# Patient Record
Sex: Female | Born: 1970
Health system: Southern US, Community
[De-identification: ages and names within clinical notes are randomized; demographics above are authoritative.]

## PROBLEM LIST (undated history)

## (undated) DIAGNOSIS — F419 Anxiety disorder, unspecified: Secondary | ICD-10-CM

## (undated) DIAGNOSIS — M62838 Other muscle spasm: Secondary | ICD-10-CM

## (undated) DIAGNOSIS — N946 Dysmenorrhea, unspecified: Secondary | ICD-10-CM

## (undated) DIAGNOSIS — F32A Depression, unspecified: Secondary | ICD-10-CM

## (undated) DIAGNOSIS — D649 Anemia, unspecified: Secondary | ICD-10-CM

## (undated) DIAGNOSIS — K219 Gastro-esophageal reflux disease without esophagitis: Secondary | ICD-10-CM

## (undated) DIAGNOSIS — J309 Allergic rhinitis, unspecified: Secondary | ICD-10-CM

## (undated) DIAGNOSIS — D219 Benign neoplasm of connective and other soft tissue, unspecified: Secondary | ICD-10-CM

## (undated) DIAGNOSIS — G5600 Carpal tunnel syndrome, unspecified upper limb: Secondary | ICD-10-CM

## (undated) DIAGNOSIS — G988 Other disorders of nervous system: Secondary | ICD-10-CM

## (undated) DIAGNOSIS — R51 Headache: Secondary | ICD-10-CM

## (undated) DIAGNOSIS — F329 Major depressive disorder, single episode, unspecified: Secondary | ICD-10-CM

## (undated) DIAGNOSIS — R0602 Shortness of breath: Secondary | ICD-10-CM

## (undated) DIAGNOSIS — E0590 Thyrotoxicosis, unspecified without thyrotoxic crisis or storm: Secondary | ICD-10-CM

## (undated) DIAGNOSIS — N920 Excessive and frequent menstruation with regular cycle: Secondary | ICD-10-CM

## (undated) HISTORY — DX: Carpal tunnel syndrome, unspecified upper limb: G56.00

## (undated) HISTORY — DX: Excessive and frequent menstruation with regular cycle: N92.0

## (undated) HISTORY — PX: THYROID LOBECTOMY: SHX420

## (undated) HISTORY — DX: Depression, unspecified: F32.A

## (undated) HISTORY — PX: CHOLECYSTECTOMY: SHX55

## (undated) HISTORY — DX: Dysmenorrhea, unspecified: N94.6

## (undated) HISTORY — PX: BREAST BIOPSY: SHX20

## (undated) HISTORY — DX: Other muscle spasm: M62.838

## (undated) HISTORY — DX: Allergic rhinitis, unspecified: J30.9

## (undated) HISTORY — DX: Anxiety disorder, unspecified: F41.9

## (undated) HISTORY — DX: Major depressive disorder, single episode, unspecified: F32.9

## (undated) HISTORY — DX: Benign neoplasm of connective and other soft tissue, unspecified: D21.9

---

## 2008-11-21 ENCOUNTER — Encounter: Payer: Self-pay | Admitting: Maternal and Fetal Medicine

## 2009-01-05 ENCOUNTER — Encounter: Payer: Self-pay | Admitting: Obstetrics and Gynecology

## 2009-03-30 ENCOUNTER — Observation Stay: Payer: Self-pay

## 2009-04-17 ENCOUNTER — Ambulatory Visit: Payer: Self-pay | Admitting: Obstetrics and Gynecology

## 2009-04-18 ENCOUNTER — Inpatient Hospital Stay: Payer: Self-pay

## 2011-08-31 DIAGNOSIS — E785 Hyperlipidemia, unspecified: Secondary | ICD-10-CM | POA: Insufficient documentation

## 2011-10-11 ENCOUNTER — Ambulatory Visit: Payer: Self-pay | Admitting: Internal Medicine

## 2011-11-25 ENCOUNTER — Ambulatory Visit: Payer: Self-pay | Admitting: Internal Medicine

## 2011-12-16 ENCOUNTER — Ambulatory Visit: Payer: Self-pay | Admitting: Surgery

## 2011-12-26 ENCOUNTER — Other Ambulatory Visit (HOSPITAL_COMMUNITY): Payer: Self-pay | Admitting: Neurology

## 2011-12-26 DIAGNOSIS — R32 Unspecified urinary incontinence: Secondary | ICD-10-CM

## 2011-12-26 DIAGNOSIS — R200 Anesthesia of skin: Secondary | ICD-10-CM

## 2012-01-10 ENCOUNTER — Encounter (HOSPITAL_COMMUNITY): Payer: Self-pay | Admitting: Pharmacy Technician

## 2012-01-14 ENCOUNTER — Encounter (HOSPITAL_COMMUNITY)
Admission: RE | Admit: 2012-01-14 | Discharge: 2012-01-14 | Disposition: A | Discharge status: Home / Self Care | Payer: Medicare HMO | Source: Ambulatory Visit | Attending: Anesthesiology | Admitting: Anesthesiology

## 2012-01-14 ENCOUNTER — Encounter (HOSPITAL_COMMUNITY): Payer: Self-pay

## 2012-01-14 ENCOUNTER — Encounter (HOSPITAL_COMMUNITY)
Admission: RE | Admit: 2012-01-14 | Discharge: 2012-01-14 | Disposition: A | Discharge status: Home / Self Care | Payer: Medicare HMO | Source: Ambulatory Visit | Attending: Neurology | Admitting: Neurology

## 2012-01-14 DIAGNOSIS — Z01812 Encounter for preprocedural laboratory examination: Secondary | ICD-10-CM | POA: Insufficient documentation

## 2012-01-14 DIAGNOSIS — Z01818 Encounter for other preprocedural examination: Secondary | ICD-10-CM | POA: Insufficient documentation

## 2012-01-14 HISTORY — DX: Anemia, unspecified: D64.9

## 2012-01-14 HISTORY — DX: Thyrotoxicosis, unspecified without thyrotoxic crisis or storm: E05.90

## 2012-01-14 HISTORY — DX: Anxiety disorder, unspecified: F41.9

## 2012-01-14 HISTORY — DX: Depression, unspecified: F32.A

## 2012-01-14 HISTORY — DX: Gastro-esophageal reflux disease without esophagitis: K21.9

## 2012-01-14 HISTORY — DX: Shortness of breath: R06.02

## 2012-01-14 HISTORY — DX: Major depressive disorder, single episode, unspecified: F32.9

## 2012-01-14 LAB — CBC
Hemoglobin: 13.6 g/dL (ref 12.0–15.0)
MCH: 27.5 pg (ref 26.0–34.0)
RBC: 4.95 MIL/uL (ref 3.87–5.11)

## 2012-01-14 NOTE — Pre-Procedure Instructions (Signed)
[address] AMADI YOSHINO  01/14/2012   Your procedure is scheduled on: 01-16-2012  @ 11:00 AM  Report to [facility] at 9:00 AM.  Call this number if you have problems the morning of surgery: [phone]   Remember:   Do not eat food:After Midnight.  May have clear liquids: up to 4 Hours before arrival.  Clear liquids include soda, tea, black coffee, apple or grape juice, broth.5:00AM  Take these medicines the morning of surgery with A SIP OF WATER clonazepam,omeprazole   Do not wear jewelry, make-up or nail polish.  Do not wear lotions, powders, or perfumes. You may wear deodorant.  Do not shave 48 hours prior to surgery.  Do not bring valuables to the hospital.  Contacts, dentures or bridgework may not be worn into surgery.  Leave suitcase in the car. After surgery it may be brought to your room.  For patients admitted to the hospital, checkout time is 11:00 AM the day of discharge.   Patients discharged the day of surgery will not be allowed to drive home.  Name and phone number of your driver:Angela Conner   sister

## 2012-01-15 ENCOUNTER — Encounter (HOSPITAL_COMMUNITY): Payer: Self-pay | Admitting: *Deleted

## 2012-01-16 ENCOUNTER — Inpatient Hospital Stay (HOSPITAL_COMMUNITY)
Admission: RE | Admit: 2012-01-16 | Discharge: 2012-01-16 | Payer: Self-pay | Source: Ambulatory Visit | Attending: Neurology | Admitting: Neurology

## 2012-01-16 ENCOUNTER — Other Ambulatory Visit (HOSPITAL_COMMUNITY): Payer: Self-pay

## 2012-01-16 ENCOUNTER — Ambulatory Visit: Admit: 2012-01-16 | Payer: Self-pay | Admitting: General Surgery

## 2012-01-16 ENCOUNTER — Ambulatory Visit (HOSPITAL_COMMUNITY): Admission: RE | Admit: 2012-01-16 | Payer: Medicare HMO | Source: Ambulatory Visit | Admitting: Neurology

## 2012-01-16 ENCOUNTER — Encounter (HOSPITAL_COMMUNITY): Payer: Self-pay | Admitting: *Deleted

## 2012-01-16 ENCOUNTER — Encounter (HOSPITAL_COMMUNITY): Admission: RE | Payer: Self-pay | Source: Ambulatory Visit

## 2012-01-16 SURGERY — LAPAROTOMY, EXPLORATORY
Anesthesia: General

## 2012-01-16 SURGERY — RADIOLOGY WITH ANESTHESIA
Anesthesia: General

## 2012-01-16 NOTE — Preoperative (Signed)
Beta Blockers   Reason not to administer Beta Blockers:Not Applicable 

## 2012-01-29 ENCOUNTER — Ambulatory Visit: Payer: Self-pay | Admitting: Internal Medicine

## 2012-02-03 ENCOUNTER — Other Ambulatory Visit (HOSPITAL_COMMUNITY): Payer: Self-pay

## 2012-02-04 ENCOUNTER — Other Ambulatory Visit (HOSPITAL_COMMUNITY): Payer: Medicare HMO

## 2012-02-05 ENCOUNTER — Encounter (HOSPITAL_COMMUNITY)
Admission: RE | Admit: 2012-02-05 | Discharge: 2012-02-05 | Disposition: A | Discharge status: Home / Self Care | Payer: Medicare HMO | Source: Ambulatory Visit | Attending: Neurology | Admitting: Neurology

## 2012-02-05 ENCOUNTER — Encounter (HOSPITAL_COMMUNITY): Payer: Self-pay

## 2012-02-05 DIAGNOSIS — Z01812 Encounter for preprocedural laboratory examination: Secondary | ICD-10-CM | POA: Insufficient documentation

## 2012-02-05 DIAGNOSIS — Z538 Procedure and treatment not carried out for other reasons: Secondary | ICD-10-CM | POA: Insufficient documentation

## 2012-02-05 LAB — CBC
Hemoglobin: 13.6 g/dL (ref 12.0–15.0)
MCH: 27.5 pg (ref 26.0–34.0)
MCV: 83.6 fL (ref 78.0–100.0)
Platelets: 295 10*3/uL (ref 150–400)
RBC: 4.94 MIL/uL (ref 3.87–5.11)
WBC: 11.5 10*3/uL — ABNORMAL HIGH (ref 4.0–10.5)

## 2012-02-05 LAB — HCG, SERUM, QUALITATIVE: Preg, Serum: NEGATIVE

## 2012-02-06 ENCOUNTER — Inpatient Hospital Stay (HOSPITAL_COMMUNITY)
Admission: RE | Admit: 2012-02-06 | Discharge: 2012-02-06 | Payer: Medicare HMO | Source: Ambulatory Visit | Attending: Neurology | Admitting: Neurology

## 2012-02-06 ENCOUNTER — Other Ambulatory Visit (HOSPITAL_COMMUNITY): Payer: Medicare HMO

## 2012-02-06 HISTORY — DX: Headache: R51

## 2012-02-06 HISTORY — DX: Other disorders of nervous system: G98.8

## 2012-11-11 ENCOUNTER — Ambulatory Visit: Payer: Self-pay | Admitting: Obstetrics and Gynecology

## 2012-11-11 LAB — URINALYSIS, COMPLETE
Glucose,UR: NEGATIVE mg/dL (ref 0–75)
Ketone: NEGATIVE
Nitrite: NEGATIVE
Specific Gravity: 1.016 (ref 1.003–1.030)
WBC UR: 1 /HPF (ref 0–5)

## 2012-11-11 LAB — PREGNANCY, URINE: Pregnancy Test, Urine: NEGATIVE m[IU]/mL

## 2012-11-11 LAB — CBC
HGB: 14.3 g/dL (ref 12.0–16.0)
MCH: 29 pg (ref 26.0–34.0)
MCHC: 34 g/dL (ref 32.0–36.0)
MCV: 85 fL (ref 80–100)
RBC: 4.92 10*6/uL (ref 3.80–5.20)

## 2012-12-22 DIAGNOSIS — N912 Amenorrhea, unspecified: Secondary | ICD-10-CM | POA: Insufficient documentation

## 2013-01-13 DIAGNOSIS — R197 Diarrhea, unspecified: Secondary | ICD-10-CM | POA: Insufficient documentation

## 2013-01-13 DIAGNOSIS — R1110 Vomiting, unspecified: Secondary | ICD-10-CM | POA: Insufficient documentation

## 2013-01-15 ENCOUNTER — Other Ambulatory Visit: Payer: Self-pay | Admitting: Nurse Practitioner

## 2013-01-16 LAB — CLOSTRIDIUM DIFFICILE BY PCR

## 2013-03-11 DIAGNOSIS — R319 Hematuria, unspecified: Secondary | ICD-10-CM | POA: Insufficient documentation

## 2013-03-11 DIAGNOSIS — N390 Urinary tract infection, site not specified: Secondary | ICD-10-CM | POA: Insufficient documentation

## 2013-07-06 ENCOUNTER — Ambulatory Visit: Payer: Self-pay | Admitting: Internal Medicine

## 2013-07-23 DIAGNOSIS — J309 Allergic rhinitis, unspecified: Secondary | ICD-10-CM | POA: Insufficient documentation

## 2013-07-23 DIAGNOSIS — R062 Wheezing: Secondary | ICD-10-CM | POA: Insufficient documentation

## 2013-11-18 ENCOUNTER — Ambulatory Visit: Payer: Self-pay | Admitting: Neurology

## 2014-05-26 LAB — HM PAP SMEAR: HM Pap smear: NEGATIVE

## 2014-08-18 ENCOUNTER — Ambulatory Visit: Payer: Self-pay | Admitting: Obstetrics and Gynecology

## 2014-08-18 LAB — HM MAMMOGRAPHY

## 2014-08-23 DIAGNOSIS — G44219 Episodic tension-type headache, not intractable: Secondary | ICD-10-CM | POA: Insufficient documentation

## 2015-01-16 DIAGNOSIS — N920 Excessive and frequent menstruation with regular cycle: Secondary | ICD-10-CM | POA: Diagnosis not present

## 2015-02-07 DIAGNOSIS — F331 Major depressive disorder, recurrent, moderate: Secondary | ICD-10-CM | POA: Diagnosis not present

## 2015-03-06 DIAGNOSIS — R5382 Chronic fatigue, unspecified: Secondary | ICD-10-CM | POA: Diagnosis not present

## 2015-03-06 DIAGNOSIS — R5381 Other malaise: Secondary | ICD-10-CM | POA: Diagnosis not present

## 2015-03-06 DIAGNOSIS — K219 Gastro-esophageal reflux disease without esophagitis: Secondary | ICD-10-CM | POA: Diagnosis not present

## 2015-03-14 DIAGNOSIS — F331 Major depressive disorder, recurrent, moderate: Secondary | ICD-10-CM | POA: Diagnosis not present

## 2015-04-04 DIAGNOSIS — N920 Excessive and frequent menstruation with regular cycle: Secondary | ICD-10-CM | POA: Diagnosis not present

## 2015-04-11 DIAGNOSIS — F331 Major depressive disorder, recurrent, moderate: Secondary | ICD-10-CM | POA: Diagnosis not present

## 2015-04-13 DIAGNOSIS — F329 Major depressive disorder, single episode, unspecified: Secondary | ICD-10-CM | POA: Diagnosis not present

## 2015-04-13 DIAGNOSIS — R635 Abnormal weight gain: Secondary | ICD-10-CM | POA: Diagnosis not present

## 2015-04-13 DIAGNOSIS — F419 Anxiety disorder, unspecified: Secondary | ICD-10-CM | POA: Diagnosis not present

## 2015-04-13 DIAGNOSIS — G5600 Carpal tunnel syndrome, unspecified upper limb: Secondary | ICD-10-CM | POA: Diagnosis not present

## 2015-04-20 DIAGNOSIS — R350 Frequency of micturition: Secondary | ICD-10-CM | POA: Diagnosis not present

## 2015-04-20 DIAGNOSIS — G44211 Episodic tension-type headache, intractable: Secondary | ICD-10-CM | POA: Diagnosis not present

## 2015-04-20 DIAGNOSIS — G5601 Carpal tunnel syndrome, right upper limb: Secondary | ICD-10-CM | POA: Diagnosis not present

## 2015-04-20 DIAGNOSIS — G5602 Carpal tunnel syndrome, left upper limb: Secondary | ICD-10-CM | POA: Diagnosis not present

## 2015-05-12 DIAGNOSIS — G44211 Episodic tension-type headache, intractable: Secondary | ICD-10-CM | POA: Diagnosis not present

## 2015-05-19 DIAGNOSIS — G5602 Carpal tunnel syndrome, left upper limb: Secondary | ICD-10-CM | POA: Diagnosis not present

## 2015-05-19 DIAGNOSIS — M5481 Occipital neuralgia: Secondary | ICD-10-CM | POA: Diagnosis not present

## 2015-05-19 DIAGNOSIS — G44211 Episodic tension-type headache, intractable: Secondary | ICD-10-CM | POA: Diagnosis not present

## 2015-05-19 DIAGNOSIS — G5601 Carpal tunnel syndrome, right upper limb: Secondary | ICD-10-CM | POA: Diagnosis not present

## 2015-05-23 DIAGNOSIS — F331 Major depressive disorder, recurrent, moderate: Secondary | ICD-10-CM | POA: Diagnosis not present

## 2015-06-08 DIAGNOSIS — F331 Major depressive disorder, recurrent, moderate: Secondary | ICD-10-CM | POA: Diagnosis not present

## 2015-06-19 DIAGNOSIS — R5383 Other fatigue: Secondary | ICD-10-CM | POA: Diagnosis not present

## 2015-06-19 DIAGNOSIS — L989 Disorder of the skin and subcutaneous tissue, unspecified: Secondary | ICD-10-CM | POA: Diagnosis not present

## 2015-06-19 DIAGNOSIS — L049 Acute lymphadenitis, unspecified: Secondary | ICD-10-CM | POA: Diagnosis not present

## 2015-06-19 DIAGNOSIS — J449 Chronic obstructive pulmonary disease, unspecified: Secondary | ICD-10-CM | POA: Diagnosis not present

## 2015-06-20 ENCOUNTER — Encounter: Payer: Self-pay | Admitting: Obstetrics and Gynecology

## 2015-06-20 ENCOUNTER — Ambulatory Visit (INDEPENDENT_AMBULATORY_CARE_PROVIDER_SITE_OTHER): Payer: Medicare Other | Admitting: Obstetrics and Gynecology

## 2015-06-20 VITALS — BP 113/75 | HR 111 | Ht 67.5 in | Wt 231.1 lb

## 2015-06-20 DIAGNOSIS — R638 Other symptoms and signs concerning food and fluid intake: Secondary | ICD-10-CM

## 2015-06-20 DIAGNOSIS — Z1231 Encounter for screening mammogram for malignant neoplasm of breast: Secondary | ICD-10-CM | POA: Diagnosis not present

## 2015-06-20 DIAGNOSIS — N921 Excessive and frequent menstruation with irregular cycle: Secondary | ICD-10-CM | POA: Diagnosis not present

## 2015-06-20 DIAGNOSIS — Z01419 Encounter for gynecological examination (general) (routine) without abnormal findings: Secondary | ICD-10-CM

## 2015-06-20 DIAGNOSIS — Z720 Tobacco use: Secondary | ICD-10-CM

## 2015-06-20 MED ORDER — MEDROXYPROGESTERONE ACETATE 150 MG/ML IM SUSP
150.0000 mg | Freq: Once | INTRAMUSCULAR | Status: AC
  Administered 2015-06-20: 150 mg via INTRAMUSCULAR

## 2015-06-20 MED ORDER — MEDROXYPROGESTERONE ACETATE 150 MG/ML IM SUSP: 150.0000 mg | INTRAMUSCULAR | Status: DC

## 2015-06-20 NOTE — Progress Notes (Signed)
Patient ID: Mary Koch, female   DOB: 1971/04/28, 44 y.o.   MRN: [id number]  ANNUAL PREVENTATIVE CARE GYN  ENCOUNTER NOTE  Subjective:       Mary Koch is a 44 y.o. No obstetric history on file. female here for a routine annual gynecologic exam.  Current complaints: 1.  none   Gynecologic History No LMP recorded. Patient has had an injection. Contraception: tubal ligation Last Pap: 05/26/2014. Results were: normal Last mammogram: 08/18/2014. Results were: normal  Obstetric History OB History  No data available    Past Medical History  Diagnosis Date  . Shortness of breath     when i smoke a lot  . Anxiety   . Depression   . GERD (gastroesophageal reflux disease)   . Anemia   . Neurological disorder     evaluation for ms  . Headache(784.0)   . Headache(784.0)   . Anxiety and depression   . Painful menstrual periods   . Hyperthyroidism     right portion of thyroid removed  . Heavy periods   . Allergic rhinitis   . Carpal tunnel syndrome   . Muscle spasm   . Fibroids     Past Surgical History  Procedure Laterality Date  . Thyroid lobectomy    . Cesarean section      times 2  . Cholecystectomy      Current Outpatient Prescriptions on File Prior to Visit  Medication Sig Dispense Refill  . clonazePAM (KLONOPIN) 1 MG tablet Take 1 mg by mouth 2 (two) times daily.    . cyclobenzaprine (FLEXERIL) 10 MG tablet Take 10 mg by mouth 3 (three) times daily as needed for muscle spasms.    Marland Kitchen gabapentin (NEURONTIN) 600 MG tablet Take 600 mg by mouth 3 (three) times daily.    . Garcinia Cambogia-Chromium 500-200 MG-MCG TABS Take 1 tablet by mouth daily.    Marland Kitchen loratadine (CLARITIN) 10 MG tablet Take 10 mg by mouth daily.    . naproxen (NAPROSYN) 500 MG tablet Take 500 mg by mouth 2 (two) times daily with a meal.    . omeprazole (PRILOSEC) 40 MG capsule Take 40 mg by mouth daily.    . QUEtiapine (SEROQUEL) 25 MG tablet Take 25 mg by mouth at bedtime.    . sertraline  (ZOLOFT) 100 MG tablet Take 100 mg by mouth daily.    Marland Kitchen tiZANidine (ZANAFLEX) 4 MG capsule Take 4 mg by mouth at bedtime.    . vitamin E (VITAMIN E) 400 UNIT capsule Take 800 Units by mouth daily.     No current facility-administered medications on file prior to visit.    Allergies  Allergen Reactions  . Azithromycin Other (See Comments)    headache  . Codeine Other (See Comments)    unknown  . Penicillins Other (See Comments)    headaches    History   Social History  . Marital Status: Legally Separated    Spouse Name: N/A  . Number of Children: N/A  . Years of Education: N/A   Occupational History  . Not on file.   Social History Main Topics  . Smoking status: Current Every Day Smoker -- 2.00 packs/day for 25 years    Types: Cigarettes  . Smokeless tobacco: Not on file  . Alcohol Use: No  . Drug Use: No  . Sexual Activity: Yes    Birth Control/ Protection: Surgical   Other Topics Concern  . Not on file   Social History Narrative  No family history on file.  The following portions of the patient's history were reviewed and updated as appropriate: allergies, current medications, past family history, past medical history, past social history, past surgical history and problem list.  Review of Systems ROS Review of Systems - General ROS: negative for - chills, fatigue, fever, hot flashes, night sweats, weight gain or weight loss Psychological ROS: negative for - anxiety, decreased libido, depression, mood swings, physical abuse or sexual abuse Ophthalmic ROS: negative for - blurry vision, eye pain or loss of vision ENT ROS: negative for - headaches, hearing change, visual changes or vocal changes Allergy and Immunology ROS: negative for - hives, itchy/watery eyes or seasonal allergies Hematological and Lymphatic ROS: negative for - bleeding problems, bruising, swollen lymph nodes or weight loss Endocrine ROS: negative for - galactorrhea, hair pattern changes, hot  flashes, malaise/lethargy, mood swings, palpitations, polydipsia/polyuria, skin changes, temperature intolerance or unexpected weight changes Breast ROS: negative for - new or changing breast lumps or nipple discharge Respiratory ROS: negative for - cough or shortness of breath Cardiovascular ROS: negative for - chest pain, irregular heartbeat, palpitations or shortness of breath Gastrointestinal ROS: no abdominal pain, change in bowel habits, or black or bloody stools Genito-Urinary ROS: no dysuria, trouble voiding, or hematuria Musculoskeletal ROS: negative for - joint pain or joint stiffness Neurological ROS: negative for - bowel and bladder control changes Dermatological ROS: negative for rash and skin lesion changes   Objective:   BP 113/75 mmHg  Pulse 111  Ht 5' 7.5" (1.715 m)  Wt 231 lb 1.6 oz (104.826 kg)  BMI 35.64 kg/m2 CONSTITUTIONAL: Well-developed, well-nourished female in no acute distress.  PSYCHIATRIC: Normal mood and affect. Normal behavior. Normal judgment and thought content. [place]: Alert and oriented to person, place, and time. Normal muscle tone coordination. No cranial nerve deficit noted. HENT:  Normocephalic, atraumatic, External right and left ear normal. Oropharynx is clear and moist EYES: Conjunctivae and EOM are normal. Pupils are equal, round, and reactive to light. No scleral icterus.  NECK: Normal range of motion, supple, no masses.  Normal thyroid.  SKIN: Skin is warm and dry. No rash noted. Not diaphoretic. No erythema. No pallor. CARDIOVASCULAR: Normal heart rate noted, regular rhythm, no murmur. RESPIRATORY: Clear to auscultation bilaterally. Effort and breath sounds normal, no problems with respiration noted. BREASTS: Symmetric in size. No masses, skin changes, nipple drainage, or lymphadenopathy. ABDOMEN: Soft, normal bowel sounds, no distention noted.  No tenderness, rebound or guarding.  BLADDER: Normal PELVIC:  External Genitalia:  Normal  BUS: Normal  Vagina: Normal  Cervix: Normal  Uterus: Normal  Adnexa: Normal  RV: External Exam NormaI, No Rectal Masses and Normal Sphincter tone  MUSCULOSKELETAL: Normal range of motion. No tenderness.  No cyanosis, clubbing, or edema.  2+ distal pulses. LYMPHATIC: No Axillary, Supraclavicular, or Inguinal Adenopathy.    Assessment:   Annual gynecologic examination 44 y.o. Contraception: tubal ligation Increased BMI Tobacco User Anxiety-controlled  Plan:  Pap: Co-test Mammogram: ordered Stool Guaiac Testing: not indicated Labs: per pcp Routine preventative health maintenance measures emphasized: Exercise/diet/wt loss encouraged. Smoking Cessation encouraged.  Continue Depo provera for cycle suppression. Return to [place], [place]   Brayton Mars, MD

## 2015-06-25 ENCOUNTER — Encounter: Payer: Self-pay | Admitting: Obstetrics and Gynecology

## 2015-06-25 DIAGNOSIS — N6019 Diffuse cystic mastopathy of unspecified breast: Secondary | ICD-10-CM | POA: Insufficient documentation

## 2015-06-25 DIAGNOSIS — F17200 Nicotine dependence, unspecified, uncomplicated: Secondary | ICD-10-CM | POA: Insufficient documentation

## 2015-06-25 DIAGNOSIS — K219 Gastro-esophageal reflux disease without esophagitis: Secondary | ICD-10-CM | POA: Insufficient documentation

## 2015-06-25 DIAGNOSIS — G5600 Carpal tunnel syndrome, unspecified upper limb: Secondary | ICD-10-CM | POA: Insufficient documentation

## 2015-06-25 DIAGNOSIS — E079 Disorder of thyroid, unspecified: Secondary | ICD-10-CM | POA: Insufficient documentation

## 2015-06-25 DIAGNOSIS — D219 Benign neoplasm of connective and other soft tissue, unspecified: Secondary | ICD-10-CM | POA: Insufficient documentation

## 2015-06-25 DIAGNOSIS — E669 Obesity, unspecified: Secondary | ICD-10-CM | POA: Insufficient documentation

## 2015-06-25 DIAGNOSIS — N920 Excessive and frequent menstruation with regular cycle: Secondary | ICD-10-CM | POA: Insufficient documentation

## 2015-06-25 DIAGNOSIS — F329 Major depressive disorder, single episode, unspecified: Secondary | ICD-10-CM | POA: Insufficient documentation

## 2015-06-25 DIAGNOSIS — F419 Anxiety disorder, unspecified: Secondary | ICD-10-CM | POA: Insufficient documentation

## 2015-06-25 DIAGNOSIS — Z3009 Encounter for other general counseling and advice on contraception: Secondary | ICD-10-CM | POA: Insufficient documentation

## 2015-06-25 DIAGNOSIS — Z6841 Body Mass Index (BMI) 40.0 and over, adult: Secondary | ICD-10-CM | POA: Insufficient documentation

## 2015-06-25 DIAGNOSIS — R638 Other symptoms and signs concerning food and fluid intake: Secondary | ICD-10-CM | POA: Insufficient documentation

## 2015-06-25 DIAGNOSIS — N946 Dysmenorrhea, unspecified: Secondary | ICD-10-CM | POA: Insufficient documentation

## 2015-06-26 DIAGNOSIS — K219 Gastro-esophageal reflux disease without esophagitis: Secondary | ICD-10-CM | POA: Diagnosis not present

## 2015-07-10 DIAGNOSIS — F331 Major depressive disorder, recurrent, moderate: Secondary | ICD-10-CM | POA: Diagnosis not present

## 2015-07-25 DIAGNOSIS — F331 Major depressive disorder, recurrent, moderate: Secondary | ICD-10-CM | POA: Diagnosis not present

## 2015-08-11 DIAGNOSIS — S63512S Sprain of carpal joint of left wrist, sequela: Secondary | ICD-10-CM | POA: Diagnosis not present

## 2015-08-11 DIAGNOSIS — F419 Anxiety disorder, unspecified: Secondary | ICD-10-CM | POA: Diagnosis not present

## 2015-08-11 DIAGNOSIS — F329 Major depressive disorder, single episode, unspecified: Secondary | ICD-10-CM | POA: Diagnosis not present

## 2015-08-11 DIAGNOSIS — G5600 Carpal tunnel syndrome, unspecified upper limb: Secondary | ICD-10-CM | POA: Diagnosis not present

## 2015-08-15 DIAGNOSIS — F331 Major depressive disorder, recurrent, moderate: Secondary | ICD-10-CM | POA: Diagnosis not present

## 2015-08-28 DIAGNOSIS — G5602 Carpal tunnel syndrome, left upper limb: Secondary | ICD-10-CM | POA: Diagnosis not present

## 2015-08-28 DIAGNOSIS — G5601 Carpal tunnel syndrome, right upper limb: Secondary | ICD-10-CM | POA: Diagnosis not present

## 2015-08-29 DIAGNOSIS — F331 Major depressive disorder, recurrent, moderate: Secondary | ICD-10-CM | POA: Diagnosis not present

## 2015-09-05 ENCOUNTER — Ambulatory Visit (INDEPENDENT_AMBULATORY_CARE_PROVIDER_SITE_OTHER): Payer: Commercial Managed Care - HMO | Admitting: Obstetrics and Gynecology

## 2015-09-05 VITALS — BP 115/76 | HR 96 | Ht 67.5 in | Wt 237.0 lb

## 2015-09-05 DIAGNOSIS — N921 Excessive and frequent menstruation with irregular cycle: Secondary | ICD-10-CM | POA: Diagnosis not present

## 2015-09-05 MED ORDER — MEDROXYPROGESTERONE ACETATE 150 MG/ML IM SUSP
150.0000 mg | Freq: Once | INTRAMUSCULAR | Status: AC
  Administered 2015-09-05: 150 mg via INTRAMUSCULAR

## 2015-09-05 NOTE — Progress Notes (Signed)
Patient ID: Mary Koch, female   DOB: [birth year], 44 y.o.   MRN: [id number]  Pt presents for depo-provera injection for cyclic suppression. Pt happy with this, no vaginal bleeding. No c/o undesired side effects of medication.

## 2015-09-15 DIAGNOSIS — G5602 Carpal tunnel syndrome, left upper limb: Secondary | ICD-10-CM | POA: Diagnosis not present

## 2015-09-29 DIAGNOSIS — F329 Major depressive disorder, single episode, unspecified: Secondary | ICD-10-CM | POA: Diagnosis not present

## 2015-09-29 DIAGNOSIS — E669 Obesity, unspecified: Secondary | ICD-10-CM | POA: Diagnosis not present

## 2015-09-29 DIAGNOSIS — F419 Anxiety disorder, unspecified: Secondary | ICD-10-CM | POA: Diagnosis not present

## 2015-10-02 DIAGNOSIS — Z23 Encounter for immunization: Secondary | ICD-10-CM | POA: Diagnosis not present

## 2015-10-19 DIAGNOSIS — E8881 Metabolic syndrome: Secondary | ICD-10-CM | POA: Diagnosis not present

## 2015-10-19 DIAGNOSIS — E663 Overweight: Secondary | ICD-10-CM | POA: Diagnosis not present

## 2015-11-09 DIAGNOSIS — F209 Schizophrenia, unspecified: Secondary | ICD-10-CM | POA: Diagnosis not present

## 2015-11-21 ENCOUNTER — Ambulatory Visit (INDEPENDENT_AMBULATORY_CARE_PROVIDER_SITE_OTHER): Payer: Commercial Managed Care - HMO

## 2015-11-21 VITALS — BP 103/81 | HR 95 | Ht 67.5 in | Wt 232.3 lb

## 2015-11-21 DIAGNOSIS — N921 Excessive and frequent menstruation with irregular cycle: Secondary | ICD-10-CM | POA: Diagnosis not present

## 2015-11-21 MED ORDER — MEDROXYPROGESTERONE ACETATE 150 MG/ML IM SUSP
150.0000 mg | Freq: Once | INTRAMUSCULAR | Status: AC
  Administered 2015-11-21: 150 mg via INTRAMUSCULAR

## 2015-11-30 DIAGNOSIS — E8881 Metabolic syndrome: Secondary | ICD-10-CM | POA: Diagnosis not present

## 2015-11-30 DIAGNOSIS — F329 Major depressive disorder, single episode, unspecified: Secondary | ICD-10-CM | POA: Diagnosis not present

## 2015-11-30 DIAGNOSIS — E669 Obesity, unspecified: Secondary | ICD-10-CM | POA: Diagnosis not present

## 2015-11-30 DIAGNOSIS — F419 Anxiety disorder, unspecified: Secondary | ICD-10-CM | POA: Diagnosis not present

## 2015-12-01 DIAGNOSIS — F320 Major depressive disorder, single episode, mild: Secondary | ICD-10-CM | POA: Diagnosis not present

## 2015-12-01 DIAGNOSIS — K219 Gastro-esophageal reflux disease without esophagitis: Secondary | ICD-10-CM | POA: Diagnosis not present

## 2015-12-01 DIAGNOSIS — Z6835 Body mass index (BMI) 35.0-35.9, adult: Secondary | ICD-10-CM | POA: Diagnosis not present

## 2015-12-01 DIAGNOSIS — E782 Mixed hyperlipidemia: Secondary | ICD-10-CM | POA: Diagnosis not present

## 2015-12-01 DIAGNOSIS — Z0000 Encounter for general adult medical examination without abnormal findings: Secondary | ICD-10-CM | POA: Diagnosis not present

## 2015-12-01 DIAGNOSIS — G43909 Migraine, unspecified, not intractable, without status migrainosus: Secondary | ICD-10-CM | POA: Diagnosis not present

## 2015-12-01 DIAGNOSIS — J45909 Unspecified asthma, uncomplicated: Secondary | ICD-10-CM | POA: Diagnosis not present

## 2015-12-07 DIAGNOSIS — F331 Major depressive disorder, recurrent, moderate: Secondary | ICD-10-CM | POA: Diagnosis not present

## 2015-12-07 DIAGNOSIS — F410 Panic disorder [episodic paroxysmal anxiety] without agoraphobia: Secondary | ICD-10-CM | POA: Diagnosis not present

## 2015-12-31 HISTORY — PX: OTHER SURGICAL HISTORY: SHX169

## 2016-01-02 DIAGNOSIS — F419 Anxiety disorder, unspecified: Secondary | ICD-10-CM | POA: Diagnosis not present

## 2016-01-02 DIAGNOSIS — F329 Major depressive disorder, single episode, unspecified: Secondary | ICD-10-CM | POA: Diagnosis not present

## 2016-01-02 DIAGNOSIS — R635 Abnormal weight gain: Secondary | ICD-10-CM | POA: Diagnosis not present

## 2016-01-02 DIAGNOSIS — E785 Hyperlipidemia, unspecified: Secondary | ICD-10-CM | POA: Diagnosis not present

## 2016-01-22 DIAGNOSIS — F331 Major depressive disorder, recurrent, moderate: Secondary | ICD-10-CM | POA: Diagnosis not present

## 2016-01-30 DIAGNOSIS — F331 Major depressive disorder, recurrent, moderate: Secondary | ICD-10-CM | POA: Diagnosis not present

## 2016-01-30 DIAGNOSIS — F410 Panic disorder [episodic paroxysmal anxiety] without agoraphobia: Secondary | ICD-10-CM | POA: Diagnosis not present

## 2016-02-06 ENCOUNTER — Ambulatory Visit (INDEPENDENT_AMBULATORY_CARE_PROVIDER_SITE_OTHER): Payer: Commercial Managed Care - HMO

## 2016-02-06 VITALS — BP 126/86 | HR 111 | Wt 244.3 lb

## 2016-02-06 DIAGNOSIS — N921 Excessive and frequent menstruation with irregular cycle: Secondary | ICD-10-CM | POA: Diagnosis not present

## 2016-02-06 MED ORDER — MEDROXYPROGESTERONE ACETATE 150 MG/ML IM SUSP
150.0000 mg | Freq: Once | INTRAMUSCULAR | Status: AC
  Administered 2016-02-06: 150 mg via INTRAMUSCULAR

## 2016-02-06 NOTE — Progress Notes (Signed)
Patient ID: Mary Koch, female   DOB: 1971/11/05, 45 y.o.   MRN: [id number] Pt presents for medroxyprogesterone 150 mg injection for menorrhagia with irregular cycle. No menses "in a long time".  Denies any problems with medication. Pt weight gain from last visit was 12 lbs. Pt does not think it is the depo-provera since she has been on it for so long. Unsure if this may be related to eating habits.

## 2016-02-15 DIAGNOSIS — F331 Major depressive disorder, recurrent, moderate: Secondary | ICD-10-CM | POA: Diagnosis not present

## 2016-02-15 DIAGNOSIS — F410 Panic disorder [episodic paroxysmal anxiety] without agoraphobia: Secondary | ICD-10-CM | POA: Diagnosis not present

## 2016-02-19 DIAGNOSIS — F410 Panic disorder [episodic paroxysmal anxiety] without agoraphobia: Secondary | ICD-10-CM | POA: Diagnosis not present

## 2016-02-19 DIAGNOSIS — F331 Major depressive disorder, recurrent, moderate: Secondary | ICD-10-CM | POA: Diagnosis not present

## 2016-02-20 DIAGNOSIS — G43119 Migraine with aura, intractable, without status migrainosus: Secondary | ICD-10-CM | POA: Diagnosis not present

## 2016-02-20 DIAGNOSIS — G5603 Carpal tunnel syndrome, bilateral upper limbs: Secondary | ICD-10-CM | POA: Diagnosis not present

## 2016-03-05 DIAGNOSIS — F410 Panic disorder [episodic paroxysmal anxiety] without agoraphobia: Secondary | ICD-10-CM | POA: Diagnosis not present

## 2016-03-05 DIAGNOSIS — F331 Major depressive disorder, recurrent, moderate: Secondary | ICD-10-CM | POA: Diagnosis not present

## 2016-03-08 DIAGNOSIS — Z0000 Encounter for general adult medical examination without abnormal findings: Secondary | ICD-10-CM | POA: Diagnosis not present

## 2016-03-19 DIAGNOSIS — L700 Acne vulgaris: Secondary | ICD-10-CM | POA: Diagnosis not present

## 2016-03-20 DIAGNOSIS — F331 Major depressive disorder, recurrent, moderate: Secondary | ICD-10-CM | POA: Diagnosis not present

## 2016-04-04 DIAGNOSIS — F331 Major depressive disorder, recurrent, moderate: Secondary | ICD-10-CM | POA: Diagnosis not present

## 2016-04-05 DIAGNOSIS — F329 Major depressive disorder, single episode, unspecified: Secondary | ICD-10-CM | POA: Diagnosis not present

## 2016-04-05 DIAGNOSIS — R638 Other symptoms and signs concerning food and fluid intake: Secondary | ICD-10-CM | POA: Diagnosis not present

## 2016-05-07 ENCOUNTER — Ambulatory Visit: Payer: Commercial Managed Care - HMO | Admitting: Obstetrics and Gynecology

## 2016-05-07 ENCOUNTER — Ambulatory Visit (INDEPENDENT_AMBULATORY_CARE_PROVIDER_SITE_OTHER): Payer: Commercial Managed Care - HMO

## 2016-05-07 VITALS — BP 114/82 | HR 104 | Wt 247.0 lb

## 2016-05-07 DIAGNOSIS — N921 Excessive and frequent menstruation with irregular cycle: Secondary | ICD-10-CM | POA: Diagnosis not present

## 2016-05-07 MED ORDER — MEDROXYPROGESTERONE ACETATE 150 MG/ML IM SUSP
150.0000 mg | Freq: Once | INTRAMUSCULAR | Status: AC
  Administered 2016-05-07: 150 mg via INTRAMUSCULAR

## 2016-05-07 NOTE — Progress Notes (Signed)
Patient ID: Mary Koch, female   DOB: 1971-04-21, 45 y.o.   MRN: [id number] Pt presents for depo-provera injection. No c/o side effects. Medication is helping with her vaginal bleeding.

## 2016-05-14 DIAGNOSIS — F329 Major depressive disorder, single episode, unspecified: Secondary | ICD-10-CM | POA: Diagnosis not present

## 2016-05-14 DIAGNOSIS — F419 Anxiety disorder, unspecified: Secondary | ICD-10-CM | POA: Diagnosis not present

## 2016-05-14 DIAGNOSIS — J302 Other seasonal allergic rhinitis: Secondary | ICD-10-CM | POA: Diagnosis not present

## 2016-05-22 ENCOUNTER — Encounter: Payer: Self-pay | Admitting: Psychiatry

## 2016-05-22 ENCOUNTER — Ambulatory Visit (INDEPENDENT_AMBULATORY_CARE_PROVIDER_SITE_OTHER): Payer: Commercial Managed Care - HMO | Admitting: Psychiatry

## 2016-05-22 VITALS — BP 136/72 | HR 111 | Temp 97.8°F | Ht 67.5 in | Wt 243.0 lb

## 2016-05-22 DIAGNOSIS — F331 Major depressive disorder, recurrent, moderate: Secondary | ICD-10-CM

## 2016-05-22 DIAGNOSIS — F4001 Agoraphobia with panic disorder: Secondary | ICD-10-CM | POA: Diagnosis not present

## 2016-05-22 MED ORDER — TOPIRAMATE 50 MG PO TABS: 50.0000 mg | ORAL_TABLET | Freq: Every day | ORAL | Status: DC

## 2016-05-22 MED ORDER — TRAZODONE HCL 100 MG PO TABS: 100.0000 mg | ORAL_TABLET | Freq: Every evening | ORAL | Status: DC | PRN

## 2016-05-22 MED ORDER — DULOXETINE HCL 30 MG PO CPEP: 30.0000 mg | ORAL_CAPSULE | Freq: Two times a day (BID) | ORAL | Status: DC
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
## 2016-06-04 DIAGNOSIS — L700 Acne vulgaris: Secondary | ICD-10-CM | POA: Diagnosis not present

## 2016-06-06 ENCOUNTER — Ambulatory Visit (INDEPENDENT_AMBULATORY_CARE_PROVIDER_SITE_OTHER): Payer: Commercial Managed Care - HMO | Admitting: Psychiatry

## 2016-06-06 ENCOUNTER — Encounter: Payer: Self-pay | Admitting: Psychiatry

## 2016-06-06 VITALS — BP 119/83 | HR 92 | Temp 97.8°F | Ht 67.13 in | Wt 246.2 lb

## 2016-06-06 DIAGNOSIS — F4001 Agoraphobia with panic disorder: Secondary | ICD-10-CM | POA: Diagnosis not present

## 2016-06-06 DIAGNOSIS — F39 Unspecified mood [affective] disorder: Secondary | ICD-10-CM

## 2016-06-06 MED ORDER — TOPIRAMATE 100 MG PO TABS: 100.0000 mg | ORAL_TABLET | Freq: Every day | ORAL | Status: DC

## 2016-06-06 MED ORDER — DULOXETINE HCL 30 MG PO CPEP: 30.0000 mg | ORAL_CAPSULE | Freq: Two times a day (BID) | ORAL | Status: DC

## 2016-06-06 MED ORDER — QUETIAPINE FUMARATE 50 MG PO TABS: 50.0000 mg | ORAL_TABLET | Freq: Every day | ORAL | Status: DC

## 2016-06-06 NOTE — Progress Notes (Signed)
Psychiatric MD Progress Note   Patient Identification: Mary Koch MRN:  [id number] Date of Evaluation:  06/06/2016 Referral Source: [facility] Chief Complaint:   Visit Diagnosis:    ICD-9-CM ICD-10-CM   1. MDD (major depressive disorder), recurrent episode, moderate (HCC) 296.32 F33.1   2. Panic disorder with agoraphobia and mild panic attacks 300.21 F40.01     History of Present Illness:   Patient is a 45 year old female who is currently separated from  her husband presented for follow up.  She was referred from [facility]. Patient reported that she has decided to switch her services from [facility] as she was unhappy over there. She reported that she has been diagnosed with depression and panic attacks. She reported that she is currently prescribed CymbaltaTwice daily and reported that she continues to have stressful life situations. Her 43 year old is recuperating from his injury and she is supporting him. Patient reported that she currently lives with her parents and the 13-year-old who has autism. 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 Patient reported that she has not lost any weight from the Topamax but has actually gained 3 pounds. She is eating more. However she has noticed some distaste for this or does. She reported that she wants to continue taking her medications. We discussed about the medications in detail. She wants to have her medications adjusted at this time. Patient reported that she was unable to sleep at this trazodone and wants to start the Seroquel again. Patient appeared calm and collected during the interview. She reported that she is also interested in doing therapy with Elmyra Ricks. She has good relationship with her family members and is very supportive of her children. She has relationship issues with her ex-husband. We discussed that in detail as well. Patient currently denied having any suicidal ideations or  plans.  Associated Signs/Symptoms: Depression Symptoms:  depressed mood, anhedonia, psychomotor retardation, fatigue, feelings of worthlessness/guilt, hopelessness, anxiety, panic attacks, loss of energy/fatigue, disturbed sleep, weight gain, increased appetite, (Hypo) Manic Symptoms:  Flight of Ideas, Impulsivity, Irritable Mood, Labiality of Mood, Anxiety Symptoms:  Excessive Worry, Psychotic Symptoms:  none PTSD Symptoms: Negative NA  Past Psychiatric History:  H/o Suicide attempt age 69- OD Tylenol - Not admitted to hospital Following [facility] and was diagnosed with depression and panic disorder.   Previous Psychotropic Medications:  Paxil Luvox Klonopin zoloft seroquel  Substance Abuse History in the last 12 months:  No.  Consequences of Substance Abuse: Negative NA  Past Medical History:  Past Medical History  Diagnosis Date  . Shortness of breath     when i smoke a lot  . Anxiety   . Depression   . GERD (gastroesophageal reflux disease)   . Anemia   . Neurological disorder     evaluation for ms  . Headache(784.0)   . Headache(784.0)   . Anxiety and depression   . Painful menstrual periods   . Hyperthyroidism     right portion of thyroid removed  . Heavy periods   . Allergic rhinitis   . Carpal tunnel syndrome   . Muscle spasm   . Fibroids     Past Surgical History  Procedure Laterality Date  . Thyroid lobectomy    . Cesarean section      times 2  . Cholecystectomy      Family Psychiatric History:  Father- PTSD  Due to agent Orange exposure Brother- Alcoholic  No history of suicide attempts in the family  Family History:  Family History  Problem Relation Age of Onset  . Osteoporosis Mother   . Diabetes Father   . Anxiety disorder Father   . Depression Father   . Post-traumatic stress disorder Father   . Heart disease Father   . Breast cancer Neg Hx   . Colon cancer Neg Hx   . Anxiety disorder Sister    . Alcohol abuse Brother     Social History:   Social History   Social History  . Marital Status: Legally Separated    Spouse Name: N/A  . Number of Children: N/A  . Years of Education: N/A   Social History Main Topics  . Smoking status: Current Every Day Smoker -- 1.00 packs/day for 25 years    Types: Cigarettes    Start date: 05/22/1986  . Smokeless tobacco: Never Used  . Alcohol Use: No  . Drug Use: No  . Sexual Activity: Yes    Birth Control/ Protection: Surgical   Other Topics Concern  . None   Social History Narrative    Additional Social History:  Married in 2002. Seperated in 2013.  Living with  2 boys 72 and 24. 43 year old has been diagnosed with autism He pays child support and she is getting help from her family.  She is currently on disability   Allergies:   Allergies  Allergen Reactions  . Azithromycin Other (See Comments)    headache  . Codeine Other (See Comments)    unknown  . Penicillins Other (See Comments)    headaches    Metabolic Disorder Labs: No results found for: HGBA1C, MPG No results found for: PROLACTIN No results found for: CHOL, TRIG, HDL, CHOLHDL, VLDL, LDLCALC   Current Medications: Current Outpatient Prescriptions  Medication Sig Dispense Refill  . cyclobenzaprine (FLEXERIL) 10 MG tablet Take 10 mg by mouth 3 (three) times daily as needed for muscle spasms.    . DULoxetine (CYMBALTA) 30 MG capsule Take 1 capsule (30 mg total) by mouth 2 (two) times daily. 60 capsule 2  . fluticasone (FLONASE) 50 MCG/ACT nasal spray Place 1 spray into both nostrils daily.  4  . Fluticasone Furoate-Vilanterol (BREO ELLIPTA) 100-25 MCG/INH AEPB INHALE 1 PUFF BY MOUTH ONCE DAILY    . furosemide (LASIX) 20 MG tablet TAKE 1 TABLET BY MOUTH TWO TIMES A WEEK  0  . gabapentin (NEURONTIN) 600 MG tablet Take 600 mg by mouth 3 (three) times daily.    . meclizine (ANTIVERT) 12.5 MG tablet Take 12.5 mg by mouth daily.    . medroxyPROGESTERone  (DEPO-PROVERA) 150 MG/ML injection Inject 1 mL (150 mg total) into the muscle every 3 (three) months. 1 mL 3  . omeprazole (PRILOSEC) 40 MG capsule Take 40 mg by mouth daily.    . rosuvastatin (CRESTOR) 20 MG tablet Take 20 mg by mouth daily.    Marland Kitchen topiramate (TOPAMAX) 50 MG tablet Take 1 tablet (50 mg total) by mouth daily. 30 tablet 0  . traZODone (DESYREL) 100 MG tablet Take 1 tablet (100 mg total) by mouth at bedtime as needed for sleep. 30 tablet 0  . VENTOLIN HFA 108 (90 BASE) MCG/ACT inhaler INHALE 1 TO 2 PUFFS DAILY  5   No current facility-administered medications for this visit.    Neurologic: Headache: Yes Seizure: No Paresthesias:No  Musculoskeletal: Strength & Muscle Tone: within normal limits Gait & Station: normal Patient leans: N/A  Psychiatric Specialty Exam: ROS   Blood pressure 119/83, pulse 92, temperature 97.8 F (36.6  C), height 5' 7.13" (1.705 m), weight 246 lb 3.2 oz (111.676 kg), SpO2 97 %.Body mass index is 38.42 kg/(m^2).  General Appearance: Casual and Disheveled  Eye Contact:  Fair  Speech:  Clear and Coherent and Normal Rate  Volume:  Normal  Mood:  Dysphoric  Affect:  Congruent  Thought Process:  Goal Directed  Orientation:  Full (Time, Place, and Person)  Thought Content:  WDL  Suicidal Thoughts:  No  Homicidal Thoughts:  No  Memory:  Immediate;   Fair Recent;   Fair Remote;   Fair  Judgement:  Fair  Insight:  Good  Psychomotor Activity:  Normal  Concentration:  Concentration: Fair and Attention Span: Fair  Recall:  [place]: Fair  Akathisia:  No  Handed:  Right  AIMS (if indicated):    Assets:  Communication Skills Desire for Improvement Intimacy Physical Health Social Support Transportation  ADL's:  Intact  Cognition: WNL  Sleep:  6    Treatment Plan Summary: Medication management   Discussed with patient at length about the medications treatment risk benefits and alternatives Start Seroquel  50 mg by mouth daily at bedtime I will start her on Topamax 100  mg by mouth daily to help with her weight loss Continue Cymbalta 30 mg by mouth twice a day D/c Trazodone.  Follow-up in 6 weeks.    More than 50% of the time spent in psychoeducation, counseling and coordination of care.    This note was generated in part or whole with voice recognition software. Voice regonition is usually quite accurate but there are transcription errors that can and very often do occur. I apologize for any typographical errors that were not detected and corrected.    Rainey Pines, MD 6/8/20179:39 AM

## 2016-06-11 DIAGNOSIS — R12 Heartburn: Secondary | ICD-10-CM | POA: Diagnosis not present

## 2016-06-11 DIAGNOSIS — F329 Major depressive disorder, single episode, unspecified: Secondary | ICD-10-CM | POA: Diagnosis not present

## 2016-06-11 DIAGNOSIS — E669 Obesity, unspecified: Secondary | ICD-10-CM | POA: Diagnosis not present

## 2016-06-11 DIAGNOSIS — F419 Anxiety disorder, unspecified: Secondary | ICD-10-CM | POA: Diagnosis not present

## 2016-06-19 ENCOUNTER — Ambulatory Visit (INDEPENDENT_AMBULATORY_CARE_PROVIDER_SITE_OTHER): Payer: Commercial Managed Care - HMO | Admitting: Licensed Clinical Social Worker

## 2016-06-19 DIAGNOSIS — F4001 Agoraphobia with panic disorder: Secondary | ICD-10-CM | POA: Diagnosis not present

## 2016-06-19 DIAGNOSIS — F39 Unspecified mood [affective] disorder: Secondary | ICD-10-CM
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
## 2016-06-25 ENCOUNTER — Encounter: Payer: Medicare Other | Admitting: Obstetrics and Gynecology

## 2016-07-03 ENCOUNTER — Ambulatory Visit (INDEPENDENT_AMBULATORY_CARE_PROVIDER_SITE_OTHER): Payer: Commercial Managed Care - HMO | Admitting: Licensed Clinical Social Worker

## 2016-07-03 DIAGNOSIS — F331 Major depressive disorder, recurrent, moderate: Secondary | ICD-10-CM | POA: Diagnosis not present

## 2016-07-18 ENCOUNTER — Encounter: Payer: Self-pay | Admitting: Psychiatry

## 2016-07-18 ENCOUNTER — Ambulatory Visit (INDEPENDENT_AMBULATORY_CARE_PROVIDER_SITE_OTHER): Payer: Commercial Managed Care - HMO | Admitting: Psychiatry

## 2016-07-18 VITALS — BP 123/77 | HR 87 | Temp 98.4°F | Ht 67.0 in | Wt 240.0 lb

## 2016-07-18 DIAGNOSIS — F4001 Agoraphobia with panic disorder: Secondary | ICD-10-CM | POA: Diagnosis not present

## 2016-07-18 DIAGNOSIS — F39 Unspecified mood [affective] disorder: Secondary | ICD-10-CM | POA: Diagnosis not present

## 2016-07-18 MED ORDER — DULOXETINE HCL 30 MG PO CPEP: 30.0000 mg | ORAL_CAPSULE | Freq: Two times a day (BID) | ORAL | Status: DC

## 2016-07-18 MED ORDER — GABAPENTIN 600 MG PO TABS: 600.0000 mg | ORAL_TABLET | Freq: Three times a day (TID) | ORAL | Status: DC

## 2016-07-18 MED ORDER — QUETIAPINE FUMARATE 50 MG PO TABS: 50.0000 mg | ORAL_TABLET | Freq: Every day | ORAL | Status: DC

## 2016-07-18 MED ORDER — TOPIRAMATE 100 MG PO TABS: 100.0000 mg | ORAL_TABLET | Freq: Every day | ORAL | Status: DC

## 2016-07-18 NOTE — Progress Notes (Signed)
Psychiatric MD Progress Note   Patient Identification: Mary Koch MRN:  [id number] Date of Evaluation:  07/18/2016 Referral Source[facility] Chief Complaint:   Chief Complaint    Follow-up; Medication Refill     Visit Diagnosis:    ICD-9-CM ICD-10-CM   1. Episodic mood disorder ([place]) 296.90 F39   2. Panic disorder with agoraphobia and mild panic attacks 300.21 F40.01     History of Present Illness:   Patient is a 45 year old female who is currently separated from  her husband presented for follow up.  She was referred from [facility]. Patient reported that she Continues to stay at home and lives with her peers. She has lost 7 pounds since her last appointment. She has started Topamax and is helping with her weight loss. She used to take phentermine in the past. Patient reported that Cymbalta is helping her with her anxiety. She appeared calm and collected during the interview. She reported that she wakes up early in the morning. She has been staying at home with her family members and taking care of her 51year-old son. She is interested in taking the phentermine as well to help her lose weight. Patient reported that she has been trying to eat more foods and vegetables during the summer. Patient reported that the Seroquel is helping her sleep at night. She currently denied having any suicidal ideations or plans. She has noticed some improvement in her relationship with her family members. She appeared calm and collected during the interview. She denied having any suicidal homicidal ideations or plans.  .  Associated Signs/Symptoms: Depression Symptoms:  depressed mood, anhedonia, psychomotor retardation, fatigue, hopelessness, anxiety, loss of energy/fatigue, weight gain, increased appetite, (Hypo) Manic Symptoms:  Flight of Ideas, Impulsivity, Irritable Mood, Labiality of Mood, Anxiety Symptoms:  Excessive Worry, Psychotic Symptoms:   none PTSD Symptoms: Negative NA  Past Psychiatric History:  H/o Suicide attempt age 66- OD Tylenol - Not admitted to hospital Following [facility] and was diagnosed with depression and panic disorder.   Previous Psychotropic Medications:  Paxil Luvox Klonopin zoloft seroquel  Substance Abuse History in the last 12 months:  No.  Consequences of Substance Abuse: Negative NA  Past Medical History:  Past Medical History  Diagnosis Date  . Shortness of breath     when i smoke a lot  . Anxiety   . Depression   . GERD (gastroesophageal reflux disease)   . Anemia   . Neurological disorder     evaluation for ms  . Headache(784.0)   . Headache(784.0)   . Anxiety and depression   . Painful menstrual periods   . Hyperthyroidism     right portion of thyroid removed  . Heavy periods   . Allergic rhinitis   . Carpal tunnel syndrome   . Muscle spasm   . Fibroids     Past Surgical History  Procedure Laterality Date  . Thyroid lobectomy    . Cesarean section      times 2  . Cholecystectomy      Family Psychiatric History:  Father- PTSD  Due to agent Orange exposure Brother- Alcoholic  No history of suicide attempts in the family   Family History:  Family History  Problem Relation Age of Onset  . Osteoporosis Mother   . Diabetes Father   . Anxiety disorder Father   . Depression Father   . Post-traumatic stress disorder Father   . Heart disease Father   . Breast cancer Neg  Hx   . Colon cancer Neg Hx   . Anxiety disorder Sister   . Alcohol abuse Brother     Social History:   Social History   Social History  . Marital Status: Legally Separated    Spouse Name: N/A  . Number of Children: N/A  . Years of Education: N/A   Social History Main Topics  . Smoking status: Current Every Day Smoker -- 1.00 packs/day for 25 years    Types: Cigarettes    Start date: 05/22/1986  . Smokeless tobacco: Never Used  . Alcohol Use: No  . Drug Use: No  .  Sexual Activity: Yes    Birth Control/ Protection: Surgical   Other Topics Concern  . None   Social History Narrative    Additional Social History:  Married in 2002. Seperated in 2013.  Living with  2 boys 42 and 43. 43 year old has been diagnosed with autism He pays child support and she is getting help from her family.  She is currently on disability   Allergies:   Allergies  Allergen Reactions  . Azithromycin Other (See Comments)    headache  . Codeine Other (See Comments)    unknown  . Penicillins Other (See Comments)    headaches    Metabolic Disorder Labs: No results found for: HGBA1C, MPG No results found for: PROLACTIN No results found for: CHOL, TRIG, HDL, CHOLHDL, VLDL, LDLCALC   Current Medications: Current Outpatient Prescriptions  Medication Sig Dispense Refill  . cyclobenzaprine (FLEXERIL) 10 MG tablet Take 10 mg by mouth 3 (three) times daily as needed for muscle spasms.    . DULoxetine (CYMBALTA) 30 MG capsule Take 1 capsule (30 mg total) by mouth 2 (two) times daily. 60 capsule 2  . fluticasone (FLONASE) 50 MCG/ACT nasal spray Place 1 spray into both nostrils daily.  4  . Fluticasone Furoate-Vilanterol (BREO ELLIPTA) 100-25 MCG/INH AEPB INHALE 1 PUFF BY MOUTH ONCE DAILY    . furosemide (LASIX) 20 MG tablet TAKE 1 TABLET BY MOUTH TWO TIMES A WEEK  0  . gabapentin (NEURONTIN) 600 MG tablet Take 600 mg by mouth 3 (three) times daily.    . meclizine (ANTIVERT) 12.5 MG tablet Take 12.5 mg by mouth daily.    . medroxyPROGESTERone (DEPO-PROVERA) 150 MG/ML injection Inject 1 mL (150 mg total) into the muscle every 3 (three) months. 1 mL 3  . omeprazole (PRILOSEC) 40 MG capsule Take 40 mg by mouth daily.    . QUEtiapine (SEROQUEL) 50 MG tablet Take 1 tablet (50 mg total) by mouth at bedtime. 30 tablet 1  . rosuvastatin (CRESTOR) 20 MG tablet Take 20 mg by mouth daily.    Marland Kitchen topiramate (TOPAMAX) 100 MG tablet Take 1 tablet (100 mg total) by mouth daily. 30 tablet 1   . tretinoin (RETIN-A) 0.1 % cream APPLY TO AFFECTED AREA AT BEDTIME  2  . VENTOLIN HFA 108 (90 BASE) MCG/ACT inhaler INHALE 1 TO 2 PUFFS DAILY  5  . phentermine (ADIPEX-P) 37.5 MG tablet Take 37.5 mg by mouth daily.  2   No current facility-administered medications for this visit.    Neurologic: Headache: Yes Seizure: No Paresthesias:No  Musculoskeletal: Strength & Muscle Tone: within normal limits Gait & Station: normal Patient leans: N/A  Psychiatric Specialty Exam: ROS   Blood pressure 123/77, pulse 87, temperature 98.4 F (36.9 C), temperature source Tympanic, height 5\' 7"  (1.702 m), weight 240 lb (108.863 kg), SpO2 98 %.Body mass index is 37.58 kg/(m^2).  General  Appearance: Casual and Disheveled  Eye Contact:  Fair  Speech:  Clear and Coherent and Normal Rate  Volume:  Normal  Mood:  Dysphoric  Affect:  Congruent  Thought Process:  Goal Directed  Orientation:  Full (Time, Place, and Person)  Thought Content:  WDL  Suicidal Thoughts:  No  Homicidal Thoughts:  No  Memory:  Immediate;   Fair Recent;   Fair Remote;   Fair  Judgement:  Fair  Insight:  Good  Psychomotor Activity:  Normal  Concentration:  Concentration: Fair and Attention Span: Fair  Recall:  [place]: Fair  Akathisia:  No  Handed:  Right  AIMS (if indicated):    Assets:  Communication Skills Desire for Improvement Intimacy Physical Health Social Support Transportation  ADL's:  Intact  Cognition: WNL  Sleep:  6    Treatment Plan Summary: Medication management   Discussed with patient at length about the medications treatment risk benefits and alternatives Continue  Seroquel 50 mg by mouth daily at bedtime Continue Topamax 100  mg by mouth daily to help with her weight loss Continue Cymbalta 30 mg by mouth twice a day  Follow-up in 4 weeks.    More than 50% of the time spent in psychoeducation, counseling and coordination of care.    This note was  generated in part or whole with voice recognition software. Voice regonition is usually quite accurate but there are transcription errors that can and very often do occur. I apologize for any typographical errors that were not detected and corrected.    Rainey Pines, MD 7/20/201710:17 AM

## 2016-07-19 NOTE — Progress Notes (Signed)
   THERAPIST PROGRESS NOTE  Session Time: 44min  Participation Level: Active  Behavioral Response: CasualAlertDepressed  Type of Therapy: Individual Therapy  Treatment Goals addressed: Coping and Diagnosis: Depression  Interventions: CBT, Motivational Interviewing, Solution Focused, Strength-based, Supportive, Family Systems and Reframing  Summary: Mary Koch is a 45 y.o. female who presents with symptoms of her diagnosis.  LCSW discussed what psychotherapy is and is not and the importance of the therapeutic relationship to include open and honest communication between client and therapist and building trust.  Reviewed advantages and disadvantages of the therapeutic process and limitations to the therapeutic relationship including LCSW's role in maintaining the safety of the client, others and those in client's care.  Discussed her ups and downs since initial session.  Factors that contribute to client's ongoing depressive symptoms were discussed and include real and perceived feelings of isolation, criticism, rejection, shame and guilt.    Suicidal/Homicidal: Nowithout intent/plan  Therapist Response: LCSW provided Patient with ongoing emotional support and encouragement.  Normalized her feelings.  Commended Patient on her progress and reinforced the importance of client staying focused on her own strengths and resources and resiliency. Processed various strategies for dealing with stressors.    Plan: Return again in 2 weeks.  Diagnosis: Axis I: Depression, Moderate    Axis II: No diagnosis    Lubertha South, LCSW 07/03/2016

## 2016-07-22 ENCOUNTER — Other Ambulatory Visit: Payer: Self-pay | Admitting: Obstetrics and Gynecology

## 2016-07-22 DIAGNOSIS — N921 Excessive and frequent menstruation with irregular cycle: Secondary | ICD-10-CM

## 2016-07-23 ENCOUNTER — Encounter: Payer: Commercial Managed Care - HMO | Admitting: Obstetrics and Gynecology

## 2016-07-23 ENCOUNTER — Ambulatory Visit: Payer: Commercial Managed Care - HMO

## 2016-07-23 ENCOUNTER — Telehealth: Payer: Self-pay | Admitting: Obstetrics and Gynecology

## 2016-07-23 NOTE — Telephone Encounter (Signed)
Pt's last depo was 05-07-16. She has until 08/06/16 to get her depo. I sent in a refill this a.m. Please r/s her AE- first available is fine. Thanks.

## 2016-07-23 NOTE — Telephone Encounter (Signed)
So this pt did not show up this morn for her AE. She called and said the pharmacy did not have any more refills on her depo. She needed the depo last month so that's late already.... also, please advise as what to do. I can reschedule but it will be weeks before we can get her in.

## 2016-07-31 ENCOUNTER — Ambulatory Visit (INDEPENDENT_AMBULATORY_CARE_PROVIDER_SITE_OTHER): Payer: Commercial Managed Care - HMO | Admitting: Licensed Clinical Social Worker

## 2016-07-31 DIAGNOSIS — F39 Unspecified mood [affective] disorder: Secondary | ICD-10-CM

## 2016-07-31 NOTE — Progress Notes (Signed)
Please see note dated 07/31/2016

## 2016-07-31 NOTE — Progress Notes (Signed)
   THERAPIST PROGRESS NOTE  Session Time: 7min  Participation Level: Active  Behavioral Response: CasualAlertAnxious  Type of Therapy: Individual Therapy  Treatment Goals addressed: Anxiety and Coping  Interventions: CBT, Motivational Interviewing, Family Systems and Reframing  Summary: Mary Koch is a 45 y.o. female who presents with continued symptoms of her diagnosis.  Rated her symptoms a 10 out of 10.  Reports that when she has to be around other people she feels "bad".  Reports that she went to a baby shower with her family and was unable to cope.  She reports that she wanted to hit her nephew for looking and pointing at her.  States that she forgot about coping skills that were previously taught.  Reports that she has no self confidence.  Reports that her children call her a "loser."  Reports that she smokes cigarettes to avoid confrontation.  Reports smoking about 2 packs cigarettes daily to help de-stress. Discussion of coping skills (chewing bubble gum  Suicidal/Homicidal: Nowithout intent/plan  Therapist Response: LCSW provided Patient with ongoing emotional support and encouragement.  Normalized her feelings.  Commended Patient on her progress and reinforced the importance of client staying focused on her own strengths and resources and resiliency. Processed various strategies for dealing with stressors.    Plan: Return again in 4 weeks.  Diagnosis: Axis I: Episodic Mood Disorder    Axis II: No diagnosis    Lubertha South, LCSW 07/31/2016

## 2016-08-01 ENCOUNTER — Ambulatory Visit (INDEPENDENT_AMBULATORY_CARE_PROVIDER_SITE_OTHER): Payer: Commercial Managed Care - HMO

## 2016-08-01 VITALS — BP 103/77 | HR 88 | Wt 234.3 lb

## 2016-08-01 DIAGNOSIS — N921 Excessive and frequent menstruation with irregular cycle: Secondary | ICD-10-CM

## 2016-08-01 MED ORDER — MEDROXYPROGESTERONE ACETATE 150 MG/ML IM SUSP
150.0000 mg | Freq: Once | INTRAMUSCULAR | Status: AC
  Administered 2016-08-01: 150 mg via INTRAMUSCULAR

## 2016-08-01 MED ORDER — MEDROXYPROGESTERONE ACETATE 150 MG/ML IM SUSY: 150.0000 mg | PREFILLED_SYRINGE | INTRAMUSCULAR | 0 refills | Status: DC

## 2016-08-01 NOTE — Progress Notes (Signed)
Patient ID: Mary Koch, female   DOB: 1971-01-29, 45 y.o.   MRN: [id number] Pt presents for depo-provera. No c/o side effects.

## 2016-08-12 ENCOUNTER — Encounter: Payer: Self-pay | Admitting: Psychiatry

## 2016-08-12 ENCOUNTER — Ambulatory Visit (INDEPENDENT_AMBULATORY_CARE_PROVIDER_SITE_OTHER): Payer: Commercial Managed Care - HMO | Admitting: Psychiatry

## 2016-08-12 VITALS — BP 100/67 | HR 101 | Temp 98.5°F | Ht 67.0 in | Wt 232.0 lb

## 2016-08-12 DIAGNOSIS — F4001 Agoraphobia with panic disorder: Secondary | ICD-10-CM

## 2016-08-12 DIAGNOSIS — F39 Unspecified mood [affective] disorder: Secondary | ICD-10-CM

## 2016-08-12 MED ORDER — TOPIRAMATE 100 MG PO TABS: 100.0000 mg | ORAL_TABLET | Freq: Every day | ORAL | 1 refills | Status: DC

## 2016-08-12 MED ORDER — DULOXETINE HCL 30 MG PO CPEP: 30.0000 mg | ORAL_CAPSULE | Freq: Two times a day (BID) | ORAL | 2 refills | Status: DC

## 2016-08-12 MED ORDER — QUETIAPINE FUMARATE 50 MG PO TABS: 50.0000 mg | ORAL_TABLET | Freq: Every day | ORAL | 1 refills | Status: DC

## 2016-08-12 NOTE — Progress Notes (Signed)
Psychiatric MD Progress Note   Patient Identification: Mary Koch MRN:  [id number] Date of Evaluation:  08/12/2016 [facility] Chief Complaint:   Chief Complaint    Follow-up; Medication Refill     Visit Diagnosis:    ICD-9-CM ICD-10-CM   1. Episodic mood disorder ([place]) 296.90 F39   2. Panic disorder with agoraphobia and mild panic attacks 300.21 F40.01     History of Present Illness:   Patient is a 45 year old female who is currently separated from  her husband presented for follow up accompanied by her 36-year-old son. She reported that she has started losing weight and is excited about the same. Patient reported that her medications are helping her. She has lost significant amount of weight due to the combination of her medications. She reported that she is eating less. She appeared calm and collected during the interview. 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 She is excited about the same. Her son was also interactive during the interview and appeared well behaved. Patient reported that she sleeps well with the help of Seroquel at night. She does not have any side effects of the medications.  . She denied having any suicidal homicidal ideations or plans.  .  Associated Signs/Symptoms: Depression Symptoms:  fatigue, hopelessness, anxiety, loss of energy/fatigue, weight gain, increased appetite, (Hypo) Manic Symptoms:  Flight of Ideas, Impulsivity, Irritable Mood, Labiality of Mood, Anxiety Symptoms:  Excessive Worry, Psychotic Symptoms:  none PTSD Symptoms: Negative NA  Past Psychiatric History:  H/o Suicide attempt age 67- OD Tylenol - Not admitted to hospital Following [facility] and was diagnosed with depression and panic disorder.   Previous Psychotropic Medications:  Paxil Luvox Klonopin zoloft seroquel  Substance Abuse History in the last 12  months:  No.  Consequences of Substance Abuse: Negative NA  Past Medical History:  Past Medical History:  Diagnosis Date  . Allergic rhinitis   . Anemia   . Anxiety   . Anxiety and depression   . Carpal tunnel syndrome   . Depression   . Fibroids   . GERD (gastroesophageal reflux disease)   . Headache(784.0)   . Headache(784.0)   . Heavy periods   . Hyperthyroidism    right portion of thyroid removed  . Muscle spasm   . Neurological disorder    evaluation for ms  . Painful menstrual periods   . Shortness of breath    when i smoke a lot    Past Surgical History:  Procedure Laterality Date  . CESAREAN SECTION     times 2  . CHOLECYSTECTOMY    . THYROID LOBECTOMY      Family Psychiatric History:  Father- PTSD  Due to agent Orange exposure Brother- Alcoholic  No history of suicide attempts in the family   Family History:  Family History  Problem Relation Age of Onset  . Osteoporosis Mother   . Diabetes Father   . Anxiety disorder Father   . Depression Father   . Post-traumatic stress disorder Father   . Heart disease Father   . Anxiety disorder Sister   . Alcohol abuse Brother   . Breast cancer Neg Hx   . Colon cancer Neg Hx     Social History:   Social History   Social History  . Marital status: Legally Separated    Spouse name: N/A  . Number of children: N/A  . Years of education: N/A  Social History Main Topics  . Smoking status: Current Every Day Smoker    Packs/day: 1.00    Years: 25.00    Types: Cigarettes    Start date: 05/22/1986  . Smokeless tobacco: Never Used  . Alcohol use No  . Drug use: No  . Sexual activity: Yes    Birth control/ protection: Surgical   Other Topics Concern  . None   Social History Narrative  . None    Additional Social History:  Married in 2002. Seperated in 2013.  Living with  2 boys 104 and 34. 43 year old has been diagnosed with autism He pays child support and she is getting help from her family.   She is currently on disability   Allergies:   Allergies  Allergen Reactions  . Azithromycin Other (See Comments)    headache  . Codeine Other (See Comments)    unknown  . Penicillins Other (See Comments)    headaches    Metabolic Disorder Labs: No results found for: HGBA1C, MPG No results found for: PROLACTIN No results found for: CHOL, TRIG, HDL, CHOLHDL, VLDL, LDLCALC   Current Medications: Current Outpatient Prescriptions  Medication Sig Dispense Refill  . cyclobenzaprine (FLEXERIL) 10 MG tablet Take 10 mg by mouth 3 (three) times daily as needed for muscle spasms.    . DULoxetine (CYMBALTA) 30 MG capsule Take 1 capsule (30 mg total) by mouth 2 (two) times daily. 60 capsule 2  . fluticasone (FLONASE) 50 MCG/ACT nasal spray Place 1 spray into both nostrils daily.  4  . Fluticasone Furoate-Vilanterol (BREO ELLIPTA) 100-25 MCG/INH AEPB INHALE 1 PUFF BY MOUTH ONCE DAILY    . furosemide (LASIX) 20 MG tablet TAKE 1 TABLET BY MOUTH TWO TIMES A WEEK  0  . gabapentin (NEURONTIN) 600 MG tablet Take 1 tablet (600 mg total) by mouth 3 (three) times daily. 90 tablet 1  . meclizine (ANTIVERT) 12.5 MG tablet Take 12.5 mg by mouth daily.    . MedroxyPROGESTERone Acetate 150 MG/ML SUSY Inject 1 mL (150 mg total) into the muscle every 3 (three) months. 1 Syringe 0  . omeprazole (PRILOSEC) 40 MG capsule Take 40 mg by mouth daily.    . phentermine (ADIPEX-P) 37.5 MG tablet Take 37.5 mg by mouth daily.  2  . QUEtiapine (SEROQUEL) 50 MG tablet Take 1 tablet (50 mg total) by mouth at bedtime. 30 tablet 1  . rosuvastatin (CRESTOR) 20 MG tablet Take 20 mg by mouth daily.    Marland Kitchen topiramate (TOPAMAX) 100 MG tablet Take 1 tablet (100 mg total) by mouth daily. 30 tablet 1  . tretinoin (RETIN-A) 0.1 % cream APPLY TO AFFECTED AREA AT BEDTIME  2  . VENTOLIN HFA 108 (90 BASE) MCG/ACT inhaler INHALE 1 TO 2 PUFFS DAILY  5   No current facility-administered medications for this visit.      Neurologic: Headache: Yes Seizure: No Paresthesias:No  Musculoskeletal: Strength & Muscle Tone: within normal limits Gait & Station: normal Patient leans: N/A  Psychiatric Specialty Exam: ROS  Blood pressure 100/67, pulse (!) 101, temperature 98.5 F (36.9 C), temperature source Oral, height 5\' 7"  (1.702 m), weight 232 lb (105.2 kg).Body mass index is 36.34 kg/m.  General Appearance: Casual and Disheveled  Eye Contact:  Fair  Speech:  Clear and Coherent and Normal Rate  Volume:  Normal  Mood:  Dysphoric  Affect:  Congruent  Thought Process:  Goal Directed  Orientation:  Full (Time, Place, and Person)  Thought Content:  WDL  Suicidal  Thoughts:  No  Homicidal Thoughts:  No  Memory:  Immediate;   Fair Recent;   Fair Remote;   Fair  Judgement:  Fair  Insight:  Good  Psychomotor Activity:  Normal  Concentration:  Concentration: Fair and Attention Span: Fair  Recall:  [employer] [place]: Fair  Akathisia:  No  Handed:  Right  AIMS (if indicated):    Assets:  Communication Skills Desire for Improvement Intimacy Physical Health Social Support Transportation  ADL's:  Intact  Cognition: WNL  Sleep:  6    Treatment Plan Summary: Medication management   Discussed with patient at length about the medications treatment risk benefits and alternatives Continue  Seroquel 50 mg by mouth daily at bedtime Continue Topamax 100  mg by mouth daily to help with her weight loss Continue Cymbalta 30 mg by mouth twice a day  Follow-up in 2 months   More than 50% of the time spent in psychoeducation, counseling and coordination of care.    This note was generated in part or whole with voice recognition software. Voice regonition is usually quite accurate but there are transcription errors that can and very often do occur. I apologize for any typographical errors that were not detected and corrected.    Rainey Pines, MD 8/14/20171:35 PM

## 2016-08-13 DIAGNOSIS — L700 Acne vulgaris: Secondary | ICD-10-CM | POA: Diagnosis not present

## 2016-08-21 DIAGNOSIS — G5603 Carpal tunnel syndrome, bilateral upper limbs: Secondary | ICD-10-CM | POA: Diagnosis not present

## 2016-08-21 DIAGNOSIS — G43119 Migraine with aura, intractable, without status migrainosus: Secondary | ICD-10-CM | POA: Diagnosis not present

## 2016-08-28 ENCOUNTER — Ambulatory Visit (INDEPENDENT_AMBULATORY_CARE_PROVIDER_SITE_OTHER): Payer: Commercial Managed Care - HMO | Admitting: Licensed Clinical Social Worker

## 2016-08-28 DIAGNOSIS — F39 Unspecified mood [affective] disorder: Secondary | ICD-10-CM | POA: Diagnosis not present

## 2016-09-10 DIAGNOSIS — Z8719 Personal history of other diseases of the digestive system: Secondary | ICD-10-CM | POA: Diagnosis not present

## 2016-09-10 DIAGNOSIS — K2900 Acute gastritis without bleeding: Secondary | ICD-10-CM | POA: Diagnosis not present

## 2016-09-10 DIAGNOSIS — F329 Major depressive disorder, single episode, unspecified: Secondary | ICD-10-CM | POA: Diagnosis not present

## 2016-09-10 DIAGNOSIS — K219 Gastro-esophageal reflux disease without esophagitis: Secondary | ICD-10-CM | POA: Diagnosis not present

## 2016-09-10 NOTE — Progress Notes (Signed)
   THERAPIST PROGRESS NOTE  Session Time: 32min  Participation Level: Active  Behavioral Response: CasualAlertEuthymic  Type of Therapy: Individual Therapy  Treatment Goals addressed: Coping  Interventions: Motivational Interviewing and Solution Focused  Summary: Mary Koch is a 45 y.o. female who presents with continued symptoms of her diagnosis.  Patient reported her mood as "good."  Patient vacillated between baseline and intense anxiety throughout the session.  Patient reported that she enjoys her therapy sessions.  Patient reported that she is doing well with her mood, eating and sleeping habits.  Patient reported that she found housing but is waiting on the landlord to complete the repairs.  She reports being worried that she may not be able to pay all her monthly bills if she rents out the home.   Patient began to feel more comfortable with engagement in relaxation strategies..   Suicidal/Homicidal: No  Therapist Response: Provided support for Patient as she discussed her current mood. Stressed the importance of mood stabilization through learned coping skills and medication management.  Encouraged Patient to focus on her strengths and the positive aspects.  Plan: Return again in 2 weeks.  Diagnosis: Axis I: Mood Disorder    Axis II: No diagnosis    Lubertha South, LCSW 09/10/2016

## 2016-09-12 DIAGNOSIS — G5603 Carpal tunnel syndrome, bilateral upper limbs: Secondary | ICD-10-CM | POA: Diagnosis not present

## 2016-09-26 ENCOUNTER — Ambulatory Visit (INDEPENDENT_AMBULATORY_CARE_PROVIDER_SITE_OTHER): Payer: Commercial Managed Care - HMO | Admitting: Licensed Clinical Social Worker

## 2016-09-26 DIAGNOSIS — F39 Unspecified mood [affective] disorder: Secondary | ICD-10-CM | POA: Diagnosis not present

## 2016-10-04 NOTE — Progress Notes (Signed)
   THERAPIST PROGRESS NOTE  Session Time: 72  Participation Level: Active  Behavioral Response: CasualAlertDepressed  Type of Therapy: Individual Therapy  Treatment Goals addressed: Communication: strategies/husband  Interventions: CBT, Motivational Interviewing and Solution Focused  Summary: Mary Koch is a 45 y.o. female who presents with continued symptoms of her diagnosis. Patient was open to be re-educated, explained and assisted with understanding her symptoms of her mental illness to assist her with recognizing and understanding her problematic behaviors.  Therapist requested Patient verbalize her struggles with inappropriate reactions toward her husband.  She states that she has difficulty with communicating with him about their estranged relationship and children whom are defiant towards her. Role played how to discuss her concerns with him. Therapist reinforced Patient's behavior by praising her for doing a good job working on Education administrator and applying them to her day to day interactions.   Suicidal/Homicidal: No  Therapist Response: LCSW provided Patient with ongoing emotional support and encouragement.  Normalized her feelings.  Commended Patient on her progress and reinforced the importance of client staying focused on her own strengths and resources and resiliency. Processed various strategies for dealing with stressors.   Plan: Return again in 2 weeks.  Diagnosis: Axis I: Bipolar, Depressed    Axis II: No diagnosis    Lubertha South, LCSW 09/26/2016

## 2016-10-10 ENCOUNTER — Encounter: Payer: Self-pay | Admitting: Psychiatry

## 2016-10-10 ENCOUNTER — Ambulatory Visit (INDEPENDENT_AMBULATORY_CARE_PROVIDER_SITE_OTHER): Payer: Commercial Managed Care - HMO | Admitting: Psychiatry

## 2016-10-10 VITALS — BP 121/86 | HR 93 | Temp 98.3°F | Wt 230.0 lb

## 2016-10-10 DIAGNOSIS — F39 Unspecified mood [affective] disorder: Secondary | ICD-10-CM

## 2016-10-10 DIAGNOSIS — F4001 Agoraphobia with panic disorder: Secondary | ICD-10-CM

## 2016-10-10 MED ORDER — QUETIAPINE FUMARATE 50 MG PO TABS: 50.0000 mg | ORAL_TABLET | Freq: Every day | ORAL | 2 refills | Status: DC

## 2016-10-10 MED ORDER — GABAPENTIN 300 MG PO CAPS: ORAL_CAPSULE | ORAL | 2 refills | Status: DC

## 2016-10-10 MED ORDER — TOPIRAMATE 100 MG PO TABS: 150.0000 mg | ORAL_TABLET | Freq: Every day | ORAL | 2 refills | Status: DC

## 2016-10-10 MED ORDER — DULOXETINE HCL 30 MG PO CPEP: 30.0000 mg | ORAL_CAPSULE | Freq: Two times a day (BID) | ORAL | 2 refills | Status: DC

## 2016-10-10 NOTE — Progress Notes (Signed)
Psychiatric MD Progress Note   Patient Identification: Mary Koch MRN:  [id number] Date of Evaluation:  10/10/2016 Referral Source: [facility] Chief Complaint:   Chief Complaint    Follow-up; Medication Refill     Visit Diagnosis:    ICD-9-CM ICD-10-CM   1. Episodic mood disorder ([place]) 296.90 F39   2. Panic disorder with agoraphobia and mild panic attacks 300.21 F40.01     History of Present Illness:   Patient is a 45 year old female who is currently separated from  her husband presented for follow up. She reported that she has been doing well and has been taking her medications. She reported that she has started having headaches for the past 2 days. Patient reported that she has been compliant with her medications. She reported that the medications are helping her. She is interested in going higher on the dose of the Topamax as she feels that her headaches are becoming worse. She is also drinking [facility] on a regular basis and has been taking energy  water. She reported that she cannot sleep well at night. She is also getting phentermine from her primary care physician. She appeared tired during the interview. She currently denied having any suicidal ideations or plans.  Discussed about the use of [facility] as well as the energy water as it is causing her to use extra caffeine which is causing her insomnia and she demonstrated understanding. She reported that her primary care is prescribing her phentermine causing her to lose weight. She has lost 2 pounds in the past couple of months.   Patient is interested in going higher on the dose of Topamax at this time to help as her headaches.   She denied having any perceptual disturbances. She is also following with her therapist Elmyra Ricks on a monthly basis.    Associated Signs/Symptoms: Depression Symptoms:  fatigue, hopelessness, anxiety, loss of energy/fatigue, weight gain, increased appetite, (Hypo) Manic  Symptoms:  Flight of Ideas, Impulsivity, Irritable Mood, Labiality of Mood, Anxiety Symptoms:  Excessive Worry, Psychotic Symptoms:  none PTSD Symptoms: Negative NA  Past Psychiatric History:  H/o Suicide attempt age 69- OD Tylenol - Not admitted to hospital Following [facility] and was diagnosed with depression and panic disorder.   Previous Psychotropic Medications:  Paxil Luvox Klonopin zoloft seroquel  Substance Abuse History in the last 12 months:  No.  Consequences of Substance Abuse: Negative NA  Past Medical History:  Past Medical History:  Diagnosis Date  . Allergic rhinitis   . Anemia   . Anxiety   . Anxiety and depression   . Carpal tunnel syndrome   . Depression   . Fibroids   . GERD (gastroesophageal reflux disease)   . Headache(784.0)   . Headache(784.0)   . Heavy periods   . Hyperthyroidism    right portion of thyroid removed  . Muscle spasm   . Neurological disorder    evaluation for ms  . Painful menstrual periods   . Shortness of breath    when i smoke a lot    Past Surgical History:  Procedure Laterality Date  . CESAREAN SECTION     times 2  . CHOLECYSTECTOMY    . THYROID LOBECTOMY      Family Psychiatric History:  Father- PTSD  Due to agent Orange exposure Brother- Alcoholic  No history of suicide attempts in the family   Family History:  Family History  Problem Relation Age of Onset  . Osteoporosis Mother   .  Diabetes Father   . Anxiety disorder Father   . Depression Father   . Post-traumatic stress disorder Father   . Heart disease Father   . Anxiety disorder Sister   . Alcohol abuse Brother   . Breast cancer Neg Hx   . Colon cancer Neg Hx     Social History:   Social History   Social History  . Marital status: Legally Separated    Spouse name: N/A  . Number of children: N/A  . Years of education: N/A   Social History Main Topics  . Smoking status: Current Every Day Smoker    Packs/day:  1.00    Years: 25.00    Types: Cigarettes    Start date: 05/22/1986  . Smokeless tobacco: Never Used  . Alcohol use No  . Drug use: No  . Sexual activity: Yes    Birth control/ protection: Surgical   Other Topics Concern  . None   Social History Narrative  . None    Additional Social History:  Married in 2002. Seperated in 2013.  Living with  2 boys 59 and 60. 45 year old has been diagnosed with autism He pays child support and she is getting help from her family.  She is currently on disability   Allergies:   Allergies  Allergen Reactions  . Azithromycin Other (See Comments)    headache  . Codeine Other (See Comments)    unknown  . Penicillins Other (See Comments)    headaches    Metabolic Disorder Labs: No results found for: HGBA1C, MPG No results found for: PROLACTIN No results found for: CHOL, TRIG, HDL, CHOLHDL, VLDL, LDLCALC   Current Medications: Current Outpatient Prescriptions  Medication Sig Dispense Refill  . albuterol (PROVENTIL HFA;VENTOLIN HFA) 108 (90 Base) MCG/ACT inhaler INHALE 1 TO 2 PUFFS DAILY    . cyclobenzaprine (FLEXERIL) 10 MG tablet Take 10 mg by mouth 3 (three) times daily as needed for muscle spasms.    . DULoxetine (CYMBALTA) 30 MG capsule Take by mouth.    . DULoxetine (CYMBALTA) 30 MG capsule Take 1 capsule (30 mg total) by mouth 2 (two) times daily. 60 capsule 2  . fluticasone (FLONASE) 50 MCG/ACT nasal spray Place 1 spray into both nostrils daily.  4  . Fluticasone Furoate-Vilanterol (BREO ELLIPTA) 100-25 MCG/INH AEPB INHALE 1 PUFF BY MOUTH ONCE DAILY    . furosemide (LASIX) 20 MG tablet TAKE 1 TABLET BY MOUTH TWO TIMES A WEEK  0  . gabapentin (NEURONTIN) 600 MG tablet Take 1 tablet (600 mg total) by mouth 3 (three) times daily. 90 tablet 1  . HYDROcodone-acetaminophen (NORCO/VICODIN) 5-325 MG tablet Take by mouth.    . meclizine (ANTIVERT) 12.5 MG tablet Take 12.5 mg by mouth daily.    . MedroxyPROGESTERone Acetate 150 MG/ML SUSY  Inject 1 mL (150 mg total) into the muscle every 3 (three) months. 1 Syringe 0  . omeprazole (PRILOSEC) 40 MG capsule Take 40 mg by mouth daily.    . phentermine (ADIPEX-P) 37.5 MG tablet Take 37.5 mg by mouth daily.  2  . QUEtiapine (SEROQUEL) 50 MG tablet Take 1 tablet (50 mg total) by mouth at bedtime. 30 tablet 1  . rosuvastatin (CRESTOR) 20 MG tablet Take 20 mg by mouth daily.    Marland Kitchen topiramate (TOPAMAX) 100 MG tablet Take 1 tablet (100 mg total) by mouth daily. 30 tablet 1  . tretinoin (RETIN-A) 0.1 % cream APPLY TO AFFECTED AREA AT BEDTIME  2  . VENTOLIN HFA 108 (  90 BASE) MCG/ACT inhaler INHALE 1 TO 2 PUFFS DAILY  5   No current facility-administered medications for this visit.     Neurologic: Headache: Yes Seizure: No Paresthesias:No  Musculoskeletal: Strength & Muscle Tone: within normal limits Gait & Station: normal Patient leans: N/A  Psychiatric Specialty Exam: ROS  Blood pressure 121/86, pulse 93, temperature 98.3 F (36.8 C), temperature source Oral, weight 230 lb (104.3 kg).Body mass index is 36.02 kg/m.  General Appearance: Casual and Disheveled  Eye Contact:  Fair  Speech:  Clear and Coherent and Normal Rate  Volume:  Normal  Mood:  Dysphoric  Affect:  Congruent  Thought Process:  Goal Directed  Orientation:  Full (Time, Place, and Person)  Thought Content:  WDL  Suicidal Thoughts:  No  Homicidal Thoughts:  No  Memory:  Immediate;   Fair Recent;   Fair Remote;   Fair  Judgement:  Fair  Insight:  Good  Psychomotor Activity:  Normal  Concentration:  Concentration: Fair and Attention Span: Fair  Recall:  [employer] of Knowledge:Fair  Language: Fair  Akathisia:  No  Handed:  Right  AIMS (if indicated):    Assets:  Communication Skills Desire for Improvement Intimacy Physical Health Social Support Transportation  ADL's:  Intact  Cognition: WNL  Sleep:  6    Treatment Plan Summary: Medication management   Discussed with patient at length about  the medications treatment risk benefits and alternatives Continue  Seroquel 50 mg by mouth daily at bedtime I will titrate Topamax 150 mg by mouth daily and she agreed with the plan. Continue Cymbalta 30 mg by mouth twice a day Also takes Neurontin 600 mg in the morning and 300 at bedtime. Continue with her therapy appointments.  Follow-up in 1 months   More than 50% of the time spent in psychoeducation, counseling and coordination of care.    This note was generated in part or whole with voice recognition software. Voice regonition is usually quite accurate but there are transcription errors that can and very often do occur. I apologize for any typographical errors that were not detected and corrected.    Rainey Pines, MD 10/12/201710:11 AM

## 2016-10-14 NOTE — Progress Notes (Signed)
ANNUAL PREVENTATIVE CARE GYN  ENCOUNTER NOTE  Subjective:       Mary Koch is a 45 y.o. No obstetric history on file. female here for a routine annual gynecologic exam.  Current complaints: 1.   None  Still smoking 1 pack of cigarettes a day. Efforts to quit have been unsuccessful. Weight is up 3 pounds this year. No abnormal uterine bleeding on Depo-Provera   Gynecologic History No LMP recorded. Patient has had an injection. Contraception: Depo-Provera injections/TL Last Pap: 04/2014 neg/neg. Results were: normal Last mammogram: 07/2014 birad 1. Results were: normal  Obstetric History OB History  No data available    Past Medical History:  Diagnosis Date  . Allergic rhinitis   . Anemia   . Anxiety   . Anxiety and depression   . Carpal tunnel syndrome   . Depression   . Fibroids   . GERD (gastroesophageal reflux disease)   . Headache(784.0)   . Headache(784.0)   . Heavy periods   . Hyperthyroidism    right portion of thyroid removed  . Muscle spasm   . Neurological disorder    evaluation for ms  . Painful menstrual periods   . Shortness of breath    when i smoke a lot    Past Surgical History:  Procedure Laterality Date  . CESAREAN SECTION     times 2  . CHOLECYSTECTOMY    . THYROID LOBECTOMY      Current Outpatient Prescriptions on File Prior to Visit  Medication Sig Dispense Refill  . albuterol (PROVENTIL HFA;VENTOLIN HFA) 108 (90 Base) MCG/ACT inhaler INHALE 1 TO 2 PUFFS DAILY    . cyclobenzaprine (FLEXERIL) 10 MG tablet Take 10 mg by mouth 3 (three) times daily as needed for muscle spasms.    . DULoxetine (CYMBALTA) 30 MG capsule Take 1 capsule (30 mg total) by mouth 2 (two) times daily. 60 capsule 2  . DULoxetine (CYMBALTA) 30 MG capsule Take 1 capsule (30 mg total) by mouth 2 (two) times daily. 60 capsule 2  . fluticasone (FLONASE) 50 MCG/ACT nasal spray Place 1 spray into both nostrils daily.  4  . Fluticasone Furoate-Vilanterol (BREO ELLIPTA)  100-25 MCG/INH AEPB INHALE 1 PUFF BY MOUTH ONCE DAILY    . gabapentin (NEURONTIN) 300 MG capsule 2 pills in am and 1 pill at night 90 capsule 2  . MedroxyPROGESTERone Acetate 150 MG/ML SUSY Inject 1 mL (150 mg total) into the muscle every 3 (three) months. 1 Syringe 0  . omeprazole (PRILOSEC) 40 MG capsule Take 40 mg by mouth daily.    . phentermine (ADIPEX-P) 37.5 MG tablet Take 37.5 mg by mouth daily.  2  . QUEtiapine (SEROQUEL) 50 MG tablet Take 1 tablet (50 mg total) by mouth at bedtime. 30 tablet 2  . QUEtiapine (SEROQUEL) 50 MG tablet Take 1 tablet (50 mg total) by mouth at bedtime. 30 tablet 2  . rosuvastatin (CRESTOR) 20 MG tablet Take 20 mg by mouth daily.    Marland Kitchen topiramate (TOPAMAX) 100 MG tablet Take 1.5 tablets (150 mg total) by mouth daily. 45 tablet 2  . tretinoin (RETIN-A) 0.1 % cream APPLY TO AFFECTED AREA AT BEDTIME  2  . VENTOLIN HFA 108 (90 BASE) MCG/ACT inhaler INHALE 1 TO 2 PUFFS DAILY  5   No current facility-administered medications on file prior to visit.     Allergies  Allergen Reactions  . Azithromycin Other (See Comments)    headache  . Codeine Other (See Comments)  unknown  . Penicillins Other (See Comments)    headaches    Social History   Social History  . Marital status: Legally Separated    Spouse name: N/A  . Number of children: N/A  . Years of education: N/A   Occupational History  . Not on file.   Social History Main Topics  . Smoking status: Current Every Day Smoker    Packs/day: 1.00    Years: 25.00    Types: Cigarettes    Start date: 05/22/1986  . Smokeless tobacco: Never Used  . Alcohol use No  . Drug use: No  . Sexual activity: Yes    Birth control/ protection: Surgical   Other Topics Concern  . Not on file   Social History Narrative  . No narrative on file    Family History  Problem Relation Age of Onset  . Osteoporosis Mother   . Diabetes Father   . Anxiety disorder Father   . Depression Father   . Post-traumatic  stress disorder Father   . Heart disease Father   . Anxiety disorder Sister   . Alcohol abuse Brother   . Breast cancer Neg Hx   . Colon cancer Neg Hx     The following portions of the patient's history were reviewed and updated as appropriate: allergies, current medications, past family history, past medical history, past social history, past surgical history and problem list.  Review of Systems ROS Review of Systems - General ROS: negative for - chills, fatigue, fever, hot flashes, night sweats, weight gain or weight loss Psychological ROS: negative for - anxiety, decreased libido, depression, mood swings, physical abuse or sexual abuse Ophthalmic ROS: negative for - blurry vision, eye pain or loss of vision ENT ROS: negative for - headaches, hearing change, visual changes or vocal changes Allergy and Immunology ROS: negative for - hives, itchy/watery eyes or seasonal allergies Hematological and Lymphatic ROS: negative for - bleeding problems, bruising, swollen lymph nodes or weight loss Endocrine ROS: negative for - galactorrhea, hair pattern changes, hot flashes, malaise/lethargy, mood swings, palpitations, polydipsia/polyuria, skin changes, temperature intolerance or unexpected weight changes Breast ROS: negative for - new or changing breast lumps or nipple discharge Respiratory ROS: negative for - cough or shortness of breath Cardiovascular ROS: negative for - chest pain, irregular heartbeat, palpitations or shortness of breath Gastrointestinal ROS: no abdominal pain, change in bowel habits, or black or bloody stools Genito-Urinary ROS: no dysuria, trouble voiding, or hematuria Musculoskeletal ROS: negative for - joint pain or joint stiffness Neurological ROS: negative for - bowel and bladder control changes Dermatological ROS: negative for rash and skin lesion changes   Objective:   BP 103/63   Pulse (!) 102   Ht 5\' 7"  (1.702 m)   Wt 226 lb 4.8 oz (102.6 kg)   BMI 35.44 kg/m   CONSTITUTIONAL: Well-developed, well-nourished female in no acute distress.  PSYCHIATRIC: Normal mood and affect. Normal behavior. Normal judgment and thought content. [place]: Alert and oriented to person, place, and time. Normal muscle tone coordination. No cranial nerve deficit noted. HENT:  Normocephalic, atraumatic, External right and left ear normal. Oropharynx is clear and moist EYES: Conjunctivae and EOM are normal. Pupils are equal, round, and reactive to light. No scleral icterus.  NECK: Normal range of motion, supple, no masses.  Normal thyroid.  SKIN: Skin is warm and dry. No rash noted. Not diaphoretic. No erythema. No pallor. CARDIOVASCULAR: Normal heart rate noted, regular rhythm, no murmur. RESPIRATORY: Clear to auscultation bilaterally.  Effort and breath sounds normal, no problems with respiration noted. BREASTS: Symmetric in size. No masses, skin changes, nipple drainage, or lymphadenopathy. ABDOMEN: Soft, normal bowel sounds, no distention noted.  No tenderness, rebound or guarding. C-section incisions well-healed; no evidence of rash or inflammation today BLADDER: Normal PELVIC:  External Genitalia: Normal  BUS: Normal  Vagina: Normal; good vault support; proximal narrowing near cervix  Cervix: Normal; no cervical motion tenderness  Uterus: Normal; midplane, situated high within the vault; mobile; nontender  [place]: Normal; nonpalpable and nontender  RV: External Exam NormaI  MUSCULOSKELETAL: Normal range of motion. No tenderness.  No cyanosis, clubbing, or edema.  2+ distal pulses. LYMPHATIC: No Axillary, Supraclavicular, or Inguinal Adenopathy.    Assessment:   Annual gynecologic examination 45 y.o. Contraception: Depo-Provera injections/TL [id number] Problem List Items Addressed This Visit    Increased BMI    Other Visit Diagnoses    Well woman exam with routine gynecological exam    -  Primary   Tobacco user       Menorrhagia with irregular cycle        Encounter for screening mammogram for breast cancer          Plan:  Pap: due 2018 Mammogram: Ordered Stool Guaiac Testing:  Not Indicated Labs: thru pcp Routine preventative health maintenance measures emphasized: Exercise/Diet/Weight control, Tobacco Warnings and Alcohol/Substance use risks Continue Depo-Provera every 3 months Contraception-tubal ligation Nystatin/triamcinolone cream prescribed (per patient request) for C-section irritation/rash previously treated Return to [place], CMA  Brayton Mars, MD  Note: This dictation was prepared with Dragon dictation along with smaller phrase technology. Any transcriptional errors that result from this process are unintentional.

## 2016-10-17 ENCOUNTER — Encounter: Payer: Self-pay | Admitting: Obstetrics and Gynecology

## 2016-10-17 ENCOUNTER — Ambulatory Visit (INDEPENDENT_AMBULATORY_CARE_PROVIDER_SITE_OTHER): Payer: Commercial Managed Care - HMO | Admitting: Obstetrics and Gynecology

## 2016-10-17 VITALS — BP 103/63 | HR 102 | Ht 67.0 in | Wt 226.3 lb

## 2016-10-17 DIAGNOSIS — R638 Other symptoms and signs concerning food and fluid intake: Secondary | ICD-10-CM | POA: Diagnosis not present

## 2016-10-17 DIAGNOSIS — N921 Excessive and frequent menstruation with irregular cycle: Secondary | ICD-10-CM | POA: Diagnosis not present

## 2016-10-17 DIAGNOSIS — Z01419 Encounter for gynecological examination (general) (routine) without abnormal findings: Secondary | ICD-10-CM

## 2016-10-17 DIAGNOSIS — Z720 Tobacco use: Secondary | ICD-10-CM

## 2016-10-17 DIAGNOSIS — Z1231 Encounter for screening mammogram for malignant neoplasm of breast: Secondary | ICD-10-CM | POA: Diagnosis not present

## 2016-10-17 DIAGNOSIS — Z3042 Encounter for surveillance of injectable contraceptive: Secondary | ICD-10-CM | POA: Diagnosis not present

## 2016-10-17 MED ORDER — MEDROXYPROGESTERONE ACETATE 150 MG/ML IM SUSY: 150.0000 mg | PREFILLED_SYRINGE | INTRAMUSCULAR | 0 refills | Status: DC

## 2016-10-17 MED ORDER — NYSTATIN-TRIAMCINOLONE 100000-0.1 UNIT/GM-% EX CREA: 1.0000 "application " | TOPICAL_CREAM | Freq: Two times a day (BID) | CUTANEOUS | 1 refills | Status: DC

## 2016-10-17 NOTE — Patient Instructions (Addendum)
1. No Pap smear 2. Self breast awareness; mammogram ordered 3. Smoking cessation discussed and encouraged 4. Continue with healthy eating and exercise with controlled weight loss-goal of 10 pounds per year 5. Labs per PCP 6. return in 1 year 7. Continue with Depo-Provera every 3 months for cycle suppression

## 2016-10-21 ENCOUNTER — Ambulatory Visit (INDEPENDENT_AMBULATORY_CARE_PROVIDER_SITE_OTHER): Payer: Commercial Managed Care - HMO

## 2016-10-21 VITALS — BP 104/76 | HR 85 | Wt 226.9 lb

## 2016-10-21 DIAGNOSIS — Z3042 Encounter for surveillance of injectable contraceptive: Secondary | ICD-10-CM | POA: Diagnosis not present

## 2016-10-21 MED ORDER — MEDROXYPROGESTERONE ACETATE 150 MG/ML IM SUSP
150.0000 mg | Freq: Once | INTRAMUSCULAR | Status: AC
  Administered 2016-10-21: 150 mg via INTRAMUSCULAR

## 2016-10-21 NOTE — Progress Notes (Signed)
Patient ID: Mary Koch, female   DOB: December 08, 1971, 45 y.o.   MRN: [id number]  Pt presents for depo-provera injection for contraception. No c/o side effects.

## 2016-10-24 ENCOUNTER — Ambulatory Visit (INDEPENDENT_AMBULATORY_CARE_PROVIDER_SITE_OTHER): Payer: Commercial Managed Care - HMO | Admitting: Licensed Clinical Social Worker

## 2016-10-24 DIAGNOSIS — F39 Unspecified mood [affective] disorder: Secondary | ICD-10-CM

## 2016-10-29 NOTE — Progress Notes (Signed)
   THERAPIST PROGRESS NOTE  Session Time: 62min  Participation Level: Active  Behavioral Response: CasualAlertDepressed  Type of Therapy: Individual Therapy  Treatment Goals addressed: Coping  Interventions: CBT and Reframing  Summary: Mary Koch is a 45 y.o. female who presents with continued symptoms of her diagnosis.  Patient reports that she has been sad lately.  She denies any triggers.  She states that past coping skills learned have not been useful.  She reports that she is having difficulty moving in the rental home due to renovations.  Patient reports that she is unsure if she can financially move in the home.  Patient was able to list her income and list her bills to determine how to budget.   Patient was able to verbally discuss how her finances play a part in her mood.   Suicidal/Homicidal: No  Therapist Response: Assessed ptt current functioning per pt report.  Processed w/pt how unresolved thoughts and feelings have an  impact having on mood.  Explored w/pt other barriers to wellness and encouraged pt to make changes for self  Plan: Return again in2 weeks.  Diagnosis: Axis I: Mood Disorder    Axis II: No diagnosis    Lubertha South, LCSW 10/24/2016

## 2016-11-04 DIAGNOSIS — M4605 Spinal enthesopathy, thoracolumbar region: Secondary | ICD-10-CM | POA: Diagnosis not present

## 2016-11-04 DIAGNOSIS — M62830 Muscle spasm of back: Secondary | ICD-10-CM | POA: Diagnosis not present

## 2016-11-04 DIAGNOSIS — M9902 Segmental and somatic dysfunction of thoracic region: Secondary | ICD-10-CM | POA: Diagnosis not present

## 2016-11-07 DIAGNOSIS — M9902 Segmental and somatic dysfunction of thoracic region: Secondary | ICD-10-CM | POA: Diagnosis not present

## 2016-11-07 DIAGNOSIS — M62830 Muscle spasm of back: Secondary | ICD-10-CM | POA: Diagnosis not present

## 2016-11-07 DIAGNOSIS — M4605 Spinal enthesopathy, thoracolumbar region: Secondary | ICD-10-CM | POA: Diagnosis not present

## 2016-11-11 ENCOUNTER — Telehealth: Payer: Self-pay

## 2016-11-11 NOTE — Telephone Encounter (Signed)
Medication refill requests - Faxes received requesting 90 day supplies for patient's Topiramate and Quetiapine.  Patient returns for evaluation on 11/19/16.  Last seen 10/10/16.  Requesting 90 day refill orders at patient's upcoming appointment.

## 2016-11-18 ENCOUNTER — Other Ambulatory Visit: Payer: Self-pay | Admitting: Obstetrics and Gynecology

## 2016-11-18 ENCOUNTER — Ambulatory Visit
Admission: RE | Admit: 2016-11-18 | Discharge: 2016-11-18 | Disposition: A | Discharge status: Home / Self Care | Payer: Medicare HMO | Source: Ambulatory Visit | Attending: Obstetrics and Gynecology | Admitting: Obstetrics and Gynecology

## 2016-11-18 DIAGNOSIS — Z1231 Encounter for screening mammogram for malignant neoplasm of breast: Secondary | ICD-10-CM

## 2016-11-19 ENCOUNTER — Encounter: Payer: Self-pay | Admitting: Psychiatry

## 2016-11-19 ENCOUNTER — Ambulatory Visit (INDEPENDENT_AMBULATORY_CARE_PROVIDER_SITE_OTHER): Payer: Commercial Managed Care - HMO | Admitting: Psychiatry

## 2016-11-19 ENCOUNTER — Ambulatory Visit (INDEPENDENT_AMBULATORY_CARE_PROVIDER_SITE_OTHER): Payer: Commercial Managed Care - HMO | Admitting: Licensed Clinical Social Worker

## 2016-11-19 VITALS — BP 104/68 | HR 88 | Ht 67.5 in | Wt 228.0 lb

## 2016-11-19 DIAGNOSIS — F4001 Agoraphobia with panic disorder: Secondary | ICD-10-CM

## 2016-11-19 DIAGNOSIS — F39 Unspecified mood [affective] disorder: Secondary | ICD-10-CM | POA: Diagnosis not present

## 2016-11-19 MED ORDER — DULOXETINE HCL 30 MG PO CPEP: 30.0000 mg | ORAL_CAPSULE | Freq: Two times a day (BID) | ORAL | 2 refills | Status: DC

## 2016-11-19 MED ORDER — QUETIAPINE FUMARATE 50 MG PO TABS: 50.0000 mg | ORAL_TABLET | Freq: Every day | ORAL | 2 refills | Status: DC

## 2016-11-19 MED ORDER — TOPIRAMATE 100 MG PO TABS: 150.0000 mg | ORAL_TABLET | Freq: Every day | ORAL | 2 refills | Status: DC

## 2016-11-19 MED ORDER — GABAPENTIN 300 MG PO CAPS: ORAL_CAPSULE | ORAL | 2 refills | Status: DC

## 2016-11-19 NOTE — Progress Notes (Signed)
Psychiatric MD Progress Note   Patient Identification: Mary Koch MRN:  [id number] Date of Evaluation:  11/19/2016 Referral Source: [facility] Chief Complaint:   Chief Complaint    Follow-up     Visit Diagnosis:    ICD-9-CM ICD-10-CM   1. Episodic mood disorder ([place]) 296.90 F39   2. Panic disorder with agoraphobia and mild panic attacks 300.21 F40.01     History of Present Illness:   Patient is a 45 year old female who is currently separated from  her husband presented for follow up. She reported that she has been doing well and has been coming for therapy on a regular basis patient reported that she just had her barely physical exam and had her mammogram yesterday. She reported that she is compliant with her medications. She is also losing weight. She reported that she is taking phentermine for 3 days and is also taking Topamax on a regular basis. She appeared calm and alert during the interview. She reported that she is trying to be civil with her ex-husband around the holidays. Her kids are with her at this time. She appeared alert during the interview. She denied having any suicidal ideations or plans. She reported that she is sleeping better with the help of the medications. No acute issues noted at this time.    She denied having any perceptual disturbances. She is also following with her therapist Elmyra Ricks on a monthly basis.    Associated Signs/Symptoms: Depression Symptoms:  fatigue, hopelessness, anxiety, loss of energy/fatigue, weight gain, increased appetite, (Hypo) Manic Symptoms:  Flight of Ideas, Impulsivity, Irritable Mood, Labiality of Mood, Anxiety Symptoms:  Excessive Worry, Psychotic Symptoms:  none PTSD Symptoms: Negative NA  Past Psychiatric History:  H/o Suicide attempt age 89- OD Tylenol - Not admitted to hospital Following [facility] and was diagnosed with depression and panic disorder.   Previous Psychotropic  Medications:  Paxil Luvox Klonopin zoloft seroquel  Substance Abuse History in the last 12 months:  No.  Consequences of Substance Abuse: Negative NA  Past Medical History:  Past Medical History:  Diagnosis Date  . Allergic rhinitis   . Anemia   . Anxiety   . Anxiety and depression   . Carpal tunnel syndrome   . Depression   . Fibroids   . GERD (gastroesophageal reflux disease)   . Headache(784.0)   . Headache(784.0)   . Heavy periods   . Hyperthyroidism    right portion of thyroid removed  . Muscle spasm   . Neurological disorder    evaluation for ms  . Painful menstrual periods   . Shortness of breath    when i smoke a lot    Past Surgical History:  Procedure Laterality Date  . BREAST BIOPSY Left    neg  . CESAREAN SECTION     times 2  . CHOLECYSTECTOMY    . THYROID LOBECTOMY      Family Psychiatric History:  Father- PTSD  Due to agent Orange exposure Brother- Alcoholic  No history of suicide attempts in the family   Family History:  Family History  Problem Relation Age of Onset  . Osteoporosis Mother   . Diabetes Father   . Anxiety disorder Father   . Depression Father   . Post-traumatic stress disorder Father   . Heart disease Father   . Anxiety disorder Sister   . Alcohol abuse Brother   . Breast cancer Neg Hx   . Colon cancer Neg Hx  Social History:   Social History   Social History  . Marital status: Legally Separated    Spouse name: N/A  . Number of children: N/A  . Years of education: N/A   Social History Main Topics  . Smoking status: Current Every Day Smoker    Packs/day: 1.50    Years: 25.00    Types: Cigarettes    Start date: 05/22/1986  . Smokeless tobacco: Never Used  . Alcohol use No  . Drug use: No  . Sexual activity: Yes    Partners: Male    Birth control/ protection: Surgical   Other Topics Concern  . None   Social History Narrative  . None    Additional Social History:  Married in 2002. Seperated in  2013.  Living with  2 boys 5 and 30. 43 year old has been diagnosed with autism He pays child support and she is getting help from her family.  She is currently on disability   Allergies:   Allergies  Allergen Reactions  . Azithromycin Other (See Comments)    headache  . Codeine Other (See Comments)    unknown  . Penicillins Other (See Comments)    headaches    Metabolic Disorder Labs: No results found for: HGBA1C, MPG No results found for: PROLACTIN No results found for: CHOL, TRIG, HDL, CHOLHDL, VLDL, LDLCALC   Current Medications: Current Outpatient Prescriptions  Medication Sig Dispense Refill  . albuterol (PROVENTIL HFA;VENTOLIN HFA) 108 (90 Base) MCG/ACT inhaler INHALE 1 TO 2 PUFFS DAILY    . cyclobenzaprine (FLEXERIL) 10 MG tablet Take 10 mg by mouth 3 (three) times daily as needed for muscle spasms.    . DULoxetine (CYMBALTA) 30 MG capsule Take 1 capsule (30 mg total) by mouth 2 (two) times daily. 60 capsule 2  . fluticasone (FLONASE) 50 MCG/ACT nasal spray Place 1 spray into both nostrils daily.  4  . Fluticasone Furoate-Vilanterol (BREO ELLIPTA) 100-25 MCG/INH AEPB INHALE 1 PUFF BY MOUTH ONCE DAILY    . gabapentin (NEURONTIN) 300 MG capsule 2 pills in am and 1 pill at night 90 capsule 2  . MedroxyPROGESTERone Acetate 150 MG/ML SUSY Inject 1 mL (150 mg total) into the muscle every 3 (three) months. 3 Syringe 0  . nystatin-triamcinolone (MYCOLOG II) cream Apply 1 application topically 2 (two) times daily. 30 g 1  . omeprazole (PRILOSEC) 40 MG capsule Take 40 mg by mouth daily.    . phentermine (ADIPEX-P) 37.5 MG tablet Take 37.5 mg by mouth daily.  2  . QUEtiapine (SEROQUEL) 50 MG tablet Take 1 tablet (50 mg total) by mouth at bedtime. 30 tablet 2  . rosuvastatin (CRESTOR) 20 MG tablet Take 20 mg by mouth daily.    Marland Kitchen topiramate (TOPAMAX) 100 MG tablet Take 1.5 tablets (150 mg total) by mouth daily. 45 tablet 2  . tretinoin (RETIN-A) 0.1 % cream APPLY TO AFFECTED AREA AT  BEDTIME  2  . VENTOLIN HFA 108 (90 BASE) MCG/ACT inhaler INHALE 1 TO 2 PUFFS DAILY  5   No current facility-administered medications for this visit.     Neurologic: Headache: Yes Seizure: No Paresthesias:No  Musculoskeletal: Strength & Muscle Tone: within normal limits Gait & Station: normal Patient leans: N/A  Psychiatric Specialty Exam: ROS  Blood pressure 104/68, pulse 88, height 5' 7.5" (1.715 m), weight 228 lb (103.4 kg).Body mass index is 35.18 kg/m.  General Appearance: Casual and Disheveled  Eye Contact:  Fair  Speech:  Clear and Coherent and Normal Rate  Volume:  Normal  Mood:  Dysphoric  Affect:  Congruent  Thought Process:  Goal Directed  Orientation:  Full (Time, Place, and Person)  Thought Content:  WDL  Suicidal Thoughts:  No  Homicidal Thoughts:  No  Memory:  Immediate;   Fair Recent;   Fair Remote;   Fair  Judgement:  Fair  Insight:  Good  Psychomotor Activity:  Normal  Concentration:  Concentration: Fair and Attention Span: Fair  Recall:  [employer] of Knowledge:Fair  Language: Fair  Akathisia:  No  Handed:  Right  AIMS (if indicated):    Assets:  Communication Skills Desire for Improvement Intimacy Physical Health Social Support Transportation  ADL's:  Intact  Cognition: WNL  Sleep:  6    Treatment Plan Summary: Medication management   Discussed with patient at length about the medications treatment risk benefits and alternatives Continue  Seroquel 50 mg by mouth daily at bedtime Continue Topamax 150 mg by mouth daily and she agreed with the plan. Continue Cymbalta 30 mg by mouth twice a day Also takes Neurontin 600 mg in the morning and 300 at bedtime. Continue with her therapy appointments.  Follow-up in 1 months   More than 50% of the time spent in psychoeducation, counseling and coordination of care.    This note was generated in part or whole with voice recognition software. Voice regonition is usually quite accurate but  there are transcription errors that can and very often do occur. I apologize for any typographical errors that were not detected and corrected.    Rainey Pines, MD 11/21/20171:53 PM

## 2016-11-27 NOTE — Progress Notes (Signed)
   THERAPIST PROGRESS NOTE  Session Time: 74mn  Participation Level: Minimal  Behavioral Response: CasualAlertDepressed  Type of Therapy: Individual Therapy  Treatment Goals addressed: Coping  Interventions: CBT, Motivational Interviewing and Solution Focused  Summary: Mary Koch is a 45 y.o. female who presents with continued symptoms of her diagnosis.  Patient reports a poor mood.  Patient states that on a 1-10 scale her mood is a 5.  Patient reports that she has not moved yet but she is having second thoughts due to finances.  Patient reports that she does not have plans for the upcoming holiday.  Patient reports that her children are mean and rude to her.  Discussion of her parenting style and small changes that she can make that will impact her children's behavior.  Discussion of coping skills that are working and encouraged her to continue those skills along with meditation and yoga.  Role played with Patient breathing techniques.   Suicidal/Homicidal: No  Therapist Response: LCSW provided Patient with ongoing emotional support and encouragement.  Normalized her feelings.  Commended Patient on her progress and reinforced the importance of client staying focused on her own strengths and resources and resiliency. Processed various strategies for dealing with stressors.   Plan: Return again in 2 weeks.  Diagnosis: Axis I: Mood Disorder NOS    Axis II: No diagnosis    Lubertha South, LCSW 11/19/2016

## 2016-12-04 ENCOUNTER — Telehealth: Payer: Self-pay

## 2016-12-04 NOTE — Telephone Encounter (Signed)
received a fax requesting a 90 day supply for the quetiapine fumarate 50mg 

## 2016-12-04 NOTE — Telephone Encounter (Signed)
left message that it was ok to do a 90 day supply with no additional refills and to make sure that the orginial rx for 30 with 2 refills was voided.

## 2016-12-10 DIAGNOSIS — F329 Major depressive disorder, single episode, unspecified: Secondary | ICD-10-CM | POA: Diagnosis not present

## 2016-12-10 DIAGNOSIS — R12 Heartburn: Secondary | ICD-10-CM | POA: Diagnosis not present

## 2016-12-10 DIAGNOSIS — Z8719 Personal history of other diseases of the digestive system: Secondary | ICD-10-CM | POA: Diagnosis not present

## 2016-12-10 DIAGNOSIS — F418 Other specified anxiety disorders: Secondary | ICD-10-CM | POA: Diagnosis not present

## 2016-12-18 ENCOUNTER — Ambulatory Visit: Payer: Commercial Managed Care - HMO | Admitting: Psychiatry

## 2016-12-18 ENCOUNTER — Ambulatory Visit: Payer: Self-pay | Admitting: Licensed Clinical Social Worker

## 2017-01-06 ENCOUNTER — Ambulatory Visit (INDEPENDENT_AMBULATORY_CARE_PROVIDER_SITE_OTHER): Payer: Commercial Managed Care - HMO | Admitting: Obstetrics and Gynecology

## 2017-01-06 VITALS — BP 130/78 | HR 102 | Ht 67.5 in | Wt 231.2 lb

## 2017-01-06 DIAGNOSIS — Z3042 Encounter for surveillance of injectable contraceptive: Secondary | ICD-10-CM | POA: Diagnosis not present

## 2017-01-06 MED ORDER — MEDROXYPROGESTERONE ACETATE 150 MG/ML IM SUSP
150.0000 mg | Freq: Once | INTRAMUSCULAR | Status: AC
  Administered 2017-01-06: 150 mg via INTRAMUSCULAR

## 2017-01-06 NOTE — Progress Notes (Signed)
Pt is here for a depo inj.

## 2017-01-07 DIAGNOSIS — K210 Gastro-esophageal reflux disease with esophagitis: Secondary | ICD-10-CM | POA: Diagnosis not present

## 2017-01-07 DIAGNOSIS — F329 Major depressive disorder, single episode, unspecified: Secondary | ICD-10-CM | POA: Diagnosis not present

## 2017-01-07 DIAGNOSIS — E8881 Metabolic syndrome: Secondary | ICD-10-CM | POA: Diagnosis not present

## 2017-01-07 DIAGNOSIS — E669 Obesity, unspecified: Secondary | ICD-10-CM | POA: Diagnosis not present

## 2017-01-09 ENCOUNTER — Ambulatory Visit (INDEPENDENT_AMBULATORY_CARE_PROVIDER_SITE_OTHER): Payer: Commercial Managed Care - HMO | Admitting: Licensed Clinical Social Worker

## 2017-01-09 DIAGNOSIS — F39 Unspecified mood [affective] disorder: Secondary | ICD-10-CM

## 2017-01-17 ENCOUNTER — Ambulatory Visit: Payer: Commercial Managed Care - HMO | Admitting: Psychiatry

## 2017-01-22 NOTE — Progress Notes (Signed)
   THERAPIST PROGRESS NOTE  Session Time: 95min  Participation Level: Active  Behavioral Response: Casual and NeatAlertEuphoric  Type of Therapy: Individual Therapy  Treatment Goals addressed: Anger  Interventions: CBT and Motivational Interviewing  Summary: Mary Koch is a 46 y.o. female who presents with continued symptoms of her diagnosis.  Patient reports a 6 on a 1-10 scale.  Patient reports that she is unsure how to improve her number.  Patient reports that she has been having difficulty with parenting her children.  She reports that her oldest son does not listen to her and will ignore her directives.  She reports that her youngest son is autistic and aggressive.  She reports that her doctor told her to take a parenting class.  Patient reports that her youngest son receives [facility] services at home and school.  Patient reports that she is minmally involved in her son's treatment.  Patient reports that her main focus is on moving out of her mother's home into her own place. Patient reports that she will rent a 3 bedroom home in the next few weeks.  Patient reports that she has income but hopes that her estranged husband will help financially.  Discussion on utilizing various techniques such as the colored clock to help her youngest son with time management and taking away privileges with her eldest son.   Suicidal/Homicidal: No  Therapist Response: LCSW provided Patient with ongoing emotional support and encouragement.  Normalized her feelings.  Processed various strategies for dealing with stressors.    Plan: Return again in 2 weeks.  Diagnosis: Axis I: Bipolar    Axis II: No diagnosis    Lubertha South, LCSW 01/22/2017

## 2017-01-23 ENCOUNTER — Ambulatory Visit (INDEPENDENT_AMBULATORY_CARE_PROVIDER_SITE_OTHER): Payer: Commercial Managed Care - HMO | Admitting: Licensed Clinical Social Worker

## 2017-01-23 DIAGNOSIS — F39 Unspecified mood [affective] disorder: Secondary | ICD-10-CM

## 2017-01-28 NOTE — Progress Notes (Signed)
   THERAPIST PROGRESS NOTE  Session Time: 69min  Participation Level: Active  Behavioral Response: CasualAlertDepressed  Type of Therapy: Individual Therapy  Treatment Goals addressed: Coping  Interventions: CBT and Reframing  Summary: Mary Koch is a 46 y.o. female who presents with continued symptoms of her diagnosis.  Patient reports that she has been mad lately.  She denies any triggers.  She states that the house that she wants to rent is still not ready.  SHe reports that she put a lot of time and effort into the renovations.  She reports that she is out a lot of money and wants it back from the owner.  She reports that she is having difficulty with other people attempting to discipline her children.    Suicidal/Homicidal: No  Therapist Response: Assessed ptt current functioning per pt report.  Processed w/pt how unresolved thoughts and feelings have an  impact having on mood.  Explored w/pt other barriers to wellness and encouraged pt to make changes for self  Plan: Return again in2 weeks.  Diagnosis: Axis I: Mood Disorder    Axis II: No diagnosis    Lubertha South, LCSW 01/23/2017

## 2017-02-14 DIAGNOSIS — G43119 Migraine with aura, intractable, without status migrainosus: Secondary | ICD-10-CM | POA: Diagnosis not present

## 2017-02-14 DIAGNOSIS — G5603 Carpal tunnel syndrome, bilateral upper limbs: Secondary | ICD-10-CM | POA: Diagnosis not present

## 2017-02-14 DIAGNOSIS — Z6836 Body mass index (BMI) 36.0-36.9, adult: Secondary | ICD-10-CM | POA: Diagnosis not present

## 2017-02-19 ENCOUNTER — Ambulatory Visit (INDEPENDENT_AMBULATORY_CARE_PROVIDER_SITE_OTHER): Payer: Commercial Managed Care - HMO | Admitting: Licensed Clinical Social Worker

## 2017-02-19 ENCOUNTER — Ambulatory Visit: Payer: Commercial Managed Care - HMO | Admitting: Psychiatry

## 2017-02-19 DIAGNOSIS — F39 Unspecified mood [affective] disorder: Secondary | ICD-10-CM

## 2017-02-19 NOTE — Progress Notes (Signed)
   THERAPIST PROGRESS NOTE  Session Time: 62min  Participation Level: Active  Behavioral Response: CasualAlertDepressed  Type of Therapy: Individual Therapy  Treatment Goals addressed: Coping  Interventions: CBT and Reframing  Summary: Mary Koch is a 47 y.o. female who presents with continued symptoms of her diagnosis.  Patient reports that she feels depressed.  She states, "Nothing is going right." Reports that she recently moved into a new residence.  She reports several problems in the home such as: limited amount of heat (using electric fire places), no cold water.  Reports that she has to perform basic hygiene at someone else's house. Reports that she has difficulty with sleeping due to stress.  Assisted Patient with problem solving skills and communication skills. Role played effective communication skills.  Suicidal/Homicidal: No  Therapist Response: LCSW provided Patient with ongoing emotional support and encouragement.  Normalized her feelings.  Commended Patient on her progress and reinforced the importance of client staying focused on her own strengths and resources and resiliency. Processed various strategies for dealing with stressors.    Plan: Return again in2 weeks.  Diagnosis: Axis I: Mood Disorder    Axis II: No diagnosis    Lubertha South, LCSW 02/19/2017

## 2017-03-09 ENCOUNTER — Other Ambulatory Visit: Payer: Self-pay | Admitting: Obstetrics and Gynecology

## 2017-03-09 DIAGNOSIS — N921 Excessive and frequent menstruation with irregular cycle: Secondary | ICD-10-CM

## 2017-03-10 ENCOUNTER — Ambulatory Visit: Payer: Self-pay

## 2017-03-12 ENCOUNTER — Ambulatory Visit (INDEPENDENT_AMBULATORY_CARE_PROVIDER_SITE_OTHER): Payer: Commercial Managed Care - HMO | Admitting: Psychiatry

## 2017-03-12 ENCOUNTER — Encounter: Payer: Self-pay | Admitting: Psychiatry

## 2017-03-12 VITALS — BP 120/79 | HR 101 | Temp 97.8°F | Wt 236.0 lb

## 2017-03-12 DIAGNOSIS — F39 Unspecified mood [affective] disorder: Secondary | ICD-10-CM

## 2017-03-12 DIAGNOSIS — F4001 Agoraphobia with panic disorder: Secondary | ICD-10-CM | POA: Diagnosis not present

## 2017-03-12 MED ORDER — QUETIAPINE FUMARATE 50 MG PO TABS: 50.0000 mg | ORAL_TABLET | Freq: Every day | ORAL | 2 refills | Status: DC

## 2017-03-12 MED ORDER — TOPIRAMATE 100 MG PO TABS: 150.0000 mg | ORAL_TABLET | Freq: Every day | ORAL | 2 refills | Status: DC

## 2017-03-12 MED ORDER — DULOXETINE HCL 30 MG PO CPEP: 30.0000 mg | ORAL_CAPSULE | Freq: Two times a day (BID) | ORAL | 2 refills | Status: DC

## 2017-03-12 MED ORDER — GABAPENTIN 300 MG PO CAPS: ORAL_CAPSULE | ORAL | 2 refills | Status: DC

## 2017-03-12 NOTE — Progress Notes (Signed)
Psychiatric MD Progress Note   Patient Identification: Mary Koch MRN:  [id number] Date of Evaluation:  03/12/2017 [facility] Chief Complaint:   Chief Complaint    Follow-up; Medication Refill     Visit Diagnosis:    ICD-9-CM ICD-10-CM   1. Episodic mood disorder ([place]) 296.90 F39   2. Panic disorder with agoraphobia and mild panic attacks 300.21 F40.01     History of Present Illness:   Patient is a 46 year old female who is currently separated from  her husband presented for follow up accompanied by her son. She reported that she has been doing well and has been coming for therapy on a regular basis. Patient reported that she has been feeling more depressed since she has moved into her own place with her sons and has been having issues related to the basic utilities including water and heat. She is talking to the owner of the house. Patient reported that she has been trying to lose weight and is getting the phentermine from her primary care physician Dr. Arlyn Dunning. Marland Kitchen She was initially losing the weight but now has gained weight again. Patient appeared calm and alert during the interview. She reported that she has been compliant with her medications. She also takes Topamax on a regular basis. She currently denied having any suicidal ideations or plans. We discussed about her medications in detail.  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 We discussed about placing her ninth grade son in the high school and she agreed with the plan.   She stated that she has been compliant with her medications. She denied having any perceptual disturbances. She denied having any suicidal homicidal ideations or plans.   Associated Signs/Symptoms: Depression Symptoms:  hopelessness, anxiety, weight gain, increased appetite, (Hypo) Manic Symptoms:  Flight of Ideas, Impulsivity, Irritable Mood, Labiality of Mood, Anxiety Symptoms:  Excessive  Worry, Psychotic Symptoms:  none PTSD Symptoms: Negative NA  Past Psychiatric History:  H/o Suicide attempt age 35- OD Tylenol - Not admitted to hospital Following [facility] and was diagnosed with depression and panic disorder.   Previous Psychotropic Medications:  Paxil Luvox Klonopin zoloft seroquel  Substance Abuse History in the last 12 months:  No.  Consequences of Substance Abuse: Negative NA  Past Medical History:  Past Medical History:  Diagnosis Date  . Allergic rhinitis   . Anemia   . Anxiety   . Anxiety and depression   . Carpal tunnel syndrome   . Depression   . Fibroids   . GERD (gastroesophageal reflux disease)   . Headache(784.0)   . Headache(784.0)   . Heavy periods   . Hyperthyroidism    right portion of thyroid removed  . Muscle spasm   . Neurological disorder    evaluation for ms  . Painful menstrual periods   . Shortness of breath    when i smoke a lot    Past Surgical History:  Procedure Laterality Date  . BREAST BIOPSY Left    neg  . carpel tunnel Left 2017  . CESAREAN SECTION     times 2  . CHOLECYSTECTOMY    . THYROID LOBECTOMY      Family Psychiatric History:  Father- PTSD  Due to agent Orange exposure Brother- Alcoholic  No history of suicide attempts in the family   Family History:  Family History  Problem Relation Age of Onset  . Osteoporosis Mother   . Diabetes Father   .  Anxiety disorder Father   . Depression Father   . Post-traumatic stress disorder Father   . Heart disease Father   . Anxiety disorder Sister   . Alcohol abuse Brother   . Breast cancer Neg Hx   . Colon cancer Neg Hx     Social History:   Social History   Social History  . Marital status: Legally Separated    Spouse name: N/A  . Number of children: N/A  . Years of education: N/A   Social History Main Topics  . Smoking status: Current Every Day Smoker    Packs/day: 1.50    Years: 25.00    Types: Cigarettes    Start  date: 05/22/1986  . Smokeless tobacco: Never Used  . Alcohol use No  . Drug use: No  . Sexual activity: Yes    Partners: Male    Birth control/ protection: Surgical   Other Topics Concern  . None   Social History Narrative  . None    Additional Social History:  Married in 2002. Seperated in 2013.  Living with  2 boys 87 and 21. 44 year old has been diagnosed with autism He pays child support and she is getting help from her family.  She is currently on disability   Allergies:   Allergies  Allergen Reactions  . Azithromycin Other (See Comments)    headache  . Codeine Other (See Comments)    unknown  . Penicillins Other (See Comments)    headaches    Metabolic Disorder Labs: No results found for: HGBA1C, MPG No results found for: PROLACTIN No results found for: CHOL, TRIG, HDL, CHOLHDL, VLDL, LDLCALC   Current Medications: Current Outpatient Prescriptions  Medication Sig Dispense Refill  . albuterol (PROVENTIL HFA;VENTOLIN HFA) 108 (90 Base) MCG/ACT inhaler INHALE 1 TO 2 PUFFS DAILY    . cyclobenzaprine (FLEXERIL) 10 MG tablet Take 10 mg by mouth 3 (three) times daily as needed for muscle spasms.    . DULoxetine (CYMBALTA) 30 MG capsule Take 1 capsule (30 mg total) by mouth 2 (two) times daily. 60 capsule 2  . fluticasone (FLONASE) 50 MCG/ACT nasal spray Place 1 spray into both nostrils daily.  4  . Fluticasone Furoate-Vilanterol (BREO ELLIPTA) 100-25 MCG/INH AEPB INHALE 1 PUFF BY MOUTH ONCE DAILY    . gabapentin (NEURONTIN) 300 MG capsule 2 pills in am and 1 pill at night 90 capsule 2  . MedroxyPROGESTERone Acetate 150 MG/ML SUSY INJECT 1 ML (150 MG TOTAL) INTO THE MUSCLE EVERY 3 (THREE) MONTHS.  0  . MedroxyPROGESTERone Acetate 150 MG/ML SUSY INJECT 1 ML (150 MG TOTAL) INTO THE MUSCLE EVERY 3 (THREE) MONTHS. 1 Syringe 2  . nystatin-triamcinolone (MYCOLOG II) cream Apply 1 application topically 2 (two) times daily. 30 g 1  . omeprazole (PRILOSEC) 40 MG capsule Take 40  mg by mouth daily.    . phentermine (ADIPEX-P) 37.5 MG tablet Take 37.5 mg by mouth daily.  2  . QUEtiapine (SEROQUEL) 50 MG tablet Take 1 tablet (50 mg total) by mouth at bedtime. 30 tablet 2  . rosuvastatin (CRESTOR) 20 MG tablet Take 20 mg by mouth daily.    Marland Kitchen topiramate (TOPAMAX) 100 MG tablet Take 1.5 tablets (150 mg total) by mouth daily. 45 tablet 2  . tretinoin (RETIN-A) 0.1 % cream APPLY TO AFFECTED AREA AT BEDTIME  2   No current facility-administered medications for this visit.     Neurologic: Headache: Yes Seizure: No Paresthesias:No  Musculoskeletal: Strength & Muscle Tone: within  normal limits Gait & Station: normal Patient leans: N/A  Psychiatric Specialty Exam: ROS  Blood pressure 120/79, pulse (!) 101, temperature 97.8 F (36.6 C), temperature source Oral, weight 236 lb (107 kg).Body mass index is 36.42 kg/m.  General Appearance: Casual and Disheveled  Eye Contact:  Fair  Speech:  Clear and Coherent and Normal Rate  Volume:  Normal  Mood:  Dysphoric  Affect:  Congruent  Thought Process:  Goal Directed  Orientation:  Full (Time, Place, and Person)  Thought Content:  WDL  Suicidal Thoughts:  No  Homicidal Thoughts:  No  Memory:  Immediate;   Fair Recent;   Fair Remote;   Fair  Judgement:  Fair  Insight:  Good  Psychomotor Activity:  Normal  Concentration:  Concentration: Fair and Attention Span: Fair  Recall:  [employer] of Knowledge:Fair  Language: Fair  Akathisia:  No  Handed:  Right  AIMS (if indicated):    Assets:  Communication Skills Desire for Improvement Intimacy Physical Health Social Support Transportation  ADL's:  Intact  Cognition: WNL  Sleep:  6    Treatment Plan Summary: Medication management   Discussed with patient at length about the medications treatment risk benefits and alternatives Continue  Seroquel 50 mg by mouth daily at bedtime Continue Topamax 150 mg by mouth daily and she agreed with the plan. Continue  Cymbalta 30 mg by mouth twice a day Also takes Neurontin 600 mg in the morning and 300 at bedtime. Continue with her therapy appointments.  Follow-up in 2 months   More than 50% of the time spent in psychoeducation, counseling and coordination of care.    This note was generated in part or whole with voice recognition software. Voice regonition is usually quite accurate but there are transcription errors that can and very often do occur. I apologize for any typographical errors that were not detected and corrected.    Rainey Pines, MD 3/14/201810:35 AM

## 2017-03-14 ENCOUNTER — Ambulatory Visit: Payer: Self-pay

## 2017-03-19 ENCOUNTER — Ambulatory Visit: Payer: Commercial Managed Care - HMO | Admitting: Licensed Clinical Social Worker

## 2017-03-24 ENCOUNTER — Ambulatory Visit (INDEPENDENT_AMBULATORY_CARE_PROVIDER_SITE_OTHER): Payer: Medicare HMO

## 2017-03-24 VITALS — BP 107/75 | HR 98 | Wt 234.0 lb

## 2017-03-24 DIAGNOSIS — Z3042 Encounter for surveillance of injectable contraceptive: Secondary | ICD-10-CM | POA: Diagnosis not present

## 2017-03-24 MED ORDER — MEDROXYPROGESTERONE ACETATE 150 MG/ML IM SUSP
150.0000 mg | Freq: Once | INTRAMUSCULAR | Status: AC
  Administered 2017-03-24: 150 mg via INTRAMUSCULAR

## 2017-03-24 NOTE — Progress Notes (Signed)
Patient ID: Mary Koch, female   DOB: March 02, 1971, 46 y.o.   MRN: [id number] Pt presents for depo-provera injection without any undesirable side effects.

## 2017-04-01 ENCOUNTER — Ambulatory Visit (INDEPENDENT_AMBULATORY_CARE_PROVIDER_SITE_OTHER): Payer: Medicare HMO | Admitting: Licensed Clinical Social Worker

## 2017-04-01 DIAGNOSIS — F39 Unspecified mood [affective] disorder: Secondary | ICD-10-CM

## 2017-04-04 DIAGNOSIS — R12 Heartburn: Secondary | ICD-10-CM | POA: Diagnosis not present

## 2017-04-04 DIAGNOSIS — Z8719 Personal history of other diseases of the digestive system: Secondary | ICD-10-CM | POA: Diagnosis not present

## 2017-04-04 DIAGNOSIS — E8881 Metabolic syndrome: Secondary | ICD-10-CM | POA: Diagnosis not present

## 2017-04-04 DIAGNOSIS — E669 Obesity, unspecified: Secondary | ICD-10-CM | POA: Diagnosis not present

## 2017-04-10 NOTE — Progress Notes (Signed)
   THERAPIST PROGRESS NOTE  Session Time: 72min  Participation Level: Active  Behavioral Response: CasualAlertDepressed  Type of Therapy: Individual Therapy  Treatment Goals addressed: Coping  Interventions: CBT and Reframing  Summary: Mary Koch is a 46 y.o. female who presents with continued symptoms of her diagnosis.  Patient reports that she has been doing better since the previous session.  Patient reports that she is currently struggling financially.  Explored alternatives such as eating at her mother's home.  Patient was able to complete a budget and learned that she has more money going out than coming in.  Patient to decide how to cut back financially  Suicidal/Homicidal: No  Therapist Response: LCSW provided Patient with ongoing emotional support and encouragement.  Normalized her feelings.  Commended Patient on her progress and reinforced the importance of client staying focused on her own strengths and resources and resiliency. Processed various strategies for dealing with stressors.    Plan: Return again in2 weeks.  Diagnosis: Axis I: Mood Disorder    Axis II: No diagnosis    Lubertha South, LCSW 04/04/2017

## 2017-04-28 ENCOUNTER — Telehealth: Payer: Self-pay

## 2017-04-28 NOTE — Telephone Encounter (Signed)
received a fax requesting a 90 day supply for the quetiapine fumarate 50mg  and duloxetine hcl dr 30mg  .  pt was last seen on  03-12-17 next appt  05-12-17

## 2017-05-06 ENCOUNTER — Telehealth: Payer: Self-pay

## 2017-05-06 NOTE — Telephone Encounter (Signed)
received two faxes requesting a 90 day supply on  quetiapine 50mg  and duloxetine 30mg .  pt was last seen on 03-12-17 next appt 05-12-17

## 2017-05-12 ENCOUNTER — Ambulatory Visit: Payer: Commercial Managed Care - HMO | Admitting: Psychiatry

## 2017-05-12 ENCOUNTER — Ambulatory Visit: Payer: Medicare HMO | Admitting: Licensed Clinical Social Worker

## 2017-05-14 ENCOUNTER — Encounter: Payer: Self-pay | Admitting: Psychiatry

## 2017-05-14 ENCOUNTER — Ambulatory Visit (INDEPENDENT_AMBULATORY_CARE_PROVIDER_SITE_OTHER): Payer: Medicare HMO | Admitting: Psychiatry

## 2017-05-14 ENCOUNTER — Other Ambulatory Visit: Payer: Self-pay | Admitting: Psychiatry

## 2017-05-14 VITALS — BP 121/85 | HR 89 | Temp 98.7°F | Wt 238.4 lb

## 2017-05-14 DIAGNOSIS — F39 Unspecified mood [affective] disorder: Secondary | ICD-10-CM

## 2017-05-14 DIAGNOSIS — F4001 Agoraphobia with panic disorder: Secondary | ICD-10-CM | POA: Diagnosis not present

## 2017-05-14 MED ORDER — QUETIAPINE FUMARATE 50 MG PO TABS: 50.0000 mg | ORAL_TABLET | Freq: Every day | ORAL | 2 refills | Status: DC

## 2017-05-14 MED ORDER — DULOXETINE HCL 30 MG PO CPEP: 30.0000 mg | ORAL_CAPSULE | Freq: Two times a day (BID) | ORAL | 2 refills | Status: DC

## 2017-05-14 MED ORDER — GABAPENTIN 300 MG PO CAPS: ORAL_CAPSULE | ORAL | 2 refills | Status: DC

## 2017-05-14 MED ORDER — TOPIRAMATE 100 MG PO TABS: 150.0000 mg | ORAL_TABLET | Freq: Every day | ORAL | 2 refills | Status: DC

## 2017-05-14 NOTE — Progress Notes (Signed)
Psychiatric MD Progress Note   Patient Identification: Mary Koch MRN:  [id number] Date of Evaluation:  05/14/2017 Referral Source: [facility] Chief Complaint:   Chief Complaint    Follow-up; Medication Refill     Visit Diagnosis:    ICD-9-CM ICD-10-CM   1. Episodic mood disorder ([place]) 296.90 F39   2. Panic disorder with agoraphobia and mild panic attacks 300.21 F40.01     History of Present Illness:   Patient is a 46 year old female who is currently separated from  her husband presented for follow up. She reported that she has been Gaining weight and is concerned about the same. She reported that she has been taking her medications. 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 She reported that she has been trying to lose weight. We discussed about her diet and exercise. She is motivated to lose weight. She feels that the current combination of medications helping her. She denied having any side effects to medications. She denied having any more swings anger anxiety or paranoia. She denied having any perceptual disturbances. She appeared motivated to start doing more exercise at this time.    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 We discussed about placing her ninth grade son in the high school and she agreed with the plan.   She stated that she has been compliant with her medications. She denied having any perceptual disturbances. She denied having any suicidal homicidal ideations or plans.   Associated Signs/Symptoms: Depression Symptoms:  hopelessness, anxiety, weight gain, increased appetite, (Hypo) Manic Symptoms:  Flight of Ideas, Impulsivity, Irritable Mood, Labiality of Mood, Anxiety Symptoms:  Excessive Worry, Psychotic Symptoms:  none PTSD Symptoms: Negative NA  Past Psychiatric History:  H/o Suicide attempt age 34- OD Tylenol - Not admitted to hospital Following [facility] and was diagnosed with depression and panic disorder.   Previous Psychotropic Medications:  Paxil Luvox Klonopin zoloft seroquel  Substance Abuse History in the last 12 months:  No.  Consequences of Substance Abuse: Negative NA  Past Medical History:  Past Medical History:  Diagnosis Date  . Allergic rhinitis   . Anemia   . Anxiety   . Anxiety and depression   . Carpal tunnel syndrome   . Depression   . Fibroids   . GERD (gastroesophageal reflux disease)   . Headache(784.0)   . Headache(784.0)   . Heavy periods   . Hyperthyroidism    right portion of thyroid removed  . Muscle spasm   . Neurological disorder    evaluation for ms  . Painful menstrual periods   . Shortness of breath    when i smoke a lot    Past Surgical History:  Procedure Laterality Date  . BREAST BIOPSY Left    neg  . carpel tunnel Left 2017  . CESAREAN SECTION     times 2  . CHOLECYSTECTOMY    . THYROID LOBECTOMY      Family Psychiatric History:  Father- PTSD  Due to agent Orange exposure Brother- Alcoholic  No history of suicide attempts in the family   Family History:  Family History  Problem Relation Age of Onset  . Osteoporosis Mother   . Diabetes Father   . Anxiety disorder Father   . Depression Father   . Post-traumatic stress disorder Father   . Heart disease Father   . Anxiety disorder Sister   . Alcohol  abuse Brother   . Breast cancer Neg Hx   . Colon cancer Neg Hx     Social History:   Social History   Social History  . Marital status: Legally Separated    Spouse name: N/A  . Number of children: N/A  . Years of education: N/A   Social History Main Topics  . Smoking status: Current Every Day Smoker    Packs/day: 1.50    Years: 25.00    Types: Cigarettes    Start date: 05/22/1986  . Smokeless tobacco: Never Used  . Alcohol use No  . Drug use: No  . Sexual activity: Yes    Partners: Male    Birth control/ protection: Surgical   Other  Topics Concern  . None   Social History Narrative  . None    Additional Social History:  Married in 2002. Seperated in 2013.  Living with  2 boys 28 and 109. 44 year old has been diagnosed with autism He pays child support and she is getting help from her family.  She is currently on disability   Allergies:   Allergies  Allergen Reactions  . Azithromycin Other (See Comments)    headache  . Codeine Other (See Comments)    unknown  . Penicillins Other (See Comments)    headaches    Metabolic Disorder Labs: No results found for: HGBA1C, MPG No results found for: PROLACTIN No results found for: CHOL, TRIG, HDL, CHOLHDL, VLDL, LDLCALC   Current Medications: Current Outpatient Prescriptions  Medication Sig Dispense Refill  . albuterol (PROVENTIL HFA;VENTOLIN HFA) 108 (90 Base) MCG/ACT inhaler INHALE 1 TO 2 PUFFS DAILY    . cyclobenzaprine (FLEXERIL) 10 MG tablet Take 10 mg by mouth 3 (three) times daily as needed for muscle spasms.    . DULoxetine (CYMBALTA) 30 MG capsule Take 1 capsule (30 mg total) by mouth 2 (two) times daily. 60 capsule 2  . fluticasone (FLONASE) 50 MCG/ACT nasal spray Place 1 spray into both nostrils daily.  4  . Fluticasone Furoate-Vilanterol (BREO ELLIPTA) 100-25 MCG/INH AEPB INHALE 1 PUFF BY MOUTH ONCE DAILY    . gabapentin (NEURONTIN) 300 MG capsule 2 pills in am and 1 pill at night 90 capsule 2  . MedroxyPROGESTERone Acetate 150 MG/ML SUSY INJECT 1 ML (150 MG TOTAL) INTO THE MUSCLE EVERY 3 (THREE) MONTHS.  0  . MedroxyPROGESTERone Acetate 150 MG/ML SUSY INJECT 1 ML (150 MG TOTAL) INTO THE MUSCLE EVERY 3 (THREE) MONTHS. 1 Syringe 2  . nystatin-triamcinolone (MYCOLOG II) cream Apply 1 application topically 2 (two) times daily. 30 g 1  . omeprazole (PRILOSEC) 40 MG capsule Take 40 mg by mouth daily.    . phentermine (ADIPEX-P) 37.5 MG tablet Take 37.5 mg by mouth daily.  2  . QUEtiapine (SEROQUEL) 50 MG tablet Take 1 tablet (50 mg total) by mouth at  bedtime. 30 tablet 2  . rosuvastatin (CRESTOR) 20 MG tablet Take 20 mg by mouth daily.    Marland Kitchen topiramate (TOPAMAX) 100 MG tablet Take 1.5 tablets (150 mg total) by mouth daily. 45 tablet 2  . tretinoin (RETIN-A) 0.1 % cream APPLY TO AFFECTED AREA AT BEDTIME  2   No current facility-administered medications for this visit.     Neurologic: Headache: Yes Seizure: No Paresthesias:No  Musculoskeletal: Strength & Muscle Tone: within normal limits Gait & Station: normal Patient leans: N/A  Psychiatric Specialty Exam: ROS  Blood pressure 121/85, pulse 89, temperature 98.7 F (37.1 C), temperature source Oral, weight 238 lb  6.4 oz (108.1 kg).Body mass index is 36.79 kg/m.  General Appearance: Casual and Disheveled  Eye Contact:  Fair  Speech:  Clear and Coherent and Normal Rate  Volume:  Normal  Mood:  Dysphoric  Affect:  Congruent  Thought Process:  Goal Directed  Orientation:  Full (Time, Place, and Person)  Thought Content:  WDL  Suicidal Thoughts:  No  Homicidal Thoughts:  No  Memory:  Immediate;   Fair Recent;   Fair Remote;   Fair  Judgement:  Fair  Insight:  Good  Psychomotor Activity:  Normal  Concentration:  Concentration: Fair and Attention Span: Fair  Recall:  [employer] of Knowledge:Fair  Language: Fair  Akathisia:  No  Handed:  Right  AIMS (if indicated):    Assets:  Communication Skills Desire for Improvement Intimacy Physical Health Social Support Transportation  ADL's:  Intact  Cognition: WNL  Sleep:  6    Treatment Plan Summary: Medication management   Discussed with patient at length about the medications treatment risk benefits and alternatives Continue  Seroquel 50 mg by mouth daily at bedtime Continue Topamax 150 mg by mouth daily and she agreed with the plan. Continue Cymbalta 30 mg by mouth twice a day Also takes Neurontin 600 mg in the morning and 300 at bedtime. Continue with her therapy appointments.  Follow-up in 2 months   More  than 50% of the time spent in psychoeducation, counseling and coordination of care.    This note was generated in part or whole with voice recognition software. Voice regonition is usually quite accurate but there are transcription errors that can and very often do occur. I apologize for any typographical errors that were not detected and corrected.    Rainey Pines, MD 5/16/20183:43 PM

## 2017-05-18 ENCOUNTER — Emergency Department
Admission: EM | Admit: 2017-05-18 | Discharge: 2017-05-18 | Disposition: A | Discharge status: Home / Self Care | Payer: Medicare HMO | Attending: Emergency Medicine | Admitting: Emergency Medicine

## 2017-05-18 ENCOUNTER — Encounter: Payer: Self-pay | Admitting: Emergency Medicine

## 2017-05-18 ENCOUNTER — Emergency Department: Payer: Medicare HMO

## 2017-05-18 DIAGNOSIS — Z79899 Other long term (current) drug therapy: Secondary | ICD-10-CM | POA: Diagnosis not present

## 2017-05-18 DIAGNOSIS — Z7902 Long term (current) use of antithrombotics/antiplatelets: Secondary | ICD-10-CM | POA: Diagnosis not present

## 2017-05-18 DIAGNOSIS — R110 Nausea: Secondary | ICD-10-CM | POA: Diagnosis present

## 2017-05-18 DIAGNOSIS — E0590 Thyrotoxicosis, unspecified without thyrotoxic crisis or storm: Secondary | ICD-10-CM | POA: Insufficient documentation

## 2017-05-18 DIAGNOSIS — R0689 Other abnormalities of breathing: Secondary | ICD-10-CM | POA: Diagnosis not present

## 2017-05-18 DIAGNOSIS — F17210 Nicotine dependence, cigarettes, uncomplicated: Secondary | ICD-10-CM | POA: Diagnosis not present

## 2017-05-18 DIAGNOSIS — Z9049 Acquired absence of other specified parts of digestive tract: Secondary | ICD-10-CM | POA: Diagnosis not present

## 2017-05-18 DIAGNOSIS — J189 Pneumonia, unspecified organism: Secondary | ICD-10-CM

## 2017-05-18 LAB — COMPREHENSIVE METABOLIC PANEL
ALK PHOS: 66 U/L (ref 38–126)
ALT: 18 U/L (ref 14–54)
AST: 24 U/L (ref 15–41)
Albumin: 3.7 g/dL (ref 3.5–5.0)
Anion gap: 7 (ref 5–15)
BUN: 15 mg/dL (ref 6–20)
CALCIUM: 9.2 mg/dL (ref 8.9–10.3)
CO2: 23 mmol/L (ref 22–32)
CREATININE: 0.85 mg/dL (ref 0.44–1.00)
Chloride: 108 mmol/L (ref 101–111)
GFR calc non Af Amer: >60 mL/min (ref >60)
Glucose, Bld: 110 mg/dL — ABNORMAL HIGH (ref 65–99)
Potassium: 4.5 mmol/L (ref 3.5–5.1)
Sodium: 138 mmol/L (ref 135–145)
Total Bilirubin: 0.7 mg/dL (ref 0.3–1.2)
Total Protein: 7.3 g/dL (ref 6.5–8.1)

## 2017-05-18 LAB — CBC
HCT: 40.8 % (ref 35.0–47.0)
Hemoglobin: 13.9 g/dL (ref 12.0–16.0)
MCH: 28.3 pg (ref 26.0–34.0)
MCHC: 34 g/dL (ref 32.0–36.0)
MCV: 83.2 fL (ref 80.0–100.0)
PLATELETS: 251 10*3/uL (ref 150–440)
RBC: 4.9 MIL/uL (ref 3.80–5.20)
RDW: 14.8 % — ABNORMAL HIGH (ref 11.5–14.5)
WBC: 12.8 10*3/uL — AB (ref 3.6–11.0)

## 2017-05-18 LAB — URINALYSIS, COMPLETE (UACMP) WITH MICROSCOPIC
Bilirubin Urine: NEGATIVE
Glucose, UA: NEGATIVE mg/dL
KETONES UR: NEGATIVE mg/dL
Leukocytes, UA: NEGATIVE
Nitrite: NEGATIVE
PH: 5 (ref 5.0–8.0)
Protein, ur: NEGATIVE mg/dL
SPECIFIC GRAVITY, URINE: 1.028 (ref 1.005–1.030)

## 2017-05-18 LAB — LIPASE, BLOOD: Lipase: 13 U/L (ref 11–51)

## 2017-05-18 LAB — POCT PREGNANCY, URINE: Preg Test, Ur: NEGATIVE

## 2017-05-18 MED ORDER — IPRATROPIUM-ALBUTEROL 0.5-2.5 (3) MG/3ML IN SOLN
3.0000 mL | Freq: Once | RESPIRATORY_TRACT | Status: AC
Start: 2017-05-18 — End: 2017-05-18
  Administered 2017-05-18: 3 mL via RESPIRATORY_TRACT
  Filled 2017-05-18: qty 3

## 2017-05-18 MED ORDER — DOXYCYCLINE HYCLATE 100 MG PO TABS
100.0000 mg | ORAL_TABLET | Freq: Once | ORAL | Status: AC
  Administered 2017-05-18: 100 mg via ORAL
  Filled 2017-05-18: qty 1

## 2017-05-18 MED ORDER — PREDNISONE 20 MG PO TABS
60.0000 mg | ORAL_TABLET | Freq: Once | ORAL | Status: AC
  Administered 2017-05-18: 60 mg via ORAL
  Filled 2017-05-18: qty 3

## 2017-05-18 MED ORDER — ONDANSETRON 4 MG PO TBDP: 4.0000 mg | ORAL_TABLET | Freq: Three times a day (TID) | ORAL | 0 refills | Status: DC | PRN

## 2017-05-18 MED ORDER — DOXYCYCLINE HYCLATE 100 MG PO CAPS: 100.0000 mg | ORAL_CAPSULE | Freq: Two times a day (BID) | ORAL | 0 refills | Status: AC

## 2017-05-18 MED ORDER — ONDANSETRON HCL 4 MG/2ML IJ SOLN
4.0000 mg | Freq: Once | INTRAMUSCULAR | Status: DC
Start: 2017-05-18 — End: 2017-05-18
  Filled 2017-05-18: qty 2

## 2017-05-18 MED ORDER — IPRATROPIUM-ALBUTEROL 0.5-2.5 (3) MG/3ML IN SOLN
3.0000 mL | Freq: Once | RESPIRATORY_TRACT | Status: AC
  Administered 2017-05-18: 3 mL via RESPIRATORY_TRACT
  Filled 2017-05-18: qty 3

## 2017-05-18 MED ORDER — PREDNISONE 20 MG PO TABS: 60.0000 mg | ORAL_TABLET | Freq: Every day | ORAL | 0 refills | Status: AC

## 2017-05-18 MED ORDER — ALBUTEROL SULFATE HFA 108 (90 BASE) MCG/ACT IN AERS: 2.0000 | INHALATION_SPRAY | Freq: Four times a day (QID) | RESPIRATORY_TRACT | 2 refills | Status: DC | PRN

## 2017-05-18 MED ORDER — METOCLOPRAMIDE HCL 10 MG PO TABS: 10.0000 mg | ORAL_TABLET | Freq: Three times a day (TID) | ORAL | 0 refills | Status: DC | PRN

## 2017-05-18 MED ORDER — SODIUM CHLORIDE 0.9 % IV BOLUS (SEPSIS)
1000.0000 mL | Freq: Once | INTRAVENOUS | Status: AC
  Administered 2017-05-18: 1000 mL via INTRAVENOUS

## 2017-05-18 NOTE — ED Triage Notes (Signed)
C/O nausea, diarrhea, and breathing troubles since Friday.

## 2017-05-18 NOTE — ED Provider Notes (Signed)
[facility] Emergency Department Provider Note  ____________________________________________  Time seen: Approximately 12:06 PM  I have reviewed the triage vital signs and the nursing notes.   HISTORY  Chief Complaint Nausea and Diarrhea   HPI Mary Koch is a 46 y.o. female with a history of smoking, wheezing, depression, and anxiety who presents for evaluation of cough, nausea, diarrhea, SOB. Patient reports 3 days of a cough productive of green flam. Since yesterday started having multiple daily episodes of watery diarrhea. She has had nausea and dry heaving but no vomiting. She also endorses mild shortness of breath that is worse with exertion. No fever or chills. No chest pain, no abdominal pain, no dysuria or hematuria. She reports that her cough has become severe, and constant.Patient continues to smoke. She has not used her albuterol inhaler at home. No history of C. difficile or recent antibiotic usage.  Past Medical History:  Diagnosis Date  . Allergic rhinitis   . Anemia   . Anxiety   . Anxiety and depression   . Carpal tunnel syndrome   . Depression   . Fibroids   . GERD (gastroesophageal reflux disease)   . Headache(784.0)   . Headache(784.0)   . Heavy periods   . Hyperthyroidism    right portion of thyroid removed  . Muscle spasm   . Neurological disorder    evaluation for ms  . Painful menstrual periods   . Shortness of breath    when i smoke a lot    Patient Active Problem List   Diagnosis Date Noted  . Anxiety and depression 06/25/2015  . Carpal tunnel syndrome 06/25/2015  . Family planning 06/25/2015  . Bloodgood disease 06/25/2015  . Fibroid 06/25/2015  . Acid reflux 06/25/2015  . Excess, menstruation 06/25/2015  . Increased BMI 06/25/2015  . Adiposity 06/25/2015  . Dysmenorrhea 06/25/2015  . Disease of thyroid gland 06/25/2015  . Compulsive tobacco user syndrome 06/25/2015  . Episodic tension type headache  08/23/2014  . Allergic rhinitis 07/23/2013  . D (diarrhea) 01/13/2013  . HLD (hyperlipidemia) 08/31/2011    Past Surgical History:  Procedure Laterality Date  . BREAST BIOPSY Left    neg  . carpel tunnel Left 2017  . CESAREAN SECTION     times 2  . CHOLECYSTECTOMY    . THYROID LOBECTOMY      Prior to Admission medications   Medication Sig Start Date End Date Taking? Authorizing Provider  albuterol (PROVENTIL HFA;VENTOLIN HFA) 108 (90 Base) MCG/ACT inhaler INHALE 1 TO 2 PUFFS DAILY 04/02/15   [provider]  albuterol (PROVENTIL HFA;VENTOLIN HFA) 108 (90 Base) MCG/ACT inhaler Inhale 2 puffs into the lungs every 6 (six) hours as needed for wheezing or shortness of breath. 05/18/17   Alfred Levins, [name], MD  cyclobenzaprine (FLEXERIL) 10 MG tablet Take 10 mg by mouth 3 (three) times daily as needed for muscle spasms.    [provider]  doxycycline (VIBRAMYCIN) 100 MG capsule Take 1 capsule (100 mg total) by mouth 2 (two) times daily. 05/18/17 05/25/17  Rudene Re, MD  DULoxetine (CYMBALTA) 30 MG capsule Take 1 capsule (30 mg total) by mouth 2 (two) times daily. 05/14/17   Rainey Pines, MD  fluticasone (FLONASE) 50 MCG/ACT nasal spray Place 1 spray into both nostrils daily. 05/18/15   [provider]  Fluticasone Furoate-Vilanterol (BREO ELLIPTA) 100-25 MCG/INH AEPB INHALE 1 PUFF BY MOUTH ONCE DAILY 08/12/15   [provider]  gabapentin (NEURONTIN) 300 MG capsule 2  pills in am and 1 pill at night 05/14/17   Rainey Pines, MD  MedroxyPROGESTERone Acetate 150 MG/ML SUSY INJECT 1 ML (150 MG TOTAL) INTO THE MUSCLE EVERY 3 (THREE) MONTHS. 10/17/16   [provider]  MedroxyPROGESTERone Acetate 150 MG/ML SUSY INJECT 1 ML (150 MG TOTAL) INTO THE MUSCLE EVERY 3 (THREE) MONTHS. 03/10/17   Defrancesco, Alanda Slim, MD  metoCLOPramide (REGLAN) 10 MG tablet Take 1 tablet (10 mg total) by mouth every 8 (eight) hours as needed for nausea. 05/18/17 05/21/17  Rudene Re, MD  nystatin-triamcinolone [facility] II) cream Apply 1 application topically 2 (two) times daily. 10/17/16   Defrancesco, Alanda Slim, MD  omeprazole (PRILOSEC) 40 MG capsule Take 40 mg by mouth daily.    [provider]  phentermine (ADIPEX-P) 37.5 MG tablet Take 37.5 mg by mouth daily. 06/07/16   [provider]  predniSONE (DELTASONE) 20 MG tablet Take 3 tablets (60 mg total) by mouth daily. 05/18/17 05/22/17  Rudene Re, MD  QUEtiapine (SEROQUEL) 50 MG tablet Take 1 tablet (50 mg total) by mouth at bedtime. 05/14/17   Rainey Pines, MD  rosuvastatin (CRESTOR) 20 MG tablet Take 20 mg by mouth daily.    [provider]  topiramate (TOPAMAX) 100 MG tablet Take 1.5 tablets (150 mg total) by mouth daily. 05/14/17   Rainey Pines, MD  tretinoin (RETIN-A) 0.1 % cream APPLY TO AFFECTED AREA AT BEDTIME 06/11/16   [provider]    Allergies Azithromycin; Codeine; and Penicillins  Family History  Problem Relation Age of Onset  . Osteoporosis Mother   . Diabetes Father   . Anxiety disorder Father   . Depression Father   . Post-traumatic stress disorder Father   . Heart disease Father   . Anxiety disorder Sister   . Alcohol abuse Brother   . Breast cancer Neg Hx   . Colon cancer Neg Hx     Social History Social History  Substance Use Topics  . Smoking status: Current Every Day Smoker    Packs/day: 1.50    Years: 25.00    Types: Cigarettes    Start date: 05/22/1986  . Smokeless tobacco: Never Used  . Alcohol use No    Review of Systems  Constitutional: Negative for fever. Eyes: Negative for visual changes. ENT: Negative for sore throat. Neck: No neck pain  Cardiovascular: Negative for chest pain. Respiratory: +shortness of breath, cough Gastrointestinal: Negative for abdominal pain, vomiting. + diarrhea. Genitourinary: Negative for dysuria. Musculoskeletal: Negative for back pain. Skin: Negative for rash. Neurological: Negative for  headaches, weakness or numbness. Psych: No SI or HI  ____________________________________________   PHYSICAL EXAM:  VITAL SIGNS: ED Triage Vitals  Enc Vitals Group     BP 05/18/17 0846 (!) 117/59     Pulse Rate 05/18/17 0846 95     Resp 05/18/17 0846 16     Temp 05/18/17 0846 98.3 F (36.8 C)     Temp Source 05/18/17 0846 Oral     SpO2 05/18/17 0846 96 %     Weight 05/18/17 0844 238 lb (108 kg)     Height 05/18/17 0844 5\' 7"  (1.702 m)     Head Circumference --      Peak Flow --      Pain Score 05/18/17 0844 0     Pain Loc --      Pain Edu? --      Excl. in [place]? --     Constitutional: Alert and oriented. Well appearing  and in no apparent distress. HEENT:      Head: Normocephalic and atraumatic.         Eyes: Conjunctivae are normal. Sclera is non-icteric.       Mouth/Throat: Mucous membranes are moist.       Neck: Supple with no signs of meningismus. Cardiovascular: Regular rate and rhythm. No murmurs, gallops, or rubs. 2+ symmetrical distal pulses are present in all extremities. No JVD. Respiratory: Normal respiratory effort. Slightly diminished air movement bilaterally , no wheezing or crackles  Gastrointestinal: Soft, non tender, and non distended with positive bowel sounds. No rebound or guarding. Genitourinary: No CVA tenderness. Musculoskeletal: Nontender with normal range of motion in all extremities. No edema, cyanosis, or erythema of extremities. Neurologic: Normal speech and language. Face is symmetric. Moving all extremities. No gross focal neurologic deficits are appreciated. Skin: Skin is warm, dry and intact. No rash noted. Psychiatric: Mood and affect are normal. Speech and behavior are normal.  ____________________________________________   LABS (all labs ordered are listed, but only abnormal results are displayed)  Labs Reviewed  COMPREHENSIVE METABOLIC PANEL - Abnormal; Notable for the following:       Result Value   Glucose, Bld 110 (*)    All  other components within normal limits  CBC - Abnormal; Notable for the following:    WBC 12.8 (*)    RDW 14.8 (*)    All other components within normal limits  URINALYSIS, COMPLETE (UACMP) WITH MICROSCOPIC - Abnormal; Notable for the following:    Color, Urine AMBER (*)    APPearance TURBID (*)    Hgb urine dipstick MODERATE (*)    Bacteria, UA MANY (*)    Squamous Epithelial / LPF 6-30 (*)    All other components within normal limits  URINE CULTURE  LIPASE, BLOOD  POCT PREGNANCY, URINE   ____________________________________________  EKG  none  ____________________________________________  RADIOLOGY  CXR: New bilateral interstitial edema or infiltrates. ____________________________________________   PROCEDURES  Procedure(s) performed: None Procedures Critical Care performed:  None ____________________________________________   INITIAL IMPRESSION / ASSESSMENT AND PLAN / ED COURSE   46 y.o. female with a history of smoking, wheezing, depression, and anxiety who presents for evaluation of productive cough, nausea, and diarrhea for 3 days. Patient is well-appearing, in no distress, has normal vital signs, she has severely diminished air movement bilaterally with no wheezing or crackles, she is afebrile. We'll give her 3 DuoNeb treatments and start patient on prednisone. We'll give IV fluids and Zofran for the nausea and diarrhea. Her UA shows many bacteria and 6-30 WBCs however patient has no symptoms of a UTI. Therefore we'll send it for culture and hold off treatment at this time.    _________________________ 2:19 PM on 05/18/2017 -----------------------------------------  Chest x-ray concerning for pneumonia. Patient was started on doxycycline. Lung sounds improved with DuoNeb's. We'll also send patient home on prednisone. Recommended close follow-up with primary care doctor.  Pertinent labs & imaging results that were available during my care of the patient were  reviewed by me and considered in my medical decision making (see chart for details).    ____________________________________________   FINAL CLINICAL IMPRESSION(S) / ED DIAGNOSES  Final diagnoses:  Community acquired pneumonia, unspecified laterality      NEW MEDICATIONS STARTED DURING THIS VISIT:  New Prescriptions   ALBUTEROL (PROVENTIL HFA;VENTOLIN HFA) 108 (90 BASE) MCG/ACT INHALER    Inhale 2 puffs into the lungs every 6 (six) hours as needed for wheezing or shortness of breath.  DOXYCYCLINE (VIBRAMYCIN) 100 MG CAPSULE    Take 1 capsule (100 mg total) by mouth 2 (two) times daily.   METOCLOPRAMIDE (REGLAN) 10 MG TABLET    Take 1 tablet (10 mg total) by mouth every 8 (eight) hours as needed for nausea.   PREDNISONE (DELTASONE) 20 MG TABLET    Take 3 tablets (60 mg total) by mouth daily.     Note:  This document was prepared using Dragon voice recognition software and may include unintentional dictation errors.    Alfred Levins, [name], MD 05/18/17 1425

## 2017-05-18 NOTE — ED Notes (Signed)
2 IV unsuccessful attempt by this nurse. Pt tolerated well

## 2017-05-18 NOTE — Discharge Instructions (Signed)

## 2017-05-19 LAB — URINE CULTURE

## 2017-05-20 DIAGNOSIS — F329 Major depressive disorder, single episode, unspecified: Secondary | ICD-10-CM | POA: Diagnosis not present

## 2017-05-20 DIAGNOSIS — Z8719 Personal history of other diseases of the digestive system: Secondary | ICD-10-CM | POA: Diagnosis not present

## 2017-05-20 DIAGNOSIS — J180 Bronchopneumonia, unspecified organism: Secondary | ICD-10-CM | POA: Diagnosis not present

## 2017-05-20 DIAGNOSIS — Z1389 Encounter for screening for other disorder: Secondary | ICD-10-CM | POA: Diagnosis not present

## 2017-05-20 DIAGNOSIS — F419 Anxiety disorder, unspecified: Secondary | ICD-10-CM | POA: Diagnosis not present

## 2017-05-21 ENCOUNTER — Ambulatory Visit: Payer: Medicare HMO | Admitting: Licensed Clinical Social Worker

## 2017-06-04 ENCOUNTER — Ambulatory Visit: Payer: Medicare HMO | Admitting: Psychiatry

## 2017-06-05 DIAGNOSIS — E669 Obesity, unspecified: Secondary | ICD-10-CM | POA: Diagnosis not present

## 2017-06-05 DIAGNOSIS — Z8719 Personal history of other diseases of the digestive system: Secondary | ICD-10-CM | POA: Diagnosis not present

## 2017-06-05 DIAGNOSIS — F329 Major depressive disorder, single episode, unspecified: Secondary | ICD-10-CM | POA: Diagnosis not present

## 2017-06-05 DIAGNOSIS — E8881 Metabolic syndrome: Secondary | ICD-10-CM | POA: Diagnosis not present

## 2017-06-11 ENCOUNTER — Ambulatory Visit (INDEPENDENT_AMBULATORY_CARE_PROVIDER_SITE_OTHER): Payer: Medicare HMO | Admitting: Licensed Clinical Social Worker

## 2017-06-11 DIAGNOSIS — F39 Unspecified mood [affective] disorder: Secondary | ICD-10-CM

## 2017-06-18 NOTE — Progress Notes (Signed)
   THERAPIST PROGRESS NOTE  Session Time: 47min  Participation Level: Active  Behavioral Response: CasualAlertDepressed  Type of Therapy: Individual Therapy  Treatment Goals addressed: Coping  Interventions: CBT and Reframing  Summary: Mary Koch is a 46 y.o. female who presents with continued symptoms of her diagnosis.  Patient reports that she is having difficulty with her mood due to her living conditions.  She reports that he landlord has failed to do everything he promised such as painting and fixing the heating and cooling system.  She reports that he air conditioner is broken and she uses a window unit to cool the home.  She reports that she will begin to keep a tally of the dates that she talks to him about the home and the repairs. She reports that she is having difficulty with her youngest son's behavior.  She reports that he is getting better but his erratic behavior is a problem for her.  She denies him having difficulty while with his father.  Discussion of behavior modification techniques.  Reviewed coping skills  Suicidal/Homicidal: No  Therapist Response: LCSW provided Patient with ongoing emotional support and encouragement.  Normalized her feelings.  Commended Patient on her progress and reinforced the importance of client staying focused on her own strengths and resources and resiliency. Processed various strategies for dealing with stressors.    Plan: Return again in2 weeks.  Diagnosis: Axis I: Mood Disorder    Axis II: No diagnosis    Lubertha South, LCSW 06/11/2017

## 2017-06-23 ENCOUNTER — Ambulatory Visit (INDEPENDENT_AMBULATORY_CARE_PROVIDER_SITE_OTHER): Payer: Medicare HMO | Admitting: Obstetrics and Gynecology

## 2017-06-23 VITALS — BP 116/74 | HR 93 | Ht 67.0 in | Wt 238.0 lb

## 2017-06-23 DIAGNOSIS — Z3042 Encounter for surveillance of injectable contraceptive: Secondary | ICD-10-CM

## 2017-06-23 MED ORDER — MEDROXYPROGESTERONE ACETATE 150 MG/ML IM SUSP
150.0000 mg | Freq: Once | INTRAMUSCULAR | Status: AC
  Administered 2017-06-23: 150 mg via INTRAMUSCULAR

## 2017-06-23 NOTE — Progress Notes (Signed)
Date last pap: 2015 Last Depo-Provera: 03/24/2017 Side Effects if any: None. Serum HCG indicated? No pt within allotted window. Depo-Provera 150 mg IM given by: [place] [facility] in [place]. Pt tolerated well.  Next appointment due: September 10-24

## 2017-06-23 NOTE — Patient Instructions (Signed)

## 2017-06-27 DIAGNOSIS — G4719 Other hypersomnia: Secondary | ICD-10-CM | POA: Diagnosis not present

## 2017-06-27 DIAGNOSIS — G43119 Migraine with aura, intractable, without status migrainosus: Secondary | ICD-10-CM | POA: Diagnosis not present

## 2017-06-27 DIAGNOSIS — Z6836 Body mass index (BMI) 36.0-36.9, adult: Secondary | ICD-10-CM | POA: Diagnosis not present

## 2017-06-27 DIAGNOSIS — M7989 Other specified soft tissue disorders: Secondary | ICD-10-CM | POA: Diagnosis not present

## 2017-06-27 DIAGNOSIS — G5603 Carpal tunnel syndrome, bilateral upper limbs: Secondary | ICD-10-CM | POA: Diagnosis not present

## 2017-07-10 ENCOUNTER — Ambulatory Visit (INDEPENDENT_AMBULATORY_CARE_PROVIDER_SITE_OTHER): Payer: Medicare HMO | Admitting: Licensed Clinical Social Worker

## 2017-07-10 DIAGNOSIS — F39 Unspecified mood [affective] disorder: Secondary | ICD-10-CM

## 2017-07-16 ENCOUNTER — Ambulatory Visit: Payer: Medicare HMO | Admitting: Psychiatry

## 2017-08-05 DIAGNOSIS — Z8719 Personal history of other diseases of the digestive system: Secondary | ICD-10-CM | POA: Diagnosis not present

## 2017-08-05 DIAGNOSIS — E8881 Metabolic syndrome: Secondary | ICD-10-CM | POA: Diagnosis not present

## 2017-08-05 DIAGNOSIS — F419 Anxiety disorder, unspecified: Secondary | ICD-10-CM | POA: Diagnosis not present

## 2017-08-05 DIAGNOSIS — F329 Major depressive disorder, single episode, unspecified: Secondary | ICD-10-CM | POA: Diagnosis not present

## 2017-08-05 NOTE — Progress Notes (Signed)
   THERAPIST PROGRESS NOTE  Session Time: 106min  Participation Level: Active  Behavioral Response: CasualAlertDepressed  Type of Therapy: Individual Therapy  Treatment Goals addressed: Coping  Interventions: CBT and Reframing  Summary: Mary Koch is a 46 y.o. female who presents with continued symptoms of her diagnosis.  Patient reports that she wants to move out of her home. She reports that she has been arguing with her estranged husband.  She reports that she wants to "get back" with him but he calls her names.  SHe reports that she cooks and cleans for him.  Discussion of problem solving how to correct her current concern  Suicidal/Homicidal: No  Therapist Response: LCSW provided Patient with ongoing emotional support and encouragement.  Normalized her feelings.  Commended Patient on her progress and reinforced the importance of client staying focused on her own strengths and resources and resiliency. Processed various strategies for dealing with stressors.    Plan: Return again in2 weeks.  Diagnosis: Axis I: Mood Disorder    Axis II: No diagnosis    Lubertha South, LCSW 07/11/2017

## 2017-08-11 ENCOUNTER — Other Ambulatory Visit: Payer: Self-pay | Admitting: Psychiatry

## 2017-08-11 ENCOUNTER — Ambulatory Visit (INDEPENDENT_AMBULATORY_CARE_PROVIDER_SITE_OTHER): Payer: Medicare HMO | Admitting: Psychiatry

## 2017-08-11 ENCOUNTER — Encounter: Payer: Self-pay | Admitting: Psychiatry

## 2017-08-11 VITALS — BP 115/80 | HR 96 | Temp 98.8°F | Wt 237.6 lb

## 2017-08-11 DIAGNOSIS — F39 Unspecified mood [affective] disorder: Secondary | ICD-10-CM | POA: Diagnosis not present

## 2017-08-11 MED ORDER — QUETIAPINE FUMARATE 50 MG PO TABS: 50.0000 mg | ORAL_TABLET | Freq: Every day | ORAL | 2 refills | Status: DC

## 2017-08-11 MED ORDER — GABAPENTIN 600 MG PO TABS: ORAL_TABLET | ORAL | 1 refills | Status: DC

## 2017-08-11 MED ORDER — DULOXETINE HCL 30 MG PO CPEP: 30.0000 mg | ORAL_CAPSULE | Freq: Two times a day (BID) | ORAL | 2 refills | Status: DC

## 2017-08-11 MED ORDER — TOPIRAMATE 100 MG PO TABS: 150.0000 mg | ORAL_TABLET | Freq: Every day | ORAL | 2 refills | Status: DC

## 2017-08-11 NOTE — Progress Notes (Signed)
Psychiatric MD Progress Note   Patient Identification: Mary Koch MRN:  [id number] Date of Evaluation:  08/11/2017 Referral Source: [facility] Chief Complaint:   Chief Complaint    Follow-up; Medication Refill     Visit Diagnosis:    ICD-10-CM   1. Episodic mood disorder ([place]) F39   2. Panic disorder with agoraphobia and mild panic attacks F40.01     History of Present Illness:   Patient is a 46 year old female who is currently separated from  her husband presented for follow up.She reported that she is doing well. 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 She reported that her husband does not care as he wants to stay separated. She has been talking to him on a daily basis and wants to be with him but he is not interested. She is also doing therapy on a regular basis. She reported that she has recently started taking the phentermine as prescribed by her primary care physician as she has been gaining weight. She is compliant with her medications. No acute issues noted. She denied having any suicidal homicidal ideations or plans. She appeared calm and alert during the interview.   S She denied having any perceptual disturbances.  Associated Signs/Symptoms: Depression Symptoms:  anxiety, weight gain, increased appetite, (Hypo) Manic Symptoms:  Flight of Ideas, Labiality of Mood, Anxiety Symptoms:  Excessive Worry, Psychotic Symptoms:  none PTSD Symptoms: Negative NA  Past Psychiatric History:  H/o Suicide attempt age 77- OD Tylenol - Not admitted to hospital Following [facility] and was diagnosed with depression and panic disorder.   Previous Psychotropic Medications:  Paxil Luvox Klonopin zoloft seroquel  Substance Abuse History in the last 12 months:  No.  Consequences of Substance Abuse: Negative NA  Past Medical History:  Past Medical History:  Diagnosis Date  . Allergic rhinitis   . Anemia   .  Anxiety   . Anxiety and depression   . Carpal tunnel syndrome   . Depression   . Fibroids   . GERD (gastroesophageal reflux disease)   . Headache(784.0)   . Headache(784.0)   . Heavy periods   . Hyperthyroidism    right portion of thyroid removed  . Muscle spasm   . Neurological disorder    evaluation for ms  . Painful menstrual periods   . Shortness of breath    when i smoke a lot    Past Surgical History:  Procedure Laterality Date  . BREAST BIOPSY Left    neg  . carpel tunnel Left 2017  . CESAREAN SECTION     times 2  . CHOLECYSTECTOMY    . THYROID LOBECTOMY      Family Psychiatric History:  Father- PTSD  Due to agent Orange exposure Brother- Alcoholic  No history of suicide attempts in the family   Family History:  Family History  Problem Relation Age of Onset  . Osteoporosis Mother   . Diabetes Father   . Anxiety disorder Father   . Depression Father   . Post-traumatic stress disorder Father   . Heart disease Father   . Anxiety disorder Sister   . Alcohol abuse Brother   . Breast cancer Neg Hx   . Colon cancer Neg Hx     Social History:   Social History   Social History  . Marital status: Legally Separated    Spouse name: N/A  . Number of children: N/A  . Years of education: N/A  Social History Main Topics  . Smoking status: Current Every Day Smoker    Packs/day: 1.50    Years: 25.00    Types: Cigarettes    Start date: 05/22/1986  . Smokeless tobacco: Never Used  . Alcohol use No  . Drug use: No  . Sexual activity: Yes    Partners: Male    Birth control/ protection: Surgical   Other Topics Concern  . None   Social History Narrative  . None    Additional Social History:  Married in 2002. Seperated in 2013.  Living with  2 boys 28 and 56. 44 year old has been diagnosed with autism He pays child support and she is getting help from her family.  She is currently on disability   Allergies:   Allergies  Allergen Reactions  .  Azithromycin Other (See Comments)    headache  . Codeine Other (See Comments)    unknown  . Penicillins Other (See Comments)    headaches    Metabolic Disorder Labs: No results found for: HGBA1C, MPG No results found for: PROLACTIN No results found for: CHOL, TRIG, HDL, CHOLHDL, VLDL, LDLCALC   Current Medications: Current Outpatient Prescriptions  Medication Sig Dispense Refill  . albuterol (PROVENTIL HFA;VENTOLIN HFA) 108 (90 Base) MCG/ACT inhaler INHALE 1 TO 2 PUFFS DAILY    . albuterol (PROVENTIL HFA;VENTOLIN HFA) 108 (90 Base) MCG/ACT inhaler Inhale 2 puffs into the lungs every 6 (six) hours as needed for wheezing or shortness of breath. 1 Inhaler 2  . cyclobenzaprine (FLEXERIL) 10 MG tablet Take 10 mg by mouth 3 (three) times daily as needed for muscle spasms.    . DULoxetine (CYMBALTA) 30 MG capsule Take 1 capsule (30 mg total) by mouth 2 (two) times daily. 60 capsule 2  . fluticasone (FLONASE) 50 MCG/ACT nasal spray Place 1 spray into both nostrils daily.  4  . Fluticasone Furoate-Vilanterol (BREO ELLIPTA) 100-25 MCG/INH AEPB INHALE 1 PUFF BY MOUTH ONCE DAILY    . gabapentin (NEURONTIN) 600 MG tablet TAKE 1 TABLET BY MOUTH IN THE MORNING AND 1/2 TABLET AT NIGHT  1  . MedroxyPROGESTERone Acetate 150 MG/ML SUSY INJECT 1 ML (150 MG TOTAL) INTO THE MUSCLE EVERY 3 (THREE) MONTHS.  0  . MedroxyPROGESTERone Acetate 150 MG/ML SUSY INJECT 1 ML (150 MG TOTAL) INTO THE MUSCLE EVERY 3 (THREE) MONTHS. 1 Syringe 2  . nystatin-triamcinolone (MYCOLOG II) cream Apply 1 application topically 2 (two) times daily. 30 g 1  . omeprazole (PRILOSEC) 40 MG capsule Take 40 mg by mouth daily.    . phentermine (ADIPEX-P) 37.5 MG tablet Take 37.5 mg by mouth daily.  2  . QUEtiapine (SEROQUEL) 50 MG tablet Take 1 tablet (50 mg total) by mouth at bedtime. 30 tablet 2  . rosuvastatin (CRESTOR) 20 MG tablet Take 20 mg by mouth daily.    Marland Kitchen topiramate (TOPAMAX) 100 MG tablet Take 1.5 tablets (150 mg total) by  mouth daily. 45 tablet 2  . tretinoin (RETIN-A) 0.1 % cream APPLY TO AFFECTED AREA AT BEDTIME  2  . metoCLOPramide (REGLAN) 10 MG tablet Take 1 tablet (10 mg total) by mouth every 8 (eight) hours as needed for nausea. 20 tablet 0   No current facility-administered medications for this visit.     Neurologic: Headache: Yes Seizure: No Paresthesias:No  Musculoskeletal: Strength & Muscle Tone: within normal limits Gait & Station: normal Patient leans: N/A  Psychiatric Specialty Exam: ROS  Blood pressure 115/80, pulse 96, temperature 98.8 F (37.1 C), temperature  source Oral, weight 237 lb 9.6 oz (107.8 kg).Body mass index is 37.21 kg/m.  General Appearance: Casual and Disheveled  Eye Contact:  Fair  Speech:  Clear and Coherent and Normal Rate  Volume:  Normal  Mood:  Dysphoric  Affect:  Congruent  Thought Process:  Goal Directed  Orientation:  Full (Time, Place, and Person)  Thought Content:  WDL  Suicidal Thoughts:  No  Homicidal Thoughts:  No  Memory:  Immediate;   Fair Recent;   Fair Remote;   Fair  Judgement:  Fair  Insight:  Good  Psychomotor Activity:  Normal  Concentration:  Concentration: Fair and Attention Span: Fair  Recall:  [employer] of Knowledge:Fair  Language: Fair  Akathisia:  No  Handed:  Right  AIMS (if indicated):    Assets:  Communication Skills Desire for Improvement Intimacy Physical Health Social Support Transportation  ADL's:  Intact  Cognition: WNL  Sleep:  6    Treatment Plan Summary: Medication management   Discussed with patient at length about the medications treatment risk benefits and alternatives Continue  Seroquel 50 mg by mouth daily at bedtime Continue Topamax 150 mg by mouth daily and she agreed with the plan. Continue Cymbalta 30 mg by mouth twice a day Also takes Neurontin 600 mg in the morning and 300 at bedtime. Continue with her therapy appointments.  Follow-up in 2 months   More than 50% of the time spent in  psychoeducation, counseling and coordination of care.    This note was generated in part or whole with voice recognition software. Voice regonition is usually quite accurate but there are transcription errors that can and very often do occur. I apologize for any typographical errors that were not detected and corrected.    Rainey Pines, MD 8/13/20181:30 PM

## 2017-08-12 ENCOUNTER — Ambulatory Visit: Payer: Medicare HMO | Admitting: Licensed Clinical Social Worker

## 2017-08-13 ENCOUNTER — Other Ambulatory Visit: Payer: Self-pay | Admitting: Psychiatry

## 2017-08-18 ENCOUNTER — Ambulatory Visit: Payer: Medicare HMO | Admitting: Licensed Clinical Social Worker

## 2017-08-18 DIAGNOSIS — F39 Unspecified mood [affective] disorder: Secondary | ICD-10-CM

## 2017-08-18 NOTE — Progress Notes (Signed)
   THERAPIST PROGRESS NOTE  Session Time: 55min  Participation Level: Active  Behavioral Response: CasualAlertDepressed  Type of Therapy: Individual Therapy  Treatment Goals addressed: Coping  Interventions: CBT and Reframing  Summary: Jacy Brocker is a 46 y.o. female who presents with continued symptoms of her diagnosis.  Patient reports that her landlord finally fixed the roof.  Patient reports that her eldest son had surgery and she has been taking care of him.  She reports spending about 3 nights with her ex husband with her son with no concerns by ex husband. Reports taht she wants to move in with ex husband but he does not want her dogs to be left outside. She reports that she has been making several mistakes example while cooking missing the bowl and poring noodles on the floor.  DIscussion on anxiety and boundaries.  Suicidal/Homicidal: No  Therapist Response: LCSW provided Patient with ongoing emotional support and encouragement.  Normalized her feelings.  Commended Patient on her progress and reinforced the importance of client staying focused on her own strengths and resources and resiliency. Processed various strategies for dealing with stressors.    Plan: Return again in2 weeks.  Diagnosis: Axis I: Mood Disorder    Axis II: No diagnosis    Lubertha South, LCSW 08/18/2017

## 2017-09-22 ENCOUNTER — Ambulatory Visit: Payer: Medicare HMO | Admitting: Licensed Clinical Social Worker

## 2017-09-22 ENCOUNTER — Ambulatory Visit (INDEPENDENT_AMBULATORY_CARE_PROVIDER_SITE_OTHER): Payer: Medicare HMO | Admitting: Obstetrics and Gynecology

## 2017-09-22 VITALS — BP 109/67 | HR 88 | Wt 236.8 lb

## 2017-09-22 DIAGNOSIS — Z3042 Encounter for surveillance of injectable contraceptive: Secondary | ICD-10-CM

## 2017-09-22 DIAGNOSIS — N921 Excessive and frequent menstruation with irregular cycle: Secondary | ICD-10-CM | POA: Diagnosis not present

## 2017-09-22 MED ORDER — MEDROXYPROGESTERONE ACETATE 150 MG/ML IM SUSP
150.0000 mg | Freq: Once | INTRAMUSCULAR | Status: AC
  Administered 2017-09-22: 150 mg via INTRAMUSCULAR

## 2017-09-22 NOTE — Progress Notes (Signed)
Patient ID: Mary Koch, female   DOB: January 06, 1971, 46 y.o.   MRN: [id number] Pt presents for her Depo-Provera (Medroxyprogesterone) injection. No c/o side effects. Pt stated that she is having a period for the first time in 6 years. She had spotting last week (not unusual) and started "full blown period" on 09/21/2017.  Pt aware that you will read note and let her know if there is anything to do at this time. Last annual visit: 10/17/2016, next annual scheduled for  10/21/2017. Last Pap: 2015 Last Injection given: 06/23/2017 Depo-Provera 150 mg IM given today: LUOQ, IM by Martie Lee, LPN Next injection due: 12/10 thru 12/22/2017.  I have reviewed the record and concur with patient management and plan. Suzannah Bettes, Hassell Done, MD, FACOG  Addendum: Monitor current bleeding and reassess at her annual exam in October. Brayton Mars, MD

## 2017-10-06 NOTE — Telephone Encounter (Signed)
QUEtiapine (SEROQUEL) 50 MG tablet  Medication  Date: 08/11/2017 Department: [facility] Ordering/Authorizing: Rainey Pines, MD  Order Providers   Prescribing Provider Encounter Provider  Rainey Pines, MD Rainey Pines, MD  Medication Detail    Disp Refills Start End   QUEtiapine (SEROQUEL) 50 MG tablet 30 tablet 2 08/11/2017    Sig - Route: Take 1 tablet (50 mg total) by mouth at bedtime. - Oral   Sent to pharmacy as: QUEtiapine (SEROQUEL) 50 MG tablet   E-Prescribing Status: Receipt confirmed by pharmacy (08/11/2017 1:35 PM EDT)   Pharmacy   [id number] - [place], [place] - [address]

## 2017-10-06 NOTE — Telephone Encounter (Signed)
DULoxetine (CYMBALTA) 30 MG capsule  Medication  Date: 08/11/2017 Department: [facility] Ordering/Authorizing: Rainey Pines, MD  Order Providers   Prescribing Provider Encounter Provider  Rainey Pines, MD Rainey Pines, MD  Medication Detail    Disp Refills Start End   DULoxetine (CYMBALTA) 30 MG capsule 60 capsule 2 08/11/2017    Sig - Route: Take 1 capsule (30 mg total) by mouth 2 (two) times daily. - Oral   Sent to pharmacy as: DULoxetine (CYMBALTA) 30 MG capsule   E-Prescribing Status: Receipt confirmed by pharmacy (08/11/2017 1:36 PM EDT)   Pharmacy   [id number] - [place], [place] - [address]

## 2017-10-13 ENCOUNTER — Ambulatory Visit: Payer: Medicare HMO | Admitting: Psychiatry

## 2017-10-21 ENCOUNTER — Ambulatory Visit (INDEPENDENT_AMBULATORY_CARE_PROVIDER_SITE_OTHER): Payer: Medicare HMO | Admitting: Psychiatry

## 2017-10-21 ENCOUNTER — Encounter: Payer: Self-pay | Admitting: Obstetrics and Gynecology

## 2017-10-21 ENCOUNTER — Encounter: Payer: Self-pay | Admitting: Psychiatry

## 2017-10-21 VITALS — BP 135/79 | HR 108 | Temp 98.4°F | Wt 241.4 lb

## 2017-10-21 DIAGNOSIS — F4000 Agoraphobia, unspecified: Secondary | ICD-10-CM

## 2017-10-21 DIAGNOSIS — F1520 Other stimulant dependence, uncomplicated: Secondary | ICD-10-CM

## 2017-10-21 DIAGNOSIS — F39 Unspecified mood [affective] disorder: Secondary | ICD-10-CM

## 2017-10-21 DIAGNOSIS — F5105 Insomnia due to other mental disorder: Secondary | ICD-10-CM | POA: Diagnosis not present

## 2017-10-21 DIAGNOSIS — F99 Mental disorder, not otherwise specified: Secondary | ICD-10-CM | POA: Diagnosis not present

## 2017-10-21 DIAGNOSIS — F410 Panic disorder [episodic paroxysmal anxiety] without agoraphobia: Secondary | ICD-10-CM | POA: Diagnosis not present

## 2017-10-21 MED ORDER — DULOXETINE HCL 30 MG PO CPEP: 30.0000 mg | ORAL_CAPSULE | Freq: Two times a day (BID) | ORAL | 2 refills | Status: DC

## 2017-10-21 MED ORDER — QUETIAPINE FUMARATE 50 MG PO TABS
75.0000 mg | ORAL_TABLET | Freq: Every day | ORAL | 2 refills | Status: DC
Start: 2017-10-21 — End: 2017-12-16

## 2017-10-21 MED ORDER — HYDROXYZINE PAMOATE 25 MG PO CAPS: 25.0000 mg | ORAL_CAPSULE | Freq: Three times a day (TID) | ORAL | 2 refills | Status: DC | PRN

## 2017-10-21 MED ORDER — TOPIRAMATE 100 MG PO TABS: 150.0000 mg | ORAL_TABLET | Freq: Every day | ORAL | 2 refills | Status: DC

## 2017-10-21 NOTE — Progress Notes (Signed)
BH MD/PA/NP OP Progress Note  10/21/2017 5:28 PM Mary Koch  MRN:  [id number]  Chief Complaint: " I still have some anxiety and mood sx."   Chief Complaint    Follow-up; Medication Refill     HPI: Mary Koch is a 46 year old Caucasian female, lives in [place], has a history of depression and anxiety symptoms, is on Social Security disability, presents to the clinic for a follow-up appointment today.  Leslyn used to follow-up with Dr.Faheem in the past.  Dreanna reports that she continues to struggle with on and off mood symptoms.  She reports that her current medications ,Topamax as well as the Cymbalta has been helpful.  But she has her own moments, when she can be angry /irritable .  She however has been trying to cope.  She continues to struggle with relational issues with her husband, with whom she has been separated since the past 5 yrs or so .  She has an autistic child that she takes care of , he is 35 yrs old. He continues to need support ,speech therapy and so on .  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   She continues to have anxiety attacks/panic attacks.  She continues to avoid going into crowded places like [employer].  She reports that she has to go to [employer] at 2 AM in the morning to do her shopping.  She continues to be socially withdrawn, is a full-time mom.  She reports she does not have any friends, has not started dating again.  She continues to have some sleep issues , reports 4 hours of sleep at night.  She takes Seroquel which helps with sleep as well as her mood symptoms.  She reports that Seroquel does not cause her appetite to be increased and has not caused any weight gain.  But she does have a history of struggling with weight issues in the past and she reports she continues to struggle with that.  However she reports she wants to stay on the Seroquel.  She reports that she has been drinking up to 12 cans of [facility] per day.  She has tried to cut down but has not been successful she  was able to stay away from that for up to 2 years in the past but she restarted it again.  Discussed with patient that some of her anxiety issues as well as her sleep problems and weight gain issues could be contributed by her drinking a lot of sugary/caffeinated drinks like [facility].  Discussed that she need to slowly cut down and stop using the [facility] discussed strategies to replace [facility] with either plain water or flavored water but discussed other coping mechanisms like relaxation techniques taking a walk going out with friends reaching out to family.  Patient continues to follow up with Ms. Royal Piedra for psychotherapy she continues to do want to do that.   Visit Diagnosis:    ICD-10-CM   1. Panic disorder F41.0 DULoxetine (CYMBALTA) 30 MG capsule    QUEtiapine (SEROQUEL) 50 MG tablet    topiramate (TOPAMAX) 100 MG tablet    hydrOXYzine (VISTARIL) 25 MG capsule  2. Agoraphobia F40.00   3. 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 Episodic mood disorder ([facility]) F39   5. Insomnia due to other mental disorder F51.05    F99     Past Psychiatric History: History of depression and anxiety.  History of suicide attempt at the age of 42 when she overdosed on Tylenol.  She was not admitted to the hospital at that time.  She used to follow up with Dr.Faheem and also with [facility] in the past. Past trials of Paxil, Luvox, Klonopin, Zoloft, Seroquel.  Past Medical History:  Past Medical History:  Diagnosis Date  . Allergic rhinitis   . Anemia   . Anxiety   . Anxiety and depression   . Carpal tunnel syndrome   . Depression   . Fibroids   . GERD (gastroesophageal reflux disease)   . Headache(784.0)   . Headache(784.0)   . Heavy periods   . Hyperthyroidism    right portion of thyroid removed  . Muscle spasm   . Neurological disorder    evaluation for ms  . Painful menstrual periods   . Shortness of breath    when i smoke a lot    Past Surgical  History:  Procedure Laterality Date  . BREAST BIOPSY Left    neg  . carpel tunnel Left 2017  . CESAREAN SECTION     times 2  . CHOLECYSTECTOMY    . THYROID LOBECTOMY      Family Psychiatric History: Father-PTSD due to all agent orange exposure.  Brother-alcoholic.  No history of suicide attempt in the family.  Family History:  Family History  Problem Relation Age of Onset  . Osteoporosis Mother   . Diabetes Father   . Anxiety disorder Father   . Depression Father   . Post-traumatic stress disorder Father   . Heart disease Father   . Anxiety disorder Sister   . Alcohol abuse Brother   . Breast cancer Neg Hx   . Colon cancer Neg Hx     Social History: She is currently separated from her husband since 2013.  She has 2 boys aged 56 and 44 years old.  Her 32-year-old has been diagnosed with autism.  She is currently on Social Security disability as well as she gets SSI for her child and also gets child support from her husband. Social History   Social History  . Marital status: Legally Separated    Spouse name: N/A  . Number of children: N/A  . Years of education: N/A   Social History Main Topics  . Smoking status: Current Every Day Smoker    Packs/day: 1.50    Years: 25.00    Types: Cigarettes    Start date: 05/22/1986  . Smokeless tobacco: Never Used  . Alcohol use No  . Drug use: No  . Sexual activity: Yes    Partners: Male    Birth control/ protection: Surgical   Other Topics Concern  . None   Social History Narrative  . None    Allergies:  Allergies  Allergen Reactions  . Azithromycin Other (See Comments)    headache  . Codeine Other (See Comments)    unknown  . Penicillins Other (See Comments)    headaches    Metabolic Disorder Labs: No results found for: HGBA1C, MPG No results found for: PROLACTIN No results found for: CHOL, TRIG, HDL, CHOLHDL, VLDL, LDLCALC No results found for: TSH  Therapeutic Level Labs: No results found for: LITHIUM No  results found for: VALPROATE No components found for:  CBMZ  Current Medications: Current Outpatient Prescriptions  Medication Sig Dispense Refill  . albuterol (PROVENTIL HFA;VENTOLIN HFA) 108 (90 Base) MCG/ACT inhaler INHALE 1 TO 2 PUFFS DAILY    . albuterol (PROVENTIL HFA;VENTOLIN HFA) 108 (90 Base) MCG/ACT inhaler Inhale 2 puffs into the lungs every 6 (  six) hours as needed for wheezing or shortness of breath. 1 Inhaler 2  . cyclobenzaprine (FLEXERIL) 10 MG tablet Take 10 mg by mouth 3 (three) times daily as needed for muscle spasms.    . DULoxetine (CYMBALTA) 30 MG capsule Take 1 capsule (30 mg total) by mouth 2 (two) times daily. 60 capsule 2  . fluticasone (FLONASE) 50 MCG/ACT nasal spray Place 1 spray into both nostrils daily.  4  . Fluticasone Furoate-Vilanterol (BREO ELLIPTA) 100-25 MCG/INH AEPB INHALE 1 PUFF BY MOUTH ONCE DAILY    . furosemide (LASIX) 20 MG tablet TAKE 1 TABLET BY MOUTH TWICE A WEEK  5  . gabapentin (NEURONTIN) 600 MG tablet TAKE 1 TABLET BY MOUTH IN THE MORNING AND 1/2 TABLET AT NIGHT 45 tablet 1  . MedroxyPROGESTERone Acetate 150 MG/ML SUSY INJECT 1 ML (150 MG TOTAL) INTO THE MUSCLE EVERY 3 (THREE) MONTHS.  0  . MedroxyPROGESTERone Acetate 150 MG/ML SUSY INJECT 1 ML (150 MG TOTAL) INTO THE MUSCLE EVERY 3 (THREE) MONTHS. 1 Syringe 2  . nystatin-triamcinolone (MYCOLOG II) cream Apply 1 application topically 2 (two) times daily. 30 g 1  . omeprazole (PRILOSEC) 40 MG capsule Take 40 mg by mouth daily.    . phentermine (ADIPEX-P) 37.5 MG tablet Take 37.5 mg by mouth daily.  2  . rosuvastatin (CRESTOR) 20 MG tablet Take 20 mg by mouth daily.    Marland Kitchen topiramate (TOPAMAX) 100 MG tablet Take 1.5 tablets (150 mg total) by mouth daily. 45 tablet 2  . tretinoin (RETIN-A) 0.1 % cream APPLY TO AFFECTED AREA AT BEDTIME  2  . hydrOXYzine (VISTARIL) 25 MG capsule Take 1 capsule (25 mg total) by mouth 3 (three) times daily as needed for anxiety. 90 capsule 2  . metoCLOPramide (REGLAN)  10 MG tablet Take 1 tablet (10 mg total) by mouth every 8 (eight) hours as needed for nausea. 20 tablet 0  . QUEtiapine (SEROQUEL) 50 MG tablet Take 1.5 tablets (75 mg total) by mouth at bedtime. 45 tablet 2   No current facility-administered medications for this visit.      Musculoskeletal: Strength & Muscle Tone: within normal limits Gait & Station: normal Patient leans: N/A  Psychiatric Specialty Exam: Review of Systems  Psychiatric/Behavioral: Positive for depression. The patient is nervous/anxious and has insomnia.   All other systems reviewed and are negative.   Blood pressure 135/79, pulse (!) 108, temperature 98.4 F (36.9 C), temperature source Oral, weight 241 lb 6.4 oz (109.5 kg), last menstrual period 09/21/2017.Body mass index is 37.81 kg/m.  General Appearance: Casual  Eye Contact:  Fair  Speech:  Normal Rate  Volume:  Normal  Mood:  Anxious and Dysphoric  Affect:  Congruent  Thought Process:  Goal Directed and Descriptions of Associations: Circumstantial  Orientation:  Full (Time, Place, and Person)  Thought Content: Rumination   Suicidal Thoughts:  No  Homicidal Thoughts:  No  Memory:  Immediate;   Fair Recent;   Fair Remote;   Fair  Judgement:  Fair  Insight:  Fair  Psychomotor Activity:  Normal  Concentration:  Concentration: Fair and Attention Span: Fair  Recall:  [employer] of Knowledge: Fair  Language: Fair  Akathisia:  No  Handed:  Right  AIMS (if indicated): Denies tremors, rigidity  Assets:  Communication Skills Desire for Improvement Physical Health  ADL's:  Intact  Cognition: WNL  Sleep:  Fair   Screenings: AUDIT     Office Visit from 05/22/2016 in [facility]  Alcohol Use Disorder Identification Test Final Score (AUDIT)  0    PHQ2-9     Office Visit from 05/22/2016 in [facility]  PHQ-2 Total Score  4  PHQ-9 Total Score  18       Assessment and Plan: Yasmine is a  46 year old Caucasian female with history of anxiety depression mood lability, she continues to be compliant on her medications.  She continues to struggle with some mood symptoms as well as panic attacks and also sleep problems.  Discussed coping strategies.  Discussed medication management.  Discussed to continue to follow up with Elmyra Ricks for CBT. Plan as noted below  Plan For anxiety/panic do. Continue Cymbalta 30 mg p.o. twice daily. Continue Neurontin 600 mg in the morning and 300 mg at bedtime (prescribed by her neurologist  Start Vistaril 25 mg p.o. 3 times daily as needed.   Continue CBT more frequently with Ms.[name] in clinic.  For depressive/ mood symptoms Increase Seroquel to 75 mg by mouth at bedtime Topamax 150 mg by mouth daily Cymbalta 30 mg by mouth twice a day.  For insomnia: Seroquel 75 mg po qhs. Discussed sleep hygiene. Discusses cutting down caffeine intake.  For Agoraphobia: CBT - with Ms.[name].  Caffeine addiction: Discussed cutting down caffeine to address her mood symptoms as well as sleep issues.  Discussed medication side effects.  Provided handouts.  Follow-up in 6-8 weeks.  More than 50 % of the time was spent for psychoeducation and supportive psychotherapy and care coordination.    Ursula Alert, MD 10/21/2017, 5:28 PM

## 2017-10-21 NOTE — Patient Instructions (Signed)
Hydroxyzine capsules or tablets What is this medicine? HYDROXYZINE (hye [place] i zeen) is an antihistamine. This medicine is used to treat allergy symptoms. It is also used to treat anxiety and tension. This medicine can be used with other medicines to induce sleep before surgery. This medicine may be used for other purposes; ask your health care provider or pharmacist if you have questions. COMMON BRAND NAME(S): ANX, Atarax, Rezine, Vistaril What should I tell my health care provider before I take this medicine? They need to know if you have any of these conditions: -any chronic illness -difficulty passing urine -glaucoma -heart disease -kidney disease -liver disease -lung disease -an unusual or allergic reaction to hydroxyzine, cetirizine, other medicines, foods, dyes, or preservatives -pregnant or trying to get pregnant -breast-feeding How should I use this medicine? Take this medicine by mouth with a full glass of water. Follow the directions on the prescription label. You may take this medicine with food or on an empty stomach. Take your medicine at regular intervals. Do not take your medicine more often than directed. Talk to your pediatrician regarding the use of this medicine in children. Special care may be needed. While this drug may be prescribed for children as young as 75 years of age for selected conditions, precautions do apply. Patients over 62 years old may have a stronger reaction and need a smaller dose. Overdosage: If you think you have taken too much of this medicine contact a poison control center or emergency room at once. NOTE: This medicine is only for you. Do not share this medicine with others. What if I miss a dose? If you miss a dose, take it as soon as you can. If it is almost time for your next dose, take only that dose. Do not take double or extra doses. What may interact with this medicine? -alcohol -barbiturate medicines for sleep or seizures -medicines for  colds, allergies -medicines for depression, anxiety, or emotional disturbances -medicines for pain -medicines for sleep -muscle relaxants This list may not describe all possible interactions. Give your health care provider a list of all the medicines, herbs, non-prescription drugs, or dietary supplements you use. Also tell them if you smoke, drink alcohol, or use illegal drugs. Some items may interact with your medicine. What should I watch for while using this medicine? Tell your doctor or health care professional if your symptoms do not improve. You may get drowsy or dizzy. Do not drive, use machinery, or do anything that needs mental alertness until you know how this medicine affects you. Do not stand or sit up quickly, especially if you are an older patient. This reduces the risk of dizzy or fainting spells. Alcohol may interfere with the effect of this medicine. Avoid alcoholic drinks. Your mouth may get dry. Chewing sugarless gum or sucking hard candy, and drinking plenty of water may help. Contact your doctor if the problem does not go away or is severe. This medicine may cause dry eyes and blurred vision. If you wear contact lenses you may feel some discomfort. Lubricating drops may help. See your eye doctor if the problem does not go away or is severe. If you are receiving skin tests for allergies, tell your doctor you are using this medicine. What side effects may I notice from receiving this medicine? Side effects that you should report to your doctor or health care professional as soon as possible: -fast or irregular heartbeat -difficulty passing urine -seizures -slurred speech or confusion -tremor Side effects that  usually do not require medical attention (report to your doctor or health care professional if they continue or are bothersome): -constipation -drowsiness -fatigue -headache -stomach upset This list may not describe all possible side effects. Call your doctor for  medical advice about side effects. You may report side effects to FDA at 1-800-FDA-1088. Where should I keep my medicine? Keep out of the reach of children. Store at room temperature between 15 and 30 degrees C (59 and 86 degrees F). Keep container tightly closed. Throw away any unused medicine after the expiration date. NOTE: This sheet is a summary. It may not cover all possible information. If you have questions about this medicine, talk to your doctor, pharmacist, or health care provider.  2018 Elsevier/Gold Standard (2008-04-29 14:50:59)  

## 2017-10-22 ENCOUNTER — Ambulatory Visit: Payer: Medicare HMO | Admitting: Licensed Clinical Social Worker

## 2017-10-23 NOTE — Progress Notes (Signed)
ANNUAL PREVENTATIVE CARE GYN  ENCOUNTER NOTE  Subjective:       Mary Koch is a 46 y.o. No obstetric history on file. female here for a routine annual gynecologic exam.  Current complaints: 1.   Spotting 3 weeks before depo inj  Still smoking 1 pack of cigarettes a day. Efforts to quit have been unsuccessful. Bowel and bladder function are normal. She denies major interval health issues. Father is struggling with COPD and lung cancer at this time.   Gynecologic History No LMP recorded. Patient has had an injection. Contraception: Depo-Provera injections/TL Last Pap: 04/2014 neg/neg. Results were: normal Last mammogram: 10/2016 birad 1. Results were: normal  Obstetric History OB History  Gravida Para Term Preterm AB Living  2 2 2     2   SAB TAB Ectopic Multiple Live Births          2    # Outcome Date GA Lbr Len/2nd Weight Sex Delivery Anes PTL Lv  2 Term 2010   7 lb 1.8 oz (3.225 kg) M CS-LTranv   LIV  1 Term 2003   7 lb 6.4 oz (3.357 kg) M CS-LTranv   LIV      Past Medical History:  Diagnosis Date  . Allergic rhinitis   . Anemia   . Anxiety   . Anxiety and depression   . Carpal tunnel syndrome   . Depression   . Fibroids   . GERD (gastroesophageal reflux disease)   . Headache(784.0)   . Headache(784.0)   . Heavy periods   . Hyperthyroidism    right portion of thyroid removed  . Muscle spasm   . Neurological disorder    evaluation for ms  . Painful menstrual periods   . Shortness of breath    when i smoke a lot    Past Surgical History:  Procedure Laterality Date  . BREAST BIOPSY Left    neg  . carpel tunnel Left 2017  . CESAREAN SECTION     times 2  . CHOLECYSTECTOMY    . THYROID LOBECTOMY      Current Outpatient Prescriptions on File Prior to Visit  Medication Sig Dispense Refill  . albuterol (PROVENTIL HFA;VENTOLIN HFA) 108 (90 Base) MCG/ACT inhaler INHALE 1 TO 2 PUFFS DAILY    . albuterol (PROVENTIL HFA;VENTOLIN HFA) 108 (90 Base) MCG/ACT  inhaler Inhale 2 puffs into the lungs every 6 (six) hours as needed for wheezing or shortness of breath. 1 Inhaler 2  . cyclobenzaprine (FLEXERIL) 10 MG tablet Take 10 mg by mouth 3 (three) times daily as needed for muscle spasms.    . DULoxetine (CYMBALTA) 30 MG capsule Take 1 capsule (30 mg total) by mouth 2 (two) times daily. 60 capsule 2  . fluticasone (FLONASE) 50 MCG/ACT nasal spray Place 1 spray into both nostrils daily.  4  . Fluticasone Furoate-Vilanterol (BREO ELLIPTA) 100-25 MCG/INH AEPB INHALE 1 PUFF BY MOUTH ONCE DAILY    . furosemide (LASIX) 20 MG tablet TAKE 1 TABLET BY MOUTH TWICE A WEEK  5  . gabapentin (NEURONTIN) 600 MG tablet TAKE 1 TABLET BY MOUTH IN THE MORNING AND 1/2 TABLET AT NIGHT 45 tablet 1  . hydrOXYzine (VISTARIL) 25 MG capsule Take 1 capsule (25 mg total) by mouth 3 (three) times daily as needed for anxiety. 90 capsule 2  . MedroxyPROGESTERone Acetate 150 MG/ML SUSY INJECT 1 ML (150 MG TOTAL) INTO THE MUSCLE EVERY 3 (THREE) MONTHS.  0  . MedroxyPROGESTERone Acetate 150 MG/ML SUSY  INJECT 1 ML (150 MG TOTAL) INTO THE MUSCLE EVERY 3 (THREE) MONTHS. 1 Syringe 2  . metoCLOPramide (REGLAN) 10 MG tablet Take 1 tablet (10 mg total) by mouth every 8 (eight) hours as needed for nausea. 20 tablet 0  . nystatin-triamcinolone (MYCOLOG II) cream Apply 1 application topically 2 (two) times daily. 30 g 1  . omeprazole (PRILOSEC) 40 MG capsule Take 40 mg by mouth daily.    . phentermine (ADIPEX-P) 37.5 MG tablet Take 37.5 mg by mouth daily.  2  . QUEtiapine (SEROQUEL) 50 MG tablet Take 1.5 tablets (75 mg total) by mouth at bedtime. 45 tablet 2  . rosuvastatin (CRESTOR) 20 MG tablet Take 20 mg by mouth daily.    Marland Kitchen topiramate (TOPAMAX) 100 MG tablet Take 1.5 tablets (150 mg total) by mouth daily. 45 tablet 2  . tretinoin (RETIN-A) 0.1 % cream APPLY TO AFFECTED AREA AT BEDTIME  2   No current facility-administered medications on file prior to visit.     Allergies  Allergen Reactions   . Azithromycin Other (See Comments)    headache  . Codeine Other (See Comments)    unknown  . Penicillins Other (See Comments)    headaches    Social History   Social History  . Marital status: Legally Separated    Spouse name: N/A  . Number of children: N/A  . Years of education: N/A   Occupational History  . Not on file.   Social History Main Topics  . Smoking status: Current Every Day Smoker    Packs/day: 1.50    Years: 25.00    Types: Cigarettes    Start date: 05/22/1986  . Smokeless tobacco: Never Used  . Alcohol use No  . Drug use: No  . Sexual activity: Yes    Partners: Male    Birth control/ protection: Surgical   Other Topics Concern  . Not on file   Social History Narrative  . No narrative on file    Family History  Problem Relation Age of Onset  . Osteoporosis Mother   . Diabetes Father   . Anxiety disorder Father   . Depression Father   . Post-traumatic stress disorder Father   . Heart disease Father   . Anxiety disorder Sister   . Alcohol abuse Brother   . Breast cancer Neg Hx   . Colon cancer Neg Hx     The following portions of the patient's history were reviewed and updated as appropriate: allergies, current medications, past family history, past medical history, past social history, past surgical history and problem list.  Review of Systems Review of Systems  Constitutional: Negative.   HENT: Negative.   Eyes: Negative.   Respiratory: Negative.   Cardiovascular: Negative.   Gastrointestinal: Negative.   Genitourinary:       Spotting with Depo-Provera approximately several weeks prior to next injection  Musculoskeletal: Negative.   Skin: Negative.   Neurological: Negative.   Endo/Heme/Allergies: Negative.   Psychiatric/Behavioral: Negative.       Objective:   BP 137/79   Pulse (!) 112   Ht 5\' 7"  (1.702 m)   Wt 239 lb 6.4 oz (108.6 kg)   LMP  (LMP Unknown)   BMI 37.50 kg/m  CONSTITUTIONAL: Well-developed, well-nourished  female in no acute distress.  PSYCHIATRIC: Normal mood and affect. Normal behavior. Normal judgment and thought content. Plum: Alert and oriented to person, place, and time. Normal muscle tone coordination. No cranial nerve deficit noted. HENT:  Normocephalic, atraumatic,  External right and left ear normal. Oropharynx is clear and moist EYES: Conjunctivae and EOM are normal.  No scleral icterus.  NECK: Normal range of motion, supple, no masses.  Normal thyroid.  SKIN: Skin is warm and dry. No rash noted. Not diaphoretic. No erythema. No pallor. CARDIOVASCULAR: Normal heart rate noted, regular rhythm, no murmur. RESPIRATORY: Clear to auscultation bilaterally. Effort and breath sounds normal, no problems with respiration noted. BREASTS: Symmetric in size. No masses, skin changes, nipple drainage, or lymphadenopathy. ABDOMEN: Soft, normal bowel sounds, no distention noted.  No tenderness, rebound or guarding. C-section incisions well-healed; no evidence of rash or inflammation today BLADDER: Normal PELVIC:  External Genitalia: Normal  BUS: Normal  Vagina: Normal; good vault support; proximal narrowing near cervix  Cervix: Normal; no cervical motion tenderness; minimal blood clot at loss; no lesions  Uterus: Normal; midplane, situated high within the vault; mobile; nontender; normal size and shape  Adnexa: Normal; nonpalpable and nontender  RV: External Exam NormaI, No Rectal Masses and Normal Sphincter tone  MUSCULOSKELETAL: Normal range of motion. No tenderness.  No cyanosis, clubbing, or edema.  2+ distal pulses. LYMPHATIC: No Axillary, Supraclavicular, or Inguinal Adenopathy.    Assessment:   Annual gynecologic examination 46 y.o. Contraception: Depo-Provera injections/TL bmi- 37 Problem List Items Addressed This Visit    Excess, menstruation   Increased BMI    Other Visit Diagnoses    Well woman exam with routine gynecological exam    -  Primary   Encounter for surveillance  of injectable contraceptive       Tobacco user       Encounter for screening mammogram for breast cancer          Plan:  Pap: Pap w/hpv Mammogram: Ordered Stool Guaiac Testing:  Not Indicated Labs: thru pcp Routine preventative health maintenance measures emphasized: Exercise/Diet/Weight control, Tobacco Warnings and Alcohol/Substance use risks Continue Depo-Provera every 3 months Contraception-tubal ligation Strongly encourage smoking cessation Return to [place], CMA  Brayton Mars, MD   Note: This dictation was prepared with Dragon dictation along with smaller phrase technology. Any transcriptional errors that result from this process are unintentional.

## 2017-10-27 DIAGNOSIS — G4719 Other hypersomnia: Secondary | ICD-10-CM | POA: Diagnosis not present

## 2017-10-27 DIAGNOSIS — G5603 Carpal tunnel syndrome, bilateral upper limbs: Secondary | ICD-10-CM | POA: Diagnosis not present

## 2017-10-27 DIAGNOSIS — F5104 Psychophysiologic insomnia: Secondary | ICD-10-CM | POA: Diagnosis not present

## 2017-10-27 DIAGNOSIS — G43119 Migraine with aura, intractable, without status migrainosus: Secondary | ICD-10-CM | POA: Diagnosis not present

## 2017-10-28 ENCOUNTER — Ambulatory Visit (INDEPENDENT_AMBULATORY_CARE_PROVIDER_SITE_OTHER): Payer: Medicare HMO | Admitting: Licensed Clinical Social Worker

## 2017-10-28 DIAGNOSIS — F39 Unspecified mood [affective] disorder: Secondary | ICD-10-CM | POA: Diagnosis not present

## 2017-10-28 DIAGNOSIS — F410 Panic disorder [episodic paroxysmal anxiety] without agoraphobia: Secondary | ICD-10-CM | POA: Diagnosis not present

## 2017-10-30 ENCOUNTER — Encounter: Payer: Self-pay | Admitting: Obstetrics and Gynecology

## 2017-10-30 ENCOUNTER — Ambulatory Visit (INDEPENDENT_AMBULATORY_CARE_PROVIDER_SITE_OTHER): Payer: Medicare HMO | Admitting: Obstetrics and Gynecology

## 2017-10-30 VITALS — BP 137/79 | HR 112 | Ht 67.0 in | Wt 239.4 lb

## 2017-10-30 DIAGNOSIS — Z3042 Encounter for surveillance of injectable contraceptive: Secondary | ICD-10-CM | POA: Diagnosis not present

## 2017-10-30 DIAGNOSIS — R638 Other symptoms and signs concerning food and fluid intake: Secondary | ICD-10-CM | POA: Diagnosis not present

## 2017-10-30 DIAGNOSIS — Z01419 Encounter for gynecological examination (general) (routine) without abnormal findings: Secondary | ICD-10-CM | POA: Diagnosis not present

## 2017-10-30 DIAGNOSIS — Z720 Tobacco use: Secondary | ICD-10-CM | POA: Diagnosis not present

## 2017-10-30 DIAGNOSIS — Z1231 Encounter for screening mammogram for malignant neoplasm of breast: Secondary | ICD-10-CM

## 2017-10-30 DIAGNOSIS — N921 Excessive and frequent menstruation with irregular cycle: Secondary | ICD-10-CM

## 2017-10-30 MED ORDER — NYSTATIN-TRIAMCINOLONE 100000-0.1 UNIT/GM-% EX CREA: 1.0000 "application " | TOPICAL_CREAM | Freq: Two times a day (BID) | CUTANEOUS | 1 refills | Status: DC

## 2017-10-30 MED ORDER — MEDROXYPROGESTERONE ACETATE 150 MG/ML IM SUSY: 150.0000 mg | PREFILLED_SYRINGE | INTRAMUSCULAR | 2 refills | Status: DC

## 2017-10-30 NOTE — Progress Notes (Signed)
   THERAPIST PROGRESS NOTE  Session Time: 28min  Participation Level: Active  Behavioral Response: CasualAlertEuthymic  Type of Therapy: Individual Therapy  Treatment Goals addressed: Coping  Interventions: CBT, Motivational Interviewing, Supportive and Reframing  Summary: Mary Koch is a 46 y.o. female who presents with continued symptoms of her diagnosis.  Patient reports that nothing has changed in her life.  She reports that her landlord continues to ignore her request to fix things in the home.  She reports that her mood has not changed.  She denies using coping skills to assist with reduction in mood swings.  She reports that she worries about providing for her children.  She reports that she continues to homeschool them but struggles with teaching her youngest son who is 8 how to read.  She reports that her oldest son does well in [employer].  She reports that her estranged husband triggers her anxiety. Factors that contribute to client's ongoing depressive symptoms were discussed and include real and perceived feelings of isolation, criticism, rejection, shame and guilt.   Suicidal/Homicidal: No  Therapist Response: Therapist active listened and used choice theory to assist Patient with making changes in her thought process.  Therapist provided examples of coping skills to use and assisted her with budgeting.  Therapist encouraged patient to write a letter to landlord to document concerns.    Plan: Return again in2 weeks.  Diagnosis: Axis I: Mood Disorder    Axis II: No diagnosis    Lubertha South, LCSW 10/28/2017

## 2017-10-30 NOTE — Patient Instructions (Addendum)
1.  Pap smear is done today 2.  Mammogram is ordered 3.  Screening labs are to be obtained through primary care 4.  Continue with healthy eating, exercise, and control weight loss 5.  Smoking cessation is strongly encouraged. 6.  Return in 1 year 7.  Return in December for next Depo-Provera injection  Health Maintenance, Female Adopting a healthy lifestyle and getting preventive care can go a long way to promote health and wellness. Talk with your health care provider about what schedule of regular examinations is right for you. This is a good chance for you to check in with your provider about disease prevention and staying healthy. In between checkups, there are plenty of things you can do on your own. Experts have done a lot of research about which lifestyle changes and preventive measures are most likely to keep you healthy. Ask your health care provider for more information. Weight and diet Eat a healthy diet  Be sure to include plenty of vegetables, fruits, low-fat dairy products, and lean protein.  Do not eat a lot of foods high in solid fats, added sugars, or salt.  Get regular exercise. This is one of the most important things you can do for your health. ? Most adults should exercise for at least 150 minutes each week. The exercise should increase your heart rate and make you sweat (moderate-intensity exercise). ? Most adults should also do strengthening exercises at least twice a week. This is in addition to the moderate-intensity exercise.  Maintain a healthy weight  Body mass index (BMI) is a measurement that can be used to identify possible weight problems. It estimates body fat based on height and weight. Your health care provider can help determine your BMI and help you achieve or maintain a healthy weight.  For females 81 years of age and older: ? A BMI below 18.5 is considered underweight. ? A BMI of 18.5 to 24.9 is normal. ? A BMI of 25 to 29.9 is considered  overweight. ? A BMI of 30 and above is considered obese.  Watch levels of cholesterol and blood lipids  You should start having your blood tested for lipids and cholesterol at 44 years of age, then have this test every 5 years.  You may need to have your cholesterol levels checked more often if: ? Your lipid or cholesterol levels are high. ? You are older than 44 years of age. ? You are at high risk for heart disease.  Cancer screening Lung Cancer  Lung cancer screening is recommended for adults 80-18 years old who are at high risk for lung cancer because of a history of smoking.  A yearly low-dose CT scan of the lungs is recommended for people who: ? Currently smoke. ? Have quit within the past 15 years. ? Have at least a 30-pack-year history of smoking. A pack year is smoking an average of one pack of cigarettes a day for 1 year.  Yearly screening should continue until it has been 15 years since you quit.  Yearly screening should stop if you develop a health problem that would prevent you from having lung cancer treatment.  Breast Cancer  Practice breast self-awareness. This means understanding how your breasts normally appear and feel.  It also means doing regular breast self-exams. Let your health care provider know about any changes, no matter how small.  If you are in your 20s or 30s, you should have a clinical breast exam (CBE) by a health care provider  every 1-3 years as part of a regular health exam.  If you are 41 or older, have a CBE every year. Also consider having a breast X-ray (mammogram) every year.  If you have a family history of breast cancer, talk to your health care provider about genetic screening.  If you are at high risk for breast cancer, talk to your health care provider about having an MRI and a mammogram every year.  Breast cancer gene (BRCA) assessment is recommended for women who have family members with BRCA-related cancers. BRCA-related cancers  include: ? Breast. ? Ovarian. ? Tubal. ? Peritoneal cancers.  Results of the assessment will determine the need for genetic counseling and BRCA1 and BRCA2 testing.  Cervical Cancer Your health care provider may recommend that you be screened regularly for cancer of the pelvic organs (ovaries, uterus, and vagina). This screening involves a pelvic examination, including checking for microscopic changes to the surface of your cervix (Pap test). You may be encouraged to have this screening done every 3 years, beginning at age 65.  For women ages 44-65, health care providers may recommend pelvic exams and Pap testing every 3 years, or they may recommend the Pap and pelvic exam, combined with testing for human papilloma virus (HPV), every 5 years. Some types of HPV increase your risk of cervical cancer. Testing for HPV may also be done on women of any age with unclear Pap test results.  Other health care providers may not recommend any screening for nonpregnant women who are considered low risk for pelvic cancer and who do not have symptoms. Ask your health care provider if a screening pelvic exam is right for you.  If you have had past treatment for cervical cancer or a condition that could lead to cancer, you need Pap tests and screening for cancer for at least 20 years after your treatment. If Pap tests have been discontinued, your risk factors (such as having a new sexual partner) need to be reassessed to determine if screening should resume. Some women have medical problems that increase the chance of getting cervical cancer. In these cases, your health care provider may recommend more frequent screening and Pap tests.  Colorectal Cancer  This type of cancer can be detected and often prevented.  Routine colorectal cancer screening usually begins at 44 years of age and continues through 44 years of age.  Your health care provider may recommend screening at an earlier age if you have risk factors  for colon cancer.  Your health care provider may also recommend using home test kits to check for hidden blood in the stool.  A small camera at the end of a tube can be used to examine your colon directly (sigmoidoscopy or colonoscopy). This is done to check for the earliest forms of colorectal cancer.  Routine screening usually begins at age 19.  Direct examination of the colon should be repeated every 5-10 years through 44 years of age. However, you may need to be screened more often if early forms of precancerous polyps or small growths are found.  Skin Cancer  Check your skin from head to toe regularly.  Tell your health care provider about any new moles or changes in moles, especially if there is a change in a mole's shape or color.  Also tell your health care provider if you have a mole that is larger than the size of a pencil eraser.  Always use sunscreen. Apply sunscreen liberally and repeatedly throughout the day.  Protect yourself by wearing long sleeves, pants, a wide-brimmed hat, and sunglasses whenever you are outside.  Heart disease, diabetes, and high blood pressure  High blood pressure causes heart disease and increases the risk of stroke. High blood pressure is more likely to develop in: ? People who have blood pressure in the high end of the normal range (130-139/85-89 mm Hg). ? People who are overweight or obese. ? People who are African American.  If you are 57-39 years of age, have your blood pressure checked every 3-5 years. If you are 19 years of age or older, have your blood pressure checked every year. You should have your blood pressure measured twice-once when you are at a hospital or clinic, and once when you are not at a hospital or clinic. Record the average of the two measurements. To check your blood pressure when you are not at a hospital or clinic, you can use: ? An automated blood pressure machine at a pharmacy. ? A home blood pressure monitor.  If  you are between 19 years and 38 years old, ask your health care provider if you should take aspirin to prevent strokes.  Have regular diabetes screenings. This involves taking a blood sample to check your fasting blood sugar level. ? If you are at a normal weight and have a low risk for diabetes, have this test once every three years after 44 years of age. ? If you are overweight and have a high risk for diabetes, consider being tested at a younger age or more often. Preventing infection Hepatitis B  If you have a higher risk for hepatitis B, you should be screened for this virus. You are considered at high risk for hepatitis B if: ? You were born in a country where hepatitis B is common. Ask your health care provider which countries are considered high risk. ? Your parents were born in a high-risk country, and you have not been immunized against hepatitis B (hepatitis B vaccine). ? You have HIV or AIDS. ? You use needles to inject street drugs. ? You live with someone who has hepatitis B. ? You have had sex with someone who has hepatitis B. ? You get hemodialysis treatment. ? You take certain medicines for conditions, including cancer, organ transplantation, and autoimmune conditions.  Hepatitis C  Blood testing is recommended for: ? Everyone born from [birth year] through 1963. ? Anyone with known risk factors for hepatitis C.  Sexually transmitted infections (STIs)  You should be screened for sexually transmitted infections (STIs) including gonorrhea and chlamydia if: ? You are sexually active and are younger than 44 years of age. ? You are older than 44 years of age and your health care provider tells you that you are at risk for this type of infection. ? Your sexual activity has changed since you were last screened and you are at an increased risk for chlamydia or gonorrhea. Ask your health care provider if you are at risk.  If you do not have HIV, but are at risk, it may be recommended  that you take a prescription medicine daily to prevent HIV infection. This is called pre-exposure prophylaxis (PrEP). You are considered at risk if: ? You are sexually active and do not regularly use condoms or know the HIV status of your partner(s). ? You take drugs by injection. ? You are sexually active with a partner who has HIV.  Talk with your health care provider about whether you are at high risk of  being infected with HIV. If you choose to begin PrEP, you should first be tested for HIV. You should then be tested every 3 months for as long as you are taking PrEP. Pregnancy  If you are premenopausal and you may become pregnant, ask your health care provider about preconception counseling.  If you may become pregnant, take 400 to 800 micrograms (mcg) of folic acid every day.  If you want to prevent pregnancy, talk to your health care provider about birth control (contraception). Osteoporosis and menopause  Osteoporosis is a disease in which the bones lose minerals and strength with aging. This can result in serious bone fractures. Your risk for osteoporosis can be identified using a bone density scan.  If you are 63 years of age or older, or if you are at risk for osteoporosis and fractures, ask your health care provider if you should be screened.  Ask your health care provider whether you should take a calcium or vitamin D supplement to lower your risk for osteoporosis.  Menopause may have certain physical symptoms and risks.  Hormone replacement therapy may reduce some of these symptoms and risks. Talk to your health care provider about whether hormone replacement therapy is right for you. Follow these instructions at home:  Schedule regular health, dental, and eye exams.  Stay current with your immunizations.  Do not use any tobacco products including cigarettes, chewing tobacco, or electronic cigarettes.  If you are pregnant, do not drink alcohol.  If you are  breastfeeding, limit how much and how often you drink alcohol.  Limit alcohol intake to no more than 1 drink per day for nonpregnant women. One drink equals 12 ounces of beer, 5 ounces of wine, or 1 ounces of hard liquor.  Do not use street drugs.  Do not share needles.  Ask your health care provider for help if you need support or information about quitting drugs.  Tell your health care provider if you often feel depressed.  Tell your health care provider if you have ever been abused or do not feel safe at home. This information is not intended to replace advice given to you by your health care provider. Make sure you discuss any questions you have with your health care provider. Document Released: 07/01/2011 Document Revised: 05/23/2016 Document Reviewed: 09/19/2015 Elsevier Interactive Patient Education  [employer].

## 2017-11-05 LAB — IGP, COBASHPV16/18
HPV 16: NEGATIVE
HPV 18: NEGATIVE
HPV other hr types: NEGATIVE
PAP Smear Comment: 0

## 2017-11-13 DIAGNOSIS — F329 Major depressive disorder, single episode, unspecified: Secondary | ICD-10-CM | POA: Diagnosis not present

## 2017-11-13 DIAGNOSIS — E669 Obesity, unspecified: Secondary | ICD-10-CM | POA: Diagnosis not present

## 2017-11-13 DIAGNOSIS — E8881 Metabolic syndrome: Secondary | ICD-10-CM | POA: Diagnosis not present

## 2017-11-13 DIAGNOSIS — F419 Anxiety disorder, unspecified: Secondary | ICD-10-CM | POA: Diagnosis not present

## 2017-11-27 ENCOUNTER — Ambulatory Visit: Payer: Medicare HMO | Admitting: Licensed Clinical Social Worker

## 2017-12-08 ENCOUNTER — Ambulatory Visit: Payer: Self-pay

## 2017-12-12 ENCOUNTER — Ambulatory Visit (INDEPENDENT_AMBULATORY_CARE_PROVIDER_SITE_OTHER): Payer: Medicare HMO | Admitting: Obstetrics and Gynecology

## 2017-12-12 VITALS — BP 123/80 | HR 97 | Ht 67.5 in | Wt 243.1 lb

## 2017-12-12 DIAGNOSIS — Z3042 Encounter for surveillance of injectable contraceptive: Secondary | ICD-10-CM | POA: Diagnosis not present

## 2017-12-12 MED ORDER — MEDROXYPROGESTERONE ACETATE 150 MG/ML IM SUSP: 150.0000 mg | Freq: Once | INTRAMUSCULAR | Status: DC

## 2017-12-12 NOTE — Progress Notes (Signed)
Last depo inj:09/22/17 UPT:no Side effects:none Next Depo- Provera injection due: 3/1-3/15/19 Annual exam due:11/19 [facility] [id number] right ventrogluteal BP 123/80   Pulse 97   Ht 5' 7.5" (1.715 m)   Wt 243 lb 1 oz (110.3 kg)   BMI 37.51 kg/m   I have reviewed the record and concur with patient management and plan. DEFRANCESCO, Hassell Done, MD, Cherlynn June

## 2017-12-16 ENCOUNTER — Encounter: Payer: Self-pay | Admitting: Psychiatry

## 2017-12-16 ENCOUNTER — Other Ambulatory Visit: Payer: Self-pay

## 2017-12-16 ENCOUNTER — Ambulatory Visit (INDEPENDENT_AMBULATORY_CARE_PROVIDER_SITE_OTHER): Payer: Medicare HMO | Admitting: Psychiatry

## 2017-12-16 VITALS — BP 142/86 | HR 105 | Temp 98.0°F | Wt 243.8 lb

## 2017-12-16 DIAGNOSIS — F39 Unspecified mood [affective] disorder: Secondary | ICD-10-CM | POA: Diagnosis not present

## 2017-12-16 DIAGNOSIS — F17200 Nicotine dependence, unspecified, uncomplicated: Secondary | ICD-10-CM | POA: Insufficient documentation

## 2017-12-16 DIAGNOSIS — F4000 Agoraphobia, unspecified: Secondary | ICD-10-CM

## 2017-12-16 DIAGNOSIS — F410 Panic disorder [episodic paroxysmal anxiety] without agoraphobia: Secondary | ICD-10-CM | POA: Diagnosis not present

## 2017-12-16 DIAGNOSIS — F1590 Other stimulant use, unspecified, uncomplicated: Secondary | ICD-10-CM | POA: Diagnosis not present

## 2017-12-16 MED ORDER — DULOXETINE HCL 30 MG PO CPEP: 30.0000 mg | ORAL_CAPSULE | Freq: Every day | ORAL | 2 refills | Status: DC

## 2017-12-16 MED ORDER — QUETIAPINE FUMARATE 50 MG PO TABS: 75.0000 mg | ORAL_TABLET | Freq: Every day | ORAL | 2 refills | Status: DC

## 2017-12-16 MED ORDER — PROPRANOLOL HCL 10 MG PO TABS: 10.0000 mg | ORAL_TABLET | Freq: Three times a day (TID) | ORAL | 2 refills | Status: DC | PRN

## 2017-12-16 MED ORDER — DULOXETINE HCL 60 MG PO CPEP: 60.0000 mg | ORAL_CAPSULE | Freq: Every day | ORAL | 2 refills | Status: DC

## 2017-12-16 MED ORDER — GABAPENTIN 600 MG PO TABS: ORAL_TABLET | ORAL | 2 refills | Status: DC

## 2017-12-16 MED ORDER — TOPIRAMATE 100 MG PO TABS: 150.0000 mg | ORAL_TABLET | Freq: Every day | ORAL | 2 refills | Status: DC

## 2017-12-16 NOTE — Progress Notes (Signed)
[facility] MD OP Progress Note  12/16/2017 2:41 PM Mary Koch  MRN:  [id number]  Chief Complaint: ' I am anxious.'  Chief Complaint    Follow-up; Medication Refill     BWG:YKZLD is a 46 year old Caucasian female, lives in [place], has a history of depression and anxiety symptoms, is on SSD, presented to the clinic for a follow-up visit.  Msity today presented with her son Denyse Amass ( 2 nd grade)  for this evaluation.  Nohea today reports that she is not feeling well.  She reports she has some GI issues.  She reports she has been feeling nauseous and had some abdominal pain the past couple of days.  She feels like it could be her ulcers that could be causing her the symptoms.  She reports that she plans to follow-up with her PMD as soon as possible.  Wilbert also reports that the holiday season has been getting more and more stressful.  She reports that she has been unable to make ends meet and also do all the things that she wants to do for her family for the holidays.  She reports that her mother let her borrow some money to buy a Christmas gift for her father.  She reports that she spent it on air conditioning and heat and now she feels guilty.  She also reports that her father is currently not doing well health wise.  She worries about him.  Continues to have some anxiety attacks and panic attacks.  She continues to avoid going into crowded places like [employer].  She reports that she goes to [employer] at 2 AM in the morning to do her shopping.  She reports that she tried the hydroxyzine but it is not helping.  She continues to stay compliant on the Cymbalta , gabapentin, Seroquel and Topamax.  She reports she felt the Topamax helped with her cravings and also weight gain issues in the past ,she however wonders if it's  helpful anymore.  She denies any suicidality, perceptual disturbances at this time.  She wants to continue psychotherapy with Ms. [name].  She reports one of her previous appointments were  rescheduled and she is hoping to see her as soon as possible.  She reports she has been cutting down on the caffeine.    Visit Diagnosis:    ICD-10-CM   1. Panic disorder F41.0 DULoxetine (CYMBALTA) 60 MG capsule    DULoxetine (CYMBALTA) 30 MG capsule    propranolol (INDERAL) 10 MG tablet    QUEtiapine (SEROQUEL) 50 MG tablet    topiramate (TOPAMAX) 100 MG tablet  2. 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  History of suicide attempt at the age of 42 when she overdosed on Tylenol.  She was not admitted to the hospital at that time.  She used to follow up with Dr. Gretel Acre and also with [facility] behavioral health in the past.  Past trials of Paxil, Luvox, Klonopin, Zoloft, Seroquel, Wellbutrin.  Past Medical History:  Past Medical History:  Diagnosis Date  . Allergic rhinitis   . Anemia   . Anxiety   . Anxiety and depression   . Carpal tunnel syndrome   . Depression   . Fibroids   . GERD (gastroesophageal reflux disease)   . Headache(784.0)   . Headache(784.0)   . Heavy periods   . Hyperthyroidism  right portion of thyroid removed  . Muscle spasm   . Neurological disorder    evaluation for ms  . Painful menstrual periods   . Shortness of breath    when i smoke a lot    Past Surgical History:  Procedure Laterality Date  . BREAST BIOPSY Left    neg  . carpel tunnel Left 2017  . CESAREAN SECTION     times 2  . CHOLECYSTECTOMY    . THYROID LOBECTOMY      Family Psychiatric History: Father-PTSD due to agent orange exposure.  Brother-alcoholic.  No history of suicide attempt in the family.  Family History:  Family History  Problem Relation Age of Onset  . Osteoporosis Mother   . Diabetes Father   . Anxiety disorder Father   . Depression Father   . Post-traumatic stress disorder Father   . Heart disease Father   .  Anxiety disorder Sister   . Alcohol abuse Brother   . Breast cancer Neg Hx   . Colon cancer Neg Hx     Social History: Currently separated from her husband since 2013.  She has 2 boys aged 108 and 44 years old.  Her 48-year-old her son has been diagnosed with autism.  She is currently on SSD as well as she gets SSI for her child and also gets child support from her husband. Social History   Socioeconomic History  . Marital status: Legally Separated    Spouse name: None  . Number of children: None  . Years of education: None  . Highest education level: None  Social Needs  . Financial resource strain: None  . Food insecurity - worry: None  . Food insecurity - inability: None  . Transportation needs - medical: None  . Transportation needs - non-medical: None  Occupational History  . None  Tobacco Use  . Smoking status: Current Every Day Smoker    Packs/day: 1.50    Years: 25.00    Pack years: 37.50    Types: Cigarettes    Start date: 05/22/1986  . Smokeless tobacco: Never Used  Substance and Sexual Activity  . Alcohol use: No    Alcohol/week: 0.0 oz  . Drug use: No  . Sexual activity: Yes    Partners: Male    Birth control/protection: Surgical  Other Topics Concern  . None  Social History Narrative  . None    Allergies:  Allergies  Allergen Reactions  . Azithromycin Other (See Comments)    headache  . Codeine Other (See Comments)    unknown  . Penicillins Other (See Comments)    headaches    Metabolic Disorder Labs: No results found for: HGBA1C, MPG No results found for: PROLACTIN No results found for: CHOL, TRIG, HDL, CHOLHDL, VLDL, LDLCALC No results found for: TSH  Therapeutic Level Labs: No results found for: LITHIUM No results found for: VALPROATE No components found for:  CBMZ  Current Medications: Current Outpatient Medications  Medication Sig Dispense Refill  . albuterol (PROVENTIL HFA;VENTOLIN HFA) 108 (90 Base) MCG/ACT inhaler INHALE 1 TO 2  PUFFS DAILY    . BIOTIN PO Take by mouth.    Marland Kitchen BLACK ELDERBERRY,BERRY-FLOWER, PO Take by mouth.    . Cyanocobalamin (VITAMIN B 12 PO) Take by mouth.    . cyclobenzaprine (FLEXERIL) 10 MG tablet Take 10 mg by mouth 3 (three) times daily as needed for muscle spasms.    . fluticasone (FLONASE) 50 MCG/ACT nasal spray Place 1 spray into both  nostrils daily.  4  . Fluticasone Furoate-Vilanterol (BREO ELLIPTA) 100-25 MCG/INH AEPB INHALE 1 PUFF BY MOUTH ONCE DAILY    . furosemide (LASIX) 20 MG tablet TAKE 1 TABLET BY MOUTH TWICE A WEEK  5  . gabapentin (NEURONTIN) 600 MG tablet TAKE 1 TABLET BY MOUTH IN THE MORNING AND 1/2 TABLET AT NIGHT 45 tablet 2  . MedroxyPROGESTERone Acetate 150 MG/ML SUSY Inject 1 mL (150 mg total) into the muscle every 3 (three) months. 1 Syringe 2  . nystatin-triamcinolone (MYCOLOG II) cream Apply 1 application topically 2 (two) times daily. 30 g 1  . omeprazole (PRILOSEC) 40 MG capsule Take 40 mg by mouth daily.    . phentermine (ADIPEX-P) 37.5 MG tablet Take 37.5 mg by mouth daily.  2  . QUEtiapine (SEROQUEL) 50 MG tablet Take 1.5 tablets (75 mg total) by mouth at bedtime. 45 tablet 2  . rosuvastatin (CRESTOR) 20 MG tablet Take 20 mg by mouth daily.    Marland Kitchen topiramate (TOPAMAX) 100 MG tablet Take 1.5 tablets (150 mg total) by mouth daily. 45 tablet 2  . tretinoin (RETIN-A) 0.1 % cream APPLY TO AFFECTED AREA AT BEDTIME  2  . DULoxetine (CYMBALTA) 30 MG capsule Take 1 capsule (30 mg total) by mouth daily. Take it along with 60 mg daily to make it 90 mg 30 capsule 2  . DULoxetine (CYMBALTA) 60 MG capsule Take 1 capsule (60 mg total) by mouth daily. To be taken with 30 mg to make it 90 mg 30 capsule 2  . propranolol (INDERAL) 10 MG tablet Take 1 tablet (10 mg total) by mouth 3 (three) times daily as needed. 90 tablet 2   No current facility-administered medications for this visit.      Musculoskeletal: Strength & Muscle Tone: within normal limits Gait & Station:  normal Patient leans: N/A  Psychiatric Specialty Exam: Review of Systems  Psychiatric/Behavioral: Positive for depression. The patient is nervous/anxious.   All other systems reviewed and are negative.   Blood pressure (!) 142/86, pulse (!) 105, temperature 98 F (36.7 C), temperature source Oral, weight 243 lb 12.8 oz (110.6 kg).Body mass index is 37.62 kg/m.  General Appearance: Casual  Eye Contact:  Fair  Speech:  Clear and Coherent  Volume:  Normal  Mood:  Anxious  Affect:  Congruent  Thought Process:  Goal Directed and Descriptions of Associations: Intact  Orientation:  Full (Time, Place, and Person)  Thought Content: Logical   Suicidal Thoughts:  No  Homicidal Thoughts:  No  Memory:  Immediate;   Fair Recent;   Fair Remote;   Fair  Judgement:  Fair  Insight:  Fair  Psychomotor Activity:  Normal  Concentration:  Concentration: Fair and Attention Span: Fair  Recall:  [employer] of Knowledge: Fair  Language: Fair  Akathisia:  No  Handed:  Right  AIMS (if indicated): denies rigidity, tremors   Assets:  Communication Skills Desire for Improvement Housing Social Support  ADL's:  Intact  Cognition: WNL  Sleep:  Fair   Screenings: AUDIT     Office Visit from 05/22/2016 in [facility]  Alcohol Use Disorder Identification Test Final Score (AUDIT)  0    PHQ2-9     Office Visit from 05/22/2016 in [facility]  PHQ-2 Total Score  4  PHQ-9 Total Score  18       Assessment and Plan:Anjani is a 46 year old Caucasian female with history of anxiety, depression, mood lability. She continues to struggle  with mood symptoms as well as anxiety issues on a regular basis.  She reports recent psychosocial stressors of her own health issues as well as financial strain during the holidays as well as her father's health problems.  Discussed medication changes as well as she will continue to follow-up with Ms. Royal Piedra for  CBT.  Plan as noted below  Plan For anxiety /panic do Increase Cymbalta to 90 mg po daily. Neurontin 600 mg po daily and 300 mg po qhs. Discontinue vistaril for lack of efficacy. Start Propranolol 10 mg po tid prn. Continue CBT.  For depressive sx Continue Seroquel 75 mg po qhs. Topamax 150 mg po daily. Cymbalta 90 mg po daily.  For insomnia Seroquel 75 mg po qhs Sleep hygiene discussed.  For agorophobia CBT with Ms.Peacocok.  Caffeine abuse She is trying to cut down.  More than 50 % of the time was spent for psychoeducation and supportive psychotherapy and care coordination.  Follow up in 6 weeks or sooner if needed.  This note was generated in part or whole with voice recognition software. Voice recognition is usually quite accurate but there are transcription errors that can and very often do occur. I apologize for any typographical errors that were not detected and corrected.          Ursula Alert, MD 12/16/2017, 2:41 PM

## 2017-12-17 ENCOUNTER — Ambulatory Visit: Payer: Medicare HMO | Admitting: Licensed Clinical Social Worker

## 2017-12-31 ENCOUNTER — Ambulatory Visit (INDEPENDENT_AMBULATORY_CARE_PROVIDER_SITE_OTHER): Payer: Medicare HMO | Admitting: Licensed Clinical Social Worker

## 2017-12-31 DIAGNOSIS — F39 Unspecified mood [affective] disorder: Secondary | ICD-10-CM

## 2018-01-19 NOTE — Progress Notes (Signed)
   THERAPIST PROGRESS NOTE  Session Time: 5min  Participation Level: Active  Behavioral Response: CasualAlertEuthymic  Type of Therapy: Individual Therapy  Treatment Goals addressed: Anxiety and Coping  Interventions: CBT and Motivational Interviewing  Summary: Nayzeth Altman is a 47 y.o. female who presents with continued symptoms of her diagnosis.  Patient reports a "unfavorable" mood.  Patient reports that she has made a bad decision by allowing a friend to move in the home with her and her children.  SHe reports that she is already regretting the decision.  She reports that no ground rules have been set.  SHe reports that she wants her personal space back.  Explored relationship with friends, spouse and parents.  Dicussed how those relationships have changed over the past few years.  Explored with Patient what and how she wants to change her relationships.  Inadequate pattern of communication with partner, family members, employer leading to frequent arguing and conflicts.   Suicidal/Homicidal: No  Therapist Response: LCSW provided Patient with ongoing emotional support and encouragement.  Normalized her feelings.  Commended Patient on her progress and reinforced the importance of client staying focused on her own strengths and resources and resiliency. Processed various strategies for dealing with stressors.    Plan: Return again in 4weeks.  Diagnosis: Axis I: Mood Disorder NOS    Axis II: No diagnosis    Lubertha South, LCSW 01/01/2018

## 2018-01-27 ENCOUNTER — Encounter: Payer: Self-pay | Admitting: Psychiatry

## 2018-01-27 ENCOUNTER — Ambulatory Visit (INDEPENDENT_AMBULATORY_CARE_PROVIDER_SITE_OTHER): Payer: Medicare HMO | Admitting: Psychiatry

## 2018-01-27 ENCOUNTER — Other Ambulatory Visit: Payer: Self-pay

## 2018-01-27 VITALS — BP 117/81 | HR 91 | Temp 98.7°F | Wt 242.2 lb

## 2018-01-27 DIAGNOSIS — F339 Major depressive disorder, recurrent, unspecified: Secondary | ICD-10-CM | POA: Diagnosis not present

## 2018-01-27 DIAGNOSIS — F410 Panic disorder [episodic paroxysmal anxiety] without agoraphobia: Secondary | ICD-10-CM | POA: Diagnosis not present

## 2018-01-27 DIAGNOSIS — F1590 Other stimulant use, unspecified, uncomplicated: Secondary | ICD-10-CM | POA: Diagnosis not present

## 2018-01-27 DIAGNOSIS — F4000 Agoraphobia, unspecified: Secondary | ICD-10-CM

## 2018-01-27 MED ORDER — GABAPENTIN 600 MG PO TABS: 600.0000 mg | ORAL_TABLET | Freq: Two times a day (BID) | ORAL | 2 refills | Status: DC

## 2018-01-27 MED ORDER — QUETIAPINE FUMARATE 100 MG PO TABS
100.0000 mg | ORAL_TABLET | Freq: Every day | ORAL | 2 refills | Status: DC
Start: 2018-01-27 — End: 2018-03-24

## 2018-01-27 NOTE — Progress Notes (Signed)
[facility] MD OP Progress Note  01/27/2018 4:08 PM Mary Koch  MRN:  [id number]  Chief Complaint: ' I am sad."  Chief Complaint    Follow-up; Medication Refill     HPI: Mary Koch is a 47 year old Caucasian female, lives in [place], has a history of depression and anxiety symptoms, is on SSD, presented to the clinic today for a follow-up visit.   Mary Koch today presents as very sad and tearful.  Mary Koch reports her father was recently diagnosed with late stage cancer.  Mary Koch reports that the doctors have given her father only 6 months or less to live.  Majority of the time during the session was spent providing supportive psychotherapy as well as working on her coping skills to deal with her current stressors.  Mary Koch continues to struggle with depression and anxiety on a regular basis.  Mary Koch reports Mary Koch feels sad most of the time.  Mary Koch also reports some passive SI however denies any active suicidal thoughts at this time.  Mary Koch reports her ex-husband has been pitching in more and that has been helpful.  Mary Koch however feels more depressed and questions her life's purpose when Mary Koch sees her children having a lot of fun with her ex-husband.  Mary Koch however reports Mary Koch is not actively suicidal and would ask for help if Mary Koch feels that way.  Mary Koch would like to continue psychotherapy with Ms. [name].  Mary Koch has an appointment scheduled for next week.  Mary Koch reports sleep is okay.  Mary Koch reports the medications helps her to rest.  Mary Koch has been cutting down on the caffeine and is not using it as Mary Koch used to before Mary Koch continues to avoid social situations but is able to make herself go out grocery shopping and things like that when Mary Koch has to.  Mary Koch reports Mary Koch is okay with readjusting her medication dosage today.  Visit Diagnosis:    ICD-10-CM   1. Panic disorder F41.0   2. MDD (major depressive disorder), recurrent episode, with atypical features (HCC) F33.9 QUEtiapine (SEROQUEL) 100 MG tablet  3. Agoraphobia F40.00 gabapentin  (NEURONTIN) 600 MG tablet  4. 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  History of suicide attempt at the age of 89 when Mary Koch overdosed on Tylenol.  Mary Koch was not admitted to the hospital at that time.  Mary Koch used to follow up with Dr. Gretel Acre and also with [facility] behavioral health in the past.  Past trials of Paxil, Luvox, Klonopin, Zoloft, Seroquel and Wellbutrin.  Past Medical History:  Past Medical History:  Diagnosis Date  . Allergic rhinitis   . Anemia   . Anxiety   . Anxiety and depression   . Carpal tunnel syndrome   . Depression   . Fibroids   . GERD (gastroesophageal reflux disease)   . Headache(784.0)   . Headache(784.0)   . Heavy periods   . Hyperthyroidism    right portion of thyroid removed  . Muscle spasm   . Neurological disorder    evaluation for ms  . Painful menstrual periods   . Shortness of breath    when i smoke a lot    Past Surgical History:  Procedure Laterality Date  . BREAST BIOPSY Left    neg  . carpel tunnel Left 2017  . CESAREAN SECTION     times 2  . CHOLECYSTECTOMY    . THYROID LOBECTOMY      Family Psychiatric History: The-PTSD due to agent orange exposure.  Brother-alcoholic.  No history of suicide  in the family.  Family History:  Family History  Problem Relation Age of Onset  . Osteoporosis Mother   . Diabetes Father   . Anxiety disorder Father   . Depression Father   . Post-traumatic stress disorder Father   . Heart disease Father   . Anxiety disorder Sister   . Alcohol abuse Brother   . Breast cancer Neg Hx   . Colon cancer Neg Hx    Substance abuse history: Caffeine abuse - trying to cut down  Social History: Mary Koch is currently separated from her husband since 2013.  Mary Koch has 2 boys aged 62 and 45 years old.  Her 71-year-old son has been diagnosed with autism.  Mary Koch is currently on SSD as well as Mary Koch gets SSI for her child and also gets child support from her husband. Social  History   Socioeconomic History  . Marital status: Legally Separated    Spouse name: None  . Number of children: None  . Years of education: None  . Highest education level: None  Social Needs  . Financial resource strain: None  . Food insecurity - worry: None  . Food insecurity - inability: None  . Transportation needs - medical: None  . Transportation needs - non-medical: None  Occupational History  . None  Tobacco Use  . Smoking status: Current Every Day Smoker    Packs/day: 1.50    Years: 25.00    Pack years: 37.50    Types: Cigarettes    Start date: 05/22/1986  . Smokeless tobacco: Never Used  Substance and Sexual Activity  . Alcohol use: No    Alcohol/week: 0.0 oz  . Drug use: No  . Sexual activity: Yes    Partners: Male    Birth control/protection: Surgical  Other Topics Concern  . None  Social History Narrative  . None    Allergies:  Allergies  Allergen Reactions  . Azithromycin Other (See Comments)    headache  . Codeine Other (See Comments)    unknown  . Penicillins Other (See Comments)    headaches    Metabolic Disorder Labs: No results found for: HGBA1C, MPG No results found for: PROLACTIN No results found for: CHOL, TRIG, HDL, CHOLHDL, VLDL, LDLCALC No results found for: TSH  Therapeutic Level Labs: No results found for: LITHIUM No results found for: VALPROATE No components found for:  CBMZ  Current Medications: Current Outpatient Medications  Medication Sig Dispense Refill  . albuterol (PROVENTIL HFA;VENTOLIN HFA) 108 (90 Base) MCG/ACT inhaler INHALE 1 TO 2 PUFFS DAILY    . BIOTIN PO Take by mouth.    Marland Kitchen BLACK ELDERBERRY,BERRY-FLOWER, PO Take by mouth.    . Cyanocobalamin (VITAMIN B 12 PO) Take by mouth.    . cyclobenzaprine (FLEXERIL) 10 MG tablet Take 10 mg by mouth 3 (three) times daily as needed for muscle spasms.    . DULoxetine (CYMBALTA) 30 MG capsule Take 1 capsule (30 mg total) by mouth daily. Take it along with 60 mg daily to  make it 90 mg 30 capsule 2  . DULoxetine (CYMBALTA) 60 MG capsule Take 1 capsule (60 mg total) by mouth daily. To be taken with 30 mg to make it 90 mg 30 capsule 2  . fluticasone (FLONASE) 50 MCG/ACT nasal spray Place 1 spray into both nostrils daily.  4  . Fluticasone Furoate-Vilanterol (BREO ELLIPTA) 100-25 MCG/INH AEPB INHALE 1 PUFF BY MOUTH ONCE DAILY    . furosemide (LASIX)  20 MG tablet TAKE 1 TABLET BY MOUTH TWICE A WEEK  5  . gabapentin (NEURONTIN) 600 MG tablet Take 1 tablet (600 mg total) by mouth 2 (two) times daily. 60 tablet 2  . MedroxyPROGESTERone Acetate 150 MG/ML SUSY Inject 1 mL (150 mg total) into the muscle every 3 (three) months. 1 Syringe 2  . nystatin-triamcinolone (MYCOLOG II) cream Apply 1 application topically 2 (two) times daily. 30 g 1  . omeprazole (PRILOSEC) 40 MG capsule Take 40 mg by mouth daily.    . phentermine (ADIPEX-P) 37.5 MG tablet Take 37.5 mg by mouth daily.  2  . propranolol (INDERAL) 10 MG tablet Take 1 tablet (10 mg total) by mouth 3 (three) times daily as needed. 90 tablet 2  . QUEtiapine (SEROQUEL) 100 MG tablet Take 1 tablet (100 mg total) by mouth at bedtime. 30 tablet 2  . rosuvastatin (CRESTOR) 20 MG tablet Take 20 mg by mouth daily.    Marland Kitchen topiramate (TOPAMAX) 100 MG tablet Take 1.5 tablets (150 mg total) by mouth daily. 45 tablet 2  . tretinoin (RETIN-A) 0.1 % cream APPLY TO AFFECTED AREA AT BEDTIME  2   No current facility-administered medications for this visit.      Musculoskeletal: Strength & Muscle Tone: within normal limits Gait & Station: normal Patient leans: N/A  Psychiatric Specialty Exam: Review of Systems  Psychiatric/Behavioral: Positive for depression. The patient is nervous/anxious.   All other systems reviewed and are negative.   Blood pressure 117/81, pulse 91, temperature 98.7 F (37.1 C), temperature source Oral, weight 242 lb 3.2 oz (109.9 kg).Body mass index is 37.37 kg/m.  General Appearance: Casual  Eye Contact:   Fair  Speech:  Clear and Coherent  Volume:  Decreased  Mood:  Anxious, Depressed and Dysphoric  Affect:  Tearful  Thought Process:  Goal Directed and Descriptions of Associations: Intact  Orientation:  Full (Time, Place, and Person)  Thought Content: Logical   Suicidal Thoughts:  No  Homicidal Thoughts:  No  Memory:  Immediate;   Fair Recent;   Fair Remote;   Fair  Judgement:  Fair  Insight:  Fair  Psychomotor Activity:  Normal  Concentration:  Concentration: Fair and Attention Span: Fair  Recall:  [employer] of Knowledge: Fair  Language: Fair  Akathisia:  No  Handed:  Right  AIMS (if indicated):denies tremors, rigidity  Assets:  Communication Skills Desire for Improvement Housing  ADL's:  Intact  Cognition: WNL  Sleep:  Fair   Screenings: AUDIT     Office Visit from 05/22/2016 in [facility]  Alcohol Use Disorder Identification Test Final Score (AUDIT)  0    PHQ2-9     Office Visit from 05/22/2016 in [facility]  PHQ-2 Total Score  4  PHQ-9 Total Score  18       Assessment and Plan: Mary Koch is a 47 year old Caucasian female with history of anxiety, depression, mood lability.  Mary Koch is currently distressed by the recent cancer diagnosis in her father.  Mary Koch was very tearful during the visit.  Some time was spent providing psychotherapy.  Discussed medication management.  Mary Koch will also continue to pursue psychotherapy with Ms. [name].  Plan as noted below.   Plan For anxiety-panic disorder Continue Cymbalta 90 mg p.o. daily Increase Neurontin to 600 mg p.o. twice daily Continue propranolol 10 mg p.o. 3 times daily as needed Continue CBT  For depressive symptoms Increase Seroquel 100 mg p.o. nightly Continue Topamax 150 mg  p.o. daily Continue Cymbalta 90 mg p.o. Daily Continue CBT.  For insomnia Increase Seroquel 100 mg p.o. nightly   For agoraphobia CBT with Ms. [name]  Caffeine abuse Mary Koch continues  to try to cut down.  Provided supportive psychotherapy for more than 15 minutes.  Follow up in 8 weeks or sooner if needed.  In the meantime Mary Koch will follow-up with Ms. [name].  More than 50 % of the time was spent for psychoeducation and supportive psychotherapy and care coordination.  This note was generated in part or whole with voice recognition software. Voice recognition is usually quite accurate but there are transcription errors that can and very often do occur. I apologize for any typographical errors that were not detected and corrected.         Ursula Alert, MD 01/27/2018, 4:08 PM

## 2018-01-28 ENCOUNTER — Encounter: Payer: Self-pay | Admitting: Psychiatry

## 2018-02-02 ENCOUNTER — Ambulatory Visit: Payer: Medicare HMO | Admitting: Licensed Clinical Social Worker

## 2018-02-19 ENCOUNTER — Ambulatory Visit: Payer: Medicare HMO | Admitting: Licensed Clinical Social Worker

## 2018-02-23 DIAGNOSIS — F329 Major depressive disorder, single episode, unspecified: Secondary | ICD-10-CM | POA: Diagnosis not present

## 2018-02-23 DIAGNOSIS — Z8719 Personal history of other diseases of the digestive system: Secondary | ICD-10-CM | POA: Diagnosis not present

## 2018-02-23 DIAGNOSIS — E8881 Metabolic syndrome: Secondary | ICD-10-CM | POA: Diagnosis not present

## 2018-02-23 DIAGNOSIS — F419 Anxiety disorder, unspecified: Secondary | ICD-10-CM | POA: Diagnosis not present

## 2018-03-06 ENCOUNTER — Ambulatory Visit (INDEPENDENT_AMBULATORY_CARE_PROVIDER_SITE_OTHER): Payer: Medicare HMO | Admitting: Obstetrics and Gynecology

## 2018-03-06 VITALS — BP 120/74 | HR 88 | Wt 239.2 lb

## 2018-03-06 DIAGNOSIS — Z3042 Encounter for surveillance of injectable contraceptive: Secondary | ICD-10-CM | POA: Diagnosis not present

## 2018-03-06 MED ORDER — MEDROXYPROGESTERONE ACETATE 150 MG/ML IM SUSP
150.0000 mg | Freq: Once | INTRAMUSCULAR | Status: AC
  Administered 2018-03-06: 150 mg via INTRAMUSCULAR

## 2018-03-06 NOTE — Progress Notes (Signed)
Pt is here for depo provera inj She is doing well No side effects reported  BP 120/74   Pulse 88   Wt 239 lb 3.2 oz (108.5 kg)   BMI 36.91 kg/m   Depo-Provera given without complication  Return in 12 weeks  I have reviewed the record and concur with patient management and plan. Drinda Belgard, Hassell Done, MD, FACOG  Brayton Mars, MD

## 2018-03-11 ENCOUNTER — Ambulatory Visit (INDEPENDENT_AMBULATORY_CARE_PROVIDER_SITE_OTHER): Payer: Medicare HMO | Admitting: Licensed Clinical Social Worker

## 2018-03-11 DIAGNOSIS — F410 Panic disorder [episodic paroxysmal anxiety] without agoraphobia: Secondary | ICD-10-CM | POA: Diagnosis not present

## 2018-03-24 ENCOUNTER — Ambulatory Visit (INDEPENDENT_AMBULATORY_CARE_PROVIDER_SITE_OTHER): Payer: Medicare HMO | Admitting: Psychiatry

## 2018-03-24 ENCOUNTER — Encounter: Payer: Self-pay | Admitting: Psychiatry

## 2018-03-24 ENCOUNTER — Other Ambulatory Visit: Payer: Self-pay

## 2018-03-24 VITALS — BP 112/76 | HR 97 | Temp 98.5°F | Wt 241.8 lb

## 2018-03-24 DIAGNOSIS — F1590 Other stimulant use, unspecified, uncomplicated: Secondary | ICD-10-CM | POA: Diagnosis not present

## 2018-03-24 DIAGNOSIS — F17200 Nicotine dependence, unspecified, uncomplicated: Secondary | ICD-10-CM | POA: Diagnosis not present

## 2018-03-24 DIAGNOSIS — F410 Panic disorder [episodic paroxysmal anxiety] without agoraphobia: Secondary | ICD-10-CM | POA: Diagnosis not present

## 2018-03-24 DIAGNOSIS — F339 Major depressive disorder, recurrent, unspecified: Secondary | ICD-10-CM

## 2018-03-24 DIAGNOSIS — F4000 Agoraphobia, unspecified: Secondary | ICD-10-CM

## 2018-03-24 MED ORDER — DULOXETINE HCL 60 MG PO CPEP: 60.0000 mg | ORAL_CAPSULE | Freq: Every day | ORAL | 2 refills | Status: DC

## 2018-03-24 MED ORDER — PROPRANOLOL HCL 10 MG PO TABS: 10.0000 mg | ORAL_TABLET | Freq: Three times a day (TID) | ORAL | 2 refills | Status: DC | PRN

## 2018-03-24 MED ORDER — QUETIAPINE FUMARATE 100 MG PO TABS: 100.0000 mg | ORAL_TABLET | Freq: Every day | ORAL | 2 refills | Status: DC

## 2018-03-24 MED ORDER — GABAPENTIN 600 MG PO TABS: 600.0000 mg | ORAL_TABLET | Freq: Two times a day (BID) | ORAL | 2 refills | Status: DC

## 2018-03-24 MED ORDER — DULOXETINE HCL 30 MG PO CPEP: 30.0000 mg | ORAL_CAPSULE | Freq: Every day | ORAL | 2 refills | Status: DC

## 2018-03-24 NOTE — Progress Notes (Signed)
[facility] OP Progress Note  03/24/2018 5:52 PM Mary Koch  MRN:  [id number]  Chief Complaint: ' I am ok." Chief Complaint    Follow-up; Medication Refill     HPI: Mary Koch is a 47 year old Caucasian female, lives in [place], has a history of depression, anxiety, on SSD, presented to the clinic today for a follow-up visit.  Mary Koch recently lost her father.  Her father was diagnosed with late stage cancer.  Patient reports she believes that life does exist after death and that he is in a better place at this time.  She however reports it can be challenging at times.  Patient reports she does not have a lot of social support.  She reports she tried talking to her ex-husband but he has not been very supportive.  Provided grief therapy for patient.  Discussed the several stages of grief.  Discussed with her that grief can be different for different individuals and that we all  heal at our own pace.  Discussed with patient to pursue grief counseling with Ms. Royal Piedra her therapist.  Patient agrees with plan.  She continues to be compliant with medications.  She reports her medications at the current dose is helping her.  She denies any side effects.  She has been cutting down on her caffeine use.  She used to use 12 pack of Mountain Dews a day in the past.  She currently uses 1 per day.  She continues to smoke cigarettes.  She does not think she is ready to quit at this time.  She denies any other suicidality or perceptual disturbances.   Visit Diagnosis:    ICD-10-CM   1. MDD (major depressive disorder), recurrent episode, with atypical features (HCC) F33.9 QUEtiapine (SEROQUEL) 100 MG tablet  2. Panic disorder F41.0 DULoxetine (CYMBALTA) 30 MG capsule    DULoxetine (CYMBALTA) 60 MG capsule    propranolol (INDERAL) 10 MG tablet  3. Agoraphobia F40.00 gabapentin (NEURONTIN) 600 MG tablet  4. 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 Tobacco use disorder F17.200     Past Psychiatric History: Hx  of depression and anxiety.  History of suicide attempt at the age of 48 when she overdosed on Tylenol.  She was not admitted to the hospital at that time.  she used to follow up with Dr. Gretel Acre and also with [facility] behavioral health in the past.  Past trials of Paxil, Luvox, Klonopin, Zoloft, Seroquel and Wellbutrin.  Past Medical History:  Past Medical History:  Diagnosis Date  . Allergic rhinitis   . Anemia   . Anxiety   . Anxiety and depression   . Carpal tunnel syndrome   . Depression   . Fibroids   . GERD (gastroesophageal reflux disease)   . Headache(784.0)   . Headache(784.0)   . Heavy periods   . Hyperthyroidism    right portion of thyroid removed  . Muscle spasm   . Neurological disorder    evaluation for ms  . Painful menstrual periods   . Shortness of breath    when i smoke a lot    Past Surgical History:  Procedure Laterality Date  . BREAST BIOPSY Left    neg  . carpel tunnel Left 2017  . CESAREAN SECTION     times 2  . CHOLECYSTECTOMY    . THYROID LOBECTOMY      Family Psychiatric History: Father -PTSD due to agent orange.  Brother-alcoholic.  No history of suicide in family  Family History:  Family History  Problem Relation Age of Onset  . Osteoporosis Mother   . Diabetes Father   . Anxiety disorder Father   . Depression Father   . Post-traumatic stress disorder Father   . Heart disease Father   . Anxiety disorder Sister   . Alcohol abuse Brother   . Breast cancer Neg Hx   . Colon cancer Neg Hx    Substance abuse history: Caffeine abuse-trying to cut down  Social History: Currently separated from her husband since 2013.  She has 2 boys ages 65 and 45 years old.  Her 31-year-old son has been diagnosed with autism.  She is currently on SSD as well as she gets SSI for her child and also gets child support from her husband. Social History   Socioeconomic History  . Marital status: Legally Separated    Spouse name: Not on file  . Number of  children: Not on file  . Years of education: Not on file  . Highest education level: Not on file  Occupational History  . Not on file  Social Needs  . Financial resource strain: Not on file  . Food insecurity:    Worry: Not on file    Inability: Not on file  . Transportation needs:    Medical: Not on file    Non-medical: Not on file  Tobacco Use  . Smoking status: Current Every Day Smoker    Packs/day: 1.50    Years: 25.00    Pack years: 37.50    Types: Cigarettes    Start date: 05/22/1986  . Smokeless tobacco: Never Used  Substance and Sexual Activity  . Alcohol use: No    Alcohol/week: 0.0 oz  . Drug use: No  . Sexual activity: Yes    Partners: Male    Birth control/protection: Surgical  Lifestyle  . Physical activity:    Days per week: Not on file    Minutes per session: Not on file  . Stress: Not on file  Relationships  . Social connections:    Talks on phone: Not on file    Gets together: Not on file    Attends religious service: Not on file    Active member of club or organization: Not on file    Attends meetings of clubs or organizations: Not on file    Relationship status: Not on file  Other Topics Concern  . Not on file  Social History Narrative  . Not on file    Allergies:  Allergies  Allergen Reactions  . Azithromycin Other (See Comments)    headache  . Codeine Other (See Comments)    unknown  . Penicillins Other (See Comments)    headaches    Metabolic Disorder Labs: No results found for: HGBA1C, MPG No results found for: PROLACTIN No results found for: CHOL, TRIG, HDL, CHOLHDL, VLDL, LDLCALC No results found for: TSH  Therapeutic Level Labs: No results found for: LITHIUM No results found for: VALPROATE No components found for:  CBMZ  Current Medications: Current Outpatient Medications  Medication Sig Dispense Refill  . albuterol (PROVENTIL HFA;VENTOLIN HFA) 108 (90 Base) MCG/ACT inhaler INHALE 1 TO 2 PUFFS DAILY    . BIOTIN PO Take  by mouth.    Marland Kitchen BLACK ELDERBERRY,BERRY-FLOWER, PO Take by mouth.    . Cyanocobalamin (VITAMIN B 12 PO) Take by mouth.    . cyclobenzaprine (FLEXERIL) 10 MG tablet Take 10 mg by mouth 3 (three) times daily as needed for muscle spasms.    Marland Kitchen  DULoxetine (CYMBALTA) 30 MG capsule Take 1 capsule (30 mg total) by mouth daily. Take it along with 60 mg daily to make it 90 mg 30 capsule 2  . DULoxetine (CYMBALTA) 60 MG capsule Take 1 capsule (60 mg total) by mouth daily. To be taken with 30 mg to make it 90 mg 30 capsule 2  . fluticasone (FLONASE) 50 MCG/ACT nasal spray Place 1 spray into both nostrils daily.  4  . Fluticasone Furoate-Vilanterol (BREO ELLIPTA) 100-25 MCG/INH AEPB INHALE 1 PUFF BY MOUTH ONCE DAILY    . furosemide (LASIX) 20 MG tablet TAKE 1 TABLET BY MOUTH TWICE A WEEK  5  . gabapentin (NEURONTIN) 600 MG tablet Take 1 tablet (600 mg total) by mouth 2 (two) times daily. 60 tablet 2  . MedroxyPROGESTERone Acetate 150 MG/ML SUSY Inject 1 mL (150 mg total) into the muscle every 3 (three) months. 1 Syringe 2  . nystatin-triamcinolone (MYCOLOG II) cream Apply 1 application topically 2 (two) times daily. 30 g 1  . omeprazole (PRILOSEC) 40 MG capsule Take 40 mg by mouth daily.    . phentermine (ADIPEX-P) 37.5 MG tablet Take 37.5 mg by mouth daily.  2  . propranolol (INDERAL) 10 MG tablet Take 1 tablet (10 mg total) by mouth 3 (three) times daily as needed. 90 tablet 2  . QUEtiapine (SEROQUEL) 100 MG tablet Take 1 tablet (100 mg total) by mouth at bedtime. 30 tablet 2  . rosuvastatin (CRESTOR) 20 MG tablet Take 20 mg by mouth daily.    Marland Kitchen topiramate (TOPAMAX) 100 MG tablet Take 1.5 tablets (150 mg total) by mouth daily. 45 tablet 2  . tretinoin (RETIN-A) 0.1 % cream APPLY TO AFFECTED AREA AT BEDTIME  2   No current facility-administered medications for this visit.      Musculoskeletal: Strength & Muscle Tone: within normal limits Gait & Station: normal Patient leans: N/A  Psychiatric Specialty  Exam: Review of Systems  Psychiatric/Behavioral: Positive for depression. The patient is nervous/anxious.   All other systems reviewed and are negative.   Blood pressure 112/76, pulse 97, temperature 98.5 F (36.9 C), temperature source Oral, weight 241 lb 12.8 oz (109.7 kg).Body mass index is 37.31 kg/m.  General Appearance: Casual  Eye Contact:  Fair  Speech:  Normal Rate  Volume:  Normal  Mood:  Anxious and Dysphoric  Affect:  Congruent  Thought Process:  Goal Directed and Descriptions of Associations: Intact  Orientation:  Full (Time, Place, and Person)  Thought Content: Logical   Suicidal Thoughts:  No  Homicidal Thoughts:  No  Memory:  Immediate;   Fair Recent;   Fair Remote;   Fair  Judgement:  Fair  Insight:  Fair  Psychomotor Activity:  Normal  Concentration:  Concentration: Fair and Attention Span: Fair  Recall:  [employer] of Knowledge: Fair  Language: Fair  Akathisia:  No  Handed:  Right  AIMS (if indicated): 0  Assets:  Communication Skills Desire for Improvement Housing Talents/Skills Transportation  ADL's:  Intact  Cognition: WNL  Sleep:  Fair   Screenings: AUDIT     Office Visit from 05/22/2016 in [facility]  Alcohol Use Disorder Identification Test Final Score (AUDIT)  0    PHQ2-9     Office Visit from 05/22/2016 in [facility]  PHQ-2 Total Score  4  PHQ-9 Total Score  18       Assessment and Plan: Mary Koch is a 47 year old Caucasian female with history of anxiety,  depression, mood lability.  She recently lost her father to late stage cancer.  She is currently grieving but reports she is coping well.  Discussed to follow-up with Ms. Royal Piedra her therapist for grief counseling.  Provided grief counseling during the session.  We will continue the medications as noted below.  Plan For anxiety Continue Cymbalta 90 mg p.o. daily Continue Neurontin 600 mg p.o. twice daily Continue  propranolol 10 mg p.o. 3 times daily as needed Continue CBT  For depressive symptoms Seroquel 100 mg p.o. nightly Continue Topamax 150 mg p.o. Daily Continue Cymbalta 90 mg p.o. daily Continue CBT  For insomnia Continue Seroquel 100 mg p.o. Nightly  Agoraphobia CBT with Ms. [name]  For bereavement Start grief counseling with Ms. [name]  Caffeine abuse She continues to cut down  Tobacco use disorder Provided smoking cessation counseling, she is not ready to quit.  Provided supportive psychotherapy, grief counseling for 10 minutes.  Follow-up in clinic in 1 month or sooner if needed   Ursula Alert, MD 03/25/2018, 11:56 AM

## 2018-03-25 ENCOUNTER — Encounter: Payer: Self-pay | Admitting: Psychiatry

## 2018-03-28 ENCOUNTER — Other Ambulatory Visit: Payer: Self-pay | Admitting: Psychiatry

## 2018-03-28 DIAGNOSIS — F339 Major depressive disorder, recurrent, unspecified: Secondary | ICD-10-CM

## 2018-03-28 DIAGNOSIS — F410 Panic disorder [episodic paroxysmal anxiety] without agoraphobia: Secondary | ICD-10-CM

## 2018-03-30 MED ORDER — DULOXETINE HCL 60 MG PO CPEP: 60.0000 mg | ORAL_CAPSULE | Freq: Every day | ORAL | 0 refills | Status: DC

## 2018-03-30 MED ORDER — QUETIAPINE FUMARATE 100 MG PO TABS: 100.0000 mg | ORAL_TABLET | Freq: Every day | ORAL | 0 refills | Status: DC

## 2018-03-30 MED ORDER — DULOXETINE HCL 30 MG PO CPEP: 30.0000 mg | ORAL_CAPSULE | Freq: Every day | ORAL | 0 refills | Status: DC

## 2018-03-30 NOTE — Telephone Encounter (Signed)
Sent 90 days supply of duloxetine and seroquel.

## 2018-03-30 NOTE — Telephone Encounter (Signed)
recevied a fax requesting a 90 day supploy on the quetiapine and both duloxetine.  pt was last seen on 03-24-17 next appt  05-21-18.  30 day supply was send in but pharmacy is requesting 90 day supply.

## 2018-04-02 NOTE — Progress Notes (Signed)
  THERAPIST PROGRESS NOTE   Date of Service:   03/11/2018  Session Time:  1 hour  Patient:   Mary Koch   DOB:   [birth year]  MR Number:  [id number]  Location:  [facility] [place] [address] Dept: [phone]            Provider/Observer:  Lubertha South Counselor  Risk of Suicide/Violence: virtually non-existent   Diagnosis:    Panic disorder  Type of Therapy: Individual Therapy  Treatment Goals addressed: Coping  Participation Level: Active   Interventions: CBT and Motivational Interviewing  Behavioral Response: CasualAlertEuthymic   Summary: Therapist met with Patient in an outpatient setting to assess current mood and assist with making progress towards goals through the use of therapeutic intervention. Therapist did a brief mood check, assessing anger, fear, disgust, excitement, happiness, and sadness. Patient reports that her father recently died.  Therapist assisted Patient with understanding stages of grief.     Plan: Mary Koch will use coping skills to manage symptoms   Return again in 2 weeks.

## 2018-04-27 DIAGNOSIS — F5104 Psychophysiologic insomnia: Secondary | ICD-10-CM | POA: Diagnosis not present

## 2018-04-27 DIAGNOSIS — G43119 Migraine with aura, intractable, without status migrainosus: Secondary | ICD-10-CM | POA: Diagnosis not present

## 2018-04-27 DIAGNOSIS — G5603 Carpal tunnel syndrome, bilateral upper limbs: Secondary | ICD-10-CM | POA: Diagnosis not present

## 2018-04-27 DIAGNOSIS — G4719 Other hypersomnia: Secondary | ICD-10-CM | POA: Diagnosis not present

## 2018-05-05 DIAGNOSIS — Z8719 Personal history of other diseases of the digestive system: Secondary | ICD-10-CM | POA: Diagnosis not present

## 2018-05-05 DIAGNOSIS — E8881 Metabolic syndrome: Secondary | ICD-10-CM | POA: Diagnosis not present

## 2018-05-05 DIAGNOSIS — E669 Obesity, unspecified: Secondary | ICD-10-CM | POA: Diagnosis not present

## 2018-05-05 DIAGNOSIS — F329 Major depressive disorder, single episode, unspecified: Secondary | ICD-10-CM | POA: Diagnosis not present

## 2018-05-11 ENCOUNTER — Ambulatory Visit: Payer: Medicare HMO | Admitting: Licensed Clinical Social Worker

## 2018-05-21 ENCOUNTER — Other Ambulatory Visit: Payer: Self-pay

## 2018-05-21 ENCOUNTER — Ambulatory Visit (INDEPENDENT_AMBULATORY_CARE_PROVIDER_SITE_OTHER): Payer: Medicare HMO | Admitting: Psychiatry

## 2018-05-21 ENCOUNTER — Encounter: Payer: Self-pay | Admitting: Psychiatry

## 2018-05-21 VITALS — BP 112/74 | HR 87 | Temp 98.6°F | Wt 235.2 lb

## 2018-05-21 DIAGNOSIS — F410 Panic disorder [episodic paroxysmal anxiety] without agoraphobia: Secondary | ICD-10-CM

## 2018-05-21 DIAGNOSIS — F17200 Nicotine dependence, unspecified, uncomplicated: Secondary | ICD-10-CM

## 2018-05-21 DIAGNOSIS — F1590 Other stimulant use, unspecified, uncomplicated: Secondary | ICD-10-CM | POA: Diagnosis not present

## 2018-05-21 DIAGNOSIS — F5105 Insomnia due to other mental disorder: Secondary | ICD-10-CM | POA: Diagnosis not present

## 2018-05-21 DIAGNOSIS — F339 Major depressive disorder, recurrent, unspecified: Secondary | ICD-10-CM | POA: Diagnosis not present

## 2018-05-21 DIAGNOSIS — F4000 Agoraphobia, unspecified: Secondary | ICD-10-CM | POA: Diagnosis not present

## 2018-05-21 DIAGNOSIS — F99 Mental disorder, not otherwise specified: Secondary | ICD-10-CM | POA: Diagnosis not present

## 2018-05-21 MED ORDER — VARENICLINE TARTRATE 0.5 MG PO TABS: 0.5000 mg | ORAL_TABLET | Freq: Two times a day (BID) | ORAL | 2 refills | Status: DC

## 2018-05-21 MED ORDER — GABAPENTIN 600 MG PO TABS: 600.0000 mg | ORAL_TABLET | Freq: Two times a day (BID) | ORAL | 1 refills | Status: DC

## 2018-05-21 NOTE — Progress Notes (Signed)
[place] MD OP Progress Note  05/21/2018 3:35 PM Mary Koch  MRN:  [id number]  Chief Complaint: ' I am here for follow up ' Chief Complaint    Follow-up; Medication Refill     HPI: Mary Koch is a 47 year old Caucasian female, lives in [place], has a history of depression, anxiety, on SSD, presented to the clinic today for a follow-up visit.  Patient today reports she is coping with her father's loss.  She reports there are days when she feels depressed and cries.  She however has been trying to cope and is doing better.  She continues to be in therapy with Ms. [name].  Patient otherwise reports no changes in her mood symptoms.  She denies any side effects to the medications.  She is compliant with her medications as prescribed.  She reports sleep as fair.  She reports there are some days, 2-3 times a week when she feels her sleep is restless.  She continues to take Seroquel.  She reports she wants to stay on the same dose.  She reports she has been trying to quit smoking.  Discussed Chantix.  Provided her medication education.  She reports she tried it in the past and tolerated it well.  Patient denies any other concerns today. Visit Diagnosis:    ICD-10-CM   1. MDD (major depressive disorder), recurrent episode, with atypical features (Eugenio Saenz) F33.9   2. Panic disorder F41.0   3. Agoraphobia F40.00 gabapentin (NEURONTIN) 600 MG tablet  4. 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 Tobacco use disorder F17.200   6. Insomnia due to other mental disorder F51.05    F99     Past Psychiatric History: Past trials of Paxil, Luvox, Klonopin, Zoloft, Seroquel, Wellbutrin.  I have reviewed past psychiatric history from my progress note on 03/24/2018  Past Medical History:  Past Medical History:  Diagnosis Date  . Allergic rhinitis   . Anemia   . Anxiety   . Anxiety and depression   . Carpal tunnel syndrome   . Depression   . Fibroids   . GERD (gastroesophageal reflux disease)   . Headache(784.0)   .  Headache(784.0)   . Heavy periods   . Hyperthyroidism    right portion of thyroid removed  . Muscle spasm   . Neurological disorder    evaluation for ms  . Painful menstrual periods   . Shortness of breath    when i smoke a lot    Past Surgical History:  Procedure Laterality Date  . BREAST BIOPSY Left    neg  . carpel tunnel Left 2017  . CESAREAN SECTION     times 2  . CHOLECYSTECTOMY    . THYROID LOBECTOMY      Family Psychiatric History: I have reviewed family psychiatric history from my progress note on 03/24/2018.  Family History:  Family History  Problem Relation Age of Onset  . Osteoporosis Mother   . Diabetes Father   . Anxiety disorder Father   . Depression Father   . Post-traumatic stress disorder Father   . Heart disease Father   . Anxiety disorder Sister   . Alcohol abuse Brother   . Breast cancer Neg Hx   . Colon cancer Neg Hx     Social History: Reviewed social history from my progress note on 03/24/2018. Social History   Socioeconomic History  . Marital status: Legally Separated    Spouse name: Not on file  . Number of children: Not on file  .  Years of education: Not on file  . Highest education level: Not on file  Occupational History  . Not on file  Social Needs  . Financial resource strain: Not on file  . Food insecurity:    Worry: Not on file    Inability: Not on file  . Transportation needs:    Medical: Not on file    Non-medical: Not on file  Tobacco Use  . Smoking status: Current Every Day Smoker    Packs/day: 1.50    Years: 25.00    Pack years: 37.50    Types: Cigarettes    Start date: 05/22/1986  . Smokeless tobacco: Never Used  Substance and Sexual Activity  . Alcohol use: No    Alcohol/week: 0.0 oz  . Drug use: No  . Sexual activity: Yes    Partners: Male    Birth control/protection: Surgical  Lifestyle  . Physical activity:    Days per week: Not on file    Minutes per session: Not on file  . Stress: Not on file   Relationships  . Social connections:    Talks on phone: Not on file    Gets together: Not on file    Attends religious service: Not on file    Active member of club or organization: Not on file    Attends meetings of clubs or organizations: Not on file    Relationship status: Not on file  Other Topics Concern  . Not on file  Social History Narrative  . Not on file    Allergies:  Allergies  Allergen Reactions  . Azithromycin Other (See Comments)    headache  . Codeine Other (See Comments)    unknown  . Penicillins Other (See Comments)    headaches    Metabolic Disorder Labs: No results found for: HGBA1C, MPG No results found for: PROLACTIN No results found for: CHOL, TRIG, HDL, CHOLHDL, VLDL, LDLCALC No results found for: TSH  Therapeutic Level Labs: No results found for: LITHIUM No results found for: VALPROATE No components found for:  CBMZ  Current Medications: Current Outpatient Medications  Medication Sig Dispense Refill  . albuterol (PROVENTIL HFA;VENTOLIN HFA) 108 (90 Base) MCG/ACT inhaler INHALE 1 TO 2 PUFFS DAILY    . BIOTIN PO Take by mouth.    Marland Kitchen BLACK ELDERBERRY,BERRY-FLOWER, PO Take by mouth.    . Cyanocobalamin (VITAMIN B 12 PO) Take by mouth.    . cyclobenzaprine (FLEXERIL) 10 MG tablet Take 10 mg by mouth 3 (three) times daily as needed for muscle spasms.    . DULoxetine (CYMBALTA) 30 MG capsule Take 1 capsule (30 mg total) by mouth daily. Take it along with 60 mg daily to make it 90 mg 90 capsule 0  . DULoxetine (CYMBALTA) 60 MG capsule Take 1 capsule (60 mg total) by mouth daily. To be taken with 30 mg to make it 90 mg 90 capsule 0  . fluticasone (FLONASE) 50 MCG/ACT nasal spray Place 1 spray into both nostrils daily.  4  . Fluticasone Furoate-Vilanterol (BREO ELLIPTA) 100-25 MCG/INH AEPB INHALE 1 PUFF BY MOUTH ONCE DAILY    . furosemide (LASIX) 20 MG tablet TAKE 1 TABLET BY MOUTH TWICE A WEEK  5  . gabapentin (NEURONTIN) 600 MG tablet Take 1 tablet  (600 mg total) by mouth 2 (two) times daily. 180 tablet 1  . MedroxyPROGESTERone Acetate 150 MG/ML SUSY Inject 1 mL (150 mg total) into the muscle every 3 (three) months. 1 Syringe 2  . nystatin-triamcinolone (  MYCOLOG II) cream Apply 1 application topically 2 (two) times daily. 30 g 1  . omeprazole (PRILOSEC) 40 MG capsule Take 40 mg by mouth daily.    . phentermine (ADIPEX-P) 37.5 MG tablet Take 37.5 mg by mouth daily.  2  . propranolol (INDERAL) 10 MG tablet Take 1 tablet (10 mg total) by mouth 3 (three) times daily as needed. 90 tablet 2  . QUEtiapine (SEROQUEL) 100 MG tablet Take 1 tablet (100 mg total) by mouth at bedtime. 90 tablet 0  . rosuvastatin (CRESTOR) 20 MG tablet Take 20 mg by mouth daily.    Marland Kitchen topiramate (TOPAMAX) 100 MG tablet Take 1.5 tablets (150 mg total) by mouth daily. 45 tablet 2  . topiramate (TOPAMAX) 100 MG tablet Take 1.5 tablets (150 mg total) by mouth daily. 45 tablet 3  . tretinoin (RETIN-A) 0.1 % cream APPLY TO AFFECTED AREA AT BEDTIME  2  . varenicline (CHANTIX) 0.5 MG tablet Take 1 tablet (0.5 mg total) by mouth 2 (two) times daily. 60 tablet 2   No current facility-administered medications for this visit.      Musculoskeletal: Strength & Muscle Tone: within normal limits Gait & Station: normal Patient leans: N/A  Psychiatric Specialty Exam: Review of Systems  Psychiatric/Behavioral: Positive for depression. The patient is nervous/anxious.   All other systems reviewed and are negative.   Blood pressure 112/74, pulse 87, temperature 98.6 F (37 C), temperature source Oral, weight 235 lb 3.2 oz (106.7 kg).Body mass index is 36.29 kg/m.  General Appearance: Casual  Eye Contact:  Fair  Speech:  Clear and Coherent  Volume:  Normal  Mood:  Anxious  Affect:  Congruent  Thought Process:  Goal Directed and Descriptions of Associations: Intact  Orientation:  Full (Time, Place, and Person)  Thought Content: Logical   Suicidal Thoughts:  No  Homicidal  Thoughts:  No  Memory:  Immediate;   Fair Recent;   Fair Remote;   Fair  Judgement:  Fair  Insight:  Fair  Psychomotor Activity:  Normal  Concentration:  Concentration: Fair and Attention Span: Fair  Recall:  [employer] of Knowledge: Fair  Language: Fair  Akathisia:  No  Handed:  Right  AIMS (if indicated): 0  Assets:  Communication Skills Desire for Improvement Social Support  ADL's:  Intact  Cognition: WNL  Sleep:  Fair   Screenings: AUDIT     Office Visit from 05/22/2016 in [place]  Alcohol Use Disorder Identification Test Final Score (AUDIT)  0    PHQ2-9     Office Visit from 05/22/2016 in [place]  PHQ-2 Total Score  4  PHQ-9 Total Score  18       Assessment and Plan: Shanise is a 47 year old Caucasian female with history of anxiety, depression, mood lability.  Patient recently lost her father to late stage cancer.  Patient reports she is coping well with the same.  She continues to smoke cigarettes and wants help with the same.  Discussed medication with patient as noted below.  She will continue grief counseling with Ms. Royal Piedra.  Plan For anxiety Continue Cymbalta 90 mg p.o. daily. Continue Neurontin 600 mg p.o. twice daily. Continue propranolol 10 mg p.o. 3 times daily as needed Continue CBT  For depressive symptoms. Seroquel 100 mg p.o. nightly Continue Topamax 150 mg p.o. daily Continue Cymbalta 90 mg p.o. daily Continue CBT  For insomnia Continue Seroquel 100 mg p.o. nightly  Agoraphobia CBT with Ms. [employer]  For bereavement Continue grief counseling with Ms. [name]  Tobacco use disorder Provided smoking cessation counseling Start Chantix 0.5 mg p.o. twice daily  Caffeine abuse She continues to cut down  Follow-up in clinic in 2 months or sooner if needed.  More than 50 % of the time was spent for psychoeducation and supportive psychotherapy and care  coordination.  This note was generated in part or whole with voice recognition software. Voice recognition is usually quite accurate but there are transcription errors that can and very often do occur. I apologize for any typographical errors that were not detected and corrected.         Ursula Alert, MD 05/22/2018, 12:04 PM

## 2018-05-21 NOTE — Patient Instructions (Signed)
Varenicline oral tablets What is this medicine? VARENICLINE (var EN i kleen) is used to help people quit smoking. It can reduce the symptoms caused by stopping smoking. It is used with a patient support program recommended by your physician. This medicine may be used for other purposes; ask your health care provider or pharmacist if you have questions. COMMON BRAND NAME(S): Chantix What should I tell my health care provider before I take this medicine? They need to know if you have any of these conditions: -bipolar disorder, depression, schizophrenia or other mental illness -heart disease -if you often drink alcohol -kidney disease -peripheral vascular disease -seizures -stroke -suicidal thoughts, plans, or attempt; a previous suicide attempt by you or a family member -an unusual or allergic reaction to varenicline, other medicines, foods, dyes, or preservatives -pregnant or trying to get pregnant -breast-feeding How should I use this medicine? Take this medicine by mouth after eating. Take with a full glass of water. Follow the directions on the prescription label. Take your doses at regular intervals. Do not take your medicine more often than directed. There are 3 ways you can use this medicine to help you quit smoking; talk to your health care professional to decide which plan is right for you: 1) you can choose a quit date and start this medicine 1 week before the quit date, or, 2) you can start taking this medicine before you choose a quit date, and then pick a quit date between day 8 and 35 days of treatment, or, 3) if you are not sure that you are able or willing to quit smoking right away, start taking this medicine and slowly decrease the amount you smoke as directed by your health care professional with the goal of being cigarette-free by week 12 of treatment. Stick to your plan; ask about support groups or other ways to help you remain cigarette-free. If you are motivated to quit  smoking and did not succeed during a previous attempt with this medicine for reasons other than side effects, or if you returned to smoking after this treatment, speak with your health care professional about whether another course of this medicine may be right for you. A special MedGuide will be given to you by the pharmacist with each prescription and refill. Be sure to read this information carefully each time. Talk to your pediatrician regarding the use of this medicine in children. This medicine is not approved for use in children. Overdosage: If you think you have taken too much of this medicine contact a poison control center or emergency room at once. NOTE: This medicine is only for you. Do not share this medicine with others. What if I miss a dose? If you miss a dose, take it as soon as you can. If it is almost time for your next dose, take only that dose. Do not take double or extra doses. What may interact with this medicine? -alcohol or any product that contains alcohol -insulin -other stop smoking aids -theophylline -warfarin This list may not describe all possible interactions. Give your health care provider a list of all the medicines, herbs, non-prescription drugs, or dietary supplements you use. Also tell them if you smoke, drink alcohol, or use illegal drugs. Some items may interact with your medicine. What should I watch for while using this medicine? Visit your doctor or health care professional for regular check ups. Ask for ongoing advice and encouragement from your doctor or healthcare professional, friends, and family to help you quit. If   you smoke while on this medication, quit again Your mouth may get dry. Chewing sugarless gum or sucking hard candy, and drinking plenty of water may help. Contact your doctor if the problem does not go away or is severe. You may get drowsy or dizzy. Do not drive, use machinery, or do anything that needs mental alertness until you know how  this medicine affects you. Do not stand or sit up quickly, especially if you are an older patient. This reduces the risk of dizzy or fainting spells. Sleepwalking can happen during treatment with this medicine, and can sometimes lead to behavior that is harmful to you, other people, or property. Stop taking this medicine and tell your doctor if you start sleepwalking or have other unusual sleep-related activity. Decrease the amount of alcoholic beverages that you drink during treatment with this medicine until you know if this medicine affects your ability to tolerate alcohol. Some people have experienced increased drunkenness (intoxication), unusual or sometimes aggressive behavior, or no memory of things that have happened (amnesia) during treatment with this medicine. The use of this medicine may increase the chance of suicidal thoughts or actions. Pay special attention to how you are responding while on this medicine. Any worsening of mood, or thoughts of suicide or dying should be reported to your health care professional right away. What side effects may I notice from receiving this medicine? Side effects that you should report to your doctor or health care professional as soon as possible: -allergic reactions like skin rash, itching or hives, swelling of the face, lips, tongue, or throat -acting aggressive, being angry or violent, or acting on dangerous impulses -breathing problems -changes in vision -chest pain or chest tightness -confusion, trouble speaking or understanding -new or worsening depression, anxiety, or panic attacks -extreme increase in activity and talking (mania) -fast, irregular heartbeat -feeling faint or lightheaded, falls -fever -pain in legs when walking -problems with balance, talking, walking -redness, blistering, peeling or loosening of the skin, including inside the mouth -ringing in ears -seeing or hearing things that aren't there  (hallucinations) -seizures -sleepwalking -sudden numbness or weakness of the face, arm or leg -thoughts about suicide or dying, or attempts to commit suicide -trouble passing urine or change in the amount of urine -unusual bleeding or bruising -unusually weak or tired Side effects that usually do not require medical attention (report to your doctor or health care professional if they continue or are bothersome): -constipation -headache -nausea, vomiting -strange dreams -stomach gas -trouble sleeping This list may not describe all possible side effects. Call your doctor for medical advice about side effects. You may report side effects to FDA at 1-800-FDA-1088. Where should I keep my medicine? Keep out of the reach of children. Store at room temperature between 15 and 30 degrees C (59 and 86 degrees F). Throw away any unused medicine after the expiration date. NOTE: This sheet is a summary. It may not cover all possible information. If you have questions about this medicine, talk to your doctor, pharmacist, or health care provider.  2018 Elsevier/Gold Standard (2015-08-31 16:14:23)  

## 2018-05-22 ENCOUNTER — Encounter: Payer: Self-pay | Admitting: Psychiatry

## 2018-05-22 ENCOUNTER — Ambulatory Visit: Payer: Self-pay

## 2018-05-26 ENCOUNTER — Ambulatory Visit (INDEPENDENT_AMBULATORY_CARE_PROVIDER_SITE_OTHER): Payer: Medicare HMO | Admitting: Obstetrics and Gynecology

## 2018-05-26 VITALS — BP 103/64 | HR 95 | Wt 230.9 lb

## 2018-05-26 DIAGNOSIS — Z3042 Encounter for surveillance of injectable contraceptive: Secondary | ICD-10-CM | POA: Diagnosis not present

## 2018-05-26 MED ORDER — MEDROXYPROGESTERONE ACETATE 150 MG/ML IM SUSP
150.0000 mg | Freq: Once | INTRAMUSCULAR | Status: AC
  Administered 2018-05-26: 150 mg via INTRAMUSCULAR

## 2018-05-26 NOTE — Progress Notes (Signed)
Date last pap: .na Last Depo-Provera: .03/06/18  Side Effects if any: .na Serum HCG indicated? .na Depo-Provera 150 mg IM given by: .Keturah Barre, CMA Next appointment due .Aug 13- Aug 27th BP 103/64   Pulse 95   Wt 230 lb 14.4 oz (104.7 kg)   BMI 35.63 kg/m

## 2018-06-10 ENCOUNTER — Ambulatory Visit (INDEPENDENT_AMBULATORY_CARE_PROVIDER_SITE_OTHER): Payer: Medicare HMO | Admitting: Licensed Clinical Social Worker

## 2018-06-10 DIAGNOSIS — F339 Major depressive disorder, recurrent, unspecified: Secondary | ICD-10-CM | POA: Diagnosis not present

## 2018-06-24 ENCOUNTER — Other Ambulatory Visit: Payer: Self-pay | Admitting: Psychiatry

## 2018-06-24 DIAGNOSIS — F339 Major depressive disorder, recurrent, unspecified: Secondary | ICD-10-CM

## 2018-06-24 DIAGNOSIS — F410 Panic disorder [episodic paroxysmal anxiety] without agoraphobia: Secondary | ICD-10-CM

## 2018-06-29 ENCOUNTER — Other Ambulatory Visit: Payer: Self-pay | Admitting: Psychiatry

## 2018-06-29 DIAGNOSIS — F339 Major depressive disorder, recurrent, unspecified: Secondary | ICD-10-CM

## 2018-07-04 NOTE — Progress Notes (Signed)
   THERAPIST PROGRESS NOTE  Session Time:64min  Participation Level: Active  Behavioral Response: CasualAlertEuthymic  Type of Therapy: Individual Therapy  Treatment Goals addressed: Coping  Interventions: CBT and Motivational Interviewing  Summary: Mary Koch is a 47 y.o. female who presents with contniued symptoms of her diagnosis.  Patient reports a favorable mood on most days.  She reprots struggles with her sestranged husband.  She reprots she neds to learn to be more assertive in her thoughts and feelings toward him.  Role played how to be assertive. LCSW offered education about common irrational fears and beliefs that contribute to anxiety. Reviewed and showed client how to use CBT/REBT strategies to recognize and re-frame irrational fears and self-talk as a means of increasing client's capacity to handle their anxiety more constructively.   Suicidal/Homicidal: No   Plan: Return again in 2weeks.  Diagnosis: Axis I: Panic Disorder    Axis II: No diagnosis    Lubertha South, LCSW 06/10/2018

## 2018-07-08 DIAGNOSIS — E669 Obesity, unspecified: Secondary | ICD-10-CM | POA: Diagnosis not present

## 2018-07-08 DIAGNOSIS — F419 Anxiety disorder, unspecified: Secondary | ICD-10-CM | POA: Diagnosis not present

## 2018-07-08 DIAGNOSIS — R12 Heartburn: Secondary | ICD-10-CM | POA: Diagnosis not present

## 2018-07-08 DIAGNOSIS — E8881 Metabolic syndrome: Secondary | ICD-10-CM | POA: Diagnosis not present

## 2018-07-20 ENCOUNTER — Ambulatory Visit (INDEPENDENT_AMBULATORY_CARE_PROVIDER_SITE_OTHER): Payer: Medicare HMO | Admitting: Licensed Clinical Social Worker

## 2018-07-20 DIAGNOSIS — F339 Major depressive disorder, recurrent, unspecified: Secondary | ICD-10-CM

## 2018-07-21 ENCOUNTER — Ambulatory Visit: Payer: Medicare HMO | Admitting: Psychiatry

## 2018-07-23 NOTE — Progress Notes (Signed)
   THERAPIST PROGRESS NOTE  Session Time: 1hr  Participation Level: Active  Behavioral Response: CasualAlertIrritable  Type of Therapy: Individual Therapy  Treatment Goals addressed: Coping  Interventions: CBT and Motivational Interviewing  Summary: Randilyn Foisy is a 47 y.o. female who presents with a reduction in symptoms.  Therapist met with Patient in an outpatient setting to assess current mood and assist with making progress towards goals through the use of therapeutic intervention. Therapist did a brief mood check, assessing anger, fear, disgust, excitement, happiness, and sadness.  Therapist facilitated a discussion on Coping with Stress.  Therapist explained how stress is a common problem with everyone but learning how to cope with it can determine outcome.  Therapist assisted Patient with learning her signs of stress.  Therapist assigned homework assignment on Identifying Life Events, Daily Hassles and Strategies for Preventing Stress.   Suicidal/Homicidal: No  Plan: Return again in 2 weeks.  Diagnosis: Axis I: Depression    Axis II: No diagnosis    Lubertha South, LCSW 07/21/2018

## 2018-08-03 DIAGNOSIS — K210 Gastro-esophageal reflux disease with esophagitis: Secondary | ICD-10-CM | POA: Diagnosis not present

## 2018-08-03 DIAGNOSIS — F419 Anxiety disorder, unspecified: Secondary | ICD-10-CM | POA: Diagnosis not present

## 2018-08-03 DIAGNOSIS — Z0000 Encounter for general adult medical examination without abnormal findings: Secondary | ICD-10-CM | POA: Diagnosis not present

## 2018-08-03 DIAGNOSIS — E8881 Metabolic syndrome: Secondary | ICD-10-CM | POA: Diagnosis not present

## 2018-08-03 DIAGNOSIS — E669 Obesity, unspecified: Secondary | ICD-10-CM | POA: Diagnosis not present

## 2018-08-04 ENCOUNTER — Encounter: Payer: Self-pay | Admitting: Psychiatry

## 2018-08-04 ENCOUNTER — Ambulatory Visit (INDEPENDENT_AMBULATORY_CARE_PROVIDER_SITE_OTHER): Payer: Medicare HMO | Admitting: Psychiatry

## 2018-08-04 ENCOUNTER — Other Ambulatory Visit: Payer: Self-pay

## 2018-08-04 VITALS — BP 111/77 | HR 92 | Temp 98.6°F | Wt 233.4 lb

## 2018-08-04 DIAGNOSIS — R5381 Other malaise: Secondary | ICD-10-CM | POA: Diagnosis not present

## 2018-08-04 DIAGNOSIS — E034 Atrophy of thyroid (acquired): Secondary | ICD-10-CM | POA: Diagnosis not present

## 2018-08-04 DIAGNOSIS — E7849 Other hyperlipidemia: Secondary | ICD-10-CM | POA: Diagnosis not present

## 2018-08-04 DIAGNOSIS — F17200 Nicotine dependence, unspecified, uncomplicated: Secondary | ICD-10-CM

## 2018-08-04 DIAGNOSIS — I10 Essential (primary) hypertension: Secondary | ICD-10-CM | POA: Diagnosis not present

## 2018-08-04 DIAGNOSIS — F5105 Insomnia due to other mental disorder: Secondary | ICD-10-CM

## 2018-08-04 DIAGNOSIS — F99 Mental disorder, not otherwise specified: Secondary | ICD-10-CM

## 2018-08-04 DIAGNOSIS — F410 Panic disorder [episodic paroxysmal anxiety] without agoraphobia: Secondary | ICD-10-CM | POA: Diagnosis not present

## 2018-08-04 DIAGNOSIS — F339 Major depressive disorder, recurrent, unspecified: Secondary | ICD-10-CM

## 2018-08-04 DIAGNOSIS — F1590 Other stimulant use, unspecified, uncomplicated: Secondary | ICD-10-CM | POA: Diagnosis not present

## 2018-08-04 DIAGNOSIS — M05021 Felty's syndrome, right elbow: Secondary | ICD-10-CM | POA: Diagnosis not present

## 2018-08-04 DIAGNOSIS — F4000 Agoraphobia, unspecified: Secondary | ICD-10-CM | POA: Diagnosis not present

## 2018-08-04 DIAGNOSIS — M05022 Felty's syndrome, left elbow: Secondary | ICD-10-CM | POA: Diagnosis not present

## 2018-08-04 MED ORDER — DULOXETINE HCL 60 MG PO CPEP: 60.0000 mg | ORAL_CAPSULE | Freq: Two times a day (BID) | ORAL | 0 refills | Status: DC

## 2018-08-04 MED ORDER — VARENICLINE TARTRATE 1 MG PO TABS: 1.0000 mg | ORAL_TABLET | Freq: Two times a day (BID) | ORAL | 2 refills | Status: DC

## 2018-08-04 NOTE — Progress Notes (Signed)
[place] MD Progress Note  08/04/2018 2:40 PM Mary Koch  MRN:  [id number]  Chief Complaint: ' I am here for follow up." Chief Complaint    Follow-up; Medication Refill     HPI: Mary Koch is a 47 year old Caucasian female, lives in [place], has a history of depression, anxiety, on SSD, presented to the clinic today for a follow-up visit.  Patient today reports she continues to struggle with the loss of her father.  Patient reports there are days when she misses him so much that she cries.  She continues to talk to Ms. [name] which has been helpful.  Patient reports some on and off depressive symptoms and crying spells.  She also reports some reduced appetite on and off.  She also reports financial stressors which is making her more depressed.  Patient reports she has been compliant with her medications.   Patient continues to smoke cigarettes however reports Chantix as helpful since has cut down a lot .  She would like to increase the dosage to see if that will help her more.  She denies any suicidality.  Patient denies any perceptual disturbances.  Patient denies any homicidality. Visit Diagnosis:    ICD-10-CM   1. MDD (major depressive disorder), recurrent episode, with atypical features ([place]) F33.9   2. Panic disorder F41.0 DULoxetine (CYMBALTA) 60 MG capsule  3. Tobacco use disorder F17.200 varenicline (CHANTIX) 1 MG tablet  4. Agoraphobia F40.00   5. 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 Insomnia due to other mental disorder F51.05    F99     Past Psychiatric History: I have reviewed past psychiatric history from my progress note on 03/24/2018.  Past trials of Paxil, Luvox, Klonopin, Zoloft, Seroquel, Wellbutrin.  Past Medical History:  Past Medical History:  Diagnosis Date  . Allergic rhinitis   . Anemia   . Anxiety   . Anxiety and depression   . Carpal tunnel syndrome   . Depression   . Fibroids   . GERD (gastroesophageal reflux disease)   . Headache(784.0)   .  Headache(784.0)   . Heavy periods   . Hyperthyroidism    right portion of thyroid removed  . Muscle spasm   . Neurological disorder    evaluation for ms  . Painful menstrual periods   . Shortness of breath    when i smoke a lot    Past Surgical History:  Procedure Laterality Date  . BREAST BIOPSY Left    neg  . carpel tunnel Left 2017  . CESAREAN SECTION     times 2  . CHOLECYSTECTOMY    . THYROID LOBECTOMY      Family Psychiatric History: I have reviewed family psychiatric history from my progress note on 03/24/2018.  Family History:  Family History  Problem Relation Age of Onset  . Osteoporosis Mother   . Diabetes Father   . Anxiety disorder Father   . Depression Father   . Post-traumatic stress disorder Father   . Heart disease Father   . Anxiety disorder Sister   . Alcohol abuse Brother   . Breast cancer Neg Hx   . Colon cancer Neg Hx     Social History: I have reviewed social history from my progress note on 03/24/2018. Social History   Socioeconomic History  . Marital status: Legally Separated    Spouse name: Not on file  . Number of children: Not on file  . Years of education: Not on file  . Highest education level: Not  on file  Occupational History  . Not on file  Social Needs  . Financial resource strain: Not on file  . Food insecurity:    Worry: Not on file    Inability: Not on file  . Transportation needs:    Medical: Not on file    Non-medical: Not on file  Tobacco Use  . Smoking status: Current Every Day Smoker    Packs/day: 1.50    Years: 25.00    Pack years: 37.50    Types: Cigarettes    Start date: 05/22/1986  . Smokeless tobacco: Never Used  Substance and Sexual Activity  . Alcohol use: No    Alcohol/week: 0.0 oz  . Drug use: No  . Sexual activity: Yes    Partners: Male    Birth control/protection: Surgical  Lifestyle  . Physical activity:    Days per week: Not on file    Minutes per session: Not on file  . Stress: Not on file   Relationships  . Social connections:    Talks on phone: Not on file    Gets together: Not on file    Attends religious service: Not on file    Active member of club or organization: Not on file    Attends meetings of clubs or organizations: Not on file    Relationship status: Not on file  Other Topics Concern  . Not on file  Social History Narrative  . Not on file    Allergies:  Allergies  Allergen Reactions  . Azithromycin Other (See Comments)    headache  . Codeine Other (See Comments)    unknown  . Penicillins Other (See Comments)    headaches    Metabolic Disorder Labs: No results found for: HGBA1C, MPG No results found for: PROLACTIN No results found for: CHOL, TRIG, HDL, CHOLHDL, VLDL, LDLCALC No results found for: TSH  Therapeutic Level Labs: No results found for: LITHIUM No results found for: VALPROATE No components found for:  CBMZ  Current Medications: Current Outpatient Medications  Medication Sig Dispense Refill  . albuterol (PROVENTIL HFA;VENTOLIN HFA) 108 (90 Base) MCG/ACT inhaler INHALE 1 TO 2 PUFFS DAILY    . BIOTIN PO Take by mouth.    Marland Kitchen BLACK ELDERBERRY,BERRY-FLOWER, PO Take by mouth.    . Cyanocobalamin (VITAMIN B 12 PO) Take by mouth.    . cyclobenzaprine (FLEXERIL) 10 MG tablet Take 10 mg by mouth 3 (three) times daily as needed for muscle spasms.    . DULoxetine (CYMBALTA) 60 MG capsule Take 1 capsule (60 mg total) by mouth 2 (two) times daily. 180 capsule 0  . fluticasone (FLONASE) 50 MCG/ACT nasal spray Place 1 spray into both nostrils daily.  4  . Fluticasone Furoate-Vilanterol (BREO ELLIPTA) 100-25 MCG/INH AEPB INHALE 1 PUFF BY MOUTH ONCE DAILY    . furosemide (LASIX) 20 MG tablet TAKE 1 TABLET BY MOUTH TWICE A WEEK  5  . gabapentin (NEURONTIN) 600 MG tablet Take 1 tablet (600 mg total) by mouth 2 (two) times daily. 180 tablet 1  . MedroxyPROGESTERone Acetate 150 MG/ML SUSY Inject 1 mL (150 mg total) into the muscle every 3 (three) months.  1 Syringe 2  . nystatin-triamcinolone (MYCOLOG II) cream Apply 1 application topically 2 (two) times daily. 30 g 1  . omeprazole (PRILOSEC) 40 MG capsule Take 40 mg by mouth daily.    . phentermine (ADIPEX-P) 37.5 MG tablet Take 37.5 mg by mouth daily.  2  . propranolol (INDERAL) 10 MG tablet  TAKE 1 TABLET BY MOUTH THREE TIMES A DAY AS NEEDED 270 tablet 0  . QUEtiapine (SEROQUEL) 100 MG tablet TAKE 1 TABLET BY MOUTH EVERYDAY AT BEDTIME 90 tablet 0  . rosuvastatin (CRESTOR) 20 MG tablet Take 20 mg by mouth daily.    Marland Kitchen topiramate (TOPAMAX) 100 MG tablet Take 1.5 tablets (150 mg total) by mouth daily. 45 tablet 2  . topiramate (TOPAMAX) 100 MG tablet TAKE 1.5 TABLETS (150 MG TOTAL) BY MOUTH DAILY. 135 tablet 1  . tretinoin (RETIN-A) 0.1 % cream APPLY TO AFFECTED AREA AT BEDTIME  2  . varenicline (CHANTIX) 1 MG tablet Take 1 tablet (1 mg total) by mouth 2 (two) times daily. 60 tablet 2   No current facility-administered medications for this visit.      Musculoskeletal: Strength & Muscle Tone: within normal limits Gait & Station: normal Patient leans: N/A  Psychiatric Specialty Exam: Review of Systems  Psychiatric/Behavioral: Positive for depression. The patient is nervous/anxious.   All other systems reviewed and are negative.   Blood pressure 111/77, pulse 92, temperature 98.6 F (37 C), temperature source Oral, weight 233 lb 6.4 oz (105.9 kg).Body mass index is 36.02 kg/m.  General Appearance: Casual  Eye Contact:  Fair  Speech:  Normal Rate  Volume:  Normal  Mood:  Anxious and Dysphoric  Affect:  Congruent  Thought Process:  Goal Directed and Descriptions of Associations: Intact  Orientation:  Full (Time, Place, and Person)  Thought Content: Logical   Suicidal Thoughts:  No  Homicidal Thoughts:  No  Memory:  Immediate;   Fair Recent;   Fair Remote;   Fair  Judgement:  Fair  Insight:  Fair  Psychomotor Activity:  Normal  Concentration:  Concentration: Fair and Attention  Span: Fair  Recall:  [employer] of Knowledge: Fair  Language: Fair  Akathisia:  No  Handed:  Right  AIMS (if indicated): denies tremors, rigidity, stiffness  Assets:  Communication Skills Desire for Improvement Social Support  ADL's:  Intact  Cognition: WNL  Sleep:  Fair   Screenings: AUDIT     Office Visit from 05/22/2016 in [facility]  Alcohol Use Disorder Identification Test Final Score (AUDIT)  0    PHQ2-9     Office Visit from 05/22/2016 in [facility]  PHQ-2 Total Score  4  PHQ-9 Total Score  18       Assessment and Plan: Caroll is a 46 year old Caucasian female with history of anxiety, depression, presented to the clinic today for a follow-up visit.  She continues to cope with the loss of her father who passed away from late stage cancer recently.  She continues to be in grief counseling with Ms. [name].  She does report depressive symptoms and several psychosocial stressors.  Will make the following medication readjustment.  Plan For anxiety Increase Cymbalta to 60 mg p.o. twice daily. Continue Neurontin 600 mg p.o. twice daily Continue propranolol 10 mg p.o. 3 times daily as needed Continue CBT  For depressive symptoms Seroquel 100 mg p.o. Nightly. Continue Topamax 150 mg p.o. daily Increase Cymbalta to 60 mg p.o. twice daily.  For insomnia Continue Seroquel 100 mg p.o. nightly  Agoraphobia CBT with Ms. [name].  For bereavement Continue grief counseling with Ms. [name]  For tobacco use disorder Increase Chantix to 1 mg p.o. twice daily.  Caffeine abuse She continues to cut down  Follow-up in clinic in 1 month.  More than 50 % of the time was  spent for psychoeducation and supportive psychotherapy and care coordination.  This note was generated in part or whole with voice recognition software. Voice recognition is usually quite accurate but there are transcription errors that can and very  often do occur. I apologize for any typographical errors that were not detected and corrected.         Ursula Alert, MD 08/04/2018, 2:40 PM

## 2018-08-11 ENCOUNTER — Ambulatory Visit (INDEPENDENT_AMBULATORY_CARE_PROVIDER_SITE_OTHER): Payer: Medicare HMO | Admitting: Certified Nurse Midwife

## 2018-08-11 ENCOUNTER — Other Ambulatory Visit: Payer: Self-pay | Admitting: Obstetrics and Gynecology

## 2018-08-11 VITALS — BP 104/67 | HR 88 | Ht 67.5 in | Wt 236.2 lb

## 2018-08-11 DIAGNOSIS — Z8719 Personal history of other diseases of the digestive system: Secondary | ICD-10-CM | POA: Diagnosis not present

## 2018-08-11 DIAGNOSIS — F329 Major depressive disorder, single episode, unspecified: Secondary | ICD-10-CM | POA: Diagnosis not present

## 2018-08-11 DIAGNOSIS — Z3042 Encounter for surveillance of injectable contraceptive: Secondary | ICD-10-CM

## 2018-08-11 DIAGNOSIS — R0602 Shortness of breath: Secondary | ICD-10-CM | POA: Diagnosis not present

## 2018-08-11 DIAGNOSIS — F419 Anxiety disorder, unspecified: Secondary | ICD-10-CM | POA: Diagnosis not present

## 2018-08-11 DIAGNOSIS — N921 Excessive and frequent menstruation with irregular cycle: Secondary | ICD-10-CM

## 2018-08-11 MED ORDER — MEDROXYPROGESTERONE ACETATE 150 MG/ML IM SUSY: 150.0000 mg | PREFILLED_SYRINGE | INTRAMUSCULAR | 2 refills | Status: DC

## 2018-08-12 MED ORDER — MEDROXYPROGESTERONE ACETATE 150 MG/ML IM SUSP
150.0000 mg | Freq: Once | INTRAMUSCULAR | Status: AC
  Administered 2018-08-11: 150 mg via INTRAMUSCULAR

## 2018-08-12 NOTE — Progress Notes (Signed)
Last depo inj: 05/26/18  UPT:N/A Side effects:none Next Depo- Provera injection due: 10/29-11/12/19 Annual exam due:10/30/18

## 2018-08-17 NOTE — Progress Notes (Signed)
I have reviewed the record and concur with patient management and plan of care.    Willeen Novak Michelle Jamese Trauger, CNM [facility] 

## 2018-08-24 ENCOUNTER — Ambulatory Visit: Payer: 59 | Admitting: Licensed Clinical Social Worker

## 2018-09-14 ENCOUNTER — Other Ambulatory Visit: Payer: Self-pay | Admitting: Cardiology

## 2018-09-14 ENCOUNTER — Ambulatory Visit
Admission: RE | Admit: 2018-09-14 | Discharge: 2018-09-14 | Disposition: A | Discharge status: Home / Self Care | Payer: Medicare HMO | Source: Ambulatory Visit | Attending: Cardiology | Admitting: Cardiology

## 2018-09-14 ENCOUNTER — Other Ambulatory Visit: Admit: 2018-09-14 | Payer: Medicare HMO

## 2018-09-14 DIAGNOSIS — M25521 Pain in right elbow: Secondary | ICD-10-CM

## 2018-09-14 DIAGNOSIS — M7711 Lateral epicondylitis, right elbow: Secondary | ICD-10-CM | POA: Diagnosis not present

## 2018-09-14 DIAGNOSIS — M25522 Pain in left elbow: Secondary | ICD-10-CM | POA: Diagnosis not present

## 2018-09-15 ENCOUNTER — Ambulatory Visit: Payer: Medicare HMO | Admitting: Psychiatry

## 2018-09-15 ENCOUNTER — Ambulatory Visit (INDEPENDENT_AMBULATORY_CARE_PROVIDER_SITE_OTHER): Payer: Medicare HMO | Admitting: Licensed Clinical Social Worker

## 2018-09-15 DIAGNOSIS — F339 Major depressive disorder, recurrent, unspecified: Secondary | ICD-10-CM

## 2018-09-15 DIAGNOSIS — F419 Anxiety disorder, unspecified: Secondary | ICD-10-CM | POA: Diagnosis not present

## 2018-09-15 DIAGNOSIS — R0602 Shortness of breath: Secondary | ICD-10-CM | POA: Diagnosis not present

## 2018-09-15 DIAGNOSIS — F329 Major depressive disorder, single episode, unspecified: Secondary | ICD-10-CM | POA: Diagnosis not present

## 2018-09-15 DIAGNOSIS — Z8719 Personal history of other diseases of the digestive system: Secondary | ICD-10-CM | POA: Diagnosis not present

## 2018-09-15 DIAGNOSIS — M7711 Lateral epicondylitis, right elbow: Secondary | ICD-10-CM | POA: Diagnosis not present

## 2018-09-15 DIAGNOSIS — M7712 Lateral epicondylitis, left elbow: Secondary | ICD-10-CM | POA: Diagnosis not present

## 2018-09-16 ENCOUNTER — Encounter: Payer: Self-pay | Admitting: Psychiatry

## 2018-09-16 ENCOUNTER — Ambulatory Visit (INDEPENDENT_AMBULATORY_CARE_PROVIDER_SITE_OTHER): Payer: Medicare HMO | Admitting: Psychiatry

## 2018-09-16 VITALS — BP 128/82 | HR 86 | Ht 66.5 in | Wt 249.0 lb

## 2018-09-16 DIAGNOSIS — F4000 Agoraphobia, unspecified: Secondary | ICD-10-CM | POA: Diagnosis not present

## 2018-09-16 DIAGNOSIS — F17200 Nicotine dependence, unspecified, uncomplicated: Secondary | ICD-10-CM | POA: Diagnosis not present

## 2018-09-16 DIAGNOSIS — Z634 Disappearance and death of family member: Secondary | ICD-10-CM

## 2018-09-16 DIAGNOSIS — F410 Panic disorder [episodic paroxysmal anxiety] without agoraphobia: Secondary | ICD-10-CM

## 2018-09-16 DIAGNOSIS — F5105 Insomnia due to other mental disorder: Secondary | ICD-10-CM | POA: Diagnosis not present

## 2018-09-16 DIAGNOSIS — F339 Major depressive disorder, recurrent, unspecified: Secondary | ICD-10-CM | POA: Diagnosis not present

## 2018-09-16 DIAGNOSIS — F99 Mental disorder, not otherwise specified: Secondary | ICD-10-CM

## 2018-09-16 DIAGNOSIS — F1590 Other stimulant use, unspecified, uncomplicated: Secondary | ICD-10-CM | POA: Diagnosis not present

## 2018-09-16 MED ORDER — VARENICLINE TARTRATE 1 MG PO TABS: 1.0000 mg | ORAL_TABLET | Freq: Two times a day (BID) | ORAL | 2 refills | Status: DC

## 2018-09-16 MED ORDER — QUETIAPINE FUMARATE 100 MG PO TABS: ORAL_TABLET | ORAL | 1 refills | Status: DC

## 2018-09-16 MED ORDER — PROPRANOLOL HCL 10 MG PO TABS: 10.0000 mg | ORAL_TABLET | Freq: Three times a day (TID) | ORAL | 1 refills | Status: DC | PRN

## 2018-09-16 NOTE — Progress Notes (Addendum)
[facility] MD OP Progress Note  09/16/2018 5:12 PM Mary Koch  MRN:  [id number]  Chief Complaint: ' I am here for follow up.' Chief Complaint    Follow-up     HPI: Mary Koch is a 47 year old Caucasian female, lives in [place], has a history of depression, anxiety, on SSD, presented to the clinic today for a follow-up visit.  Patient today reports she is currently in pain from possible bursitis of her elbow.  Patient reports she is currently working with her provider.  She reports she may need cortisone injections or may need to go to a specialist.  She reports her pain as 10 out of 10 especially when she moves it.  She reports this has been affecting her mood in general.  She however reports she is coping better with her father's loss.  His anniversary is in February.  She reports she and her family took a vacation to the mountains.  She reports her mother struggled with him not being there and it was hard to watch her go through that.  Patient reports sleep is fair.  Patient denies any significant anxiety attacks.  She reports the medications as effective.  Patient has been cutting down on the caffeine.  She also reports good response to the Chantix and has been smoking only 3-4 cigarettes/day now.  She denies any side effects to the Chantix.  Patient denies any suicidality.  Patient denies any perceptual disturbances.  Patient continues to work with Mary Koch her therapist on a  regular basis.  Patient denies any other concerns today Visit Diagnosis:    ICD-10-CM   1. MDD (major depressive disorder), recurrent episode, with atypical features ([facility]) F33.9 QUEtiapine (SEROQUEL) 100 MG tablet   early remission  2. Bereavement Z63.4   3. Panic disorder F41.0 propranolol (INDERAL) 10 MG tablet  4. Insomnia due to other mental disorder F51.05    F99   5. Tobacco use disorder F17.200 varenicline (CHANTIX) 1 MG tablet  6. Agoraphobia F40.00   7. 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  Past trials of Paxil, Luvox, Klonopin, Zoloft, Seroquel, Wellbutrin  Past Medical History:  Past Medical History:  Diagnosis Date  . Allergic rhinitis   . Anemia   . Anxiety   . Anxiety and depression   . Carpal tunnel syndrome   . Depression   . Fibroids   . GERD (gastroesophageal reflux disease)   . Headache(784.0)   . Headache(784.0)   . Heavy periods   . Hyperthyroidism    right portion of thyroid removed  . Muscle spasm   . Neurological disorder    evaluation for ms  . Painful menstrual periods   . Shortness of breath    when i smoke a lot    Past Surgical History:  Procedure Laterality Date  . BREAST BIOPSY Left    neg  . carpel tunnel Left 2017  . CESAREAN SECTION     times 2  . CHOLECYSTECTOMY    . THYROID LOBECTOMY      Family Psychiatric History: Reviewed family psychiatric history from my progress note on 03/24/2018  Family History:  Family History  Problem Relation Age of Onset  . Osteoporosis Mother   . Diabetes Father   . Anxiety disorder Father   . Depression Father   . Post-traumatic stress disorder Father   . Heart disease Father   .  Anxiety disorder Sister   . Alcohol abuse Brother   . Breast cancer Neg Hx   . Colon cancer Neg Hx     Social History: Reviewed social history from my progress note on 03/24/2018 Social History   Socioeconomic History  . Marital status: Legally Separated    Spouse name: Not on file  . Number of children: Not on file  . Years of education: Not on file  . Highest education level: Not on file  Occupational History  . Not on file  Social Needs  . Financial resource strain: Not on file  . Food insecurity:    Worry: Not on file    Inability: Not on file  . Transportation needs:    Medical: Not on file    Non-medical: Not on file  Tobacco Use  . Smoking status: Current Every Day Smoker    Packs/day: 1.50     Years: 25.00    Pack years: 37.50    Types: Cigarettes    Start date: 05/22/1986  . Smokeless tobacco: Never Used  . Tobacco comment: Has cut back to 4 a day with Chantix  Substance and Sexual Activity  . Alcohol use: No    Alcohol/week: 0.0 standard drinks  . Drug use: No  . Sexual activity: Yes    Partners: Male    Birth control/protection: Surgical  Lifestyle  . Physical activity:    Days per week: Not on file    Minutes per session: Not on file  . Stress: Not on file  Relationships  . Social connections:    Talks on phone: Not on file    Gets together: Not on file    Attends religious service: Not on file    Active member of club or organization: Not on file    Attends meetings of clubs or organizations: Not on file    Relationship status: Not on file  Other Topics Concern  . Not on file  Social History Narrative  . Not on file    Allergies:  Allergies  Allergen Reactions  . Azithromycin Other (See Comments)    headache  . Codeine Other (See Comments)    unknown  . Penicillins Other (See Comments)    headaches    Metabolic Disorder Labs: No results found for: HGBA1C, MPG No results found for: PROLACTIN No results found for: CHOL, TRIG, HDL, CHOLHDL, VLDL, LDLCALC No results found for: TSH  Therapeutic Level Labs: No results found for: LITHIUM No results found for: VALPROATE No components found for:  CBMZ  Current Medications: Current Outpatient Medications  Medication Sig Dispense Refill  . albuterol (PROVENTIL HFA;VENTOLIN HFA) 108 (90 Base) MCG/ACT inhaler INHALE 1 TO 2 PUFFS DAILY    . BIOTIN PO Take by mouth.    Marland Kitchen BLACK ELDERBERRY,BERRY-FLOWER, PO Take by mouth.    . Cyanocobalamin (VITAMIN B 12 PO) Take by mouth.    . cyclobenzaprine (FLEXERIL) 10 MG tablet Take 10 mg by mouth 3 (three) times daily as needed for muscle spasms.    . DULoxetine (CYMBALTA) 60 MG capsule Take 1 capsule (60 mg total) by mouth 2 (two) times daily. 180 capsule 0  .  fluticasone (FLONASE) 50 MCG/ACT nasal spray Place 1 spray into both nostrils daily.  4  . Fluticasone Furoate-Vilanterol (BREO ELLIPTA) 100-25 MCG/INH AEPB INHALE 1 PUFF BY MOUTH ONCE DAILY    . furosemide (LASIX) 20 MG tablet TAKE 1 TABLET BY MOUTH TWICE A WEEK  5  . gabapentin (NEURONTIN) 600  MG tablet Take 1 tablet (600 mg total) by mouth 2 (two) times daily. 180 tablet 1  . medroxyPROGESTERone Acetate 150 MG/ML SUSY Inject 1 mL (150 mg total) into the muscle every 3 (three) months. 1 Syringe 2  . nystatin-triamcinolone (MYCOLOG II) cream Apply 1 application topically 2 (two) times daily. 30 g 1  . omeprazole (PRILOSEC) 40 MG capsule Take 40 mg by mouth daily.    . phentermine (ADIPEX-P) 37.5 MG tablet Take 37.5 mg by mouth daily.  2  . propranolol (INDERAL) 10 MG tablet Take 1 tablet (10 mg total) by mouth 3 (three) times daily as needed. 270 tablet 1  . QUEtiapine (SEROQUEL) 100 MG tablet TAKE 1 TABLET BY MOUTH EVERYDAY AT BEDTIME 90 tablet 1  . rosuvastatin (CRESTOR) 20 MG tablet Take 20 mg by mouth daily.    Marland Kitchen topiramate (TOPAMAX) 100 MG tablet Take 1.5 tablets (150 mg total) by mouth daily. 45 tablet 2  . topiramate (TOPAMAX) 100 MG tablet TAKE 1.5 TABLETS (150 MG TOTAL) BY MOUTH DAILY. 135 tablet 1  . tretinoin (RETIN-A) 0.1 % cream APPLY TO AFFECTED AREA AT BEDTIME  2  . varenicline (CHANTIX) 1 MG tablet Take 1 tablet (1 mg total) by mouth 2 (two) times daily. 60 tablet 2   No current facility-administered medications for this visit.      Musculoskeletal: Strength & Muscle Tone: within normal limits Gait & Station: normal Patient leans: N/A  Psychiatric Specialty Exam: Review of Systems  Psychiatric/Behavioral: Positive for depression (improving). The patient is nervous/anxious.   All other systems reviewed and are negative.   Blood pressure 128/82, pulse 86, height 5' 6.5" (1.689 m), weight 249 lb (112.9 kg), SpO2 96 %.Body mass index is 39.59 kg/m.  General Appearance:  Casual  Eye Contact:  Fair  Speech:  Clear and Coherent  Volume:  Normal  Mood:  Anxious  Affect:  Congruent  Thought Process:  Goal Directed and Descriptions of Associations: Intact  Orientation:  Full (Time, Place, and Person)  Thought Content: Logical   Suicidal Thoughts:  No  Homicidal Thoughts:  No  Memory:  Immediate;   Fair Recent;   Fair Remote;   Fair  Judgement:  Fair  Insight:  Fair  Psychomotor Activity:  Normal  Concentration:  Concentration: Fair and Attention Span: Fair  Recall:  [employer] of Knowledge: Fair  Language: Fair  Akathisia:  No  Handed:  Right  AIMS (if indicated): 0  Assets:  Communication Skills Desire for Improvement Social Support  ADL's:  Intact  Cognition: WNL  Sleep:  Fair   Screenings: AUDIT     Office Visit from 05/22/2016 in [place]  Alcohol Use Disorder Identification Test Final Score (AUDIT)  0    PHQ2-9     Office Visit from 05/22/2016 in [place]  PHQ-2 Total Score  4  PHQ-9 Total Score  18       Assessment and Plan: Betheny is a 47 year old Caucasian female with history of anxiety, depression, bereavement, panic attacks, presented to the clinic today for a follow-up visit.  Patient continues to improve on the current medications although she struggles with pain now.  She reports pain as affecting her day-to-day functioning.  She will continue to follow-up with her providers for the same.  She will continue counseling with Ms. [name] for grief.  Continue plan as noted below.  Plan For anxiety Continue Cymbalta 60 mg p.o. twice daily Continue Neurontin 600 mg  p.o. twice daily Continue propranolol 10 mg p.o. 3 times daily as needed Continue CBT.  For depressive symptoms Seroquel 100 mg p.o. nightly Continue Topamax 150 mg p.o. daily Cymbalta 60 mg p.o. twice daily phq 9 -11  For insomnia Continue Seroquel 100 mg p.o. nightly  For agoraphobia CBT with Ms.  [name].  Tobacco use disorder Chantix 1 mg p.o. 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  Bereavement She will continue grief counseling with Ms. [name].  Follow-up in clinic in 2-3 months or sooner if needed.  More than 50 % of the time was spent for psychoeducation and supportive psychotherapy and care coordination.  This note was generated in part or whole with voice recognition software. Voice recognition is usually quite accurate but there are transcription errors that can and very often do occur. I apologize for any typographical errors that were not detected and corrected.         Ursula Alert, MD 09/16/2018, 5:12 PM

## 2018-09-29 ENCOUNTER — Ambulatory Visit (INDEPENDENT_AMBULATORY_CARE_PROVIDER_SITE_OTHER): Payer: Medicare HMO | Admitting: Licensed Clinical Social Worker

## 2018-09-29 DIAGNOSIS — F339 Major depressive disorder, recurrent, unspecified: Secondary | ICD-10-CM | POA: Diagnosis not present

## 2018-09-30 NOTE — Progress Notes (Signed)
   THERAPIST PROGRESS NOTE  Session Time: 72min  Participation Level: Active  Behavioral Response: CasualAlertDepressed  Type of Therapy: Individual Therapy  Treatment Goals addressed: Coping  Interventions: CBT and Motivational Interviewing  Summary: Mary Koch is a 47 y.o. female who presents with continued symptoms of diagnosis.  Patient reports "ugh" mood.  Allowed patient time to vent.  Patient reports that her landlord has not make repairs to the home (lacks heat and air and poor water pressure).  Discussion of writing letter to landlord to inform of repairs. Explored options for getting her goals accomplished.  Discussion of managing symptoms and taking medications as prescribed.  Suicidal/Homicidal: No  Plan: Return again in 2 weeks.  Diagnosis: Axis I: Depression    Axis II: No diagnosis    Lubertha South, LCSW 09/30/2018

## 2018-10-06 NOTE — Progress Notes (Signed)
   THERAPIST PROGRESS NOTE  Session Time: 81min  Participation Level: Active  Behavioral Response: CasualAlertIrritable  Type of Therapy: Individual Therapy  Treatment Goals addressed: Diagnosis: Depression  Interventions: CBT and Motivational Interviewing  Summary: Mary Koch is a 47 y.o. female who presents with continued symptoms of her diagnosis. Patient reports mood as "okay."  Therapist introduced activity from therapistaid.com to assist with changing thought pattern.  Explored relationship status and how she can convey to her estranged husband that she wants to revisit the relationship.  Patient was interactive with the worksheet "Behavioral Experiment."  Suicidal/Homicidal: No   Diagnosis: Axis I: Depression    Axis II: No diagnosis    Lubertha South, LCSW 09/15/2018

## 2018-10-13 DIAGNOSIS — G988 Other disorders of nervous system: Secondary | ICD-10-CM | POA: Diagnosis not present

## 2018-10-13 DIAGNOSIS — R0602 Shortness of breath: Secondary | ICD-10-CM | POA: Diagnosis not present

## 2018-10-13 DIAGNOSIS — F329 Major depressive disorder, single episode, unspecified: Secondary | ICD-10-CM | POA: Diagnosis not present

## 2018-10-13 DIAGNOSIS — F419 Anxiety disorder, unspecified: Secondary | ICD-10-CM | POA: Diagnosis not present

## 2018-10-28 NOTE — Progress Notes (Signed)
Date last pap: 10/30/17. Last Depo-Provera: 08/11/18. Side Effects if any: NA Serum HCG indicated? NA Depo-Provera 150 mg IM given by: FH, LPN Next appointment due 01/15/19-01/29/19  BP 127/82   Pulse 93   Ht 5' 6.5" (1.689 m)   Wt 245 lb 14.4 oz (111.5 kg)   BMI 39.09 kg/m

## 2018-10-30 ENCOUNTER — Ambulatory Visit (INDEPENDENT_AMBULATORY_CARE_PROVIDER_SITE_OTHER): Payer: Medicare HMO | Admitting: Certified Nurse Midwife

## 2018-10-30 VITALS — BP 127/82 | HR 93 | Ht 66.5 in | Wt 245.9 lb

## 2018-10-30 DIAGNOSIS — Z3042 Encounter for surveillance of injectable contraceptive: Secondary | ICD-10-CM

## 2018-10-30 MED ORDER — MEDROXYPROGESTERONE ACETATE 150 MG/ML IM SUSP
150.0000 mg | Freq: Once | INTRAMUSCULAR | Status: AC
  Administered 2018-10-30: 150 mg via INTRAMUSCULAR

## 2018-10-30 NOTE — Progress Notes (Signed)
I have reviewed the record and concur with patient management and plan.    Gillian Meeuwsen Michelle Cydne Grahn, CNM [facility] 

## 2018-11-04 ENCOUNTER — Ambulatory Visit (INDEPENDENT_AMBULATORY_CARE_PROVIDER_SITE_OTHER): Payer: Medicare HMO | Admitting: Licensed Clinical Social Worker

## 2018-11-04 DIAGNOSIS — F339 Major depressive disorder, recurrent, unspecified: Secondary | ICD-10-CM | POA: Diagnosis not present

## 2018-11-05 DIAGNOSIS — G43119 Migraine with aura, intractable, without status migrainosus: Secondary | ICD-10-CM | POA: Diagnosis not present

## 2018-11-05 DIAGNOSIS — G5603 Carpal tunnel syndrome, bilateral upper limbs: Secondary | ICD-10-CM | POA: Diagnosis not present

## 2018-11-06 NOTE — Progress Notes (Signed)
   THERAPIST PROGRESS NOTE  Session Time: 45min  Participation Level: Active  Behavioral Response: CasualAlertEuthymic  Type of Therapy: Individual Therapy  Treatment Goals addressed: Coping  Interventions: CBT and Motivational Interviewing  Summary: Mary Koch is a 47 y.o. female who presents with a reduction in symptoms.  Therapist met with Patient in an outpatient setting to assess current mood and assist with making progress towards goals through the use of therapeutic intervention. Therapist did a brief mood check, assessing anger, fear, disgust, excitement, happiness, and sadness. Patient reports "favorable" mood.  Therapist probed about Patient's relationships with friends.  Therapist encouraged patient to begin dating.  Therapist discussed with Patient what he means to her to date someone.  Patient reports that she feels inferior and less than.  Therapist assisted Patient with listing positive qualities about herself and gave homework to Patient to do the same at home in the mirror for 2 minutes daily.    Suicidal/Homicidal: No  Plan: Return again in 2 weeks.  Diagnosis: Axis I: Depression    Axis II: No diagnosis     M , LCSW 11/04/2018  

## 2018-11-19 ENCOUNTER — Telehealth: Payer: Self-pay

## 2018-11-19 NOTE — Telephone Encounter (Signed)
Attempted to call . Got voicemail.

## 2018-11-19 NOTE — Telephone Encounter (Signed)
[facility] neurology wants to speak with you about changing patient medication.  please call [phone]

## 2018-11-20 NOTE — Telephone Encounter (Signed)
Returned call to neurology. Spoke to Mary Koch regarding patient. She wanted to know if its ok to increase her Topamax to 150 mg. Discussed that she is already on 150 mg. Also discussed its OK to increase further if needed.

## 2018-11-25 ENCOUNTER — Other Ambulatory Visit: Payer: Self-pay | Admitting: Psychiatry

## 2018-11-25 DIAGNOSIS — F4000 Agoraphobia, unspecified: Secondary | ICD-10-CM

## 2018-12-10 ENCOUNTER — Ambulatory Visit: Payer: Medicare HMO | Admitting: Licensed Clinical Social Worker

## 2018-12-16 ENCOUNTER — Encounter: Payer: Self-pay | Admitting: Psychiatry

## 2018-12-16 ENCOUNTER — Other Ambulatory Visit: Payer: Self-pay | Admitting: Psychiatry

## 2018-12-16 ENCOUNTER — Other Ambulatory Visit: Payer: Self-pay

## 2018-12-16 ENCOUNTER — Ambulatory Visit (INDEPENDENT_AMBULATORY_CARE_PROVIDER_SITE_OTHER): Payer: Medicare HMO | Admitting: Psychiatry

## 2018-12-16 VITALS — BP 128/84 | HR 102 | Temp 99.0°F | Wt 249.4 lb

## 2018-12-16 DIAGNOSIS — F410 Panic disorder [episodic paroxysmal anxiety] without agoraphobia: Secondary | ICD-10-CM | POA: Diagnosis not present

## 2018-12-16 DIAGNOSIS — F4000 Agoraphobia, unspecified: Secondary | ICD-10-CM

## 2018-12-16 DIAGNOSIS — F1590 Other stimulant use, unspecified, uncomplicated: Secondary | ICD-10-CM | POA: Diagnosis not present

## 2018-12-16 DIAGNOSIS — F17200 Nicotine dependence, unspecified, uncomplicated: Secondary | ICD-10-CM | POA: Diagnosis not present

## 2018-12-16 DIAGNOSIS — F99 Mental disorder, not otherwise specified: Secondary | ICD-10-CM | POA: Diagnosis not present

## 2018-12-16 DIAGNOSIS — Z634 Disappearance and death of family member: Secondary | ICD-10-CM | POA: Diagnosis not present

## 2018-12-16 DIAGNOSIS — F5105 Insomnia due to other mental disorder: Secondary | ICD-10-CM | POA: Diagnosis not present

## 2018-12-16 DIAGNOSIS — F339 Major depressive disorder, recurrent, unspecified: Secondary | ICD-10-CM

## 2018-12-16 MED ORDER — DULOXETINE HCL 60 MG PO CPEP: 60.0000 mg | ORAL_CAPSULE | Freq: Two times a day (BID) | ORAL | 0 refills | Status: DC

## 2018-12-16 MED ORDER — GABAPENTIN 600 MG PO TABS: 600.0000 mg | ORAL_TABLET | Freq: Two times a day (BID) | ORAL | 1 refills | Status: DC

## 2018-12-16 MED ORDER — QUETIAPINE FUMARATE 100 MG PO TABS: ORAL_TABLET | ORAL | 1 refills | Status: DC

## 2018-12-16 MED ORDER — VARENICLINE TARTRATE 1 MG PO TABS: 1.0000 mg | ORAL_TABLET | Freq: Two times a day (BID) | ORAL | 1 refills | Status: DC

## 2018-12-16 NOTE — Progress Notes (Signed)
B[facility]andas MD  OP Progress Note  12/16/2018 5:20 PM Mary Koch  MRN:  [id number]  Chief Complaint: ' I am here for follow up.' Chief Complaint    Follow-up; Medication Refill     HPI: Mary Koch is a 47 year old Caucasian female, lives in [place], has a history of depression, anxiety, on SSD, presented to the clinic today for a follow-up visit.  Patient today reports she continues to grieve the loss of her dad.  She reports she has to deal with her mom who is having irritability and depressive symptoms.  She reports there are times when she has difficulty taking care of her own life and taking care of her mom at the same time.  Patient reports she is relocating to a new house which is a good thing.  She reports her old house had mold in it.  She reports she is moving into a mobile home on her ex-husband's property.  She will still have to pay him rent.  But she reports it is a better house and she is happy about that.  She reports she is compliant on her medications as prescribed.  She denies any side effects.  She reports sleep is restless with the move coming up.  She however denies wanting her medication readjusted for sleep.  Patient reports she is cutting down on smoking cigarettes and currently a pack lasts for 4 to 5 days.  She continues to be on the Chantix.  Patient reports she continues to work with Ms. Royal Piedra and wants to stay in therapy.  She reports her neurologist increased her Topamax and just wanted to let writer know about it.  Discussed with patient that her Topamax prescription now can be transferred to her neurologist who can refill it for her.  Patient denies any suicidality.  Patient denies any perceptual disturbances. Visit Diagnosis:    ICD-10-CM   1. MDD (major depressive disorder), recurrent episode, with atypical features (HCC) F33.9 QUEtiapine (SEROQUEL) 100 MG tablet   early remission  2. Bereavement Z63.4   3. Panic disorder F41.0 DULoxetine (CYMBALTA) 60 MG  capsule  4. Tobacco use disorder F17.200 varenicline (CHANTIX) 1 MG tablet  5. Agoraphobia F40.00 gabapentin (NEURONTIN) 600 MG tablet  6. Insomnia due to other mental disorder F51.05    F99   7. 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  Past trials of Paxil, Luvox, Klonopin, Zoloft, Seroquel, Wellbutrin.  Past Medical History:  Past Medical History:  Diagnosis Date  . Allergic rhinitis   . Anemia   . Anxiety   . Anxiety and depression   . Carpal tunnel syndrome   . Depression   . Fibroids   . GERD (gastroesophageal reflux disease)   . Headache(784.0)   . Headache(784.0)   . Heavy periods   . Hyperthyroidism    right portion of thyroid removed  . Muscle spasm   . Neurological disorder    evaluation for ms  . Painful menstrual periods   . Shortness of breath    when i smoke a lot    Past Surgical History:  Procedure Laterality Date  . BREAST BIOPSY Left    neg  . carpel tunnel Left 2017  . CESAREAN SECTION     times 2  . CHOLECYSTECTOMY    . THYROID LOBECTOMY      Family Psychiatric History: Reviewed family psychiatric history from  my progress note on 03/24/2018.  Family History:  Family History  Problem Relation Age of Onset  . Osteoporosis Mother   . Diabetes Father   . Anxiety disorder Father   . Depression Father   . Post-traumatic stress disorder Father   . Heart disease Father   . Anxiety disorder Sister   . Alcohol abuse Brother   . Breast cancer Neg Hx   . Colon cancer Neg Hx     Social History: Reviewed social history from my progress note on 03/24/2018 Social History   Socioeconomic History  . Marital status: Legally Separated    Spouse name: Not on file  . Number of children: Not on file  . Years of education: Not on file  . Highest education level: Not on file  Occupational History  . Not on file  Social Needs  . Financial  resource strain: Not on file  . Food insecurity:    Worry: Not on file    Inability: Not on file  . Transportation needs:    Medical: Not on file    Non-medical: Not on file  Tobacco Use  . Smoking status: Current Every Day Smoker    Packs/day: 0.25    Years: 25.00    Pack years: 6.25    Types: Cigarettes    Start date: 05/22/1986  . Smokeless tobacco: Never Used  . Tobacco comment: Has cut back to 4 a day with Chantix  Substance and Sexual Activity  . Alcohol use: No    Alcohol/week: 0.0 standard drinks  . Drug use: No  . Sexual activity: Yes    Partners: Male    Birth control/protection: Surgical  Lifestyle  . Physical activity:    Days per week: Not on file    Minutes per session: Not on file  . Stress: Not on file  Relationships  . Social connections:    Talks on phone: Not on file    Gets together: Not on file    Attends religious service: Not on file    Active member of club or organization: Not on file    Attends meetings of clubs or organizations: Not on file    Relationship status: Not on file  Other Topics Concern  . Not on file  Social History Narrative  . Not on file    Allergies:  Allergies  Allergen Reactions  . Azithromycin Other (See Comments)    headache  . Codeine Other (See Comments)    unknown  . Penicillins Other (See Comments)    headaches    Metabolic Disorder Labs: No results found for: HGBA1C, MPG No results found for: PROLACTIN No results found for: CHOL, TRIG, HDL, CHOLHDL, VLDL, LDLCALC No results found for: TSH  Therapeutic Level Labs: No results found for: LITHIUM No results found for: VALPROATE No components found for:  CBMZ  Current Medications: Current Outpatient Medications  Medication Sig Dispense Refill  . albuterol (PROVENTIL HFA;VENTOLIN HFA) 108 (90 Base) MCG/ACT inhaler INHALE 1 TO 2 PUFFS DAILY    . BIOTIN PO Take by mouth.    Marland Kitchen BLACK ELDERBERRY,BERRY-FLOWER, PO Take by mouth.    . Cyanocobalamin (VITAMIN B  12 PO) Take by mouth.    . cyclobenzaprine (FLEXERIL) 10 MG tablet Take 10 mg by mouth 3 (three) times daily as needed for muscle spasms.    . DULoxetine (CYMBALTA) 60 MG capsule Take 1 capsule (60 mg total) by mouth 2 (two) times daily. 180 capsule 0  . fluticasone (FLONASE)  50 MCG/ACT nasal spray Place 1 spray into both nostrils daily.  4  . Fluticasone Furoate-Vilanterol (BREO ELLIPTA) 100-25 MCG/INH AEPB INHALE 1 PUFF BY MOUTH ONCE DAILY    . furosemide (LASIX) 20 MG tablet TAKE 1 TABLET BY MOUTH TWICE A WEEK  5  . gabapentin (NEURONTIN) 600 MG tablet Take 1 tablet (600 mg total) by mouth 2 (two) times daily. 180 tablet 1  . medroxyPROGESTERone Acetate 150 MG/ML SUSY Inject 1 mL (150 mg total) into the muscle every 3 (three) months. 1 Syringe 2  . meloxicam (MOBIC) 7.5 MG tablet Take 7.5 mg by mouth daily.  0  . nystatin-triamcinolone (MYCOLOG II) cream Apply 1 application topically 2 (two) times daily. 30 g 1  . omeprazole (PRILOSEC) 40 MG capsule Take 40 mg by mouth daily.    . phentermine (ADIPEX-P) 37.5 MG tablet Take 37.5 mg by mouth daily.  2  . propranolol (INDERAL) 10 MG tablet Take 1 tablet (10 mg total) by mouth 3 (three) times daily as needed. 270 tablet 1  . QUEtiapine (SEROQUEL) 100 MG tablet TAKE 1 TABLET BY MOUTH EVERYDAY AT BEDTIME 90 tablet 1  . rosuvastatin (CRESTOR) 20 MG tablet Take 20 mg by mouth daily.    Marland Kitchen topiramate (TOPAMAX) 100 MG tablet TAKE 1.5 TABLETS (150 MG TOTAL) BY MOUTH DAILY. 135 tablet 1  . topiramate (TOPAMAX) 100 MG tablet Take 150 mg ( 1 and half tabs) in the morning and 300 mg ( 3 tabs ) at night    . tretinoin (RETIN-A) 0.1 % cream APPLY TO AFFECTED AREA AT BEDTIME  2  . varenicline (CHANTIX) 1 MG tablet Take 1 tablet (1 mg total) by mouth 2 (two) times daily. 60 tablet 1   No current facility-administered medications for this visit.      Musculoskeletal: Strength & Muscle Tone: within normal limits Gait & Station: normal Patient leans:  N/A  Psychiatric Specialty Exam: Review of Systems  Psychiatric/Behavioral: The patient is nervous/anxious and has insomnia.   All other systems reviewed and are negative.   Blood pressure 128/84, pulse (!) 102, temperature 99 F (37.2 C), temperature source Oral, weight 249 lb 6.4 oz (113.1 kg).Body mass index is 39.65 kg/m.  General Appearance: Casual  Eye Contact:  Fair  Speech:  Normal Rate  Volume:  Normal  Mood:  Anxious  Affect:  Congruent  Thought Process:  Goal Directed and Descriptions of Associations: Intact  Orientation:  Full (Time, Place, and Person)  Thought Content: Logical   Suicidal Thoughts:  No  Homicidal Thoughts:  No  Memory:  Immediate;   Fair Recent;   Fair Remote;   Fair  Judgement:  Fair  Insight:  Fair  Psychomotor Activity:  Normal  Concentration:  Concentration: Fair and Attention Span: Fair  Recall:  [employer] of Knowledge: Fair  Language: Fair  Akathisia:  No  Handed:  Right  AIMS (if indicated): 0  Assets:  Communication Skills Desire for Improvement Social Support  ADL's:  Intact  Cognition: WNL  Sleep:  restless   Screenings: AUDIT     Office Visit from 05/22/2016 in [facility]  Alcohol Use Disorder Identification Test Final Score (AUDIT)  0    PHQ2-9     Office Visit from 05/22/2016 in [place] [facility]  PHQ-2 Total Score  4  PHQ-9 Total Score  18       Assessment and Plan: Wiley is a 47 year old Caucasian female with history of anxiety, depression,  bereavement, panic attacks, presented to the clinic today for a follow-up visit.  Patient continues to grieve the loss of her father .  She also has psychosocial stressors of moving to a new house which is affecting her mood and sleep.  Patient however reports she would like to stay on the same medications at this time and will continue psychotherapy with Ms. [name] at this time.  Plan For anxiety Cymbalta 60 mg p.o. twice  daily Neurontin 600 mg p.o. twice daily Propranolol 10 mg p.o. 3 times daily as needed Continue CBT  For depressive symptoms Seroquel 100 mg p.o. nightly Her Topamax dosage was recently readjusted by her neurologist for headaches- she will continue it as prescribed per neurology. Cymbalta 60 mg p.o. twice daily  Insomnia Continue Seroquel as prescribed  For agoraphobia CBT  Tobacco use disorder-improving Chantix 1 mg p.o. twice daily  Caffeine use disorder in early remission Patient continues to stay away  For bereavement She will continue psychotherapy with Ms. [name].  Follow-up in clinic in 3 months or sooner if needed.  More than 50 % of the time was spent for psychoeducation and supportive psychotherapy and care coordination.  This note was generated in part or whole with voice recognition software. Voice recognition is usually quite accurate but there are transcription errors that can and very often do occur. I apologize for any typographical errors that were not detected and corrected.       Ursula Alert, MD 12/16/2018, 5:20 PM

## 2018-12-31 ENCOUNTER — Ambulatory Visit (INDEPENDENT_AMBULATORY_CARE_PROVIDER_SITE_OTHER): Payer: Medicare HMO | Admitting: Licensed Clinical Social Worker

## 2018-12-31 DIAGNOSIS — F339 Major depressive disorder, recurrent, unspecified: Secondary | ICD-10-CM

## 2019-01-05 ENCOUNTER — Other Ambulatory Visit: Payer: Self-pay | Admitting: Psychiatry

## 2019-01-05 DIAGNOSIS — F339 Major depressive disorder, recurrent, unspecified: Secondary | ICD-10-CM

## 2019-01-11 DIAGNOSIS — K210 Gastro-esophageal reflux disease with esophagitis: Secondary | ICD-10-CM | POA: Diagnosis not present

## 2019-01-11 DIAGNOSIS — E8881 Metabolic syndrome: Secondary | ICD-10-CM | POA: Diagnosis not present

## 2019-01-11 DIAGNOSIS — F419 Anxiety disorder, unspecified: Secondary | ICD-10-CM | POA: Diagnosis not present

## 2019-01-11 DIAGNOSIS — E669 Obesity, unspecified: Secondary | ICD-10-CM | POA: Diagnosis not present

## 2019-01-15 ENCOUNTER — Ambulatory Visit: Payer: Medicare HMO

## 2019-01-15 NOTE — Progress Notes (Deleted)
Date last pap: 10/30/17. Last Depo-Provera: 10/30/18. Side Effects if any: none. Serum HCG indicated? n/a. Depo-Provera 150 mg IM given by: FH, LPN. Next appointment due April 4-18, 2020.

## 2019-01-18 ENCOUNTER — Ambulatory Visit (INDEPENDENT_AMBULATORY_CARE_PROVIDER_SITE_OTHER): Payer: Medicare HMO | Admitting: Obstetrics and Gynecology

## 2019-01-18 VITALS — BP 113/77 | HR 88 | Ht 65.0 in | Wt 250.0 lb

## 2019-01-18 DIAGNOSIS — Z3042 Encounter for surveillance of injectable contraceptive: Secondary | ICD-10-CM | POA: Diagnosis not present

## 2019-01-18 MED ORDER — MEDROXYPROGESTERONE ACETATE 150 MG/ML IM SUSP
150.0000 mg | Freq: Once | INTRAMUSCULAR | Status: AC
  Administered 2019-01-18: 150 mg via INTRAMUSCULAR

## 2019-01-18 NOTE — Progress Notes (Signed)
Date last pap:  Last Depo-Provera: 10/30/18 Side Effects if any: na Serum HCG indicated? na Depo-Provera 150 mg IM given by: Keturah Barre, CMA Next appointment due  April 7- April 20, 2019 BP 113/77   Pulse 88   Ht 5\' 5"  (1.651 m)   Wt 250 lb (113.4 kg)   BMI 41.60 kg/m

## 2019-01-29 DIAGNOSIS — K210 Gastro-esophageal reflux disease with esophagitis: Secondary | ICD-10-CM | POA: Diagnosis not present

## 2019-01-29 DIAGNOSIS — R12 Heartburn: Secondary | ICD-10-CM | POA: Diagnosis not present

## 2019-01-29 DIAGNOSIS — Z8719 Personal history of other diseases of the digestive system: Secondary | ICD-10-CM | POA: Diagnosis not present

## 2019-01-29 DIAGNOSIS — G988 Other disorders of nervous system: Secondary | ICD-10-CM | POA: Diagnosis not present

## 2019-02-04 ENCOUNTER — Ambulatory Visit: Payer: Medicare HMO | Admitting: Licensed Clinical Social Worker

## 2019-02-24 ENCOUNTER — Ambulatory Visit: Payer: Medicare HMO | Admitting: Licensed Clinical Social Worker

## 2019-02-25 DIAGNOSIS — E8881 Metabolic syndrome: Secondary | ICD-10-CM | POA: Diagnosis not present

## 2019-02-25 DIAGNOSIS — R4589 Other symptoms and signs involving emotional state: Secondary | ICD-10-CM | POA: Diagnosis not present

## 2019-02-25 DIAGNOSIS — E669 Obesity, unspecified: Secondary | ICD-10-CM | POA: Diagnosis not present

## 2019-02-25 DIAGNOSIS — K210 Gastro-esophageal reflux disease with esophagitis: Secondary | ICD-10-CM | POA: Diagnosis not present

## 2019-02-26 ENCOUNTER — Other Ambulatory Visit: Payer: Self-pay | Admitting: Internal Medicine

## 2019-02-26 DIAGNOSIS — R1900 Intra-abdominal and pelvic swelling, mass and lump, unspecified site: Secondary | ICD-10-CM

## 2019-02-26 DIAGNOSIS — R109 Unspecified abdominal pain: Secondary | ICD-10-CM

## 2019-03-02 ENCOUNTER — Ambulatory Visit
Admission: RE | Admit: 2019-03-02 | Discharge: 2019-03-02 | Disposition: A | Discharge status: Home / Self Care | Payer: Medicare HMO | Source: Ambulatory Visit | Attending: Internal Medicine | Admitting: Internal Medicine

## 2019-03-02 ENCOUNTER — Other Ambulatory Visit: Payer: Self-pay

## 2019-03-02 DIAGNOSIS — R109 Unspecified abdominal pain: Secondary | ICD-10-CM

## 2019-03-02 DIAGNOSIS — R1900 Intra-abdominal and pelvic swelling, mass and lump, unspecified site: Secondary | ICD-10-CM | POA: Insufficient documentation

## 2019-03-04 DIAGNOSIS — F419 Anxiety disorder, unspecified: Secondary | ICD-10-CM | POA: Diagnosis not present

## 2019-03-04 DIAGNOSIS — E669 Obesity, unspecified: Secondary | ICD-10-CM | POA: Diagnosis not present

## 2019-03-04 DIAGNOSIS — E8881 Metabolic syndrome: Secondary | ICD-10-CM | POA: Diagnosis not present

## 2019-03-04 DIAGNOSIS — F329 Major depressive disorder, single episode, unspecified: Secondary | ICD-10-CM | POA: Diagnosis not present

## 2019-03-05 DIAGNOSIS — F5104 Psychophysiologic insomnia: Secondary | ICD-10-CM | POA: Diagnosis not present

## 2019-03-05 DIAGNOSIS — G43119 Migraine with aura, intractable, without status migrainosus: Secondary | ICD-10-CM | POA: Diagnosis not present

## 2019-03-05 DIAGNOSIS — G5603 Carpal tunnel syndrome, bilateral upper limbs: Secondary | ICD-10-CM | POA: Diagnosis not present

## 2019-03-17 ENCOUNTER — Ambulatory Visit: Payer: Medicare HMO | Admitting: Psychiatry

## 2019-03-23 NOTE — Progress Notes (Signed)
   THERAPIST PROGRESS NOTE  Session Time: 80min  Participation Level: Active  Behavioral Response: CasualAlertIrritable  Type of Therapy: Individual Therapy  Treatment Goals addressed: Coping and Diagnosis: Depression  Interventions: CBT and Motivational Interviewing  Summary: Mary Koch is a 49 y.o. female who presents with continued symptoms of her diagnosis.  Therapist reviewed with Patient her goals.  Therapist actively listened as Patient reports being able to make progress toward her goals.  Therapist reviewed with Patient build social supports.  Therapist discussed with Patient the importance of social support.  Therapist utilized [employer] Topic 4: Building Social Supports to assisted Patient with strategies for getting closer to people.  Therapist role-played with Patient how to develop closer relationships.  Therapist allowed Patient time to complete the exercise: Things you can say to increase closeness. At the end of the session, Therapist reviewed with Patient things you can do to develop closer relationships.   Suicidal/Homicidal: No  Plan: Return again in 2weeks.  Diagnosis: Axis I: Depression    Axis II: No diagnosis    Lubertha South, LCSW 12/31/2018

## 2019-03-25 ENCOUNTER — Ambulatory Visit (INDEPENDENT_AMBULATORY_CARE_PROVIDER_SITE_OTHER): Payer: Medicare HMO | Admitting: Licensed Clinical Social Worker

## 2019-03-25 ENCOUNTER — Other Ambulatory Visit: Payer: Self-pay

## 2019-03-25 DIAGNOSIS — F339 Major depressive disorder, recurrent, unspecified: Secondary | ICD-10-CM

## 2019-03-25 NOTE — Progress Notes (Signed)
  Virtual Visit via Telephone Note  I connected with Mary Koch on 03/25/19 at 11:00 AM EDT by telephone and verified that I am speaking with the correct person using two identifiers.   I discussed the limitations, risks, security and privacy concerns of performing an evaluation and management service by telephone and the availability of in person appointments. I also discussed with the patient that there may be a patient responsible charge related to this service. The patient expressed understanding and agreed to proceed.     Observations/Objective: Patient was talkative and interactive.  Patient expressed depressive symptoms and concern about CoVid 19.   Assessment and Plan: Use coping skills such as: taking walks, talking to friends on the phone and developing a routine to reduce depression   Follow Up Instructions: Virtual Visits will continue every 2 weeks    I discussed the assessment and treatment plan with the patient. The patient was provided an opportunity to ask questions and all were answered. The patient agreed with the plan and demonstrated an understanding of the instructions.   The patient was advised to call back or seek an in-person evaluation if the symptoms worsen or if the condition fails to improve as anticipated.  I provided 20 minutes of non-face-to-face time during this encounter.   Lubertha South, LCSW

## 2019-04-06 ENCOUNTER — Other Ambulatory Visit: Payer: Self-pay

## 2019-04-06 ENCOUNTER — Telehealth: Payer: Self-pay

## 2019-04-06 DIAGNOSIS — N921 Excessive and frequent menstruation with irregular cycle: Secondary | ICD-10-CM

## 2019-04-06 MED ORDER — MEDROXYPROGESTERONE ACETATE 150 MG/ML IM SUSY: 150.0000 mg | PREFILLED_SYRINGE | INTRAMUSCULAR | 0 refills | Status: DC

## 2019-04-06 NOTE — Telephone Encounter (Signed)
Coronavirus (COVID-19) Are you at risk?  Are you at risk for the Coronavirus (COVID-19)?  To be considered HIGH RISK for Coronavirus (COVID-19), you have to meet the following criteria:  . Traveled to [place], [place], [place], [place] or [place]; or in the [place] to [place], [place], [place], or [place]; and have fever, cough, and shortness of breath within the last 2 weeks of travel OR . Been in close contact with a person diagnosed with COVID-19 within the last 2 weeks and have fever, cough, and shortness of breath . IF YOU DO NOT MEET THESE CRITERIA, YOU ARE CONSIDERED LOW RISK FOR COVID-19.  What to do if you are HIGH RISK for COVID-19?  . If you are having a medical emergency, call 911. . Seek medical care right away. Before you go to a doctor's office, urgent care or emergency department, call ahead and tell them about your recent travel, contact with someone diagnosed with COVID-19, and your symptoms. You should receive instructions from your physician's office regarding next steps of care.  . When you arrive at healthcare provider, tell the healthcare staff immediately you have returned from visiting [place], [place], [place], [place] or [place]; or traveled in the [place] to [place], [place], [place], or [place]; in the last two weeks or you have been in close contact with a person diagnosed with COVID-19 in the last 2 weeks.   . Tell the health care staff about your symptoms: fever, cough and shortness of breath. . After you have been seen by a medical provider, you will be either: o Tested for (COVID-19) and discharged home on quarantine except to seek medical care if symptoms worsen, and asked to  - Stay home and avoid contact with others until you get your results (4-5 days)  - Avoid travel on public transportation if possible (such as bus, train, or airplane) or o Sent to the Emergency Department by EMS for evaluation, COVID-19 testing, and possible  admission depending on your condition and test results.  What to do if you are LOW RISK for COVID-19?  Reduce your risk of any infection by using the same precautions used for avoiding the common cold or flu:  . Wash your hands often with soap and warm water for at least 20 seconds.  If soap and water are not readily available, use an alcohol-based hand sanitizer with at least 60% alcohol.  . If coughing or sneezing, cover your mouth and nose by coughing or sneezing into the elbow areas of your shirt or coat, into a tissue or into your sleeve (not your hands). . Avoid shaking hands with others and consider head nods or verbal greetings only. . Avoid touching your eyes, nose, or mouth with unwashed hands.  . Avoid close contact with people who are sick. . Avoid places or events with large numbers of people in one location, like concerts or sporting events. . Carefully consider travel plans you have or are making. . If you are planning any travel outside or inside the [place], visit the CDC's Travelers' Health webpage for the latest health notices. . If you have some symptoms but not all symptoms, continue to monitor at home and seek medical attention if your symptoms worsen. . If you are having a medical emergency, call 911.   ADDITIONAL HEALTHCARE OPTIONS FOR PATIENTS  [facility] / e-Visit: [email or web address]         [facility]: [phone]  [place]   Urgent Care: [facility]: [phone]  Prescreened- neg. Cm Depo- erx.

## 2019-04-07 ENCOUNTER — Ambulatory Visit (INDEPENDENT_AMBULATORY_CARE_PROVIDER_SITE_OTHER): Payer: Medicare HMO | Admitting: Obstetrics and Gynecology

## 2019-04-07 ENCOUNTER — Ambulatory Visit: Payer: Self-pay

## 2019-04-07 ENCOUNTER — Other Ambulatory Visit: Payer: Self-pay

## 2019-04-07 VITALS — BP 111/65 | HR 86 | Ht 66.5 in | Wt 257.2 lb

## 2019-04-07 DIAGNOSIS — Z3042 Encounter for surveillance of injectable contraceptive: Secondary | ICD-10-CM | POA: Diagnosis not present

## 2019-04-07 MED ORDER — MEDROXYPROGESTERONE ACETATE 150 MG/ML IM SUSP
150.0000 mg | Freq: Once | INTRAMUSCULAR | Status: AC
  Administered 2019-04-07: 150 mg via INTRAMUSCULAR

## 2019-04-07 NOTE — Progress Notes (Signed)
Last depo inj:01/18/19 UPT:N/A Side effects:none Next Depo- Provera injection due: 06/23/19-07/07/19 Annual exam due:11/05/20

## 2019-04-08 ENCOUNTER — Other Ambulatory Visit: Payer: Self-pay | Admitting: Psychiatry

## 2019-04-08 ENCOUNTER — Encounter: Payer: Self-pay | Admitting: Certified Nurse Midwife

## 2019-04-08 DIAGNOSIS — F410 Panic disorder [episodic paroxysmal anxiety] without agoraphobia: Secondary | ICD-10-CM

## 2019-04-12 DIAGNOSIS — F329 Major depressive disorder, single episode, unspecified: Secondary | ICD-10-CM | POA: Diagnosis not present

## 2019-04-12 DIAGNOSIS — F419 Anxiety disorder, unspecified: Secondary | ICD-10-CM | POA: Diagnosis not present

## 2019-04-12 DIAGNOSIS — K210 Gastro-esophageal reflux disease with esophagitis: Secondary | ICD-10-CM | POA: Diagnosis not present

## 2019-04-15 ENCOUNTER — Other Ambulatory Visit: Payer: Self-pay | Admitting: Psychiatry

## 2019-04-15 DIAGNOSIS — F410 Panic disorder [episodic paroxysmal anxiety] without agoraphobia: Secondary | ICD-10-CM

## 2019-05-11 DIAGNOSIS — E8881 Metabolic syndrome: Secondary | ICD-10-CM | POA: Diagnosis not present

## 2019-05-11 DIAGNOSIS — S9030XA Contusion of unspecified foot, initial encounter: Secondary | ICD-10-CM | POA: Diagnosis not present

## 2019-05-11 DIAGNOSIS — E669 Obesity, unspecified: Secondary | ICD-10-CM | POA: Diagnosis not present

## 2019-05-14 ENCOUNTER — Other Ambulatory Visit: Payer: Self-pay

## 2019-05-14 ENCOUNTER — Ambulatory Visit (INDEPENDENT_AMBULATORY_CARE_PROVIDER_SITE_OTHER): Payer: Medicare HMO | Admitting: Licensed Clinical Social Worker

## 2019-05-14 DIAGNOSIS — F339 Major depressive disorder, recurrent, unspecified: Secondary | ICD-10-CM | POA: Diagnosis not present

## 2019-05-25 ENCOUNTER — Encounter: Payer: Self-pay | Admitting: Certified Nurse Midwife

## 2019-05-25 ENCOUNTER — Ambulatory Visit (INDEPENDENT_AMBULATORY_CARE_PROVIDER_SITE_OTHER): Payer: Medicare HMO | Admitting: Psychiatry

## 2019-05-25 ENCOUNTER — Other Ambulatory Visit: Payer: Self-pay

## 2019-05-25 ENCOUNTER — Encounter: Payer: Self-pay | Admitting: Psychiatry

## 2019-05-25 DIAGNOSIS — F1590 Other stimulant use, unspecified, uncomplicated: Secondary | ICD-10-CM | POA: Diagnosis not present

## 2019-05-25 DIAGNOSIS — F410 Panic disorder [episodic paroxysmal anxiety] without agoraphobia: Secondary | ICD-10-CM

## 2019-05-25 DIAGNOSIS — F5105 Insomnia due to other mental disorder: Secondary | ICD-10-CM | POA: Diagnosis not present

## 2019-05-25 DIAGNOSIS — F339 Major depressive disorder, recurrent, unspecified: Secondary | ICD-10-CM | POA: Diagnosis not present

## 2019-05-25 DIAGNOSIS — Z634 Disappearance and death of family member: Secondary | ICD-10-CM

## 2019-05-25 DIAGNOSIS — F17200 Nicotine dependence, unspecified, uncomplicated: Secondary | ICD-10-CM

## 2019-05-25 DIAGNOSIS — F99 Mental disorder, not otherwise specified: Secondary | ICD-10-CM

## 2019-05-25 DIAGNOSIS — F4000 Agoraphobia, unspecified: Secondary | ICD-10-CM | POA: Diagnosis not present

## 2019-05-25 MED ORDER — QUETIAPINE FUMARATE 100 MG PO TABS: ORAL_TABLET | ORAL | 1 refills | Status: DC

## 2019-05-25 MED ORDER — VARENICLINE TARTRATE 1 MG PO TABS: 1.0000 mg | ORAL_TABLET | Freq: Two times a day (BID) | ORAL | 0 refills | Status: DC

## 2019-05-25 MED ORDER — GABAPENTIN 600 MG PO TABS: 600.0000 mg | ORAL_TABLET | Freq: Two times a day (BID) | ORAL | 1 refills | Status: DC

## 2019-05-25 NOTE — Progress Notes (Signed)
Virtual Visit via Video Note  I connected with Mary Koch on 05/25/19 at 11:15 AM EDT by a video enabled telemedicine application and verified that I am speaking with the correct person using two identifiers.   I discussed the limitations of evaluation and management by telemedicine and the availability of in person appointments. The patient expressed understanding and agreed to proceed.    I discussed the assessment and treatment plan with the patient. The patient was provided an opportunity to ask questions and all were answered. The patient agreed with the plan and demonstrated an understanding of the instructions.   The patient was advised to call back or seek an in-person evaluation if the symptoms worsen or if the condition fails to improve as anticipated.  Ursula Alert, MD   [facility] MD OP Progress Note  05/25/2019 6:01 PM Mary Koch  MRN:  [id number]  Chief Complaint:  Chief Complaint    Follow-up     HPI: Mary Koch is a 48 year old caucasian female , lives in [place] , has a history of MDD, bereavement, panic disorder, agoraphobia, tobacco use disorder, insomnia, caffeine use disorder was evaluated by telemedicine today.  Patient today reports that she has been feeling slightly anxious about the current pandemic that is going around.  She however is currently trying to cope.  She reports she is trying to be compliant on her medications as prescribed.  She reports she does not want any dose changes and wants to stay on the current dosage at this time.  She would like to continue psychotherapy sessions with her therapist.  She reports she is cutting back on smoking cigarettes.  She continues to be on Chantix.  Patient is also trying to cut back on her caffeine use.  She denies any other concerns today. Visit Diagnosis:    ICD-10-CM   1. MDD (major depressive disorder), recurrent episode, with atypical features (HCC) F33.9 QUEtiapine (SEROQUEL) 100 MG tablet   early  remission  2. Bereavement Z63.4   3. Panic disorder F41.0   4. Agoraphobia F40.00 gabapentin (NEURONTIN) 600 MG tablet  5. Tobacco use disorder F17.200 varenicline (CHANTIX) 1 MG tablet  6. Insomnia due to other mental disorder F51.05    F99   7. 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  Past trials of Paxil, Luvox, Klonopin, Zoloft, Seroquel, Wellbutrin.  Past Medical History:  Past Medical History:  Diagnosis Date  . Allergic rhinitis   . Anemia   . Anxiety   . Anxiety and depression   . Carpal tunnel syndrome   . Depression   . Fibroids   . GERD (gastroesophageal reflux disease)   . Headache(784.0)   . Headache(784.0)   . Heavy periods   . Hyperthyroidism    right portion of thyroid removed  . Muscle spasm   . Neurological disorder    evaluation for ms  . Painful menstrual periods   . Shortness of breath    when i smoke a lot    Past Surgical History:  Procedure Laterality Date  . BREAST BIOPSY Left    neg  . carpel tunnel Left 2017  . CESAREAN SECTION     times 2  . CHOLECYSTECTOMY    . THYROID LOBECTOMY      Family Psychiatric History: Reviewed family psychiatric history from my progress note on 03/24/2018.  Family History:  Family History  Problem Relation  Age of Onset  . Osteoporosis Mother   . Diabetes Father   . Anxiety disorder Father   . Depression Father   . Post-traumatic stress disorder Father   . Heart disease Father   . Anxiety disorder Sister   . Alcohol abuse Brother   . Breast cancer Neg Hx   . Colon cancer Neg Hx     Social History: Reviewed social history from my progress note on 03/24/2018. Social History   Socioeconomic History  . Marital status: Legally Separated    Spouse name: Not on file  . Number of children: Not on file  . Years of education: Not on file  . Highest education level: Not on file  Occupational History  . Not on  file  Social Needs  . Financial resource strain: Not on file  . Food insecurity:    Worry: Not on file    Inability: Not on file  . Transportation needs:    Medical: Not on file    Non-medical: Not on file  Tobacco Use  . Smoking status: Current Every Day Smoker    Packs/day: 0.25    Years: 25.00    Pack years: 6.25    Types: Cigarettes    Start date: 05/22/1986  . Smokeless tobacco: Never Used  . Tobacco comment: Has cut back to 4 a day with Chantix  Substance and Sexual Activity  . Alcohol use: No    Alcohol/week: 0.0 standard drinks  . Drug use: No  . Sexual activity: Yes    Partners: Male    Birth control/protection: Surgical  Lifestyle  . Physical activity:    Days per week: Not on file    Minutes per session: Not on file  . Stress: Not on file  Relationships  . Social connections:    Talks on phone: Not on file    Gets together: Not on file    Attends religious service: Not on file    Active member of club or organization: Not on file    Attends meetings of clubs or organizations: Not on file    Relationship status: Not on file  Other Topics Concern  . Not on file  Social History Narrative  . Not on file    Allergies:  Allergies  Allergen Reactions  . Azithromycin Other (See Comments)    headache  . Codeine Other (See Comments)    unknown  . Penicillins Other (See Comments)    headaches    Metabolic Disorder Labs: No results found for: HGBA1C, MPG No results found for: PROLACTIN No results found for: CHOL, TRIG, HDL, CHOLHDL, VLDL, LDLCALC No results found for: TSH  Therapeutic Level Labs: No results found for: LITHIUM No results found for: VALPROATE No components found for:  CBMZ  Current Medications: Current Outpatient Medications  Medication Sig Dispense Refill  . albuterol (PROVENTIL HFA;VENTOLIN HFA) 108 (90 Base) MCG/ACT inhaler INHALE 1 TO 2 PUFFS DAILY    . BIOTIN PO Take by mouth.    Marland Kitchen BLACK ELDERBERRY,BERRY-FLOWER, PO Take by  mouth.    . Cyanocobalamin (VITAMIN B 12 PO) Take by mouth.    . cyclobenzaprine (FLEXERIL) 10 MG tablet Take 10 mg by mouth 3 (three) times daily as needed for muscle spasms.    Marland Kitchen doxycycline (MONODOX) 100 MG capsule     . DULoxetine (CYMBALTA) 60 MG capsule TAKE 1 CAPSULE (60 MG TOTAL) BY MOUTH 2 (TWO) TIMES DAILY. 180 capsule 0  . fluticasone (FLONASE) 50 MCG/ACT nasal spray  Place 1 spray into both nostrils daily.  4  . Fluticasone Furoate-Vilanterol (BREO ELLIPTA) 100-25 MCG/INH AEPB INHALE 1 PUFF BY MOUTH ONCE DAILY    . furosemide (LASIX) 20 MG tablet TAKE 1 TABLET BY MOUTH TWICE A WEEK  5  . gabapentin (NEURONTIN) 600 MG tablet Take 1 tablet (600 mg total) by mouth 2 (two) times daily. 180 tablet 1  . medroxyPROGESTERone Acetate 150 MG/ML SUSY Inject 1 mL (150 mg total) into the muscle every 3 (three) months. 1 Syringe 0  . meloxicam (MOBIC) 7.5 MG tablet Take 7.5 mg by mouth daily.  0  . nystatin-triamcinolone (MYCOLOG II) cream Apply 1 application topically 2 (two) times daily. (Patient not taking: Reported on 04/07/2019) 30 g 1  . omeprazole (PRILOSEC) 40 MG capsule Take 40 mg by mouth daily.    . phentermine (ADIPEX-P) 37.5 MG tablet Take 37.5 mg by mouth daily.  2  . propranolol (INDERAL) 10 MG tablet TAKE 1 TABLET BY MOUTH THREE TIMES A DAY AS NEEDED 270 tablet 1  . QUEtiapine (SEROQUEL) 100 MG tablet TAKE 1 TABLET BY MOUTH EVERYDAY AT BEDTIME 90 tablet 1  . rosuvastatin (CRESTOR) 20 MG tablet Take 20 mg by mouth daily.    Marland Kitchen topiramate (TOPAMAX) 100 MG tablet TAKE 1.5 TABLETS (150 MG TOTAL) BY MOUTH DAILY. 135 tablet 1  . varenicline (CHANTIX) 1 MG tablet Take 1 tablet (1 mg total) by mouth 2 (two) times daily. 180 tablet 0   No current facility-administered medications for this visit.      Musculoskeletal: Strength & Muscle Tone: within normal limits Gait & Station: normal Patient leans: N/A  Psychiatric Specialty Exam: Review of Systems  Psychiatric/Behavioral: The patient  is nervous/anxious.   All other systems reviewed and are negative.   There were no vitals taken for this visit.There is no height or weight on file to calculate BMI.  General Appearance: Casual  Eye Contact:  Good  Speech:  Clear and Coherent and Normal Rate  Volume:  Normal  Mood:  Anxious  Affect:  Congruent  Thought Process:  Goal Directed and Descriptions of Associations: Intact  Orientation:  Full (Time, Place, and Person)  Thought Content: Logical   Suicidal Thoughts:  No  Homicidal Thoughts:  No  Memory:  Immediate;   Fair Recent;   Fair Remote;   Fair  Judgement:  Fair  Insight:  Fair  Psychomotor Activity:  Normal  Concentration:  Concentration: Fair and Attention Span: Fair  Recall:  [employer] of Knowledge: Fair  Language: Fair  Akathisia:  No  Handed:  Right  AIMS (if indicated): UTA  Assets:  Communication Skills Desire for Improvement Social Support  ADL's:  Intact  Cognition: WNL  Sleep:  Fair   Screenings: AUDIT     Office Visit from 05/22/2016 in [place]  Alcohol Use Disorder Identification Test Final Score (AUDIT)  0    PHQ2-9     Office Visit from 05/22/2016 in [place]  PHQ-2 Total Score  4  PHQ-9 Total Score  18       Assessment and Plan: Mary Koch is a 48 year old Caucasian female with history of anxiety, depression, bereavement, panic attack, was evaluated by telemedicine today.  Patient continues to have some anxiety symptoms although improving.  She will continue psychotherapy sessions as well as medications as noted below.  Plan For anxiety disorder-improving Cymbalta 60 mg p.o. twice daily Neurontin 600 mg p.o. twice daily Propranolol 10 mg p.o.  3 times daily as needed Continue CBT  Depressive symptoms-improving Seroquel 100 mg p.o. nightly Cymbalta 60 mg p.o. twice daily Topamax continued per neurologist.  She takes it for headaches.  For insomnia-stable Seroquel as  prescribed  For agoraphobia- stable Continue CBT  For tobacco use disorder-improving And takes as prescribed  For caffeine use disorder in early remission Continue to monitor closely.  For bereavement-improving She will continue psychotherapy sessions.  Follow-up in clinic in 2 months or sooner if needed.  July 29 at 4:30 PM  I have spent atleast 15 minutes non face to face with patient today. More than 50 % of the time was spent for psychoeducation and supportive psychotherapy and care coordination.  This note was generated in part or whole with voice recognition software. Voice recognition is usually quite accurate but there are transcription errors that can and very often do occur. I apologize for any typographical errors that were not detected and corrected.       Ursula Alert, MD 05/25/2019, 6:01 PM

## 2019-05-27 NOTE — Progress Notes (Signed)
Virtual Visit via Video Note  I connected with [name] on 05/14/19 at 11:00 AM EDT by a video enabled telemedicine application and verified that I am speaking with the correct person using two identifiers.  Location: Patient: home Provider: office    I discussed the limitations of evaluation and management by telemedicine and the availability of in person appointments. The patient expressed understanding and agreed to proceed.     Participation Level: Active  Type of Therapy: Individual Therapy  Treatment Goals addressed: Coping  Interventions: CBT and Motivational Interviewing  Summary: Mary Koch is a 48 y.o. female who presents with continued symptoms.   LCSW offered education about common irrational fears and beliefs that contribute to anxiety. Reviewed and showed client how to use CBT/REBT strategies to recognize and re-frame irrational fears and self-talk as a means of increasing client's capacity to handle their anxiety more constructively.   Suicidal/Homicidal: No  Plan: Return again in 2 weeks.  Diagnosis: Axis I: Generalized Anxiety Disorder    Axis II: No diagnosis     I discussed the assessment and treatment plan with the patient. The patient was provided an opportunity to ask questions and all were answered. The patient agreed with the plan and demonstrated an understanding of the instructions.   The patient was advised to call back or seek an in-person evaluation if the symptoms worsen or if the condition fails to improve as anticipated.  I provided 30 minutes of non-face-to-face time during this encounter.   Lubertha South, LCSW

## 2019-06-23 ENCOUNTER — Telehealth: Payer: Self-pay

## 2019-06-23 ENCOUNTER — Ambulatory Visit: Payer: Self-pay

## 2019-06-23 NOTE — Telephone Encounter (Signed)
Pt prescreened. No symptoms. Has face mask.  Coronavirus (COVID-19) Are you at risk?  Are you at risk for the Coronavirus (COVID-19)?  To be considered HIGH RISK for Coronavirus (COVID-19), you have to meet the following criteria:  . Traveled to [place], [place], [place], [place] or [place]; or in the [place] to [place], [place], [place], or [place]; and have fever, cough, and shortness of breath within the last 2 weeks of travel OR . Been in close contact with a person diagnosed with COVID-19 within the last 2 weeks and have fever, cough, and shortness of breath . IF YOU DO NOT MEET THESE CRITERIA, YOU ARE CONSIDERED LOW RISK FOR COVID-19.  What to do if you are HIGH RISK for COVID-19?  Marland Kitchen If you are having a medical emergency, call 911. . Seek medical care right away. Before you go to a doctor's office, urgent care or emergency department, call ahead and tell them about your recent travel, contact with someone diagnosed with COVID-19, and your symptoms. You should receive instructions from your physician's office regarding next steps of care.  . When you arrive at healthcare provider, tell the healthcare staff immediately you have returned from visiting [place], [place], [place], [place] or [place]; or traveled in the [place] to [place], [place], [place], or [place]; in the last two weeks or you have been in close contact with a person diagnosed with COVID-19 in the last 2 weeks.   . Tell the health care staff about your symptoms: fever, cough and shortness of breath. . After you have been seen by a medical provider, you will be either: o Tested for (COVID-19) and discharged home on quarantine except to seek medical care if symptoms worsen, and asked to  - Stay home and avoid contact with others until you get your results (4-5 days)  - Avoid travel on public transportation if possible (such as bus, train, or airplane) or o Sent to the Emergency Department by EMS for  evaluation, COVID-19 testing, and possible admission depending on your condition and test results.  What to do if you are LOW RISK for COVID-19?  Reduce your risk of any infection by using the same precautions used for avoiding the common cold or flu:  Marland Kitchen Wash your hands often with soap and warm water for at least 20 seconds.  If soap and water are not readily available, use an alcohol-based hand sanitizer with at least 60% alcohol.  . If coughing or sneezing, cover your mouth and nose by coughing or sneezing into the elbow areas of your shirt or coat, into a tissue or into your sleeve (not your hands). . Avoid shaking hands with others and consider head nods or verbal greetings only. . Avoid touching your eyes, nose, or mouth with unwashed hands.  . Avoid close contact with people who are sick. . Avoid places or events with large numbers of people in one location, like concerts or sporting events. . Carefully consider travel plans you have or are making. . If you are planning any travel outside or inside the [place], visit the CDC's Travelers' Health webpage for the latest health notices. . If you have some symptoms but not all symptoms, continue to monitor at home and seek medical attention if your symptoms worsen. . If you are having a medical emergency, call 911.   ADDITIONAL HEALTHCARE OPTIONS FOR PATIENTS  [facility] / e-Visit: [email or web address]  [facility]: [phone]  [facility]: [phone]                   [facility]: [phone]  

## 2019-06-24 ENCOUNTER — Other Ambulatory Visit: Payer: Self-pay | Admitting: Certified Nurse Midwife

## 2019-06-24 ENCOUNTER — Other Ambulatory Visit: Payer: Self-pay

## 2019-06-24 ENCOUNTER — Ambulatory Visit: Payer: Medicare HMO | Admitting: Obstetrics and Gynecology

## 2019-06-24 VITALS — BP 116/72 | HR 80 | Ht 66.5 in | Wt 253.6 lb

## 2019-06-24 DIAGNOSIS — Z3042 Encounter for surveillance of injectable contraceptive: Secondary | ICD-10-CM

## 2019-06-24 DIAGNOSIS — N921 Excessive and frequent menstruation with irregular cycle: Secondary | ICD-10-CM

## 2019-06-24 MED ORDER — MEDROXYPROGESTERONE ACETATE 150 MG/ML IM SUSP
150.0000 mg | Freq: Once | INTRAMUSCULAR | Status: AC
  Administered 2019-06-24: 150 mg via INTRAMUSCULAR

## 2019-06-24 NOTE — Progress Notes (Unsigned)
Date last pap: 10/30/2017. Last Depo-Provera: 04/07/19. Side Effects if any: none. Serum HCG indicated? n/a. Depo-Provera 150 mg IM given by: FH,LPN. Next appointment due Sept 10-24, 2020    BP 116/72   Pulse 80   Ht 5' 6.5" (1.689 m)   Wt 253 lb 9.6 oz (115 kg)   BMI 40.32 kg/m

## 2019-07-09 ENCOUNTER — Other Ambulatory Visit: Payer: Self-pay | Admitting: Psychiatry

## 2019-07-09 DIAGNOSIS — F410 Panic disorder [episodic paroxysmal anxiety] without agoraphobia: Secondary | ICD-10-CM

## 2019-07-20 ENCOUNTER — Encounter: Payer: Self-pay | Admitting: Certified Nurse Midwife

## 2019-07-28 ENCOUNTER — Other Ambulatory Visit: Payer: Self-pay

## 2019-07-28 ENCOUNTER — Ambulatory Visit (INDEPENDENT_AMBULATORY_CARE_PROVIDER_SITE_OTHER): Payer: Medicare HMO | Admitting: Psychiatry

## 2019-07-28 ENCOUNTER — Encounter: Payer: Self-pay | Admitting: Psychiatry

## 2019-07-28 DIAGNOSIS — F99 Mental disorder, not otherwise specified: Secondary | ICD-10-CM | POA: Diagnosis not present

## 2019-07-28 DIAGNOSIS — F17200 Nicotine dependence, unspecified, uncomplicated: Secondary | ICD-10-CM

## 2019-07-28 DIAGNOSIS — F339 Major depressive disorder, recurrent, unspecified: Secondary | ICD-10-CM | POA: Diagnosis not present

## 2019-07-28 DIAGNOSIS — F4000 Agoraphobia, unspecified: Secondary | ICD-10-CM | POA: Insufficient documentation

## 2019-07-28 DIAGNOSIS — Z634 Disappearance and death of family member: Secondary | ICD-10-CM

## 2019-07-28 DIAGNOSIS — F1590 Other stimulant use, unspecified, uncomplicated: Secondary | ICD-10-CM | POA: Diagnosis not present

## 2019-07-28 DIAGNOSIS — F410 Panic disorder [episodic paroxysmal anxiety] without agoraphobia: Secondary | ICD-10-CM | POA: Insufficient documentation

## 2019-07-28 DIAGNOSIS — F5105 Insomnia due to other mental disorder: Secondary | ICD-10-CM

## 2019-07-28 MED ORDER — VARENICLINE TARTRATE 1 MG PO TABS: 1.0000 mg | ORAL_TABLET | Freq: Two times a day (BID) | ORAL | 0 refills | Status: DC

## 2019-07-28 NOTE — Progress Notes (Signed)
Virtual Visit via Video Note  I connected with Mary Koch on 07/28/19 at  4:30 PM EDT by a video enabled telemedicine application and verified that I am speaking with the correct person using two identifiers.   I discussed the limitations of evaluation and management by telemedicine and the availability of in person appointments. The patient expressed understanding and agreed to proceed.   I discussed the assessment and treatment plan with the patient. The patient was provided an opportunity to ask questions and all were answered. The patient agreed with the plan and demonstrated an understanding of the instructions.   The patient was advised to call back or seek an in-person evaluation if the symptoms worsen or if the condition fails to improve as anticipated.   [facility] MD OP Progress Note  07/28/2019 5:22 PM Mary Koch  MRN:  [id number]  Chief Complaint:  Chief Complaint    Follow-up     HPI: Mary Koch is a 48 year old Caucasian female, lives in [place], has a history of MDD, bereavement, panic disorder, agoraphobia, tobacco use disorder, insomnia, caffeine use disorder was evaluated by telemedicine today.  Patient today reports she is currently doing well on the current medication regimen.  Patient denies any side effects to her medications.  She reports she has been following all the precautions for the COVID-19.  She reports she has been doing car side pickup for her groceries.  Her son continues to be homeschooled and he was already doing that anyway.  She continues to cut back on her caffeine and smoking cigarettes.  She continues to be on Chantix which helps.  Patient reports she does not have any other concerns at this time.  She denies any suicidality, homicidality or perceptual  disturbances. Visit Diagnosis:    ICD-10-CM   1. MDD (major depressive disorder), recurrent episode, with atypical features ([place])  F33.9   2. Bereavement  Z63.4   3. Panic disorder  F41.0   4.  Agoraphobia  F40.00   5. Tobacco use disorder  F17.200 varenicline (CHANTIX) 1 MG tablet  6. Insomnia due to other mental disorder  F51.05    F99   7. 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  Past trials of Paxil, Luvox, Klonopin, Zoloft, Seroquel, Wellbutrin.  Past Medical History:  Past Medical History:  Diagnosis Date  . Allergic rhinitis   . Anemia   . Anxiety   . Anxiety and depression   . Carpal tunnel syndrome   . Depression   . Fibroids   . GERD (gastroesophageal reflux disease)   . Headache(784.0)   . Headache(784.0)   . Heavy periods   . Hyperthyroidism    right portion of thyroid removed  . Muscle spasm   . Neurological disorder    evaluation for ms  . Painful menstrual periods   . Shortness of breath    when i smoke a lot    Past Surgical History:  Procedure Laterality Date  . BREAST BIOPSY Left    neg  . carpel tunnel Left 2017  . CESAREAN SECTION     times 2  . CHOLECYSTECTOMY    . THYROID LOBECTOMY      Family Psychiatric History: I have reviewed family psychiatric history from my progress note on 03/24/2018.  Family History:  Family History  Problem Relation Age of Onset  . Osteoporosis Mother   . Diabetes Father   .  Anxiety disorder Father   . Depression Father   . Post-traumatic stress disorder Father   . Heart disease Father   . Anxiety disorder Sister   . Alcohol abuse Brother   . Breast cancer Neg Hx   . Colon cancer Neg Hx     Social History: I have reviewed social history from my progress note on 03/24/2018. Social History   Socioeconomic History  . Marital status: Legally Separated    Spouse name: Not on file  . Number of children: Not on file  . Years of education: Not on file  . Highest education level: Not on file  Occupational History  . Not on file  Social Needs  . Financial resource strain: Not on file  . Food insecurity     Worry: Not on file    Inability: Not on file  . Transportation needs    Medical: Not on file    Non-medical: Not on file  Tobacco Use  . Smoking status: Current Every Day Smoker    Packs/day: 0.25    Years: 25.00    Pack years: 6.25    Types: Cigarettes    Start date: 05/22/1986  . Smokeless tobacco: Never Used  . Tobacco comment: Has cut back to 4 a day with Chantix  Substance and Sexual Activity  . Alcohol use: No    Alcohol/week: 0.0 standard drinks  . Drug use: No  . Sexual activity: Yes    Partners: Male    Birth control/protection: Surgical  Lifestyle  . Physical activity    Days per week: Not on file    Minutes per session: Not on file  . Stress: Not on file  Relationships  . Social Herbalist on phone: Not on file    Gets together: Not on file    Attends religious service: Not on file    Active member of club or organization: Not on file    Attends meetings of clubs or organizations: Not on file    Relationship status: Not on file  Other Topics Concern  . Not on file  Social History Narrative  . Not on file    Allergies:  Allergies  Allergen Reactions  . Azithromycin Other (See Comments)    headache  . Codeine Other (See Comments)    unknown  . Penicillins Other (See Comments)    headaches    Metabolic Disorder Labs: No results found for: HGBA1C, MPG No results found for: PROLACTIN No results found for: CHOL, TRIG, HDL, CHOLHDL, VLDL, LDLCALC No results found for: TSH  Therapeutic Level Labs: No results found for: LITHIUM No results found for: VALPROATE No components found for:  CBMZ  Current Medications: Current Outpatient Medications  Medication Sig Dispense Refill  . albuterol (PROVENTIL HFA;VENTOLIN HFA) 108 (90 Base) MCG/ACT inhaler INHALE 1 TO 2 PUFFS DAILY    . BIOTIN PO Take by mouth.    Marland Kitchen BLACK ELDERBERRY,BERRY-FLOWER, PO Take by mouth.    . Cyanocobalamin (VITAMIN B 12 PO) Take by mouth.    . cyclobenzaprine  (FLEXERIL) 10 MG tablet Take 10 mg by mouth 3 (three) times daily as needed for muscle spasms.    Marland Kitchen doxycycline (MONODOX) 100 MG capsule     . DULoxetine (CYMBALTA) 60 MG capsule TAKE 1 CAPSULE (60 MG TOTAL) BY MOUTH 2 (TWO) TIMES DAILY. 180 capsule 0  . fluticasone (FLONASE) 50 MCG/ACT nasal spray Place 1 spray into both nostrils daily.  4  . Fluticasone Furoate-Vilanterol (  BREO ELLIPTA) 100-25 MCG/INH AEPB INHALE 1 PUFF BY MOUTH ONCE DAILY    . furosemide (LASIX) 20 MG tablet TAKE 1 TABLET BY MOUTH TWICE A WEEK  5  . gabapentin (NEURONTIN) 600 MG tablet Take 1 tablet (600 mg total) by mouth 2 (two) times daily. 180 tablet 1  . medroxyPROGESTERone Acetate 150 MG/ML SUSY INJECT 1 ML (150 MG TOTAL) INTO THE MUSCLE EVERY 3 (THREE) MONTHS. 1 mL 0  . meloxicam (MOBIC) 7.5 MG tablet Take 7.5 mg by mouth daily.  0  . nystatin-triamcinolone (MYCOLOG II) cream Apply 1 application topically 2 (two) times daily. (Patient not taking: Reported on 04/07/2019) 30 g 1  . omeprazole (PRILOSEC) 40 MG capsule Take 40 mg by mouth daily.    . phentermine (ADIPEX-P) 37.5 MG tablet Take 37.5 mg by mouth daily.  2  . propranolol (INDERAL) 10 MG tablet TAKE 1 TABLET BY MOUTH THREE TIMES A DAY AS NEEDED 270 tablet 1  . QUEtiapine (SEROQUEL) 100 MG tablet TAKE 1 TABLET BY MOUTH EVERYDAY AT BEDTIME 90 tablet 1  . rosuvastatin (CRESTOR) 20 MG tablet Take 20 mg by mouth daily.    Marland Kitchen topiramate (TOPAMAX) 100 MG tablet TAKE 1.5 TABLETS (150 MG TOTAL) BY MOUTH DAILY. 135 tablet 1  . varenicline (CHANTIX) 1 MG tablet Take 1 tablet (1 mg total) by mouth 2 (two) times daily. 180 tablet 0   No current facility-administered medications for this visit.      Musculoskeletal: Strength & Muscle Tone: within normal limits Gait & Station: normal Patient leans: N/A  Psychiatric Specialty Exam: Review of Systems  Psychiatric/Behavioral: The patient is nervous/anxious.   All other systems reviewed and are negative.   There were no  vitals taken for this visit.There is no height or weight on file to calculate BMI.  General Appearance: Casual  Eye Contact:  Fair  Speech:  Clear and Coherent  Volume:  Normal  Mood:  Anxious coping well  Affect:  Appropriate  Thought Process:  Goal Directed and Descriptions of Associations: Intact  Orientation:  Full (Time, Place, and Person)  Thought Content: Logical   Suicidal Thoughts:  No  Homicidal Thoughts:  No  Memory:  Immediate;   Fair Recent;   Fair Remote;   Fair  Judgement:  Fair  Insight:  Fair  Psychomotor Activity:  Normal  Concentration:  Concentration: Fair and Attention Span: Fair  Recall:  [employer] of Knowledge: Fair  Language: Fair  Akathisia:  No  Handed:  Right  AIMS (if indicated): denies tremors, rigidity  Assets:  Communication Skills Desire for Improvement Social Support Talents/Skills  ADL's:  Intact  Cognition: WNL  Sleep:  Fair   Screenings: AUDIT     Office Visit from 05/22/2016 in [facility]  Alcohol Use Disorder Identification Test Final Score (AUDIT)  0    PHQ2-9     Office Visit from 05/22/2016 in [facility]  PHQ-2 Total Score  4  PHQ-9 Total Score  18       Assessment and Plan: Jaysa is a 48 year old Caucasian female with history of anxiety, depression, bereavement, panic attack was evaluated by telemedicine today.  Patient is currently doing well on the current medication regimen.  Plan For anxiety disorder-stable Cymbalta 60 mg p.o. twice daily Neurontin 600 mg p.o. twice daily Propranolol 10 mg p.o. 3 times daily as needed  For depressive symptoms-stable Seroquel 100 mg p.o. nightly Cymbalta 60 mg p.o. twice daily Topamax continued per  neurology.  She takes it for headaches.  For insomnia-stable Seroquel as prescribed  For agoraphobia-stable Continue CBT  For tobacco use disorder-stable-improving Continue Chantix.  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  Follow-up in clinic in 2 to 3 months or sooner if needed.  October 29 at 2:30 PM  I have spent atleast 15 minutes non face to face with patient today. More than 50 % of the time was spent for psychoeducation and supportive psychotherapy and care coordination.  This note was generated in part or whole with voice recognition software. Voice recognition is usually quite accurate but there are transcription errors that can and very often do occur. I apologize for any typographical errors that were not detected and corrected.       Ursula Alert, MD 07/28/2019, 5:22 PM

## 2019-08-10 ENCOUNTER — Other Ambulatory Visit: Payer: Self-pay

## 2019-08-10 ENCOUNTER — Ambulatory Visit (INDEPENDENT_AMBULATORY_CARE_PROVIDER_SITE_OTHER): Payer: Medicare HMO | Admitting: Certified Nurse Midwife

## 2019-08-10 ENCOUNTER — Encounter: Payer: Self-pay | Admitting: Certified Nurse Midwife

## 2019-08-10 VITALS — BP 70/49 | HR 93 | Ht 67.5 in | Wt 257.3 lb

## 2019-08-10 DIAGNOSIS — Z1231 Encounter for screening mammogram for malignant neoplasm of breast: Secondary | ICD-10-CM | POA: Diagnosis not present

## 2019-08-10 DIAGNOSIS — N921 Excessive and frequent menstruation with irregular cycle: Secondary | ICD-10-CM

## 2019-08-10 DIAGNOSIS — Z3042 Encounter for surveillance of injectable contraceptive: Secondary | ICD-10-CM

## 2019-08-10 DIAGNOSIS — K210 Gastro-esophageal reflux disease with esophagitis: Secondary | ICD-10-CM | POA: Diagnosis not present

## 2019-08-10 DIAGNOSIS — E669 Obesity, unspecified: Secondary | ICD-10-CM | POA: Diagnosis not present

## 2019-08-10 DIAGNOSIS — Z01419 Encounter for gynecological examination (general) (routine) without abnormal findings: Secondary | ICD-10-CM | POA: Diagnosis not present

## 2019-08-10 DIAGNOSIS — Z6839 Body mass index (BMI) 39.0-39.9, adult: Secondary | ICD-10-CM | POA: Diagnosis not present

## 2019-08-10 DIAGNOSIS — F17200 Nicotine dependence, unspecified, uncomplicated: Secondary | ICD-10-CM

## 2019-08-10 DIAGNOSIS — E8881 Metabolic syndrome: Secondary | ICD-10-CM | POA: Diagnosis not present

## 2019-08-10 DIAGNOSIS — R0602 Shortness of breath: Secondary | ICD-10-CM | POA: Diagnosis not present

## 2019-08-10 MED ORDER — MEDROXYPROGESTERONE ACETATE 150 MG/ML IM SUSY: 150.0000 mg | PREFILLED_SYRINGE | INTRAMUSCULAR | 3 refills | Status: DC

## 2019-08-10 NOTE — Progress Notes (Signed)
ANNUAL PREVENTATIVE CARE GYN  ENCOUNTER NOTE  Subjective:       Mary Koch is a 48 y.o. [id number] female here for a routine annual gynecologic exam.  No questions or concerns.   Denies difficulty breathing or respiratory distress, chest pain, abdominal pain, vaginal bleeding, dysuria, and leg pain or swelling.    Gynecologic History  No LMP recorded. Patient has had an injection.  Contraception: Depo-Provera injections and tubal ligation  Last Pap: 10/2017. Results were: Negative/Negative  Last mammogram: 10/2016. Results were: normal  Obstetric History  OB History  Gravida Para Term Preterm AB Living  2 2 2     2   SAB TAB Ectopic Multiple Live Births          2    # Outcome Date GA Lbr Len/2nd Weight Sex Delivery Anes PTL Lv  2 Term 04/18/09   7 lb 1.8 oz (3.225 kg) M CS-LTranv Spinal  LIV  1 Term 06/08/02   7 lb 6.4 oz (3.357 kg) M CS-LTranv EPI  LIV     Complications: Failure to Progress in Second Stage    Past Medical History:  Diagnosis Date  . Allergic rhinitis   . Anemia   . Anxiety   . Anxiety and depression   . Carpal tunnel syndrome   . Depression   . Fibroids   . GERD (gastroesophageal reflux disease)   . Headache(784.0)   . Headache(784.0)   . Heavy periods   . Hyperthyroidism    right portion of thyroid removed  . Muscle spasm   . Neurological disorder    evaluation for ms  . Painful menstrual periods   . Shortness of breath    when i smoke a lot    Past Surgical History:  Procedure Laterality Date  . BREAST BIOPSY Left    neg  . carpel tunnel Left 2017  . CESAREAN SECTION     times 2  . CHOLECYSTECTOMY    . THYROID LOBECTOMY      Current Outpatient Medications on File Prior to Visit  Medication Sig Dispense Refill  . albuterol (PROVENTIL HFA;VENTOLIN HFA) 108 (90 Base) MCG/ACT inhaler INHALE 1 TO 2 PUFFS DAILY    . BIOTIN PO Take by mouth.    Marland Kitchen BLACK ELDERBERRY,BERRY-FLOWER, PO Take by mouth.    . cyclobenzaprine (FLEXERIL) 10 MG  tablet Take 10 mg by mouth 3 (three) times daily as needed for muscle spasms.    . DULoxetine (CYMBALTA) 60 MG capsule TAKE 1 CAPSULE (60 MG TOTAL) BY MOUTH 2 (TWO) TIMES DAILY. 180 capsule 0  . fluticasone (FLONASE) 50 MCG/ACT nasal spray Place 1 spray into both nostrils daily.  4  . furosemide (LASIX) 20 MG tablet TAKE 1 TABLET BY MOUTH TWICE A WEEK  5  . gabapentin (NEURONTIN) 600 MG tablet Take 1 tablet (600 mg total) by mouth 2 (two) times daily. 180 tablet 1  . meloxicam (MOBIC) 7.5 MG tablet Take 7.5 mg by mouth daily.  0  . omeprazole (PRILOSEC) 40 MG capsule Take 40 mg by mouth daily.    . propranolol (INDERAL) 10 MG tablet TAKE 1 TABLET BY MOUTH THREE TIMES A DAY AS NEEDED 270 tablet 1  . QUEtiapine (SEROQUEL) 100 MG tablet TAKE 1 TABLET BY MOUTH EVERYDAY AT BEDTIME 90 tablet 1  . rosuvastatin (CRESTOR) 20 MG tablet Take 20 mg by mouth daily.    Marland Kitchen topiramate (TOPAMAX) 100 MG tablet TAKE 1.5 TABLETS (150 MG TOTAL) BY MOUTH DAILY. [place]  tablet 1   No current facility-administered medications on file prior to visit.     Allergies  Allergen Reactions  . Azithromycin Other (See Comments)    headache  . Codeine Other (See Comments)    unknown  . Penicillins Other (See Comments)    headaches    Social History   Socioeconomic History  . Marital status: Legally Separated    Spouse name: Not on file  . Number of children: Not on file  . Years of education: Not on file  . Highest education level: Not on file  Occupational History  . Not on file  Social Needs  . Financial resource strain: Not on file  . Food insecurity    Worry: Not on file    Inability: Not on file  . Transportation needs    Medical: Not on file    Non-medical: Not on file  Tobacco Use  . Smoking status: Current Every Day Smoker    Packs/day: 0.25    Years: 25.00    Pack years: 6.25    Types: Cigarettes    Start date: 05/22/1986  . Smokeless tobacco: Never Used  . Tobacco comment: Has cut back to 4 a  day with Chantix  Substance and Sexual Activity  . Alcohol use: No    Alcohol/week: 0.0 standard drinks  . Drug use: No  . Sexual activity: Yes    Partners: Male    Birth control/protection: Surgical, Injection  Lifestyle  . Physical activity    Days per week: Not on file    Minutes per session: Not on file  . Stress: Not on file  Relationships  . Social Herbalist on phone: Not on file    Gets together: Not on file    Attends religious service: Not on file    Active member of club or organization: Not on file    Attends meetings of clubs or organizations: Not on file    Relationship status: Not on file  . Intimate partner violence    Fear of current or ex partner: Not on file    Emotionally abused: Not on file    Physically abused: Not on file    Forced sexual activity: Not on file  Other Topics Concern  . Not on file  Social History Narrative  . Not on file    Family History  Problem Relation Age of Onset  . Osteoporosis Mother   . Diabetes Father   . Anxiety disorder Father   . Depression Father   . Post-traumatic stress disorder Father   . Heart disease Father   . Anxiety disorder Sister   . Alcohol abuse Brother   . Breast cancer Neg Hx   . Colon cancer Neg Hx   . Ovarian cancer Neg Hx     The following portions of the patient's history were reviewed and updated as appropriate: allergies, current medications, past family history, past medical history, past social history, past surgical history and problem list.  Review of Systems  ROS negative except as noted above. Information obtained from patient.    Objective:   BP (!) 70/49   Pulse 93   Ht 5' 7.5" (1.715 m)   Wt 257 lb 4.8 oz (116.7 kg)   BMI 39.70 kg/m    CONSTITUTIONAL: Well-developed, well-nourished female in no acute distress.   PSYCHIATRIC: Normal mood and affect. Normal behavior. Normal  judgment and thought content.  [place]: Alert and oriented to person, place, and  time.  Normal muscle tone coordination. No cranial nerve deficit noted.  HENT:  Normocephalic, atraumatic, External right and left ear normal.   EYES: Conjunctivae and EOM are normal. Pupils are equal and round.   NECK: Normal range of motion, supple, no masses.  Normal thyroid.   SKIN: Skin is warm and dry. No rash noted. Not diaphoretic. No erythema. No pallor.  CARDIOVASCULAR: Normal heart rate noted, regular rhythm, no murmur.  RESPIRATORY: Clear to auscultation bilaterally. Effort and breath sounds normal, no problems with respiration noted.  BREASTS: Symmetric in size. No masses, skin changes, nipple drainage, or lymphadenopathy.  ABDOMEN: Soft, normal bowel sounds, no distention noted.  No tenderness, rebound or guarding. Obese.   PELVIC:  External Genitalia: Normal  Vagina: Normal  Cervix: Normal  Uterus: Normal  Adnexa: Normal  RV: External hemorrhoid present    MUSCULOSKELETAL: Normal range of motion. No tenderness.  No cyanosis, or clubbing.  2+ distal pulses. Trace edema.   LYMPHATIC: No Axillary, Supraclavicular, or Inguinal Adenopathy.  Depression screen PHQ 2/9 08/10/2019  Decreased Interest 2  Down, Depressed, Hopeless 1  PHQ - 2 Score 3  Altered sleeping 3  Tired, decreased energy 1  Change in appetite 3  Feeling bad or failure about yourself  1  Trouble concentrating 2  Moving slowly or fidgety/restless 2  Suicidal thoughts 0  PHQ-9 Score 15  Difficult doing work/chores Somewhat difficult  Some encounter information is confidential and restricted. Go to Review Flowsheets activity to see all data.   GAD 7 : Generalized Anxiety Score 08/10/2019  Nervous, Anxious, on Edge 1  Control/stop worrying 2  Worry too much - different things 2  Trouble relaxing 3  Restless 2  Easily annoyed or irritable 3  Afraid - awful might happen 1  Total GAD 7 Score 14  Anxiety Difficulty Somewhat difficult   Assessment:   Annual gynecologic examination 48 y.o.    Contraception: Depo-Provera injections and tubal ligation   Obesity 2   Problem List Items Addressed This Visit      Other   Excess, menstruation   Relevant Medications   medroxyPROGESTERone Acetate 150 MG/ML SUSY    Other Visit Diagnoses    Well woman exam    -  Primary   Relevant Orders   MM 3D SCREEN BREAST BILATERAL   Depo-Provera contraceptive status       Encounter for screening mammogram for breast cancer       Relevant Orders   MM 3D SCREEN BREAST BILATERAL   Smoker       BMI 39.0-39.9,adult          Plan:   Pap: Not needed   Mammogram: Ordered  Labs: Declined  Routine preventative health maintenance measures emphasized: Smoking cessation, Exercise/Diet/Weight control, Tobacco Warnings, Alcohol/Substance use risks and Stress Management; see AVS  Reviewed red flag symptoms and when to call  RTC x 1 year for ANNUAL EXAM or sooner if needed   Diona Fanti, CNM [facility], [facility] 08/10/19 6:32 PM

## 2019-08-10 NOTE — Patient Instructions (Signed)
Hemorrhoids Hemorrhoids are swollen veins in and around the rectum or anus. There are two types of hemorrhoids:  Internal hemorrhoids. These occur in the veins that are just inside the rectum. They may poke through to the outside and become irritated and painful.  External hemorrhoids. These occur in the veins that are outside the anus and can be felt as a painful swelling or hard lump near the anus. Most hemorrhoids do not cause serious problems, and they can be managed with home treatments such as diet and lifestyle changes. If home treatments do not help the symptoms, procedures can be done to shrink or remove the hemorrhoids. What are the causes? This condition is caused by increased pressure in the anal area. This pressure may result from various things, including:  Constipation.  Straining to have a bowel movement.  Diarrhea.  Pregnancy.  Obesity.  Sitting for long periods of time.  Heavy lifting or other activity that causes you to strain.  Anal sex.  Riding a bike for a long period of time. What are the signs or symptoms? Symptoms of this condition include:  Pain.  Anal itching or irritation.  Rectal bleeding.  Leakage of stool (feces).  Anal swelling.  One or more lumps around the anus. How is this diagnosed? This condition can often be diagnosed through a visual exam. Other exams or tests may also be done, such as:  An exam that involves feeling the rectal area with a gloved hand (digital rectal exam).  An exam of the anal canal that is done using a small tube (anoscope).  A blood test, if you have lost a significant amount of blood.  A test to look inside the colon using a flexible tube with a camera on the end (sigmoidoscopy or colonoscopy). How is this treated? This condition can usually be treated at home. However, various procedures may be done if dietary changes, lifestyle changes, and other home treatments do not help your symptoms. These  procedures can help make the hemorrhoids smaller or remove them completely. Some of these procedures involve surgery, and others do not. Common procedures include:  Rubber band ligation. Rubber bands are placed at the base of the hemorrhoids to cut off their blood supply.  Sclerotherapy. Medicine is injected into the hemorrhoids to shrink them.  Infrared coagulation. A type of light energy is used to get rid of the hemorrhoids.  Hemorrhoidectomy surgery. The hemorrhoids are surgically removed, and the veins that supply them are tied off.  Stapled hemorrhoidopexy surgery. The surgeon staples the base of the hemorrhoid to the rectal wall. Follow these instructions at home: Eating and drinking   Eat foods that have a lot of fiber in them, such as whole grains, beans, nuts, fruits, and vegetables.  Ask your health care provider about taking products that have added fiber (fiber supplements).  Reduce the amount of fat in your diet. You can do this by eating low-fat dairy products, eating less red meat, and avoiding processed foods.  Drink enough fluid to keep your urine pale yellow. Managing pain and swelling   Take warm sitz baths for 20 minutes, 3-4 times a day to ease pain and discomfort. You may do this in a bathtub or using a portable sitz bath that fits over the toilet.  If directed, apply ice to the affected area. Using ice packs between sitz baths may be helpful. ? Put ice in a plastic bag. ? Place a towel between your skin and the bag. ? Leave  the ice on for 20 minutes, 2-3 times a day. General instructions  Take over-the-counter and prescription medicines only as told by your health care provider.  Use medicated creams or suppositories as told.  Get regular exercise. Ask your health care provider how much and what kind of exercise is best for you. In general, you should do moderate exercise for at least 30 minutes on most days of the week (150 minutes each week). This can  include activities such as walking, biking, or yoga.  Go to the bathroom when you have the urge to have a bowel movement. Do not wait.  Avoid straining to have bowel movements.  Keep the anal area dry and clean. Use wet toilet paper or moist towelettes after a bowel movement.  Do not sit on the toilet for long periods of time. This increases blood pooling and pain.  Keep all follow-up visits as told by your health care provider. This is important. Contact a health care provider if you have:  Increasing pain and swelling that are not controlled by treatment or medicine.  Difficulty having a bowel movement, or you are unable to have a bowel movement.  Pain or inflammation outside the area of the hemorrhoids. Get help right away if you have:  Uncontrolled bleeding from your rectum. Summary  Hemorrhoids are swollen veins in and around the rectum or anus.  Most hemorrhoids can be managed with home treatments such as diet and lifestyle changes.  Taking warm sitz baths can help ease pain and discomfort.  In severe cases, procedures or surgery can be done to shrink or remove the hemorrhoids. This information is not intended to replace advice given to you by your health care provider. Make sure you discuss any questions you have with your health care provider. Document Released: 12/13/2000 Document Revised: 12/24/2018 Document Reviewed: 05/07/2018 Elsevier Patient Education  [place]. Medroxyprogesterone injection [Contraceptive] What is this medicine? MEDROXYPROGESTERONE (me DROX ee proe JES te rone) contraceptive injections prevent pregnancy. They provide effective birth control for 3 months. Depo-subQ Provera 104 is also used for treating pain related to endometriosis. This medicine may be used for other purposes; ask your health care provider or pharmacist if you have questions. COMMON BRAND NAME(S): Depo-Provera, Depo-subQ Provera 104 What should I tell my health care  provider before I take this medicine? They need to know if you have any of these conditions:  frequently drink alcohol  asthma  blood vessel disease or a history of a blood clot in the lungs or legs  bone disease such as osteoporosis  breast cancer  diabetes  eating disorder (anorexia nervosa or bulimia)  high blood pressure  HIV infection or AIDS  kidney disease  liver disease  mental depression  migraine  seizures (convulsions)  stroke  tobacco smoker  vaginal bleeding  an unusual or allergic reaction to medroxyprogesterone, other hormones, medicines, foods, dyes, or preservatives  pregnant or trying to get pregnant  breast-feeding How should I use this medicine? Depo-Provera Contraceptive injection is given into a muscle. Depo-subQ Provera 104 injection is given under the skin. These injections are given by a health care professional. You must not be pregnant before getting an injection. The injection is usually given during the first 5 days after the start of a menstrual period or 6 weeks after delivery of a baby. Talk to your pediatrician regarding the use of this medicine in children. Special care may be needed. These injections have been used in female children who  have started having menstrual periods. Overdosage: If you think you have taken too much of this medicine contact a poison control center or emergency room at once. NOTE: This medicine is only for you. Do not share this medicine with others. What if I miss a dose? Try not to miss a dose. You must get an injection once every 3 months to maintain birth control. If you cannot keep an appointment, call and reschedule it. If you wait longer than 13 weeks between Depo-Provera contraceptive injections or longer than 14 weeks between Depo-subQ Provera 104 injections, you could get pregnant. Use another method for birth control if you miss your appointment. You may also need a pregnancy test before receiving  another injection. What may interact with this medicine? Do not take this medicine with any of the following medications:  bosentan This medicine may also interact with the following medications:  aminoglutethimide  antibiotics or medicines for infections, especially rifampin, rifabutin, rifapentine, and griseofulvin  aprepitant  barbiturate medicines such as phenobarbital or primidone  bexarotene  carbamazepine  medicines for seizures like ethotoin, felbamate, oxcarbazepine, phenytoin, topiramate  modafinil  St. John's wort This list may not describe all possible interactions. Give your health care provider a list of all the medicines, herbs, non-prescription drugs, or dietary supplements you use. Also tell them if you smoke, drink alcohol, or use illegal drugs. Some items may interact with your medicine. What should I watch for while using this medicine? This drug does not protect you against HIV infection (AIDS) or other sexually transmitted diseases. Use of this product may cause you to lose calcium from your bones. Loss of calcium may cause weak bones (osteoporosis). Only use this product for more than 2 years if other forms of birth control are not right for you. The longer you use this product for birth control the more likely you will be at risk for weak bones. Ask your health care professional how you can keep strong bones. You may have a change in bleeding pattern or irregular periods. Many females stop having periods while taking this drug. If you have received your injections on time, your chance of being pregnant is very low. If you think you may be pregnant, see your health care professional as soon as possible. Tell your health care professional if you want to get pregnant within the next year. The effect of this medicine may last a long time after you get your last injection. What side effects may I notice from receiving this medicine? Side effects that you should  report to your doctor or health care professional as soon as possible:  allergic reactions like skin rash, itching or hives, swelling of the face, lips, or tongue  breast tenderness or discharge  breathing problems  changes in vision  depression  feeling faint or lightheaded, falls  fever  pain in the abdomen, chest, groin, or leg  problems with balance, talking, walking  unusually weak or tired  yellowing of the eyes or skin Side effects that usually do not require medical attention (report to your doctor or health care professional if they continue or are bothersome):  acne  fluid retention and swelling  headache  irregular periods, spotting, or absent periods  temporary pain, itching, or skin reaction at site where injected  weight gain This list may not describe all possible side effects. Call your doctor for medical advice about side effects. You may report side effects to FDA at 1-800-FDA-1088. Where should I keep my medicine?  This does not apply. The injection will be given to you by a health care professional. NOTE: This sheet is a summary. It may not cover all possible information. If you have questions about this medicine, talk to your doctor, pharmacist, or health care provider.  2020 Elsevier/Gold Standard (2009-01-06 18:37:56) Preventive Care 37-21 Years Old, Female Preventive care refers to visits with your health care provider and lifestyle choices that can promote health and wellness. This includes:  A yearly physical exam. This may also be called an annual well check.  Regular dental visits and eye exams.  Immunizations.  Screening for certain conditions.  Healthy lifestyle choices, such as eating a healthy diet, getting regular exercise, not using drugs or products that contain nicotine and tobacco, and limiting alcohol use. What can I expect for my preventive care visit? Physical exam Your health care provider will check your:  Height and  weight. This may be used to calculate body mass index (BMI), which tells if you are at a healthy weight.  Heart rate and blood pressure.  Skin for abnormal spots. Counseling Your health care provider may ask you questions about your:  Alcohol, tobacco, and drug use.  Emotional well-being.  Home and relationship well-being.  Sexual activity.  Eating habits.  Work and work Statistician.  Method of birth control.  Menstrual cycle.  Pregnancy history. What immunizations do I need?  Influenza (flu) vaccine  This is recommended every year. Tetanus, diphtheria, and pertussis (Tdap) vaccine  You may need a Td booster every 10 years. Varicella (chickenpox) vaccine  You may need this if you have not been vaccinated. Zoster (shingles) vaccine  You may need this after age 38. Measles, mumps, and rubella (MMR) vaccine  You may need at least one dose of MMR if you were born in 1957 or later. You may also need a second dose. Pneumococcal conjugate (PCV13) vaccine  You may need this if you have certain conditions and were not previously vaccinated. Pneumococcal polysaccharide (PPSV23) vaccine  You may need one or two doses if you smoke cigarettes or if you have certain conditions. Meningococcal conjugate (MenACWY) vaccine  You may need this if you have certain conditions. Hepatitis A vaccine  You may need this if you have certain conditions or if you travel or work in places where you may be exposed to hepatitis A. Hepatitis B vaccine  You may need this if you have certain conditions or if you travel or work in places where you may be exposed to hepatitis B. Haemophilus influenzae type b (Hib) vaccine  You may need this if you have certain conditions. Human papillomavirus (HPV) vaccine  If recommended by your health care provider, you may need three doses over 6 months. You may receive vaccines as individual doses or as more than one vaccine together in one shot  (combination vaccines). Talk with your health care provider about the risks and benefits of combination vaccines. What tests do I need? Blood tests  Lipid and cholesterol levels. These may be checked every 5 years, or more frequently if you are over 38 years old.  Hepatitis C test.  Hepatitis B test. Screening  Lung cancer screening. You may have this screening every year starting at age 35 if you have a 30-pack-year history of smoking and currently smoke or have quit within the past 15 years.  Colorectal cancer screening. All adults should have this screening starting at age 78 and continuing until age 52. Your health care provider may recommend  screening at age 61 if you are at increased risk. You will have tests every 1-10 years, depending on your results and the type of screening test.  Diabetes screening. This is done by checking your blood sugar (glucose) after you have not eaten for a while (fasting). You may have this done every 1-3 years.  Mammogram. This may be done every 1-2 years. Talk with your health care provider about when you should start having regular mammograms. This may depend on whether you have a family history of breast cancer.  BRCA-related cancer screening. This may be done if you have a family history of breast, ovarian, tubal, or peritoneal cancers.  Pelvic exam and Pap test. This may be done every 3 years starting at age 55. Starting at age 47, this may be done every 5 years if you have a Pap test in combination with an HPV test. Other tests  Sexually transmitted disease (STD) testing.  Bone density scan. This is done to screen for osteoporosis. You may have this scan if you are at high risk for osteoporosis. Follow these instructions at home: Eating and drinking  Eat a diet that includes fresh fruits and vegetables, whole grains, lean protein, and low-fat dairy.  Take vitamin and mineral supplements as recommended by your health care provider.  Do not  drink alcohol if: ? Your health care provider tells you not to drink. ? You are pregnant, may be pregnant, or are planning to become pregnant.  If you drink alcohol: ? Limit how much you have to 0-1 drink a day. ? Be aware of how much alcohol is in your drink. In the [place] one drink equals one 12 oz bottle of beer (355 mL), one 5 oz glass of wine (148 mL), or one 1 oz glass of hard liquor (44 mL). Lifestyle  Take daily care of your teeth and gums.  Stay active. Exercise for at least 30 minutes on 5 or more days each week.  Do not use any products that contain nicotine or tobacco, such as cigarettes, e-cigarettes, and chewing tobacco. If you need help quitting, ask your health care provider.  If you are sexually active, practice safe sex. Use a condom or other form of birth control (contraception) in order to prevent pregnancy and STIs (sexually transmitted infections).  If told by your health care provider, take low-dose aspirin daily starting at age 15. What's next?  Visit your health care provider once a year for a well check visit.  Ask your health care provider how often you should have your eyes and teeth checked.  Stay up to date on all vaccines. This information is not intended to replace advice given to you by your health care provider. Make sure you discuss any questions you have with your health care provider. Document Released: 01/12/2016 Document Revised: 08/27/2018 Document Reviewed: 08/27/2018 Elsevier Patient Education  2020 [employer].

## 2019-08-10 NOTE — Progress Notes (Signed)
Patient here for annual exam, no complaints.  

## 2019-09-10 ENCOUNTER — Other Ambulatory Visit: Payer: Self-pay

## 2019-09-10 ENCOUNTER — Ambulatory Visit (INDEPENDENT_AMBULATORY_CARE_PROVIDER_SITE_OTHER): Payer: Medicare HMO | Admitting: Certified Nurse Midwife

## 2019-09-10 VITALS — BP 119/77 | HR 102 | Ht 66.5 in | Wt 254.4 lb

## 2019-09-10 DIAGNOSIS — Z3042 Encounter for surveillance of injectable contraceptive: Secondary | ICD-10-CM | POA: Diagnosis not present

## 2019-09-10 MED ORDER — MEDROXYPROGESTERONE ACETATE 150 MG/ML IM SUSP
150.0000 mg | Freq: Once | INTRAMUSCULAR | Status: AC
  Administered 2019-09-10: 150 mg via INTRAMUSCULAR

## 2019-09-10 NOTE — Progress Notes (Signed)
Date last pap: 10/30/2017. Last Depo-Provera: 06/24/19. Side Effects if any: none. Serum HCG indicated? n/a. Depo-Provera 150 mg IM given by: Carren Rang. Next appointment due November 27-December 10, 2019.    BP 119/77   Pulse (!) 102   Ht 5' 6.5" (1.689 m)   Wt 254 lb 6.4 oz (115.4 kg)   BMI 40.45 kg/m

## 2019-09-10 NOTE — Patient Instructions (Signed)
Medroxyprogesterone injection [Contraceptive] What is this medicine? MEDROXYPROGESTERONE (me DROX ee proe JES te rone) contraceptive injections prevent pregnancy. They provide effective birth control for 3 months. Depo-subQ Provera 104 is also used for treating pain related to endometriosis. This medicine may be used for other purposes; ask your health care provider or pharmacist if you have questions. COMMON BRAND NAME(S): Depo-Provera, Depo-subQ Provera 104 What should I tell my health care provider before I take this medicine? They need to know if you have any of these conditions:  frequently drink alcohol  asthma  blood vessel disease or a history of a blood clot in the lungs or legs  bone disease such as osteoporosis  breast cancer  diabetes  eating disorder (anorexia nervosa or bulimia)  high blood pressure  HIV infection or AIDS  kidney disease  liver disease  mental depression  migraine  seizures (convulsions)  stroke  tobacco smoker  vaginal bleeding  an unusual or allergic reaction to medroxyprogesterone, other hormones, medicines, foods, dyes, or preservatives  pregnant or trying to get pregnant  breast-feeding How should I use this medicine? Depo-Provera Contraceptive injection is given into a muscle. Depo-subQ Provera 104 injection is given under the skin. These injections are given by a health care professional. You must not be pregnant before getting an injection. The injection is usually given during the first 5 days after the start of a menstrual period or 6 weeks after delivery of a baby. Talk to your pediatrician regarding the use of this medicine in children. Special care may be needed. These injections have been used in female children who have started having menstrual periods. Overdosage: If you think you have taken too much of this medicine contact a poison control center or emergency room at once. NOTE: This medicine is only for you. Do not  share this medicine with others. What if I miss a dose? Try not to miss a dose. You must get an injection once every 3 months to maintain birth control. If you cannot keep an appointment, call and reschedule it. If you wait longer than 13 weeks between Depo-Provera contraceptive injections or longer than 14 weeks between Depo-subQ Provera 104 injections, you could get pregnant. Use another method for birth control if you miss your appointment. You may also need a pregnancy test before receiving another injection. What may interact with this medicine? Do not take this medicine with any of the following medications:  bosentan This medicine may also interact with the following medications:  aminoglutethimide  antibiotics or medicines for infections, especially rifampin, rifabutin, rifapentine, and griseofulvin  aprepitant  barbiturate medicines such as phenobarbital or primidone  bexarotene  carbamazepine  medicines for seizures like ethotoin, felbamate, oxcarbazepine, phenytoin, topiramate  modafinil  St. John's wort This list may not describe all possible interactions. Give your health care provider a list of all the medicines, herbs, non-prescription drugs, or dietary supplements you use. Also tell them if you smoke, drink alcohol, or use illegal drugs. Some items may interact with your medicine. What should I watch for while using this medicine? This drug does not protect you against HIV infection (AIDS) or other sexually transmitted diseases. Use of this product may cause you to lose calcium from your bones. Loss of calcium may cause weak bones (osteoporosis). Only use this product for more than 2 years if other forms of birth control are not right for you. The longer you use this product for birth control the more likely you will be at risk   for weak bones. Ask your health care professional how you can keep strong bones. You may have a change in bleeding pattern or irregular periods.  Many females stop having periods while taking this drug. If you have received your injections on time, your chance of being pregnant is very low. If you think you may be pregnant, see your health care professional as soon as possible. Tell your health care professional if you want to get pregnant within the next year. The effect of this medicine may last a long time after you get your last injection. What side effects may I notice from receiving this medicine? Side effects that you should report to your doctor or health care professional as soon as possible:  allergic reactions like skin rash, itching or hives, swelling of the face, lips, or tongue  breast tenderness or discharge  breathing problems  changes in vision  depression  feeling faint or lightheaded, falls  fever  pain in the abdomen, chest, groin, or leg  problems with balance, talking, walking  unusually weak or tired  yellowing of the eyes or skin Side effects that usually do not require medical attention (report to your doctor or health care professional if they continue or are bothersome):  acne  fluid retention and swelling  headache  irregular periods, spotting, or absent periods  temporary pain, itching, or skin reaction at site where injected  weight gain This list may not describe all possible side effects. Call your doctor for medical advice about side effects. You may report side effects to FDA at 1-800-FDA-1088. Where should I keep my medicine? This does not apply. The injection will be given to you by a health care professional. NOTE: This sheet is a summary. It may not cover all possible information. If you have questions about this medicine, talk to your doctor, pharmacist, or health care provider.  2020 Elsevier/Gold Standard (2009-01-06 18:37:56)  

## 2019-09-23 DIAGNOSIS — Z20828 Contact with and (suspected) exposure to other viral communicable diseases: Secondary | ICD-10-CM | POA: Diagnosis not present

## 2019-10-18 ENCOUNTER — Other Ambulatory Visit: Payer: Self-pay | Admitting: Psychiatry

## 2019-10-18 DIAGNOSIS — F17200 Nicotine dependence, unspecified, uncomplicated: Secondary | ICD-10-CM

## 2019-10-28 ENCOUNTER — Ambulatory Visit (INDEPENDENT_AMBULATORY_CARE_PROVIDER_SITE_OTHER): Payer: Medicare HMO | Admitting: Psychiatry

## 2019-10-28 ENCOUNTER — Encounter: Payer: Self-pay | Admitting: Psychiatry

## 2019-10-28 ENCOUNTER — Other Ambulatory Visit: Payer: Self-pay

## 2019-10-28 DIAGNOSIS — F17200 Nicotine dependence, unspecified, uncomplicated: Secondary | ICD-10-CM | POA: Diagnosis not present

## 2019-10-28 DIAGNOSIS — Z634 Disappearance and death of family member: Secondary | ICD-10-CM

## 2019-10-28 DIAGNOSIS — F410 Panic disorder [episodic paroxysmal anxiety] without agoraphobia: Secondary | ICD-10-CM

## 2019-10-28 DIAGNOSIS — F4000 Agoraphobia, unspecified: Secondary | ICD-10-CM | POA: Diagnosis not present

## 2019-10-28 DIAGNOSIS — F339 Major depressive disorder, recurrent, unspecified: Secondary | ICD-10-CM

## 2019-10-28 MED ORDER — DULOXETINE HCL 60 MG PO CPEP: 60.0000 mg | ORAL_CAPSULE | Freq: Two times a day (BID) | ORAL | 0 refills | Status: DC

## 2019-10-28 MED ORDER — QUETIAPINE FUMARATE 100 MG PO TABS: ORAL_TABLET | ORAL | 1 refills | Status: DC

## 2019-10-28 MED ORDER — PROPRANOLOL HCL 10 MG PO TABS: 10.0000 mg | ORAL_TABLET | Freq: Three times a day (TID) | ORAL | 1 refills | Status: DC | PRN

## 2019-10-28 MED ORDER — BUPROPION HCL 100 MG PO TABS: 100.0000 mg | ORAL_TABLET | Freq: Every day | ORAL | 1 refills | Status: DC

## 2019-10-28 NOTE — Progress Notes (Signed)
Virtual Visit via Video Note  I connected with Mary Koch on 10/28/19 at  2:30 PM EDT by a video enabled telemedicine application and verified that I am speaking with the correct person using two identifiers.   I discussed the limitations of evaluation and management by telemedicine and the availability of in person appointments. The patient expressed understanding and agreed to proceed.   I discussed the assessment and treatment plan with the patient. The patient was provided an opportunity to ask questions and all were answered. The patient agreed with the plan and demonstrated an understanding of the instructions.   The patient was advised to call back or seek an in-person evaluation if the symptoms worsen or if the condition fails to improve as anticipated.   [facility] [facility] MD OP Progress Note  10/28/2019 5:25 PM Mary Koch  MRN:  [id number]  Chief Complaint:  Chief Complaint    Follow-up     HPI: Mary Koch is a 48 year old Caucasian female, lives in [place], has a history of MDD, bereavement, panic disorder, agoraphobia, tobacco use disorder, insomnia, caffeine use disorder was evaluated by telemedicine today.  A video call was initiated however due to connection problem it had to be changed to a phone call during the session.  Patient today reports she is currently struggling with some sadness, crying spells, lack of motivation since the past few days.  She reports as the holidays are getting closer she is missing her father more.  He passed away 1-1/2 years ago in February.  She reports it is difficult for her to stop grieving over his loss.  She reports sleep is okay.  She however reports she continues to struggle with some fatigue and tiredness during the day.  She does report having a sleep study done several years ago which was within normal limits.  Patient denies any suicidality, homicidality or perceptual disturbances.  Patient reports she is currently working on stopping smoking.   She has been able to cut back.  She is on Chantix.  She is tolerating it well.  She is compliant on her medications.  She denies any side effects.  She denies any other concerns today.  Visit Diagnosis:    ICD-10-CM   1. MDD (major depressive disorder), recurrent episode, with atypical features ([place])  F33.9 buPROPion (WELLBUTRIN) 100 MG tablet  2. Bereavement  Z63.4 QUEtiapine (SEROQUEL) 100 MG tablet  3. Panic disorder  F41.0 DULoxetine (CYMBALTA) 60 MG capsule    propranolol (INDERAL) 10 MG tablet  4. Agoraphobia  F40.00   5. Tobacco use disorder  F17.200     Past Psychiatric History: I have reviewed past psychiatric history from my progress note on 03/24/2018.  Past trials of Paxil, Luvox, Klonopin, Zoloft, Seroquel, Wellbutrin.  Past Medical History:  Past Medical History:  Diagnosis Date  . Allergic rhinitis   . Anemia   . Anxiety   . Anxiety and depression   . Carpal tunnel syndrome   . Depression   . Fibroids   . GERD (gastroesophageal reflux disease)   . Headache(784.0)   . Headache(784.0)   . Heavy periods   . Hyperthyroidism    right portion of thyroid removed  . Muscle spasm   . Neurological disorder    evaluation for ms  . Painful menstrual periods   . Shortness of breath    when i smoke a lot    Past Surgical History:  Procedure Laterality Date  . BREAST BIOPSY Left    neg  .  carpel tunnel Left 2017  . CESAREAN SECTION     times 2  . CHOLECYSTECTOMY    . THYROID LOBECTOMY      Family Psychiatric History: I have reviewed family psychiatric history from my progress note on 03/24/2018.  Family History:  Family History  Problem Relation Age of Onset  . Osteoporosis Mother   . Diabetes Father   . Anxiety disorder Father   . Depression Father   . Post-traumatic stress disorder Father   . Heart disease Father   . Anxiety disorder Sister   . Alcohol abuse Brother   . Breast cancer Neg Hx   . Colon cancer Neg Hx   . Ovarian cancer Neg Hx      Social History: I have reviewed social history from my progress note on 03/24/2018. Social History   Socioeconomic History  . Marital status: Legally Separated    Spouse name: Not on file  . Number of children: Not on file  . Years of education: Not on file  . Highest education level: Not on file  Occupational History  . Not on file  Social Needs  . Financial resource strain: Not on file  . Food insecurity    Worry: Not on file    Inability: Not on file  . Transportation needs    Medical: Not on file    Non-medical: Not on file  Tobacco Use  . Smoking status: Current Every Day Smoker    Packs/day: 0.25    Years: 25.00    Pack years: 6.25    Types: Cigarettes    Start date: 05/22/1986  . Smokeless tobacco: Never Used  . Tobacco comment: Has cut back to 4 a day with Chantix  Substance and Sexual Activity  . Alcohol use: No    Alcohol/week: 0.0 standard drinks  . Drug use: No  . Sexual activity: Yes    Partners: Male    Birth control/protection: Surgical, Injection  Lifestyle  . Physical activity    Days per week: Not on file    Minutes per session: Not on file  . Stress: Not on file  Relationships  . Social Herbalist on phone: Not on file    Gets together: Not on file    Attends religious service: Not on file    Active member of club or organization: Not on file    Attends meetings of clubs or organizations: Not on file    Relationship status: Not on file  Other Topics Concern  . Not on file  Social History Narrative  . Not on file    Allergies:  Allergies  Allergen Reactions  . Azithromycin Other (See Comments)    headache  . Codeine Other (See Comments)    unknown  . Penicillins Other (See Comments)    headaches    Metabolic Disorder Labs: No results found for: HGBA1C, MPG No results found for: PROLACTIN No results found for: CHOL, TRIG, HDL, CHOLHDL, VLDL, LDLCALC No results found for: TSH  Therapeutic Level Labs: No results  found for: LITHIUM No results found for: VALPROATE No components found for:  CBMZ  Current Medications: Current Outpatient Medications  Medication Sig Dispense Refill  . albuterol (PROVENTIL HFA;VENTOLIN HFA) 108 (90 Base) MCG/ACT inhaler INHALE 1 TO 2 PUFFS DAILY    . BLACK ELDERBERRY,BERRY-FLOWER, PO Take by mouth.    . CHANTIX CONTINUING MONTH PAK 1 MG tablet TAKE 1 TABLET BY MOUTH TWICE A DAY 168 tablet 0  .  cyclobenzaprine (FLEXERIL) 10 MG tablet Take 10 mg by mouth 3 (three) times daily as needed for muscle spasms.    . DULoxetine (CYMBALTA) 60 MG capsule Take 1 capsule (60 mg total) by mouth 2 (two) times daily. 180 capsule 0  . fluticasone (FLONASE) 50 MCG/ACT nasal spray Place 1 spray into both nostrils daily.  4  . furosemide (LASIX) 20 MG tablet TAKE 1 TABLET BY MOUTH TWICE A WEEK  5  . gabapentin (NEURONTIN) 600 MG tablet Take 1 tablet (600 mg total) by mouth 2 (two) times daily. 180 tablet 1  . medroxyPROGESTERone Acetate 150 MG/ML SUSY Inject 1 mL (150 mg total) into the muscle every 3 (three) months. 1 mL 3  . meloxicam (MOBIC) 7.5 MG tablet Take 7.5 mg by mouth daily.  0  . omeprazole (PRILOSEC) 40 MG capsule Take 40 mg by mouth daily.    . propranolol (INDERAL) 10 MG tablet Take 1 tablet (10 mg total) by mouth 3 (three) times daily as needed. 270 tablet 1  . QUEtiapine (SEROQUEL) 100 MG tablet TAKE 1 TABLET BY MOUTH EVERYDAY AT BEDTIME 90 tablet 1  . rosuvastatin (CRESTOR) 20 MG tablet Take 20 mg by mouth daily.    Marland Kitchen topiramate (TOPAMAX) 100 MG tablet TAKE 1.5 TABLETS (150 MG TOTAL) BY MOUTH DAILY. 135 tablet 1  . BIOTIN PO Take by mouth.    Marland Kitchen buPROPion (WELLBUTRIN) 100 MG tablet Take 1 tablet (100 mg total) by mouth daily with breakfast. 30 tablet 1   No current facility-administered medications for this visit.      Musculoskeletal: Strength & Muscle Tone: UTA Gait & Station: Reports as WNL Patient leans: N/A  Psychiatric Specialty Exam: Review of Systems   Constitutional: Positive for malaise/fatigue.  Psychiatric/Behavioral: Positive for depression.  All other systems reviewed and are negative.   There were no vitals taken for this visit.There is no height or weight on file to calculate BMI.  General Appearance: UTA  Eye Contact:  UTA  Speech:  Clear and Coherent  Volume:  Normal  Mood:  Depressed  Affect:  UTA  Thought Process:  Goal Directed and Descriptions of Associations: Intact  Orientation:  Full (Time, Place, and Person)  Thought Content: Logical   Suicidal Thoughts:  No  Homicidal Thoughts:  No  Memory:  Immediate;   Fair Recent;   Fair Remote;   Fair  Judgement:  Fair  Insight:  Fair  Psychomotor Activity:  UTA  Concentration:  Concentration: Fair and Attention Span: Fair  Recall:  [employer] of Knowledge: Fair  Language: Fair  Akathisia:  No  Handed:  Right  AIMS (if indicated): Denies tremors, rigidity  Assets:  Communication Skills Desire for Improvement Housing Social Support  ADL's:  Intact  Cognition: WNL  Sleep:  Fair   Screenings: AUDIT     Office Visit from 05/22/2016 in [facility]  Alcohol Use Disorder Identification Test Final Score (AUDIT)  0    GAD-7     Office Visit from 08/10/2019 in [facility]  Total GAD-7 Score  14    PHQ2-9     Office Visit from 08/10/2019 in [facility] Visit from 05/22/2016 in [facility]  PHQ-2 Total Score  3  4  PHQ-9 Total Score  15  18       Assessment and Plan: Coleene is a 48 year old Caucasian female with history of anxiety, depression, bereavement, panic attack was evaluated by telemedicine today.  Patient today reports she continues to grieve the loss of her father and has been struggling with some depressive symptoms lately.  She will benefit from medication readjustment.  Plan as noted below.  Plan Panic attacks -stable Cymbalta 60 mg p.o. twice daily Neurontin 600 mg  p.o. twice daily Propranolol 10 mg p.o. 3 times daily as needed  Depressive symptoms-unstable Seroquel 100 mg p.o. nightly Cymbalta 60 mg p.o. twice daily Start Wellbutrin 100 mg p.o. daily.  Insomnia-stable Seroquel 100 mg p.o. nightly will help.  Agoraphobia-stable Continue CBT  Tobacco use disorder-improving Continue Chantix Provided smoking cessation counseling. 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  December 17 at 4 PM  I have spent atleast 15 minutes non face to face with patient today. More than 50 % of the time was spent for psychoeducation and supportive psychotherapy and care coordination. This note was generated in part or whole with voice recognition software. Voice recognition is usually quite accurate but there are transcription errors that can and very often do occur. I apologize for any typographical errors that were not detected and corrected.        Ursula Alert, MD 10/28/2019, 5:25 PM

## 2019-11-02 ENCOUNTER — Other Ambulatory Visit: Payer: Self-pay

## 2019-11-02 ENCOUNTER — Ambulatory Visit: Payer: Medicare HMO | Admitting: Licensed Clinical Social Worker

## 2019-11-05 DIAGNOSIS — G5603 Carpal tunnel syndrome, bilateral upper limbs: Secondary | ICD-10-CM | POA: Diagnosis not present

## 2019-11-05 DIAGNOSIS — G43119 Migraine with aura, intractable, without status migrainosus: Secondary | ICD-10-CM | POA: Diagnosis not present

## 2019-11-05 DIAGNOSIS — F5104 Psychophysiologic insomnia: Secondary | ICD-10-CM | POA: Diagnosis not present

## 2019-11-09 ENCOUNTER — Other Ambulatory Visit: Payer: Self-pay

## 2019-11-09 ENCOUNTER — Telehealth: Payer: Self-pay

## 2019-11-09 ENCOUNTER — Ambulatory Visit (INDEPENDENT_AMBULATORY_CARE_PROVIDER_SITE_OTHER): Payer: Medicare HMO | Admitting: Licensed Clinical Social Worker

## 2019-11-09 DIAGNOSIS — F339 Major depressive disorder, recurrent, unspecified: Secondary | ICD-10-CM | POA: Diagnosis not present

## 2019-11-09 NOTE — Telephone Encounter (Signed)
Tc on 11-09-19 @ 2:36 to [facility] clinic neuro- gave the ok to change medication.

## 2019-11-09 NOTE — Telephone Encounter (Signed)
dr. Sunday Corn wanted to know if she can changed the patient propranolol to 20mg  bid

## 2019-11-09 NOTE — Telephone Encounter (Signed)
Please let them know its ok to increase propranolol. thanks

## 2019-11-14 NOTE — Progress Notes (Signed)
   THERAPIST PROGRESS NOTE  Session Time: 30  Participation Level: Active  Type of Therapy: Individual Therapy  Treatment Goals addressed: Coping  Interventions: CBT and Motivational Interviewing  Summary: Mary Koch is a 48 y.o. female who presents with symptoms of her diagnosis.   TL briefly checked-in with Patient regarding her day. Prompted Patient to discuss how her mood has been since their last session. Employed active listening as Patient discussed her mood fluctuations in the past week. Utilized OARS to gather information pertaining to Patient 's adherence to her medication regiment. Provided Patient with psycho education on her medication and explained the safety issues related to her taking more of her medication than prescribed. Assisted Patient with identifying alternative ways of coping with her distressing emotions. Utilized MI to assess the advances and barriers with Patient's ability to refrain from engaging in negative coping mechanisms (i.e. substance use) when experiencing distressing emotions. TL utilized CBT techniques to process Patient 's fears pertaining to relapse.   Suicidal/Homicidal: No  Plan: Return again in 2 weeks.  Diagnosis: Axis I: Depressive Disorder NOS    Axis II: No diagnosis    Lubertha South, LCSW 11/09/2019

## 2019-11-23 ENCOUNTER — Ambulatory Visit (INDEPENDENT_AMBULATORY_CARE_PROVIDER_SITE_OTHER): Payer: Medicare HMO | Admitting: Licensed Clinical Social Worker

## 2019-11-23 ENCOUNTER — Other Ambulatory Visit: Payer: Self-pay

## 2019-11-23 ENCOUNTER — Other Ambulatory Visit: Payer: Self-pay | Admitting: Psychiatry

## 2019-11-23 DIAGNOSIS — F339 Major depressive disorder, recurrent, unspecified: Secondary | ICD-10-CM

## 2019-11-28 NOTE — Progress Notes (Signed)
No contact.

## 2019-11-29 ENCOUNTER — Other Ambulatory Visit: Payer: Self-pay

## 2019-11-29 ENCOUNTER — Ambulatory Visit (INDEPENDENT_AMBULATORY_CARE_PROVIDER_SITE_OTHER): Payer: Medicare HMO | Admitting: Obstetrics and Gynecology

## 2019-11-29 VITALS — BP 74/44 | HR 100 | Ht 66.5 in | Wt 260.1 lb

## 2019-11-29 DIAGNOSIS — Z3042 Encounter for surveillance of injectable contraceptive: Secondary | ICD-10-CM | POA: Diagnosis not present

## 2019-11-29 MED ORDER — MEDROXYPROGESTERONE ACETATE 150 MG/ML IM SUSP
150.0000 mg | Freq: Once | INTRAMUSCULAR | Status: AC
  Administered 2019-11-29: 150 mg via INTRAMUSCULAR

## 2019-11-29 NOTE — Progress Notes (Signed)
Date last pap: 10/30/2017 (Negative/Negative). Last Depo-Provera: 09/10/2019. Side Effects if any: None per patient. Serum HCG indicated? N/A, patient is within dates. Depo-Provera 150 mg IM given by: Jennye Moccasin, CMA. Next appointment due 2/15-02/28/2020.  BP (!) 74/44   Pulse 100   Ht 5' 6.5" (1.689 m)   Wt 260 lb 1.6 oz (118 kg)   BMI 41.35 kg/m

## 2019-12-16 ENCOUNTER — Ambulatory Visit (INDEPENDENT_AMBULATORY_CARE_PROVIDER_SITE_OTHER): Payer: Medicare HMO | Admitting: Psychiatry

## 2019-12-16 ENCOUNTER — Encounter: Payer: Self-pay | Admitting: Psychiatry

## 2019-12-16 ENCOUNTER — Other Ambulatory Visit: Payer: Self-pay

## 2019-12-16 DIAGNOSIS — F4000 Agoraphobia, unspecified: Secondary | ICD-10-CM

## 2019-12-16 DIAGNOSIS — F5105 Insomnia due to other mental disorder: Secondary | ICD-10-CM | POA: Diagnosis not present

## 2019-12-16 DIAGNOSIS — F410 Panic disorder [episodic paroxysmal anxiety] without agoraphobia: Secondary | ICD-10-CM | POA: Diagnosis not present

## 2019-12-16 DIAGNOSIS — F99 Mental disorder, not otherwise specified: Secondary | ICD-10-CM

## 2019-12-16 DIAGNOSIS — F339 Major depressive disorder, recurrent, unspecified: Secondary | ICD-10-CM | POA: Diagnosis not present

## 2019-12-16 DIAGNOSIS — F1590 Other stimulant use, unspecified, uncomplicated: Secondary | ICD-10-CM | POA: Diagnosis not present

## 2019-12-16 DIAGNOSIS — F17200 Nicotine dependence, unspecified, uncomplicated: Secondary | ICD-10-CM

## 2019-12-16 MED ORDER — GABAPENTIN 600 MG PO TABS: 600.0000 mg | ORAL_TABLET | Freq: Two times a day (BID) | ORAL | 1 refills | Status: DC

## 2019-12-16 MED ORDER — BUPROPION HCL ER (SR) 100 MG PO TB12: 100.0000 mg | ORAL_TABLET | Freq: Two times a day (BID) | ORAL | 0 refills | Status: DC

## 2019-12-16 MED ORDER — QUETIAPINE FUMARATE 50 MG PO TABS: ORAL_TABLET | ORAL | 1 refills | Status: DC

## 2019-12-16 NOTE — Progress Notes (Signed)
Virtual Visit via Video Note  I connected with Mary Koch on 12/16/19 at  4:00 PM EST by a video enabled telemedicine application and verified that I am speaking with the correct person using two identifiers.   I discussed the limitations of evaluation and management by telemedicine and the availability of in person appointments. The patient expressed understanding and agreed to proceed.   I discussed the assessment and treatment plan with the patient. The patient was provided an opportunity to ask questions and all were answered. The patient agreed with the plan and demonstrated an understanding of the instructions.   The patient was advised to call back or seek an in-person evaluation if the symptoms worsen or if the condition fails to improve as anticipated.  [place] MD OP Progress Note  12/16/2019 2:47 PM [place]  MRN:  [id number]  Chief Complaint:  Chief Complaint    Follow-up     HPI: Mary Koch is a 48 year old Caucasian female who lives in [place], has a history of MDD, bereavement, panic disorder, agoraphobia, tobacco use disorder, insomnia, caffeine use disorder was evaluated by telemedicine today.  Patient today reports the bupropion has been helpful with her mood symptoms.  She has noticed an improvement in her depressive symptoms like sadness, crying spells and lack of motivation.  Patient denies any side effects to the Wellbutrin.  Patient reports her appetite is fair.  Patient denies any suicidality, homicidality or perceptual disturbances.  Patient reports sleep is okay however it can get restless since she has to get up in the middle of the night to help her dog who needs assistance.  She has been compliant on Seroquel as prescribed.  Patient reports she is currently working on smoking cessation.  She is currently compliant on Chantix.  She also continues to cut back on caffeinated drinks.  Patient continues to work with her therapist.  She reports she is coping  with her grief better than before.  Patient denies any other concerns today. Visit Diagnosis:    ICD-10-CM   1. MDD (major depressive disorder), recurrent episode, with atypical features ([facility])  F33.9 buPROPion (WELLBUTRIN SR) 100 MG 12 hr tablet  2. Panic disorder  F41.0 QUEtiapine (SEROQUEL) 50 MG tablet  3. Agoraphobia  F40.00   4. Insomnia due to other mental disorder  F51.05    F99   5. Tobacco use disorder  F17.200   6. 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  Past trials of Paxil, Luvox, Klonopin, Zoloft, Seroquel, Wellbutrin.  Past Medical History:  Past Medical History:  Diagnosis Date  . Allergic rhinitis   . Anemia   . Anxiety   . Anxiety and depression   . Carpal tunnel syndrome   . Depression   . Fibroids   . GERD (gastroesophageal reflux disease)   . Headache(784.0)   . Headache(784.0)   . Heavy periods   . Hyperthyroidism    right portion of thyroid removed  . Muscle spasm   . Neurological disorder    evaluation for ms  . Painful menstrual periods   . Shortness of breath    when i smoke a lot    Past Surgical History:  Procedure Laterality Date  . BREAST BIOPSY Left    neg  . carpel tunnel Left 2017  . CESAREAN SECTION     times 2  . CHOLECYSTECTOMY    . THYROID  LOBECTOMY      Family Psychiatric History: I have reviewed family psychiatric history from my progress note on 03/24/2018.  Family History:  Family History  Problem Relation Age of Onset  . Osteoporosis Mother   . Diabetes Father   . Anxiety disorder Father   . Depression Father   . Post-traumatic stress disorder Father   . Heart disease Father   . Anxiety disorder Sister   . Alcohol abuse Brother   . Breast cancer Neg Hx   . Colon cancer Neg Hx   . Ovarian cancer Neg Hx     Social History: Reviewed social history from my progress note on 03/24/2018. Social History   Socioeconomic  History  . Marital status: Legally Separated    Spouse name: Not on file  . Number of children: Not on file  . Years of education: Not on file  . Highest education level: Not on file  Occupational History  . Not on file  Tobacco Use  . Smoking status: Current Every Day Smoker    Packs/day: 0.25    Years: 25.00    Pack years: 6.25    Types: Cigarettes    Start date: 05/22/1986  . Smokeless tobacco: Never Used  . Tobacco comment: Has cut back to 4 a day with Chantix  Substance and Sexual Activity  . Alcohol use: No    Alcohol/week: 0.0 standard drinks  . Drug use: No  . Sexual activity: Yes    Partners: Male    Birth control/protection: Surgical, Injection  Other Topics Concern  . Not on file  Social History Narrative  . Not on file   Social Determinants of Health   Financial Resource Strain:   . Difficulty of Paying Living Expenses: Not on file  Food Insecurity:   . Worried About Charity fundraiser in the Last Year: Not on file  . Ran Out of Food in the Last Year: Not on file  Transportation Needs:   . Lack of Transportation (Medical): Not on file  . Lack of Transportation (Non-Medical): Not on file  Physical Activity:   . Days of Exercise per Week: Not on file  . Minutes of Exercise per Session: Not on file  Stress:   . Feeling of Stress : Not on file  Social Connections:   . Frequency of Communication with Friends and Family: Not on file  . Frequency of Social Gatherings with Friends and Family: Not on file  . Attends Religious Services: Not on file  . Active Member of Clubs or Organizations: Not on file  . Attends Archivist Meetings: Not on file  . Marital Status: Not on file    Allergies:  Allergies  Allergen Reactions  . Azithromycin Other (See Comments)    headache  . Codeine Other (See Comments)    unknown  . Penicillins Other (See Comments)    headaches    Metabolic Disorder Labs: No results found for: HGBA1C, MPG No results found  for: PROLACTIN No results found for: CHOL, TRIG, HDL, CHOLHDL, VLDL, LDLCALC No results found for: TSH  Therapeutic Level Labs: No results found for: LITHIUM No results found for: VALPROATE No components found for:  CBMZ  Current Medications: Current Outpatient Medications  Medication Sig Dispense Refill  . AFLURIA QUADRIVALENT 0.5 ML injection     . albuterol (PROVENTIL HFA;VENTOLIN HFA) 108 (90 Base) MCG/ACT inhaler INHALE 1 TO 2 PUFFS DAILY    . BOOSTRIX 5-2.5-18.5 LF-MCG/0.5 injection     .  BREO ELLIPTA 100-25 MCG/INH AEPB     . buPROPion (WELLBUTRIN SR) 100 MG 12 hr tablet Take 1 tablet (100 mg total) by mouth 2 (two) times daily. 180 tablet 0  . CHANTIX CONTINUING MONTH PAK 1 MG tablet TAKE 1 TABLET BY MOUTH TWICE A DAY 168 tablet 0  . cyclobenzaprine (FLEXERIL) 10 MG tablet Take 10 mg by mouth 3 (three) times daily as needed for muscle spasms.    . DULoxetine (CYMBALTA) 60 MG capsule Take 1 capsule (60 mg total) by mouth 2 (two) times daily. 180 capsule 0  . FLUBLOK QUADRIVALENT 0.5 ML injection     . fluticasone (FLONASE) 50 MCG/ACT nasal spray Place 1 spray into both nostrils daily.  4  . furosemide (LASIX) 20 MG tablet TAKE 1 TABLET BY MOUTH TWICE A WEEK  5  . gabapentin (NEURONTIN) 600 MG tablet Take 1 tablet (600 mg total) by mouth 2 (two) times daily. 180 tablet 1  . medroxyPROGESTERone Acetate 150 MG/ML SUSY Inject 1 mL (150 mg total) into the muscle every 3 (three) months. 1 mL 3  . meloxicam (MOBIC) 7.5 MG tablet Take 7.5 mg by mouth daily.  0  . omeprazole (PRILOSEC) 40 MG capsule Take 40 mg by mouth daily.    . propranolol (INDERAL) 10 MG tablet Take 1 tablet (10 mg total) by mouth 3 (three) times daily as needed. 270 tablet 1  . QUEtiapine (SEROQUEL) 50 MG tablet TAKE 1 TABLET BY MOUTH EVERYDAY AT BEDTIME- patient has supplies 30 tablet 1  . rosuvastatin (CRESTOR) 20 MG tablet Take 20 mg by mouth daily.    Marland Kitchen topiramate (TOPAMAX) 100 MG tablet TAKE 1.5 TABLETS (150 MG  TOTAL) BY MOUTH DAILY. 135 tablet 1   No current facility-administered medications for this visit.     Musculoskeletal: Strength & Muscle Tone: UTA Gait & Station: normal Patient leans: N/A  Psychiatric Specialty Exam: Review of Systems  Psychiatric/Behavioral: Positive for dysphoric mood.  All other systems reviewed and are negative.   There were no vitals taken for this visit.There is no height or weight on file to calculate BMI.  General Appearance: Casual  Eye Contact:  Fair  Speech:  Clear and Coherent  Volume:  Normal  Mood:  Depressed Improving  Affect:  Congruent  Thought Process:  Goal Directed and Descriptions of Associations: Intact  Orientation:  Full (Time, Place, and Person)  Thought Content: Logical   Suicidal Thoughts:  No  Homicidal Thoughts:  No  Memory:  Immediate;   Fair Recent;   Fair Remote;   Fair  Judgement:  Fair  Insight:  Fair  Psychomotor Activity:  Normal  Concentration:  Concentration: Fair and Attention Span: Fair  Recall:  [employer] of Knowledge: Fair  Language: Fair  Akathisia:  No  Handed:  Right  AIMS (if indicated): denies tremors, rigidity  Assets:  Communication Skills Desire for Improvement Housing Social Support  ADL's:  Intact  Cognition: WNL  Sleep:  Fair   Screenings: AUDIT     Office Visit from 05/22/2016 in [facility] [place]  Alcohol Use Disorder Identification Test Final Score (AUDIT)  0    GAD-7     Office Visit from 08/10/2019 in [facility]  Total GAD-7 Score  14    PHQ2-9     Office Visit from 08/10/2019 in [facility] Visit from 05/22/2016 in [facility]  PHQ-2 Total Score  3  4  PHQ-9 Total Score  15  18       Assessment and Plan: Suda is a 48 year old Caucasian female with history of panic attacks, MDD, tobacco use disorder, caffeine use disorder, insomnia was evaluated by telemedicine today.  Patient is currently making  progress on the current medication regimen.  Plan as noted below.  Plan MDD-improving We will continue to increase Wellbutrin since she is tolerating it well.  Change Wellbutrin to Wellbutrin SR 100 mg p.o. twice daily Cymbalta 60 mg p.o. twice daily Neurontin 600 mg p.o. twice daily Reduce Seroquel to 50 mg p.o. nightly.  Dosage being reduced due to side effect profile, patient being overweight.  If she does well on the Wellbutrin will continue to taper her off of the Seroquel.  Panic attacks-stable Cymbalta 60 mg p.o. twice daily Propanolol 10 mg p.o. 3 times daily as needed Gabapentin 600 mg p.o. 3 times daily  Insomnia-stable Seroquel as prescribed  Agoraphobia-stable Continue CBT  Tobacco use disorder-improving Chantix as prescribed Provided smoking cessation counseling.  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  Provided counseling.  Discussed with patient to get the following labs-TSH, lipid panel, hemoglobin A1c.  She will talk to her primary care provider.  Patient advised to continue CBT with Ms. [name].  Follow-up in clinic in 6 weeks or sooner if needed.  January 25 at 4 PM  I have spent atleast 15 minutes non  face to face with patient today. More than 50 % of the time was spent for psychoeducation and supportive psychotherapy and care coordination. This note was generated in part or whole with voice recognition software. Voice recognition is usually quite accurate but there are transcription errors that can and very often do occur. I apologize for any typographical errors that were not detected and corrected.        Ursula Alert, MD 12/16/2019, 2:47 PM

## 2020-01-13 DIAGNOSIS — Z20828 Contact with and (suspected) exposure to other viral communicable diseases: Secondary | ICD-10-CM | POA: Diagnosis not present

## 2020-01-15 DIAGNOSIS — U071 COVID-19: Secondary | ICD-10-CM

## 2020-01-15 HISTORY — DX: COVID-19: U07.1

## 2020-01-16 ENCOUNTER — Other Ambulatory Visit: Payer: Self-pay | Admitting: Psychiatry

## 2020-01-16 DIAGNOSIS — F17200 Nicotine dependence, unspecified, uncomplicated: Secondary | ICD-10-CM

## 2020-01-22 ENCOUNTER — Other Ambulatory Visit: Payer: Self-pay | Admitting: Psychiatry

## 2020-01-22 DIAGNOSIS — F410 Panic disorder [episodic paroxysmal anxiety] without agoraphobia: Secondary | ICD-10-CM

## 2020-01-24 ENCOUNTER — Ambulatory Visit (INDEPENDENT_AMBULATORY_CARE_PROVIDER_SITE_OTHER): Payer: Medicare HMO | Admitting: Psychiatry

## 2020-01-24 ENCOUNTER — Encounter: Payer: Self-pay | Admitting: Psychiatry

## 2020-01-24 ENCOUNTER — Other Ambulatory Visit: Payer: Self-pay

## 2020-01-24 DIAGNOSIS — F4000 Agoraphobia, unspecified: Secondary | ICD-10-CM | POA: Diagnosis not present

## 2020-01-24 DIAGNOSIS — F99 Mental disorder, not otherwise specified: Secondary | ICD-10-CM

## 2020-01-24 DIAGNOSIS — F17200 Nicotine dependence, unspecified, uncomplicated: Secondary | ICD-10-CM

## 2020-01-24 DIAGNOSIS — F5105 Insomnia due to other mental disorder: Secondary | ICD-10-CM | POA: Diagnosis not present

## 2020-01-24 DIAGNOSIS — F1590 Other stimulant use, unspecified, uncomplicated: Secondary | ICD-10-CM

## 2020-01-24 DIAGNOSIS — F339 Major depressive disorder, recurrent, unspecified: Secondary | ICD-10-CM

## 2020-01-24 DIAGNOSIS — F410 Panic disorder [episodic paroxysmal anxiety] without agoraphobia: Secondary | ICD-10-CM | POA: Diagnosis not present

## 2020-01-24 NOTE — Progress Notes (Signed)
Provider Location : [facility] Patient Location : Home  Virtual Visit via Video Note  I connected with [name] on 01/24/20 at  4:00 PM EST by a video enabled telemedicine application and verified that I am speaking with the correct person using two identifiers.   I discussed the limitations of evaluation and management by telemedicine and the availability of in person appointments. The patient expressed understanding and agreed to proceed.    I discussed the assessment and treatment plan with the patient. The patient was provided an opportunity to ask questions and all were answered. The patient agreed with the plan and demonstrated an understanding of the instructions.   The patient was advised to call back or seek an in-person evaluation if the symptoms worsen or if the condition fails to improve as anticipated.   [place] MD OP Progress Note  01/24/2020 4:35 PM Len[place]  MRN:  [id number]  Chief Complaint:  Chief Complaint    Follow-up     HPI: Mary Koch is a 49 year old Caucasian female, who lives in [place], has a history of MDD, bereavement, panic disorder, group phobia, tobacco use disorder, insomnia, caffeine use disorder was evaluated by telemedicine today.  Patient reports she was recently diagnosed with COVID-19 infection and is currently recovering from the same.  She reports she may have got exposed to a friend who have had it.  She reports her son also came back positive for the same.  She currently struggles with some shortness of breath and cough occasionally.  She reports she will get in touch with her primary care provider as needed and agrees to go to the nearest emergency department if her symptoms worsen.  She reports her mood symptoms have improved on the current medication regimen.  She reports she feels more motivated and denies any crying spells.  She is tolerating the bupropion well.  She reports sleep is good.  Patient denies any suicidality, homicidality or  perceptual disturbances.  She is cutting back on smoking cigarettes.  She denies any other concerns today.   Visit Diagnosis:    ICD-10-CM   1. MDD (major depressive disorder), recurrent episode, with atypical features ([place])  F33.9   2. Panic disorder  F41.0   3. Agoraphobia  F40.00   4. Insomnia due to other mental disorder  F51.05    F99   5. Tobacco use disorder  F17.200   6. Caffeine use disorder  F15.90     Past Psychiatric History: Reviewed past psychiatric history from my progress note on 03/24/2018.  Past trials of Paxil, Luvox, Klonopin, Zoloft, Seroquel, Wellbutrin.  Past Medical History:  Past Medical History:  Diagnosis Date  . Allergic rhinitis   . Anemia   . Anxiety   . Anxiety and depression   . Carpal tunnel syndrome   . COVID-19 01/15/2020  . Depression   . Fibroids   . GERD (gastroesophageal reflux disease)   . Headache(784.0)   . Headache(784.0)   . Heavy periods   . Hyperthyroidism    right portion of thyroid removed  . Muscle spasm   . Neurological disorder    evaluation for ms  . Painful menstrual periods   . Shortness of breath    when i smoke a lot    Past Surgical History:  Procedure Laterality Date  . BREAST BIOPSY Left    neg  . carpel tunnel Left 2017  . CESAREAN SECTION     times 2  . CHOLECYSTECTOMY    .  THYROID LOBECTOMY      Family Psychiatric History: Reviewed family psychiatric history from my progress note on 03/24/2018.  Family History:  Family History  Problem Relation Age of Onset  . Osteoporosis Mother   . Diabetes Father   . Anxiety disorder Father   . Depression Father   . Post-traumatic stress disorder Father   . Heart disease Father   . Anxiety disorder Sister   . Alcohol abuse Brother   . Breast cancer Neg Hx   . Colon cancer Neg Hx   . Ovarian cancer Neg Hx     Social History: Reviewed social history from my progress note on 03/24/2018. Social History   Socioeconomic History  . Marital status:  Legally Separated    Spouse name: Not on file  . Number of children: Not on file  . Years of education: Not on file  . Highest education level: Not on file  Occupational History  . Not on file  Tobacco Use  . Smoking status: Current Every Day Smoker    Packs/day: 0.25    Years: 25.00    Pack years: 6.25    Types: Cigarettes    Start date: 05/22/1986  . Smokeless tobacco: Never Used  . Tobacco comment: Has cut back to 4 a day with Chantix  Substance and Sexual Activity  . Alcohol use: No    Alcohol/week: 0.0 standard drinks  . Drug use: No  . Sexual activity: Yes    Partners: Male    Birth control/protection: Surgical, Injection  Other Topics Concern  . Not on file  Social History Narrative  . Not on file   Social Determinants of Health   Financial Resource Strain:   . Difficulty of Paying Living Expenses: Not on file  Food Insecurity:   . Worried About Charity fundraiser in the Last Year: Not on file  . Ran Out of Food in the Last Year: Not on file  Transportation Needs:   . Lack of Transportation (Medical): Not on file  . Lack of Transportation (Non-Medical): Not on file  Physical Activity:   . Days of Exercise per Week: Not on file  . Minutes of Exercise per Session: Not on file  Stress:   . Feeling of Stress : Not on file  Social Connections:   . Frequency of Communication with Friends and Family: Not on file  . Frequency of Social Gatherings with Friends and Family: Not on file  . Attends Religious Services: Not on file  . Active Member of Clubs or Organizations: Not on file  . Attends Archivist Meetings: Not on file  . Marital Status: Not on file    Allergies:  Allergies  Allergen Reactions  . Azithromycin Other (See Comments)    headache  . Codeine Other (See Comments)    unknown  . Penicillins Other (See Comments)    headaches    Metabolic Disorder Labs: No results found for: HGBA1C, MPG No results found for: PROLACTIN No results  found for: CHOL, TRIG, HDL, CHOLHDL, VLDL, LDLCALC No results found for: TSH  Therapeutic Level Labs: No results found for: LITHIUM No results found for: VALPROATE No components found for:  CBMZ  Current Medications: Current Outpatient Medications  Medication Sig Dispense Refill  . AFLURIA QUADRIVALENT 0.5 ML injection     . albuterol (PROVENTIL HFA;VENTOLIN HFA) 108 (90 Base) MCG/ACT inhaler INHALE 1 TO 2 PUFFS DAILY    . BOOSTRIX 5-2.5-18.5 LF-MCG/0.5 injection     .  BREO ELLIPTA 100-25 MCG/INH AEPB     . buPROPion (WELLBUTRIN SR) 100 MG 12 hr tablet Take 1 tablet (100 mg total) by mouth 2 (two) times daily. 180 tablet 0  . CHANTIX CONTINUING MONTH PAK 1 MG tablet TAKE 1 TABLET BY MOUTH TWICE A DAY 168 tablet 0  . cyclobenzaprine (FLEXERIL) 10 MG tablet Take 10 mg by mouth 3 (three) times daily as needed for muscle spasms.    . DULoxetine (CYMBALTA) 60 MG capsule TAKE 1 CAPSULE BY MOUTH TWICE A DAY 180 capsule 0  . FLUBLOK QUADRIVALENT 0.5 ML injection     . fluticasone (FLONASE) 50 MCG/ACT nasal spray Place 1 spray into both nostrils daily.  4  . furosemide (LASIX) 20 MG tablet TAKE 1 TABLET BY MOUTH TWICE A WEEK  5  . gabapentin (NEURONTIN) 600 MG tablet Take 1 tablet (600 mg total) by mouth 2 (two) times daily. 180 tablet 1  . medroxyPROGESTERone Acetate 150 MG/ML SUSY Inject 1 mL (150 mg total) into the muscle every 3 (three) months. 1 mL 3  . meloxicam (MOBIC) 7.5 MG tablet Take 7.5 mg by mouth daily.  0  . omeprazole (PRILOSEC) 40 MG capsule Take 40 mg by mouth daily.    . propranolol (INDERAL) 10 MG tablet Take 1 tablet (10 mg total) by mouth 3 (three) times daily as needed. 270 tablet 1  . QUEtiapine (SEROQUEL) 50 MG tablet TAKE 1 TABLET BY MOUTH EVERYDAY AT BEDTIME- patient has supplies 30 tablet 1  . rosuvastatin (CRESTOR) 20 MG tablet Take 20 mg by mouth daily.    Marland Kitchen topiramate (TOPAMAX) 100 MG tablet TAKE 1.5 TABLETS (150 MG TOTAL) BY MOUTH DAILY. 135 tablet 1   No current  facility-administered medications for this visit.     Musculoskeletal: Strength & Muscle Tone: UTA Gait & Station: normal Patient leans: N/A  Psychiatric Specialty Exam: Review of Systems  Respiratory: Positive for cough and shortness of breath.   Psychiatric/Behavioral: Negative for agitation, behavioral problems, confusion, decreased concentration, dysphoric mood, hallucinations, self-injury, sleep disturbance and suicidal ideas. The patient is not nervous/anxious and is not hyperactive.   All other systems reviewed and are negative.   There were no vitals taken for this visit.There is no height or weight on file to calculate BMI.  General Appearance: Casual  Eye Contact:  Fair  Speech:  Clear and Coherent  Volume:  Normal  Mood:  Euthymic  Affect:  Congruent  Thought Process:  Goal Directed and Descriptions of Associations: Intact  Orientation:  Full (Time, Place, and Person)  Thought Content: Logical   Suicidal Thoughts:  No  Homicidal Thoughts:  No  Memory:  Immediate;   Fair Recent;   Fair Remote;   Fair  Judgement:  Fair  Insight:  Fair  Psychomotor Activity:  Normal  Concentration:  Concentration: Fair and Attention Span: Fair  Recall:  [employer] of Knowledge: Fair  Language: Fair  Akathisia:  No  Handed:  Right  AIMS (if indicated): denies tremors, rigidity  Assets:  Communication Skills Desire for Improvement Housing Social Support  ADL's:  Intact  Cognition: WNL  Sleep:  Fair   Screenings: AUDIT     Office Visit from 05/22/2016 in [facility]  Alcohol Use Disorder Identification Test Final Score (AUDIT)  0    GAD-7     Office Visit from 08/10/2019 in [facility]  Total GAD-7 Score  14    PHQ2-9     Office Visit  from 08/10/2019 in [facility] Visit from 05/22/2016 in [facility]  PHQ-2 Total Score  3  4  PHQ-9 Total Score  15  18       Assessment and Plan: Mary Koch  is a 49 year old Caucasian female with a history of panic attacks, MDD, tobacco use disorder, caffeine use disorder, insomnia was evaluated by telemedicine today.  Patient is currently making progress on the current medication regimen.  She does have psychosocial stressors of recent COVID-19 infection, currently recovering  from the same.  Plan as noted below.  Plan MDD-improving Wellbutrin SR 100 mg p.o. twice daily Cymbalta 60 mg p.o. twice daily Neurontin 600 mg p.o. twice daily Seroquel 50 mg p.o. nightly-reduced dosage. Long-term plan is to take her off of Seroquel, she is on multiple antidepressants and mood stabilizers at this time.  This was discussed with patient.  She reports she wants to stay on her current medication regimen at this time.  Panic attacks-stable Cymbalta 60 mg p.o. twice daily Propranolol 10 mg p.o. 3 times daily as needed Gabapentin 600 mg p.o. 3 times daily.  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  Provided supportive psychotherapy, provided education about COVID-19 infection, patient advised to go to the nearest emergency department if her symptoms worsen.  Follow-up in clinic in 4 weeks or sooner if needed.  February 26 at 10:40 AM  I have spent atleast 20 minutes non face to face with patient today. More than 50 % of the time was spent for  ordering medications and test ,psychoeducation and supportive psychotherapy and care coordination,as well as documenting clinical information in electronic health record. This note was generated in part or whole with voice recognition software. Voice recognition is usually quite accurate but there are transcription errors that can and very often do occur. I apologize  for any typographical errors that were not detected and corrected.       Ursula Alert, MD 01/24/2020, 4:35 PM

## 2020-02-25 ENCOUNTER — Encounter: Payer: Self-pay | Admitting: Licensed Clinical Social Worker

## 2020-02-25 ENCOUNTER — Ambulatory Visit (INDEPENDENT_AMBULATORY_CARE_PROVIDER_SITE_OTHER): Payer: Medicare HMO | Admitting: Certified Nurse Midwife

## 2020-02-25 ENCOUNTER — Other Ambulatory Visit: Payer: Self-pay

## 2020-02-25 ENCOUNTER — Ambulatory Visit (INDEPENDENT_AMBULATORY_CARE_PROVIDER_SITE_OTHER): Payer: Medicare HMO | Admitting: Psychiatry

## 2020-02-25 ENCOUNTER — Encounter: Payer: Self-pay | Admitting: Psychiatry

## 2020-02-25 ENCOUNTER — Ambulatory Visit: Payer: Medicare HMO | Admitting: Licensed Clinical Social Worker

## 2020-02-25 ENCOUNTER — Ambulatory Visit (INDEPENDENT_AMBULATORY_CARE_PROVIDER_SITE_OTHER): Payer: Medicare HMO | Admitting: Licensed Clinical Social Worker

## 2020-02-25 VITALS — BP 116/76 | HR 98 | Ht 66.0 in | Wt 267.8 lb

## 2020-02-25 DIAGNOSIS — F339 Major depressive disorder, recurrent, unspecified: Secondary | ICD-10-CM

## 2020-02-25 DIAGNOSIS — Z3042 Encounter for surveillance of injectable contraceptive: Secondary | ICD-10-CM

## 2020-02-25 DIAGNOSIS — F99 Mental disorder, not otherwise specified: Secondary | ICD-10-CM | POA: Diagnosis not present

## 2020-02-25 DIAGNOSIS — F1590 Other stimulant use, unspecified, uncomplicated: Secondary | ICD-10-CM

## 2020-02-25 DIAGNOSIS — F410 Panic disorder [episodic paroxysmal anxiety] without agoraphobia: Secondary | ICD-10-CM

## 2020-02-25 DIAGNOSIS — F4000 Agoraphobia, unspecified: Secondary | ICD-10-CM | POA: Diagnosis not present

## 2020-02-25 DIAGNOSIS — F17200 Nicotine dependence, unspecified, uncomplicated: Secondary | ICD-10-CM

## 2020-02-25 DIAGNOSIS — F5105 Insomnia due to other mental disorder: Secondary | ICD-10-CM | POA: Diagnosis not present

## 2020-02-25 MED ORDER — BUPROPION HCL ER (SR) 100 MG PO TB12: 100.0000 mg | ORAL_TABLET | Freq: Two times a day (BID) | ORAL | 0 refills | Status: DC

## 2020-02-25 MED ORDER — QUETIAPINE FUMARATE 50 MG PO TABS: 50.0000 mg | ORAL_TABLET | Freq: Every day | ORAL | 0 refills | Status: DC

## 2020-02-25 MED ORDER — MEDROXYPROGESTERONE ACETATE 150 MG/ML IM SUSP
150.0000 mg | Freq: Once | INTRAMUSCULAR | Status: AC
  Administered 2020-02-25: 150 mg via INTRAMUSCULAR

## 2020-02-25 MED ORDER — TOPIRAMATE 100 MG PO TABS: 150.0000 mg | ORAL_TABLET | Freq: Every day | ORAL | 1 refills | Status: DC

## 2020-02-25 NOTE — Progress Notes (Signed)
Comprehensive Clinical Assessment (CCA) Note  02/25/2020 Mary Koch [id number]  Visit Diagnosis:      ICD-10-CM   1. Panic disorder  F41.0       CCA Part One  Part One has been completed on paper by the patient.  (See scanned document in Chart Review)  CCA Part Two A  Intake/Chief Complaint:  CCA Intake With Chief Complaint CCA Part Two Date: 02/25/20 CCA Part Two Time: 0900 Chief Complaint/Presenting Problem: Pt presents as a 49 year old, Caucasian, female for assessment. Pt was referred by Dr. Lavera Guise and is seeking counseling for panic disorder w/ agoraphobia. Pt reported depression and panic attacks are a constant source of stress impacting her ability to leave the home and be around others. Pt is on disability and stays in the home to take care of children and household chores. Pt reports no other outlets and is in need of more effective coping skills. Patients Currently Reported Symptoms/Problems: Anxiety, Panic Attacks, Agoraphobia, Limited Social Interactions, Limited Coping Skills/Outlets Collateral Involvement: N/A Individual's Strengths: Pt reported her strengths include being a mother and finds this as the only thing motivating her to push through each day. Individual's Preferences: Pt reported she prefers a therapist who is conversational. Individual's Abilities: Pt enjoys taking care of her children and had interest in working with animals. Type of Services Patient Feels Are Needed: Individual Therapy Initial Clinical Notes/Concerns: N/A  Mental Health Symptoms Depression:  Depression: Increase/decrease in appetite, Sleep (too much or little), Weight gain/loss  Mania:  Mania: N/A  Anxiety:   Anxiety: Worrying, Tension(social anxiety)  Psychosis:  Psychosis: Hallucinations(hx of seeing shadows and sounded like people were whispering - 25 years ago)  Trauma:  Trauma: N/A  Obsessions:  Obsessions: N/A  Compulsions:  Compulsions: N/A  Inattention:  Inattention:  Forgetful  Hyperactivity/Impulsivity:  Hyperactivity/Impulsivity: N/A  Oppositional/Defiant Behaviors:  Oppositional/Defiant Behaviors: N/A  Borderline Personality:  Emotional Irregularity: N/A  Other Mood/Personality Symptoms:  Other Mood/Personality Symtpoms: N/A   Mental Status Exam Appearance and self-care  Stature:  Stature: Average  Weight:  Weight: Overweight  Clothing:  Clothing: Casual  Grooming:  Grooming: Normal  Cosmetic use:  Cosmetic Use: Age appropriate  Posture/gait:  Posture/Gait: Normal  Motor activity:  Motor Activity: Not Remarkable  Sensorium  Attention:  Attention: Normal  Concentration:  Concentration: Normal  Orientation:  Orientation: X5  Recall/memory:  Recall/Memory: Normal  Affect and Mood  Affect:  Affect: Other (Comment)(Pt appeared calm)  Mood:  Mood: Anxious  Relating  Eye contact:  Eye Contact: Normal  Facial expression:  Facial Expression: Constricted  Attitude toward examiner:  Attitude Toward Examiner: Cooperative  Thought and Language  Speech flow: Speech Flow: Normal  Thought content:  Thought Content: Appropriate to mood and circumstances  Preoccupation:  Preoccupations: Other (Comment)(N/A)  Hallucinations:  Hallucinations: (N/A)  Organization:   Logical  Transport planner of Knowledge:  Fund of Knowledge: Average  Intelligence:  Intelligence: Average  Abstraction:  Abstraction: Normal  Judgement:  Judgement: Fair  Art therapist:  Reality Testing: Adequate  Insight:  Insight: Flashes of insight, Gaps  Decision Making:  Decision Making: Normal, Vacilates(Pt reported intense fears of being around others which impacts her ability to leave the home.)  Social Functioning  Social Maturity:  Social Maturity: Isolates  Social Judgement:  Social Judgement: Normal  Stress  Stressors:  Stressors: Grief/losses(social anxiety)  Coping Ability:  Coping Ability: Deficient supports  Skill Deficits:   Does not engage in prosocial  activities or hobbies  Supports:   Needs additional supports   Family and Psychosocial History: Family history Marital status: Separated Separated, when?: 2012 What types of issues is patient dealing with in the relationship?: Pt is no longer in a relationship at this time. Are you sexually active?: No What is your sexual orientation?: Straight Does patient have children?: Yes How many children?: 2 How is patient's relationship with their children?: Pt reported that due to children's age they like to be independent most of the time, however relationships are positive (children are ages 81 and 106).  Childhood History:  Childhood History By whom was/is the patient raised?: Both parents Description of patient's relationship with caregiver when they were a child: Positive Patient's description of current relationship with people who raised him/her: Father is deceased and patient sees her mom once in a while. Does patient have siblings?: Yes Number of Siblings: 3 Description of patient's current relationship with siblings: Still in contact with siblings Did patient suffer any verbal/emotional/physical/sexual abuse as a child?: No Did patient suffer from severe childhood neglect?: No Has patient ever been sexually abused/assaulted/raped as an adolescent or adult?: No Was the patient ever a victim of a crime or a disaster?: No Witnessed domestic violence?: No Has patient been effected by domestic violence as an adult?: No  CCA Part Two B  Employment/Work Situation: Employment / Work Situation Employment situation: On disability Why is patient on disability: Pt reported "depression and carpel tunnel". 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 Did [place]?: No Did You Have An Individualized Education Program (IIEP): No Did You Have Any Difficulty At School?: Yes Were Any Medications Ever Prescribed For These Difficulties?: Yes Medications Prescribed For School Difficulties?: Pt does not remember  Religion: Religion/Spirituality Are You A Religious Person?: No  Leisure/Recreation: Leisure / Recreation Leisure and Hobbies: Pt reported "I got no time" for engaging in hobbies or leisure activities.  Exercise/Diet: Exercise/Diet Do You Exercise?: Yes What Type of Exercise Do You Do?: Run/Walk(Use treadmill) How Many Times a Week Do You Exercise?: 1-3 times a week Have You Gained or Lost A Significant Amount of Weight in the Past Six Months?: Yes-Gained Number of Pounds Gained: 60 Do You Follow a Special Diet?: No Do You Have Any Trouble Sleeping?: No  CCA Part Two C  Alcohol/Drug Use: Alcohol / Drug Use Pain Medications: N/A Prescriptions: N/A Over the Counter: N/A History of alcohol / drug use?: No history of alcohol / drug abuse Longest period of sobriety (when/how long): N/A Negative Consequences of Use: (N/A) Withdrawal Symptoms: (N/A)                      CCA Part Three  Substance use Disorder (SUD) Substance Use Disorder (SUD)  Checklist Symptoms of Substance Use: (N/A)  Social Function:  Social Functioning Social Maturity: Isolates Social Judgement: Normal  Stress:  Stress Stressors: Grief/losses(social anxiety) Coping Ability: Deficient supports Patient Takes Medications The Way The Doctor Instructed?: Yes Priority Risk:  Low Acuity  Risk Assessment- Self-Harm Potential: Risk  Assessment For Self-Harm Potential Thoughts of Self-Harm: No current thoughts Method: No plan Availability of Means: No access/NA Additional Information for Self-Harm Potential: Previous Attempts Additional Comments for Self-Harm Potential: N/A  Risk Assessment -Dangerous to Others Potential: Risk Assessment For Dangerous to Others Potential Method: No Plan Availability of Means: No access or NA Intent: Vague intent or NA Notification Required: No need or identified person Additional Information for Danger to Others Potential: (N/A) Additional Comments for Danger to Others Potential: N/A  DSM5 Diagnoses: Patient Active Problem List   Diagnosis Date Noted  . Panic disorder 07/28/2019  . Bereavement 07/28/2019  . MDD (major depressive disorder), recurrent episode, with atypical features ([place]) 07/28/2019  . Agoraphobia 07/28/2019  . Insomnia due to other mental disorder 07/28/2019  . 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 Tobacco use disorder 12/16/2017  . Anxiety and depression 06/25/2015  . Carpal tunnel syndrome 06/25/2015  . Family planning 06/25/2015  . Bloodgood disease 06/25/2015  . Fibroid 06/25/2015  . Acid reflux 06/25/2015  . Excess, menstruation 06/25/2015  . Increased BMI 06/25/2015  . Adiposity 06/25/2015  . Dysmenorrhea 06/25/2015  . Disease of thyroid gland 06/25/2015  . Compulsive tobacco user syndrome 06/25/2015  . Episodic tension type headache 08/23/2014  . Allergic rhinitis 07/23/2013  . Wheezing 07/23/2013  . Lower urinary tract infectious disease 03/11/2013  . Hematuria 03/11/2013  . D (diarrhea) 01/13/2013  . Vomiting 01/13/2013  . Amenorrhea 12/22/2012  . HLD (hyperlipidemia) 08/31/2011    Patient Centered Plan: Patient is on the following Treatment Plan(s):  Anxiety  Recommendations for Services/Supports/Treatments: Recommendations for Services/Supports/Treatments Recommendations For Services/Supports/Treatments: Individual Therapy       Regla Fitzgibbon Wynelle Link, LCSW, LCAS

## 2020-02-25 NOTE — Progress Notes (Signed)
Date last pap: NA Last Depo-Provera: 11/29/2019 Side Effects if any: NA  Serum HCG indicated? NA Depo-Provera 150 mg IM given by: [facility] CMA Next appointment due May 14-May 28  Today's Vitals   02/25/20 1342  BP: 116/76  Pulse: 98  Weight: 267 lb 12.8 oz (121.5 kg)  Height: 5\' 6"  (1.676 m)   Body mass index is 43.22 kg/m.

## 2020-02-25 NOTE — Progress Notes (Signed)
Provider Location : [facility] Patient Location : Home   Virtual Visit via Telephone Note  I connected with Mary Koch on 02/25/20 at 10:40 AM EST by telephone and verified that I am speaking with the correct person using two identifiers.   I discussed the limitations, risks, security and privacy concerns of performing an evaluation and management service by telephone and the availability of in person appointments. I also discussed with the patient that there may be a patient responsible charge related to this service. The patient expressed understanding and agreed to proceed.      I discussed the assessment and treatment plan with the patient. The patient was provided an opportunity to ask questions and all were answered. The patient agreed with the plan and demonstrated an understanding of the instructions.   The patient was advised to call back or seek an in-person evaluation if the symptoms worsen or if the condition fails to improve as anticipated.   [place] MD OP Progress Note  02/25/2020 12:27 PM [place]  MRN:  [id number]  Chief Complaint:  Chief Complaint    Follow-up     HPI: [name] is a 49 year old Caucasian female, lives in [place], has a history of MDD, bereavement, panic disorder, agoraphobia, tobacco use disorder, insomnia, caffeine use disorder was evaluated by phone today.  Patient today reports she is currently grieving the loss of her dog.  She reports she lost her dog a week ago.  She is currently depressed.  She also has been struggling with sleep since the past 1 week.  Patient reports she started psychotherapy sessions with Ms. Zadie Rhine.  She reports therapy sessions is going well.  Patient denies any suicidality, homicidality or perceptual disturbances.  Patient denies any other concerns today. Visit Diagnosis:    ICD-10-CM   1. MDD (major depressive disorder), recurrent episode, with atypical features (HCC)  F33.9 buPROPion (WELLBUTRIN SR) 100 MG 12  hr tablet    QUEtiapine (SEROQUEL) 50 MG tablet    topiramate (TOPAMAX) 100 MG tablet  2. Panic disorder  F41.0 QUEtiapine (SEROQUEL) 50 MG tablet  3. Agoraphobia  F40.00   4. Insomnia due to other mental disorder  F51.05    F99   5. Tobacco use disorder  F17.200   6. 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  Past trials of Paxil, Luvox, Klonopin, Zoloft, Seroquel, Wellbutrin.  Past Medical History:  Past Medical History:  Diagnosis Date  . Allergic rhinitis   . Anemia   . Anxiety   . Anxiety and depression   . Carpal tunnel syndrome   . COVID-19 01/15/2020  . Depression   . Fibroids   . GERD (gastroesophageal reflux disease)   . Headache(784.0)   . Headache(784.0)   . Heavy periods   . Hyperthyroidism    right portion of thyroid removed  . Muscle spasm   . Neurological disorder    evaluation for ms  . Painful menstrual periods   . Shortness of breath    when i smoke a lot    Past Surgical History:  Procedure Laterality Date  . BREAST BIOPSY Left    neg  . carpel tunnel Left 2017  . CESAREAN SECTION     times 2  . CHOLECYSTECTOMY    . THYROID LOBECTOMY      Family Psychiatric History: Reviewed family psychiatric history from my progress note on 03/24/2018.  Family History:  Family History  Problem Relation Age of Onset  . Osteoporosis Mother   . Diabetes Father   . Anxiety disorder Father   . Depression Father   . Post-traumatic stress disorder Father   . Heart disease Father   . Anxiety disorder Sister   . Alcohol abuse Brother   . Breast cancer Neg Hx   . Colon cancer Neg Hx   . Ovarian cancer Neg Hx     Social History: Reviewed social history from my progress note on 03/24/2018. Social History   Socioeconomic History  . Marital status: Legally Separated    Spouse name: Not on file  . Number of children: Not on file  . Years of education: Not on file   . Highest education level: Not on file  Occupational History  . Not on file  Tobacco Use  . Smoking status: Current Every Day Smoker    Packs/day: 0.25    Years: 25.00    Pack years: 6.25    Types: Cigarettes    Start date: 05/22/1986  . Smokeless tobacco: Never Used  . Tobacco comment: Has cut back to 4 a day with Chantix  Substance and Sexual Activity  . Alcohol use: No    Alcohol/week: 0.0 standard drinks  . Drug use: No  . Sexual activity: Yes    Partners: Male    Birth control/protection: Surgical, Injection  Other Topics Concern  . Not on file  Social History Narrative  . Not on file   Social Determinants of Health   Financial Resource Strain:   . Difficulty of Paying Living Expenses: Not on file  Food Insecurity:   . Worried About Charity fundraiser in the Last Year: Not on file  . Ran Out of Food in the Last Year: Not on file  Transportation Needs:   . Lack of Transportation (Medical): Not on file  . Lack of Transportation (Non-Medical): Not on file  Physical Activity:   . Days of Exercise per Week: Not on file  . Minutes of Exercise per Session: Not on file  Stress:   . Feeling of Stress : Not on file  Social Connections:   . Frequency of Communication with Friends and Family: Not on file  . Frequency of Social Gatherings with Friends and Family: Not on file  . Attends Religious Services: Not on file  . Active Member of Clubs or Organizations: Not on file  . Attends Archivist Meetings: Not on file  . Marital Status: Not on file    Allergies:  Allergies  Allergen Reactions  . Azithromycin Other (See Comments)    headache  . Codeine Other (See Comments)    unknown  . Penicillins Other (See Comments)    headaches    Metabolic Disorder Labs: No results found for: HGBA1C, MPG No results found for: PROLACTIN No results found for: CHOL, TRIG, HDL, CHOLHDL, VLDL, LDLCALC No results found for: TSH  Therapeutic Level Labs: No results found  for: LITHIUM No results found for: VALPROATE No components found for:  CBMZ  Current Medications: Current Outpatient Medications  Medication Sig Dispense Refill  . AFLURIA QUADRIVALENT 0.5 ML injection     . albuterol (PROVENTIL HFA;VENTOLIN HFA) 108 (90 Base) MCG/ACT inhaler INHALE 1 TO 2 PUFFS DAILY    . BOOSTRIX 5-2.5-18.5 LF-MCG/0.5 injection     . BREO ELLIPTA 100-25 MCG/INH AEPB     . buPROPion (WELLBUTRIN SR) 100 MG 12 hr tablet Take 1 tablet (  100 mg total) by mouth 2 (two) times daily. 180 tablet 0  . CHANTIX CONTINUING MONTH PAK 1 MG tablet TAKE 1 TABLET BY MOUTH TWICE A DAY 168 tablet 0  . cyclobenzaprine (FLEXERIL) 10 MG tablet Take 10 mg by mouth 3 (three) times daily as needed for muscle spasms.    . DULoxetine (CYMBALTA) 60 MG capsule TAKE 1 CAPSULE BY MOUTH TWICE A DAY 180 capsule 0  . FLUBLOK QUADRIVALENT 0.5 ML injection     . fluticasone (FLONASE) 50 MCG/ACT nasal spray Place 1 spray into both nostrils daily.  4  . furosemide (LASIX) 20 MG tablet TAKE 1 TABLET BY MOUTH TWICE A WEEK  5  . gabapentin (NEURONTIN) 600 MG tablet Take 1 tablet (600 mg total) by mouth 2 (two) times daily. 180 tablet 1  . medroxyPROGESTERone Acetate 150 MG/ML SUSY Inject 1 mL (150 mg total) into the muscle every 3 (three) months. 1 mL 3  . meloxicam (MOBIC) 7.5 MG tablet Take 7.5 mg by mouth daily.  0  . omeprazole (PRILOSEC) 40 MG capsule Take 40 mg by mouth daily.    . propranolol (INDERAL) 10 MG tablet Take 1 tablet (10 mg total) by mouth 3 (three) times daily as needed. 270 tablet 1  . QUEtiapine (SEROQUEL) 50 MG tablet Take 1-1.5 tablets (50-75 mg total) by mouth at bedtime. 135 tablet 0  . rosuvastatin (CRESTOR) 20 MG tablet Take 20 mg by mouth daily.    Marland Kitchen topiramate (TOPAMAX) 100 MG tablet Take 1.5 tablets (150 mg total) by mouth daily. 150 MG AM AND 200 MG PM ,BEING PRESCRIBED PER NEUROLOGY 135 tablet 1   No current facility-administered medications for this visit.      Musculoskeletal: Strength & Muscle Tone: UTA Gait & Station: UTA Patient leans: N/A  Psychiatric Specialty Exam: Review of Systems  Psychiatric/Behavioral: Positive for dysphoric mood and sleep disturbance. The patient is nervous/anxious.   All other systems reviewed and are negative.   There were no vitals taken for this visit.There is no height or weight on file to calculate BMI.  General Appearance: UTA  Eye Contact:  UTA  Speech:  Clear and Coherent  Volume:  Normal  Mood:  Dysphoric  Affect:  UTA  Thought Process:  Goal Directed and Descriptions of Associations: Intact  Orientation:  Full (Time, Place, and Person)  Thought Content: Logical   Suicidal Thoughts:  No  Homicidal Thoughts:  No  Memory:  Immediate;   Fair Recent;   Fair Remote;   Fair  Judgement:  Fair  Insight:  Fair  Psychomotor Activity:  UTA  Concentration:  Concentration: Fair and Attention Span: Fair  Recall:  [employer] of Knowledge: Fair  Language: Fair  Akathisia:  No  Handed:  Right  AIMS (if indicated): UTA  Assets:  Communication Skills Desire for Improvement Housing Social Support  ADL's:  Intact  Cognition: WNL  Sleep:  Poor   Screenings: AUDIT     Office Visit from 05/22/2016 in [facility]  Alcohol Use Disorder Identification Test Final Score (AUDIT)  0    GAD-7     [facility] Visit from 08/10/2019 in [facility]  Total GAD-7 Score  14    PHQ2-9     Office Visit from 08/10/2019 in [facility] Visit from 05/22/2016 in [facility]  PHQ-2 Total Score  3  4  PHQ-9 Total Score  15  18       Assessment  and Plan: Keylani is a 49 year old Caucasian female with history of panic attacks, MDD, tobacco use disorder, caffeine use disorder, insomnia was evaluated by phone today.  Patient is currently grieving the loss of her dog.  Patient hence is struggling with mood and sleep.  Plan as noted  below.  Plan MDD-unstable Wellbutrin SR 100 mg p.o. twice daily Cymbalta 60 mg p.o. twice daily Neurontin 600 mg p.o. twice daily Increase Seroquel to 50 to 75 mg p.o. nightly.  Panic attacks-stable Cymbalta as prescribed Propanolol 10 mg p.o. 3 times daily as needed Gabapentin 600 mg p.o. 3 times daily  Insomnia-restless Seroquel increased to 50 to 75 mg p.o. nightly. Continue sleep hygiene techniques  Agoraphobia-stable Continue CBT with Ms. Zadie Rhine.  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  Follow-up in clinic in 6 weeks or sooner if needed.  Patient advised to continue psychotherapy sessions .  I have spent atleast 20 minutes non face to face with patient today. More than 50 % of the time was spent for ordering medications and test ,psychoeducation and supportive psychotherapy and care coordination,as well as documenting clinical information in electronic health record. This note was generated in part or whole with voice recognition software. Voice recognition is usually quite accurate but there are transcription errors that can and very often do occur. I apologize for any typographical errors that were not detected and corrected.       Ursula Alert, MD 02/25/2020, 12:27 PM

## 2020-03-03 ENCOUNTER — Ambulatory Visit (INDEPENDENT_AMBULATORY_CARE_PROVIDER_SITE_OTHER): Payer: Medicare HMO | Admitting: Licensed Clinical Social Worker

## 2020-03-03 ENCOUNTER — Other Ambulatory Visit: Payer: Self-pay

## 2020-03-03 ENCOUNTER — Encounter: Payer: Self-pay | Admitting: Licensed Clinical Social Worker

## 2020-03-03 DIAGNOSIS — F410 Panic disorder [episodic paroxysmal anxiety] without agoraphobia: Secondary | ICD-10-CM | POA: Diagnosis not present

## 2020-03-03 DIAGNOSIS — F4000 Agoraphobia, unspecified: Secondary | ICD-10-CM

## 2020-03-03 NOTE — Progress Notes (Signed)
Virtual Visit via Telephone Note  I connected with Mary Koch on 03/03/20 at  9:00 AM EST by telephone and verified that I am speaking with the correct person using two identifiers.   I discussed the limitations, risks, security and privacy concerns of performing an evaluation and management service by telephone and the availability of in person appointments. I also discussed with the patient that there may be a patient responsible charge related to this service. The patient expressed understanding and agreed to proceed.   THERAPY PROGRESS NOTE  Session Time: 20 Minutes  Participation Level: Active  Behavioral Response: AlertAnxious  Type of Therapy: Individual Therapy  Treatment Goals addressed: Anxiety and Coping  Interventions: CBT  Summary: Mary Koch is a 49 y.o. female who presents with Panic Disorder w/ Agoraphobia. Pt reported fears/phobias that bring great anxiety and ranked them from most to least feared. Pt reported her greatest fear is death and described past experiences and their impact in current functioning. Pt reported her lower level fears that impact daily functioning include driving and going to the grocery store/running errands. Pt identified ways she has tried to cope or resolve these fears including deep breathing and avoiding feared situations. Pt acknowledged pros and cons of these coping strategies.   Suicidal/Homicidal: No  Therapist Response: Therapist met with patient for first session since [facility]. Therapist reviewed goals of treatment with patient. Pt in agreement. Therapist introduced concept of CBT and exposure therapy. Therapist encouraged patient to create a hierarchy of fear and describe specific events that are tied to these fears. Pt was receptive. For homework therapist assigned patient to identify positive associations she could make with fears that could not be avoided by choosing rewards that would incentivize her to complete tasks outside the  home.  Plan: Return again in 2 weeks.  Diagnosis: Axis I: Panic Disorder    Axis II: N/A    Josephine Igo, LCSW, LCAS 03/03/2020

## 2020-03-17 ENCOUNTER — Ambulatory Visit (INDEPENDENT_AMBULATORY_CARE_PROVIDER_SITE_OTHER): Payer: Medicare HMO | Admitting: Licensed Clinical Social Worker

## 2020-03-17 ENCOUNTER — Encounter: Payer: Self-pay | Admitting: Licensed Clinical Social Worker

## 2020-03-17 ENCOUNTER — Other Ambulatory Visit: Payer: Self-pay

## 2020-03-17 DIAGNOSIS — F4000 Agoraphobia, unspecified: Secondary | ICD-10-CM

## 2020-03-17 DIAGNOSIS — F410 Panic disorder [episodic paroxysmal anxiety] without agoraphobia: Secondary | ICD-10-CM

## 2020-03-17 NOTE — Progress Notes (Signed)
Virtual Visit via Telephone Note  I connected with Mary Koch on 03/17/20 at 11:00 AM EDT by telephone and verified that I am speaking with the correct person using two identifiers.   I discussed the limitations, risks, security and privacy concerns of performing an evaluation and management service by telephone and the availability of in person appointments. I also discussed with the patient that there may be a patient responsible charge related to this service. The patient expressed understanding and agreed to proceed.  THERAPY PROGRESS NOTE  Session Time: 30 Minutes  Participation Level: Minimal  Behavioral Response: AlertAnxious  Type of Therapy: Individual Therapy  Treatment Goals addressed: Anxiety and Coping  Interventions: CBT  Summary: Mary Koch is a 49 y.o. female who presents with anxiety sxs. Pt reported that she hasn't left the house at all in the last 2 weeks and therefore wasn't able to complete the homework assignment. Pt reported that the main pleasant activities she engages in at home are watching TV and playing a family game every once in a while. Pt struggled to identify other activities she would be willing to engage in at this time and reported "I would feel guilty" for treating herself after completing an anxiety provoking, but necessary task. Pt reported she has a difficult time opening up to others and is mainly "a listener and not a big talker". Pt reported the last time she remembered being active w/out feeling immense anxiety and panic was over 20 years ago when she was in a relationship. Pt reported after her last marriage, she started leaving home less and less.  Suicidal/Homicidal: No  Therapist Response: Therapist and patient met for follow-up session. Therapist and patient discussed homework assignment from last session. Therapist reviewed pleasant activity list with patient and encouraged her to identify rewarding activities for herself and to do  with the kids to increase engagement. Therapist explored with patient core beliefs and experiences in her past that may have triggered her anxiety. Therapist encouraged patient to identify some pleasant activities using the pleasant activity list for homework that would be least anxiety provoking for her and family to do at least once per week or on the weekends. Pt was receptive.  Plan: Return again in 1 week.  Diagnosis: Axis I: Panic Disorder w/ Agoraphobia    Axis II: N/A    Josephine Igo, LCSW, LCAS 03/17/2020

## 2020-03-23 DIAGNOSIS — F419 Anxiety disorder, unspecified: Secondary | ICD-10-CM | POA: Diagnosis not present

## 2020-03-23 DIAGNOSIS — M549 Dorsalgia, unspecified: Secondary | ICD-10-CM | POA: Diagnosis not present

## 2020-03-23 DIAGNOSIS — F329 Major depressive disorder, single episode, unspecified: Secondary | ICD-10-CM | POA: Diagnosis not present

## 2020-03-24 ENCOUNTER — Other Ambulatory Visit: Payer: Self-pay

## 2020-03-24 ENCOUNTER — Encounter: Payer: Self-pay | Admitting: Licensed Clinical Social Worker

## 2020-03-24 ENCOUNTER — Ambulatory Visit (INDEPENDENT_AMBULATORY_CARE_PROVIDER_SITE_OTHER): Payer: Medicare HMO | Admitting: Licensed Clinical Social Worker

## 2020-03-24 DIAGNOSIS — F4000 Agoraphobia, unspecified: Secondary | ICD-10-CM

## 2020-03-24 DIAGNOSIS — F410 Panic disorder [episodic paroxysmal anxiety] without agoraphobia: Secondary | ICD-10-CM | POA: Diagnosis not present

## 2020-03-24 NOTE — Progress Notes (Signed)
Virtual Visit via Video Note  I connected with Mary Koch on 03/24/20 at 11:00 AM EDT by a video enabled telemedicine application and verified that I am speaking with the correct person using two identifiers.   I discussed the limitations of evaluation and management by telemedicine and the availability of in person appointments. The patient expressed understanding and agreed to proceed.  THERAPY PROGRESS NOTE  Session Time: 45 Minutes  Participation Level: Active  Behavioral Response: CasualAlertAnxious  Type of Therapy: Individual Therapy  Treatment Goals addressed: Anxiety  Interventions: CBT  Summary: Mary Koch is a 49 y.o. female who presents with panic disorder w/ agoraphobia. Pt reported that she left the house yesterday to attend a dental appointment. After that patient picked up her son and went straight home. Pt did not attempt to reward self and has not made progress in previous homework assignment. Pt identified her main worry of "people judging me" if she were to leave the house. Pt then identified how likely this fear is to come true and past experiences that support this conclusion as well as experiences that did not support it. Pt identified worst case scenario and compared that to most likely scenario. Lastly patient acknowledged that the impact of the feared event would not really bother her in one week, one month or one year from now because "I wouldn't remember it".  Suicidal/Homicidal: No  Therapist Response: Therapist met with patient for follow up session. Therapist and patient reviewed previous homework assignment. Therapist introduced concept of cognitive restructuring and provided psycho-education on the process of using decatastropizing as a tool for challenging cognitive distortions. Therapist engaged patient in the exercise. Pt was receptive. Therapist and patient then processed how she felt afterwards and went over the purpose of this exercise.  Plan:  Return again in 1 week.  Diagnosis: Axis I: Panic Disorder and Agoraphobia    Axis II: N/A   Josephine Igo, LCSW, LCAS 03/24/2020

## 2020-03-31 ENCOUNTER — Ambulatory Visit: Payer: Medicare HMO | Admitting: Licensed Clinical Social Worker

## 2020-04-06 ENCOUNTER — Other Ambulatory Visit: Payer: Self-pay

## 2020-04-06 ENCOUNTER — Encounter: Payer: Self-pay | Admitting: Psychiatry

## 2020-04-06 ENCOUNTER — Ambulatory Visit (INDEPENDENT_AMBULATORY_CARE_PROVIDER_SITE_OTHER): Payer: Medicare HMO | Admitting: Psychiatry

## 2020-04-06 DIAGNOSIS — F339 Major depressive disorder, recurrent, unspecified: Secondary | ICD-10-CM

## 2020-04-06 DIAGNOSIS — F410 Panic disorder [episodic paroxysmal anxiety] without agoraphobia: Secondary | ICD-10-CM

## 2020-04-06 DIAGNOSIS — Z79899 Other long term (current) drug therapy: Secondary | ICD-10-CM

## 2020-04-06 DIAGNOSIS — F99 Mental disorder, not otherwise specified: Secondary | ICD-10-CM

## 2020-04-06 DIAGNOSIS — F5105 Insomnia due to other mental disorder: Secondary | ICD-10-CM

## 2020-04-06 DIAGNOSIS — F17200 Nicotine dependence, unspecified, uncomplicated: Secondary | ICD-10-CM

## 2020-04-06 DIAGNOSIS — F4000 Agoraphobia, unspecified: Secondary | ICD-10-CM | POA: Diagnosis not present

## 2020-04-06 DIAGNOSIS — F1590 Other stimulant use, unspecified, uncomplicated: Secondary | ICD-10-CM | POA: Diagnosis not present

## 2020-04-06 MED ORDER — DULOXETINE HCL 60 MG PO CPEP: 60.0000 mg | ORAL_CAPSULE | Freq: Two times a day (BID) | ORAL | 0 refills | Status: DC

## 2020-04-06 MED ORDER — QUETIAPINE FUMARATE 100 MG PO TABS: 100.0000 mg | ORAL_TABLET | Freq: Every day | ORAL | 0 refills | Status: DC

## 2020-04-06 MED ORDER — PROPRANOLOL HCL 10 MG PO TABS: 10.0000 mg | ORAL_TABLET | Freq: Three times a day (TID) | ORAL | 1 refills | Status: DC | PRN

## 2020-04-06 NOTE — Patient Instructions (Signed)
Follow-up in May 4 at 9 AM

## 2020-04-06 NOTE — Progress Notes (Signed)
Provider Location : [facility] Patient Location : Home  Virtual Visit via Video Note  I connected with [name] on 04/06/20 at 11:20 AM EDT by a video enabled telemedicine application and verified that I am speaking with the correct person using two identifiers.   I discussed the limitations of evaluation and management by telemedicine and the availability of in person appointments. The patient expressed understanding and agreed to proceed.    I discussed the assessment and treatment plan with the patient. The patient was provided an opportunity to ask questions and all were answered. The patient agreed with the plan and demonstrated an understanding of the instructions.   The patient was advised to call back or seek an in-person evaluation if the symptoms worsen or if the condition fails to improve as anticipated.  [place] MD OP Progress Note  04/06/2020 12:24 PM [place]  MRN:  [id number]  Chief Complaint:  Chief Complaint    Follow-up     HPI: Mary Koch is a 49 year old Caucasian female, lives in [place], has a history of MDD, bereavement, panic disorder, agoraphobia, tobacco use disorder, insomnia, caffeine use disorder was evaluated by telemedicine today.  Patient today reports she continues to have good days and bad days.  She reports there are days when she has a lot of racing thoughts.  She reports she feels isolated by the pandemic.  She reports her children spends time playing video games and she sits alone and ruminates about her life.  This does affect her emotionally.  Patient reports sleep is affected by her racing thoughts on and off.  Patient reports she has started seeing her therapist Ms. Zadie Rhine however her therapist is not in clinic for the next few months.  She does not know who she is going to follow-up with during this time.  Patient denies any suicidality, homicidality or perceptual disturbances.  She continues to use caffeine however reports she has been  cutting back.  She also has been cutting back on smoking cigarettes on the Chantix.  Patient denies any other concerns today. Visit Diagnosis:    ICD-10-CM   1. MDD (major depressive disorder), recurrent episode, with atypical features ([place])  F33.9 TSH    QUEtiapine (SEROQUEL) 100 MG tablet  2. Panic disorder  F41.0 QUEtiapine (SEROQUEL) 100 MG tablet    propranolol (INDERAL) 10 MG tablet    DULoxetine (CYMBALTA) 60 MG capsule  3. Agoraphobia  F40.00   4. Insomnia due to other mental disorder  F51.05 QUEtiapine (SEROQUEL) 100 MG tablet   F99   5. Tobacco use disorder  F17.200   6. 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 High risk medication use  Z79.899 Lipid panel    Hemoglobin A1C    Prolactin    Past Psychiatric History: I have reviewed past psychiatric history from my progress note on 03/24/2018.  Past trials of medications, Paxil, Luvox, Klonopin, Zoloft, Seroquel, Wellbutrin.  Past Medical History:  Past Medical History:  Diagnosis Date  . Allergic rhinitis   . Anemia   . Anxiety   . Anxiety and depression   . Carpal tunnel syndrome   . COVID-19 01/15/2020  . Depression   . Fibroids   . GERD (gastroesophageal reflux disease)   . Headache(784.0)   . Headache(784.0)   . Heavy periods   . Hyperthyroidism    right portion of thyroid removed  . Muscle spasm   . Neurological disorder    evaluation for ms  . Painful  menstrual periods   . Shortness of breath    when i smoke a lot    Past Surgical History:  Procedure Laterality Date  . BREAST BIOPSY Left    neg  . carpel tunnel Left 2017  . CESAREAN SECTION     times 2  . CHOLECYSTECTOMY    . THYROID LOBECTOMY      Family Psychiatric History: I have reviewed family psychiatric history from my progress note on 03/24/2018  Family History:  Family History  Problem Relation Age of Onset  . Osteoporosis Mother   . Diabetes Father   . Anxiety disorder Father   . Depression Father   . Post-traumatic stress disorder  Father   . Heart disease Father   . Anxiety disorder Sister   . Alcohol abuse Brother   . Breast cancer Neg Hx   . Colon cancer Neg Hx   . Ovarian cancer Neg Hx     Social History: Reviewed social history from my progress note on 03/24/2018 Social History   Socioeconomic History  . Marital status: Legally Separated    Spouse name: Not on file  . Number of children: Not on file  . Years of education: Not on file  . Highest education level: Not on file  Occupational History  . Not on file  Tobacco Use  . Smoking status: Current Every Day Smoker    Packs/day: 0.25    Years: 25.00    Pack years: 6.25    Types: Cigarettes    Start date: 05/22/1986  . Smokeless tobacco: Never Used  . Tobacco comment: Has cut back to 1-2 a day with Chantix  Substance and Sexual Activity  . Alcohol use: No    Alcohol/week: 0.0 standard drinks  . Drug use: No  . Sexual activity: Yes    Partners: Male    Birth control/protection: Surgical, Injection  Other Topics Concern  . Not on file  Social History Narrative  . Not on file   Social Determinants of Health   Financial Resource Strain:   . Difficulty of Paying Living Expenses:   Food Insecurity:   . Worried About Charity fundraiser in the Last Year:   . Arboriculturist in the Last Year:   Transportation Needs:   . Film/video editor (Medical):   Marland Kitchen Lack of Transportation (Non-Medical):   Physical Activity:   . Days of Exercise per Week:   . Minutes of Exercise per Session:   Stress:   . Feeling of Stress :   Social Connections:   . Frequency of Communication with Friends and Family:   . Frequency of Social Gatherings with Friends and Family:   . Attends Religious Services:   . Active Member of Clubs or Organizations:   . Attends Archivist Meetings:   Marland Kitchen Marital Status:     Allergies:  Allergies  Allergen Reactions  . Azithromycin Other (See Comments)    headache  . Codeine Other (See Comments)    unknown  .  Penicillins Other (See Comments)    headaches    Metabolic Disorder Labs: No results found for: HGBA1C, MPG No results found for: PROLACTIN No results found for: CHOL, TRIG, HDL, CHOLHDL, VLDL, LDLCALC No results found for: TSH  Therapeutic Level Labs: No results found for: LITHIUM No results found for: VALPROATE No components found for:  CBMZ  Current Medications: Current Outpatient Medications  Medication Sig Dispense Refill  . AFLURIA QUADRIVALENT 0.5 ML injection     .  albuterol (PROVENTIL HFA;VENTOLIN HFA) 108 (90 Base) MCG/ACT inhaler INHALE 1 TO 2 PUFFS DAILY    . BOOSTRIX 5-2.5-18.5 LF-MCG/0.5 injection     . BREO ELLIPTA 100-25 MCG/INH AEPB     . buPROPion (WELLBUTRIN SR) 100 MG 12 hr tablet Take 1 tablet (100 mg total) by mouth 2 (two) times daily. 180 tablet 0  . celecoxib (CELEBREX) 100 MG capsule     . CHANTIX CONTINUING MONTH PAK 1 MG tablet TAKE 1 TABLET BY MOUTH TWICE A DAY 168 tablet 0  . cyclobenzaprine (FLEXERIL) 10 MG tablet Take 10 mg by mouth 3 (three) times daily as needed for muscle spasms.    . DULoxetine (CYMBALTA) 60 MG capsule Take 1 capsule (60 mg total) by mouth 2 (two) times daily. 180 capsule 0  . FLUBLOK QUADRIVALENT 0.5 ML injection     . fluticasone (FLONASE) 50 MCG/ACT nasal spray Place 1 spray into both nostrils daily.  4  . furosemide (LASIX) 20 MG tablet TAKE 1 TABLET BY MOUTH TWICE A WEEK  5  . gabapentin (NEURONTIN) 600 MG tablet Take 1 tablet (600 mg total) by mouth 2 (two) times daily. 180 tablet 1  . medroxyPROGESTERone Acetate 150 MG/ML SUSY Inject 1 mL (150 mg total) into the muscle every 3 (three) months. 1 mL 3  . meloxicam (MOBIC) 7.5 MG tablet Take 7.5 mg by mouth daily.  0  . omeprazole (PRILOSEC) 40 MG capsule Take 40 mg by mouth daily.    . propranolol (INDERAL) 10 MG tablet Take 1 tablet (10 mg total) by mouth 3 (three) times daily as needed. 270 tablet 1  . QUEtiapine (SEROQUEL) 100 MG tablet Take 1 tablet (100 mg total) by  mouth at bedtime. 90 tablet 0  . rosuvastatin (CRESTOR) 20 MG tablet Take 20 mg by mouth daily.    Marland Kitchen topiramate (TOPAMAX) 100 MG tablet Take 1.5 tablets (150 mg total) by mouth daily. 150 MG AM AND 200 MG PM ,BEING PRESCRIBED PER NEUROLOGY 135 tablet 1   No current facility-administered medications for this visit.     Musculoskeletal: Strength & Muscle Tone: UTA Gait & Station: normal Patient leans: N/A  Psychiatric Specialty Exam: Review of Systems  Psychiatric/Behavioral: Positive for dysphoric mood and sleep disturbance.  All other systems reviewed and are negative.   There were no vitals taken for this visit.There is no height or weight on file to calculate BMI.  General Appearance: Casual  Eye Contact:  Fair  Speech:  Clear and Coherent  Volume:  Normal  Mood:  Dysphoric  Affect:  Congruent  Thought Process:  Goal Directed and Descriptions of Associations: Intact  Orientation:  Full (Time, Place, and Person)  Thought Content: Rumination   Suicidal Thoughts:  No  Homicidal Thoughts:  No  Memory:  Immediate;   Fair Recent;   Fair Remote;   Fair  Judgement:  Fair  Insight:  Fair  Psychomotor Activity:  Normal  Concentration:  Concentration: Fair and Attention Span: Fair  Recall:  [employer] of Knowledge: Fair  Language: Fair  Akathisia:  No  Handed:  Right  AIMS (if indicated): UTA  Assets:  Communication Skills Desire for Improvement Housing Social Support  ADL's:  Intact  Cognition: WNL  Sleep:  Poor   Screenings: AUDIT     Office Visit from 05/22/2016 in [place]  Alcohol Use Disorder Identification Test Final Score (AUDIT)  0    GAD-7     Office Visit from 08/10/2019  in [facility]  Total GAD-7 Score  14    PHQ2-9     Office Visit from 08/10/2019 in [facility] Visit from 05/22/2016 in [place]  PHQ-2 Total Score  3  4  PHQ-9 Total Score  15  18        Assessment and Plan: Ziaire is a 49 year old Caucasian female with history of panic attacks, MDD, tobacco use disorder, caffeine use disorder, insomnia was evaluated by telemedicine today.  Patient continues to struggle with depressive symptoms, racing thoughts and sleep issues.  She does have multiple psychosocial stressors and will continue to benefit from psychotherapy sessions.  Plan as noted below.  Plan MDD-some progress Wellbutrin SR 100 mg p.o. twice daily Cymbalta 60 mg p.o. 3 times daily Neurontin 600 mg p.o. twice daily Increase Seroquel to 100 mg p.o. nightly  Panic attacks-stable Cymbalta as prescribed Gabapentin 600 mg p.o. 3 times daily Propranolol 10 mg p.o. 3 times daily as needed  Insomnia-unstable Increase Seroquel to 100 mg p.o. nightly Continue sleep hygiene techniques  Agoraphobia-stable Continue CBT. While she awaits her therapist to return to clinic, advised patient to reach out to [facility] for therapy resources.  Also provided information for therapy resources in the community.  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  We will order the following labs-TSH, lipid panel, hemoglobin A1c and prolactin.  Will mail it to her.  Follow-up in clinic in 4 weeks or sooner if needed.  I have spent atleast 20 minutes non face to face with patient today. More than 50 % of the time was spent for preparing to see the patient ( e.g., review of test, records ),  ordering medications and test ,psychoeducation and supportive psychotherapy and care coordination,as well as documenting clinical information in electronic health record. This note was generated in part or whole with voice recognition software. Voice recognition is usually quite accurate but there are transcription errors that can and very often do occur. I apologize  for any typographical errors that were not detected and corrected.       Ursula Alert, MD 04/06/2020, 12:24 PM

## 2020-04-24 DIAGNOSIS — L97911 Non-pressure chronic ulcer of unspecified part of right lower leg limited to breakdown of skin: Secondary | ICD-10-CM | POA: Diagnosis not present

## 2020-04-24 DIAGNOSIS — E669 Obesity, unspecified: Secondary | ICD-10-CM | POA: Diagnosis not present

## 2020-04-24 DIAGNOSIS — E8881 Metabolic syndrome: Secondary | ICD-10-CM | POA: Diagnosis not present

## 2020-05-02 ENCOUNTER — Telehealth (INDEPENDENT_AMBULATORY_CARE_PROVIDER_SITE_OTHER): Payer: Medicare HMO | Admitting: Psychiatry

## 2020-05-02 ENCOUNTER — Encounter: Payer: Self-pay | Admitting: Psychiatry

## 2020-05-02 ENCOUNTER — Other Ambulatory Visit: Payer: Self-pay

## 2020-05-02 DIAGNOSIS — F339 Major depressive disorder, recurrent, unspecified: Secondary | ICD-10-CM

## 2020-05-02 DIAGNOSIS — F1590 Other stimulant use, unspecified, uncomplicated: Secondary | ICD-10-CM | POA: Diagnosis not present

## 2020-05-02 DIAGNOSIS — F5105 Insomnia due to other mental disorder: Secondary | ICD-10-CM | POA: Diagnosis not present

## 2020-05-02 DIAGNOSIS — F410 Panic disorder [episodic paroxysmal anxiety] without agoraphobia: Secondary | ICD-10-CM

## 2020-05-02 DIAGNOSIS — F17200 Nicotine dependence, unspecified, uncomplicated: Secondary | ICD-10-CM | POA: Diagnosis not present

## 2020-05-02 DIAGNOSIS — F99 Mental disorder, not otherwise specified: Secondary | ICD-10-CM

## 2020-05-02 DIAGNOSIS — F4000 Agoraphobia, unspecified: Secondary | ICD-10-CM

## 2020-05-02 NOTE — Progress Notes (Signed)
Provider Location : [facility] Patient Location : Home  Virtual Visit via Telephone Note  I connected with Mary Koch on 05/02/20 at  9:00 AM EDT by telephone and verified that I am speaking with the correct person using two identifiers.   I discussed the limitations, risks, security and privacy concerns of performing an evaluation and management service by telephone and the availability of in person appointments. I also discussed with the patient that there may be a patient responsible charge related to this service. The patient expressed understanding and agreed to proceed.   I discussed the assessment and treatment plan with the patient. The patient was provided an opportunity to ask questions and all were answered. The patient agreed with the plan and demonstrated an understanding of the instructions.   The patient was advised to call back or seek an in-person evaluation if the symptoms worsen or if the condition fails to improve as anticipated.   [place] MD OP Progress Note  05/02/2020 12:28 PM [place]  MRN:  [id number]  Chief Complaint:  Chief Complaint    Follow-up     HPI: Mary Koch is a 49 year old Caucasian female, lives in [place], has a history of MDD, bereavement, panic disorder, agoraphobia, tobacco use disorder, insomnia, caffeine use disorder was evaluated by telemedicine today.  Patient today reports she is currently recovering from a dental procedure.  She continues to be in pain.  She hence reports she is having difficulty speaking to Probation officer today.  Patient however was able to respond to questions and her speech appeared to be clear.  Patient reports she is currently struggling with excessive sleep during the day.  She reports this did not start with the recent dosage increase of Seroquel.  She has been struggling with this since the past several months.  Patient reports she goes to bed at around 10 PM.  She wakes up after 5 to 6 hours of sleep.  She reports in spite of  feeling rested she does feel tired and sleepy during the day.  Patient reports her mood swings have improved.  She continues to have ups and downs however she is making progress.  She denies any suicidality, homicidality or perceptual disturbances.  She reports due to her recent stressors she may have increased her smoking cigarettes to 4/day.  Previously she was only smoking 2/day.  She was able to do this with the help of Chantix.  Patient however reports she will continue to try to cut back.  Patient denies any other concerns today. Visit Diagnosis:    ICD-10-CM   1. MDD (major depressive disorder), recurrent episode, with atypical features ([place])  F33.9   2. Panic disorder  F41.0   3. Agoraphobia  F40.00   4. Insomnia due to other mental disorder  F51.05    F99   5. Tobacco use disorder  F17.200   6. 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  Past trials of medications like Paxil, Luvox, Klonopin, Zoloft, Seroquel, Wellbutrin.  Past Medical History:  Past Medical History:  Diagnosis Date  . Allergic rhinitis   . Anemia   . Anxiety   . Anxiety and depression   . Carpal tunnel syndrome   . COVID-19 01/15/2020  . Depression   . Fibroids   . GERD (gastroesophageal reflux disease)   . Headache(784.0)   . Headache(784.0)   . Heavy periods   .  Hyperthyroidism    right portion of thyroid removed  . Muscle spasm   . Neurological disorder    evaluation for ms  . Painful menstrual periods   . Shortness of breath    when i smoke a lot    Past Surgical History:  Procedure Laterality Date  . BREAST BIOPSY Left    neg  . carpel tunnel Left 2017  . CESAREAN SECTION     times 2  . CHOLECYSTECTOMY    . THYROID LOBECTOMY      Family Psychiatric History: I have reviewed family psychiatric history from my progress note on 03/24/2018.  Family History:  Family History  Problem  Relation Age of Onset  . Osteoporosis Mother   . Diabetes Father   . Anxiety disorder Father   . Depression Father   . Post-traumatic stress disorder Father   . Heart disease Father   . Anxiety disorder Sister   . Alcohol abuse Brother   . Breast cancer Neg Hx   . Colon cancer Neg Hx   . Ovarian cancer Neg Hx     Social History: I have reviewed social history from my progress note on 03/24/2018. Social History   Socioeconomic History  . Marital status: Legally Separated    Spouse name: Not on file  . Number of children: Not on file  . Years of education: Not on file  . Highest education level: Not on file  Occupational History  . Not on file  Tobacco Use  . Smoking status: Current Every Day Smoker    Packs/day: 0.25    Years: 25.00    Pack years: 6.25    Types: Cigarettes    Start date: 05/22/1986  . Smokeless tobacco: Never Used  . Tobacco comment: 4 per day  Substance and Sexual Activity  . Alcohol use: No    Alcohol/week: 0.0 standard drinks  . Drug use: No  . Sexual activity: Yes    Partners: Male    Birth control/protection: Surgical, Injection  Other Topics Concern  . Not on file  Social History Narrative  . Not on file   Social Determinants of Health   Financial Resource Strain:   . Difficulty of Paying Living Expenses:   Food Insecurity:   . Worried About Charity fundraiser in the Last Year:   . Arboriculturist in the Last Year:   Transportation Needs:   . Film/video editor (Medical):   Marland Kitchen Lack of Transportation (Non-Medical):   Physical Activity:   . Days of Exercise per Week:   . Minutes of Exercise per Session:   Stress:   . Feeling of Stress :   Social Connections:   . Frequency of Communication with Friends and Family:   . Frequency of Social Gatherings with Friends and Family:   . Attends Religious Services:   . Active Member of Clubs or Organizations:   . Attends Archivist Meetings:   Marland Kitchen Marital Status:     Allergies:   Allergies  Allergen Reactions  . Azithromycin Other (See Comments)    headache  . Codeine Other (See Comments)    unknown  . Penicillins Other (See Comments)    headaches    Metabolic Disorder Labs: No results found for: HGBA1C, MPG No results found for: PROLACTIN No results found for: CHOL, TRIG, HDL, CHOLHDL, VLDL, LDLCALC No results found for: TSH  Therapeutic Level Labs: No results found for: LITHIUM No results found for: VALPROATE No components  found for:  CBMZ  Current Medications: Current Outpatient Medications  Medication Sig Dispense Refill  . AFLURIA QUADRIVALENT 0.5 ML injection     . albuterol (PROVENTIL HFA;VENTOLIN HFA) 108 (90 Base) MCG/ACT inhaler INHALE 1 TO 2 PUFFS DAILY    . BOOSTRIX 5-2.5-18.5 LF-MCG/0.5 injection     . BREO ELLIPTA 100-25 MCG/INH AEPB     . buPROPion (WELLBUTRIN SR) 100 MG 12 hr tablet Take 1 tablet (100 mg total) by mouth 2 (two) times daily. 180 tablet 0  . celecoxib (CELEBREX) 100 MG capsule     . CHANTIX CONTINUING MONTH PAK 1 MG tablet TAKE 1 TABLET BY MOUTH TWICE A DAY 168 tablet 0  . cyclobenzaprine (FLEXERIL) 10 MG tablet Take 10 mg by mouth 3 (three) times daily as needed for muscle spasms.    . DULoxetine (CYMBALTA) 60 MG capsule Take 1 capsule (60 mg total) by mouth 2 (two) times daily. 180 capsule 0  . FLUBLOK QUADRIVALENT 0.5 ML injection     . fluticasone (FLONASE) 50 MCG/ACT nasal spray Place 1 spray into both nostrils daily.  4  . furosemide (LASIX) 20 MG tablet TAKE 1 TABLET BY MOUTH TWICE A WEEK  5  . gabapentin (NEURONTIN) 600 MG tablet Take 1 tablet (600 mg total) by mouth 2 (two) times daily. 180 tablet 1  . medroxyPROGESTERone Acetate 150 MG/ML SUSY Inject 1 mL (150 mg total) into the muscle every 3 (three) months. 1 mL 3  . meloxicam (MOBIC) 7.5 MG tablet Take 7.5 mg by mouth daily.  0  . omeprazole (PRILOSEC) 40 MG capsule Take 40 mg by mouth daily.    . propranolol (INDERAL) 10 MG tablet Take 1 tablet (10 mg  total) by mouth 3 (three) times daily as needed. 270 tablet 1  . QUEtiapine (SEROQUEL) 100 MG tablet Take 1 tablet (100 mg total) by mouth at bedtime. 90 tablet 0  . rosuvastatin (CRESTOR) 20 MG tablet Take 20 mg by mouth daily.    Marland Kitchen topiramate (TOPAMAX) 100 MG tablet Take 1.5 tablets (150 mg total) by mouth daily. 150 MG AM AND 200 MG PM ,BEING PRESCRIBED PER NEUROLOGY 135 tablet 1   No current facility-administered medications for this visit.     Musculoskeletal: Strength & Muscle Tone: UTA Gait & Station: Reports as WNL Patient leans: N/A  Psychiatric Specialty Exam: Review of Systems  HENT: Positive for dental problem.   Psychiatric/Behavioral: Positive for dysphoric mood (improving).       Excessive sleep during the day   All other systems reviewed and are negative.   There were no vitals taken for this visit.There is no height or weight on file to calculate BMI.  General Appearance: UTA  Eye Contact:  UTA  Speech:  Clear and Coherent  Volume:  Normal  Mood:  Does have mood swings, improving  Affect:  UTA  Thought Process:  Goal Directed and Descriptions of Associations: Intact  Orientation:  Full (Time, Place, and Person)  Thought Content: Logical   Suicidal Thoughts:  No  Homicidal Thoughts:  No  Memory:  Immediate;   Fair Recent;   Fair Remote;   Fair  Judgement:  Fair  Insight:  Fair  Psychomotor Activity:  UTA  Concentration:  Concentration: Fair and Attention Span: Fair  Recall:  [employer] of Knowledge: Fair  Language: Fair  Akathisia:  No  Handed:  Right  AIMS (if indicated): UTA  Assets:  Communication Skills Desire for Improvement Social Support  ADL's:  Intact  Cognition: WNL  Sleep:  Improving   Screenings: AUDIT     Office Visit from 05/22/2016 in [employer] [facility]  Alcohol Use Disorder Identification Test Final Score (AUDIT)  0    GAD-7     [facility] Visit from 08/10/2019 in [facility]  Total GAD-7 Score  14     PHQ2-9     Office Visit from 08/10/2019 in [facility] Visit from 05/22/2016 in [facility]  PHQ-2 Total Score  3  4  PHQ-9 Total Score  15  18       Assessment and Plan: Shira is a 49 year old Caucasian female with history of panic attacks, MDD, tobacco use disorder, caffeine use disorder, insomnia was evaluated by telemedicine today.  Patient is currently making progress with regards to her mood swings however continues to struggle with excessive sleepiness during the day.  Patient does have multiple psychosocial stressors of her own health issues, relationship struggles.  Plan as noted below.  Plan MDD-improving Wellbutrin SR 100 mg p.o. twice daily Cymbalta 60 mg p.o. twice daily Neurontin 600 mg p.o. twice daily Seroquel 100 mg p.o. nightly  Panic attacks-stable Cymbalta as prescribed Gabapentin 600 mg p.o. twice daily Propranolol 10 mg p.o. 3 times daily as needed  Insomnia-improving Patient reports better sleep at night.  Continue Seroquel 100 mg p.o. nightly Discussed with patient to work on her sleep schedule and to not take naps during the day.  She will try to work on sleep hygiene and if she continues to struggle with sleep issues like excessive sleepiness during the day will recommend repeat sleep study.  Patient reports she had it done a few years ago and it was within normal limits.  Agoraphobia-stable Continue CBT, she will reach out to schedule an appointment with therapist.  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  She agrees to get it done soon.  Follow-up in clinic in 4 weeks or sooner if needed.  I have spent atleast 20 minutes non face to face with patient today. More than 50 % of the time was spent for preparing to see  the patient ( e.g., review of test, records ), obtaining and to review and separately obtained history , ordering medications and test ,psychoeducation and supportive psychotherapy and care coordination,as well as documenting clinical information in electronic health record. This note was generated in part or whole with voice recognition software. Voice recognition is usually quite accurate but there are transcription errors that can and very often do occur. I apologize for any typographical errors that were not detected and corrected.        Ursula Alert, MD 05/02/2020, 12:28 PM

## 2020-05-09 ENCOUNTER — Other Ambulatory Visit: Payer: Self-pay | Admitting: *Deleted

## 2020-05-09 MED ORDER — BREO ELLIPTA 100-25 MCG/INH IN AEPB: 1.0000 | INHALATION_SPRAY | Freq: Every day | RESPIRATORY_TRACT | 4 refills | Status: DC

## 2020-05-12 ENCOUNTER — Ambulatory Visit (INDEPENDENT_AMBULATORY_CARE_PROVIDER_SITE_OTHER): Payer: Medicare HMO | Admitting: Certified Nurse Midwife

## 2020-05-12 ENCOUNTER — Encounter: Payer: Self-pay | Admitting: Certified Nurse Midwife

## 2020-05-12 ENCOUNTER — Other Ambulatory Visit: Payer: Self-pay

## 2020-05-12 ENCOUNTER — Ambulatory Visit: Payer: Medicare HMO

## 2020-05-12 VITALS — BP 134/84 | HR 95 | Ht 66.0 in | Wt 264.4 lb

## 2020-05-12 DIAGNOSIS — Z01419 Encounter for gynecological examination (general) (routine) without abnormal findings: Secondary | ICD-10-CM | POA: Diagnosis not present

## 2020-05-12 DIAGNOSIS — Z3042 Encounter for surveillance of injectable contraceptive: Secondary | ICD-10-CM

## 2020-05-12 MED ORDER — MEDROXYPROGESTERONE ACETATE 150 MG/ML IM SUSP
150.0000 mg | Freq: Once | INTRAMUSCULAR | Status: AC
  Administered 2020-05-12: 150 mg via INTRAMUSCULAR

## 2020-05-12 NOTE — Patient Instructions (Signed)
Medroxyprogesterone injection [Contraceptive] What is this medicine? MEDROXYPROGESTERONE (me DROX ee proe JES te rone) contraceptive injections prevent pregnancy. They provide effective birth control for 3 months. Depo-subQ Provera 104 is also used for treating pain related to endometriosis. This medicine may be used for other purposes; ask your health care provider or pharmacist if you have questions. COMMON BRAND NAME(S): Depo-Provera, Depo-subQ Provera 104 What should I tell my health care provider before I take this medicine? They need to know if you have any of these conditions:  frequently drink alcohol  asthma  blood vessel disease or a history of a blood clot in the lungs or legs  bone disease such as osteoporosis  breast cancer  diabetes  eating disorder (anorexia nervosa or bulimia)  high blood pressure  HIV infection or AIDS  kidney disease  liver disease  mental depression  migraine  seizures (convulsions)  stroke  tobacco smoker  vaginal bleeding  an unusual or allergic reaction to medroxyprogesterone, other hormones, medicines, foods, dyes, or preservatives  pregnant or trying to get pregnant  breast-feeding How should I use this medicine? Depo-Provera Contraceptive injection is given into a muscle. Depo-subQ Provera 104 injection is given under the skin. These injections are given by a health care professional. You must not be pregnant before getting an injection. The injection is usually given during the first 5 days after the start of a menstrual period or 6 weeks after delivery of a baby. Talk to your pediatrician regarding the use of this medicine in children. Special care may be needed. These injections have been used in female children who have started having menstrual periods. Overdosage: If you think you have taken too much of this medicine contact a poison control center or emergency room at once. NOTE: This medicine is only for you. Do not  share this medicine with others. What if I miss a dose? Try not to miss a dose. You must get an injection once every 3 months to maintain birth control. If you cannot keep an appointment, call and reschedule it. If you wait longer than 13 weeks between Depo-Provera contraceptive injections or longer than 14 weeks between Depo-subQ Provera 104 injections, you could get pregnant. Use another method for birth control if you miss your appointment. You may also need a pregnancy test before receiving another injection. What may interact with this medicine? Do not take this medicine with any of the following medications:  bosentan This medicine may also interact with the following medications:  aminoglutethimide  antibiotics or medicines for infections, especially rifampin, rifabutin, rifapentine, and griseofulvin  aprepitant  barbiturate medicines such as phenobarbital or primidone  bexarotene  carbamazepine  medicines for seizures like ethotoin, felbamate, oxcarbazepine, phenytoin, topiramate  modafinil  St. John's wort This list may not describe all possible interactions. Give your health care provider a list of all the medicines, herbs, non-prescription drugs, or dietary supplements you use. Also tell them if you smoke, drink alcohol, or use illegal drugs. Some items may interact with your medicine. What should I watch for while using this medicine? This drug does not protect you against HIV infection (AIDS) or other sexually transmitted diseases. Use of this product may cause you to lose calcium from your bones. Loss of calcium may cause weak bones (osteoporosis). Only use this product for more than 2 years if other forms of birth control are not right for you. The longer you use this product for birth control the more likely you will be at risk  for weak bones. Ask your health care professional how you can keep strong bones. You may have a change in bleeding pattern or irregular periods.  Many females stop having periods while taking this drug. If you have received your injections on time, your chance of being pregnant is very low. If you think you may be pregnant, see your health care professional as soon as possible. Tell your health care professional if you want to get pregnant within the next year. The effect of this medicine may last a long time after you get your last injection. What side effects may I notice from receiving this medicine? Side effects that you should report to your doctor or health care professional as soon as possible:  allergic reactions like skin rash, itching or hives, swelling of the face, lips, or tongue  breast tenderness or discharge  breathing problems  changes in vision  depression  feeling faint or lightheaded, falls  fever  pain in the abdomen, chest, groin, or leg  problems with balance, talking, walking  unusually weak or tired  yellowing of the eyes or skin Side effects that usually do not require medical attention (report to your doctor or health care professional if they continue or are bothersome):  acne  fluid retention and swelling  headache  irregular periods, spotting, or absent periods  temporary pain, itching, or skin reaction at site where injected  weight gain This list may not describe all possible side effects. Call your doctor for medical advice about side effects. You may report side effects to FDA at 1-800-FDA-1088. Where should I keep my medicine? This does not apply. The injection will be given to you by a health care professional. NOTE: This sheet is a summary. It may not cover all possible information. If you have questions about this medicine, talk to your doctor, pharmacist, or health care provider.  2020 Elsevier/Gold Standard (2009-01-06 18:37:56)   Preventive Care 54-66 Years Old, Female Preventive care refers to visits with your health care provider and lifestyle choices that can promote  health and wellness. This includes:  A yearly physical exam. This may also be called an annual well check.  Regular dental visits and eye exams.  Immunizations.  Screening for certain conditions.  Healthy lifestyle choices, such as eating a healthy diet, getting regular exercise, not using drugs or products that contain nicotine and tobacco, and limiting alcohol use. What can I expect for my preventive care visit? Physical exam Your health care provider will check your:  Height and weight. This may be used to calculate body mass index (BMI), which tells if you are at a healthy weight.  Heart rate and blood pressure.  Skin for abnormal spots. Counseling Your health care provider may ask you questions about your:  Alcohol, tobacco, and drug use.  Emotional well-being.  Home and relationship well-being.  Sexual activity.  Eating habits.  Work and work Statistician.  Method of birth control.  Menstrual cycle.  Pregnancy history. What immunizations do I need?  Influenza (flu) vaccine  This is recommended every year. Tetanus, diphtheria, and pertussis (Tdap) vaccine  You may need a Td booster every 10 years. Varicella (chickenpox) vaccine  You may need this if you have not been vaccinated. Zoster (shingles) vaccine  You may need this after age 11. Measles, mumps, and rubella (MMR) vaccine  You may need at least one dose of MMR if you were born in 1957 or later. You may also need a second dose. Pneumococcal conjugate (  PCV13) vaccine  You may need this if you have certain conditions and were not previously vaccinated. Pneumococcal polysaccharide (PPSV23) vaccine  You may need one or two doses if you smoke cigarettes or if you have certain conditions. Meningococcal conjugate (MenACWY) vaccine  You may need this if you have certain conditions. Hepatitis A vaccine  You may need this if you have certain conditions or if you travel or work in places where you may  be exposed to hepatitis A. Hepatitis B vaccine  You may need this if you have certain conditions or if you travel or work in places where you may be exposed to hepatitis B. Haemophilus influenzae type b (Hib) vaccine  You may need this if you have certain conditions. Human papillomavirus (HPV) vaccine  If recommended by your health care provider, you may need three doses over 6 months. You may receive vaccines as individual doses or as more than one vaccine together in one shot (combination vaccines). Talk with your health care provider about the risks and benefits of combination vaccines. What tests do I need? Blood tests  Lipid and cholesterol levels. These may be checked every 5 years, or more frequently if you are over 81 years old.  Hepatitis C test.  Hepatitis B test. Screening  Lung cancer screening. You may have this screening every year starting at age 77 if you have a 30-pack-year history of smoking and currently smoke or have quit within the past 15 years.  Colorectal cancer screening. All adults should have this screening starting at age 54 and continuing until age 9. Your health care provider may recommend screening at age 109 if you are at increased risk. You will have tests every 1-10 years, depending on your results and the type of screening test.  Diabetes screening. This is done by checking your blood sugar (glucose) after you have not eaten for a while (fasting). You may have this done every 1-3 years.  Mammogram. This may be done every 1-2 years. Talk with your health care provider about when you should start having regular mammograms. This may depend on whether you have a family history of breast cancer.  BRCA-related cancer screening. This may be done if you have a family history of breast, ovarian, tubal, or peritoneal cancers.  Pelvic exam and Pap test. This may be done every 3 years starting at age 28. Starting at age 52, this may be done every 5 years if you  have a Pap test in combination with an HPV test. Other tests  Sexually transmitted disease (STD) testing.  Bone density scan. This is done to screen for osteoporosis. You may have this scan if you are at high risk for osteoporosis. Follow these instructions at home: Eating and drinking  Eat a diet that includes fresh fruits and vegetables, whole grains, lean protein, and low-fat dairy.  Take vitamin and mineral supplements as recommended by your health care provider.  Do not drink alcohol if: ? Your health care provider tells you not to drink. ? You are pregnant, may be pregnant, or are planning to become pregnant.  If you drink alcohol: ? Limit how much you have to 0-1 drink a day. ? Be aware of how much alcohol is in your drink. In the [place] one drink equals one 12 oz bottle of beer (355 mL), one 5 oz glass of wine (148 mL), or one 1 oz glass of hard liquor (44 mL). Lifestyle  Take daily care of your teeth and gums.  Stay active. Exercise for at least 30 minutes on 5 or more days each week.  Do not use any products that contain nicotine or tobacco, such as cigarettes, e-cigarettes, and chewing tobacco. If you need help quitting, ask your health care provider.  If you are sexually active, practice safe sex. Use a condom or other form of birth control (contraception) in order to prevent pregnancy and STIs (sexually transmitted infections).  If told by your health care provider, take low-dose aspirin daily starting at age 33. What's next?  Visit your health care provider once a year for a well check visit.  Ask your health care provider how often you should have your eyes and teeth checked.  Stay up to date on all vaccines. This information is not intended to replace advice given to you by your health care provider. Make sure you discuss any questions you have with your health care provider. Document Revised: 08/27/2018 Document Reviewed: 08/27/2018 Elsevier Patient Education   2020 [employer].

## 2020-05-12 NOTE — Progress Notes (Signed)
Pt present for depo injection.   Date last pap: 10/30/2017. Last Depo-Provera: 02/25/2020 Side Effects if any: none Serum HCG indicated? n/a Depo-Provera 150 mg IM given by: FHampton, LPN Next appointment due July 30-Aug 10, 2020  BP 134/84   Pulse 95   Ht 5\' 6"  (1.676 m)   Wt 264 lb 6.4 oz (119.9 kg)   BMI 42.68 kg/m

## 2020-05-15 NOTE — Progress Notes (Signed)
I have reviewed the record and concur with patient management and plan of care.   Diona Fanti, CNM [facility], [facility] 05/15/20 1:10 PM

## 2020-05-18 ENCOUNTER — Other Ambulatory Visit: Payer: Self-pay | Admitting: Psychiatry

## 2020-05-18 DIAGNOSIS — F410 Panic disorder [episodic paroxysmal anxiety] without agoraphobia: Secondary | ICD-10-CM

## 2020-05-18 DIAGNOSIS — F339 Major depressive disorder, recurrent, unspecified: Secondary | ICD-10-CM

## 2020-05-23 DIAGNOSIS — F5104 Psychophysiologic insomnia: Secondary | ICD-10-CM | POA: Diagnosis not present

## 2020-05-23 DIAGNOSIS — G5603 Carpal tunnel syndrome, bilateral upper limbs: Secondary | ICD-10-CM | POA: Diagnosis not present

## 2020-05-23 DIAGNOSIS — R0683 Snoring: Secondary | ICD-10-CM | POA: Diagnosis not present

## 2020-05-23 DIAGNOSIS — G4719 Other hypersomnia: Secondary | ICD-10-CM | POA: Diagnosis not present

## 2020-05-23 DIAGNOSIS — G43119 Migraine with aura, intractable, without status migrainosus: Secondary | ICD-10-CM | POA: Diagnosis not present

## 2020-05-30 ENCOUNTER — Other Ambulatory Visit: Payer: Self-pay

## 2020-05-30 ENCOUNTER — Encounter: Payer: Self-pay | Admitting: Psychiatry

## 2020-05-30 ENCOUNTER — Telehealth (INDEPENDENT_AMBULATORY_CARE_PROVIDER_SITE_OTHER): Payer: Medicare HMO | Admitting: Psychiatry

## 2020-05-30 DIAGNOSIS — F99 Mental disorder, not otherwise specified: Secondary | ICD-10-CM | POA: Diagnosis not present

## 2020-05-30 DIAGNOSIS — F5105 Insomnia due to other mental disorder: Secondary | ICD-10-CM

## 2020-05-30 DIAGNOSIS — F4000 Agoraphobia, unspecified: Secondary | ICD-10-CM | POA: Diagnosis not present

## 2020-05-30 DIAGNOSIS — F17200 Nicotine dependence, unspecified, uncomplicated: Secondary | ICD-10-CM

## 2020-05-30 DIAGNOSIS — F3341 Major depressive disorder, recurrent, in partial remission: Secondary | ICD-10-CM | POA: Diagnosis not present

## 2020-05-30 DIAGNOSIS — F1590 Other stimulant use, unspecified, uncomplicated: Secondary | ICD-10-CM

## 2020-05-30 DIAGNOSIS — F410 Panic disorder [episodic paroxysmal anxiety] without agoraphobia: Secondary | ICD-10-CM

## 2020-05-30 MED ORDER — QUETIAPINE FUMARATE 100 MG PO TABS: 150.0000 mg | ORAL_TABLET | Freq: Every day | ORAL | 0 refills | Status: DC

## 2020-05-30 MED ORDER — BUPROPION HCL ER (SR) 100 MG PO TB12: 100.0000 mg | ORAL_TABLET | Freq: Two times a day (BID) | ORAL | 0 refills | Status: DC

## 2020-05-30 NOTE — Progress Notes (Signed)
Provider Location : [facility] Patient Location : Home  Virtual Visit via Video Note  I connected with Mary Koch on 05/30/20 at  8:30 AM EDT by a video enabled telemedicine application and verified that I am speaking with the correct person using two identifiers.   I discussed the limitations of evaluation and management by telemedicine and the availability of in person appointments. The patient expressed understanding and agreed to proceed.    I discussed the assessment and treatment plan with the patient. The patient was provided an opportunity to ask questions and all were answered. The patient agreed with the plan and demonstrated an understanding of the instructions.   The patient was advised to call back or seek an in-person evaluation if the symptoms worsen or if the condition fails to improve as anticipated.  Bernard MD OP Progress Note  05/30/2020 9:25 PM Mary Koch  MRN:  [id number]  Chief Complaint:  Chief Complaint    Follow-up     HPI: Mary Koch is a 49 year old Caucasian female, lives in [place], has a history of MDD, bereavement, panic disorder, agoraphobia, tobacco use disorder, insomnia, caffeine use disorder was evaluated by telemedicine today.  Patient today reports that she had recent dental procedure done, her front teeth were extracted and she got partial implants done.  She reports her gums continues to be sore and that does makes her difficult to eat and makes her restless.  She reports this does affect her sleep.  She is currently on Seroquel which helps to some extent.  She otherwise reports her depression and anxiety symptoms is improving on the current medication regimen.  Patient denies any suicidality homicidality or perceptual disturbances.  Patient denies any other concerns today.  Visit Diagnosis:    ICD-10-CM   1. MDD (major depressive disorder), recurrent, in partial remission (HCC)  F33.41 QUEtiapine (SEROQUEL) 100 MG tablet    buPROPion  (WELLBUTRIN SR) 100 MG 12 hr tablet  2. Panic disorder  F41.0 QUEtiapine (SEROQUEL) 100 MG tablet  3. Agoraphobia  F40.00   4. Insomnia due to other mental disorder  F51.05 QUEtiapine (SEROQUEL) 100 MG tablet   F99   5. Tobacco use disorder  F17.200   6. 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  Past trials of medications like Paxil, Luvox, Klonopin, Zoloft, Seroquel, Wellbutrin  Past Medical History:  Past Medical History:  Diagnosis Date  . Allergic rhinitis   . Anemia   . Anxiety   . Anxiety and depression   . Carpal tunnel syndrome   . COVID-19 01/15/2020  . Depression   . Fibroids   . GERD (gastroesophageal reflux disease)   . Headache(784.0)   . Headache(784.0)   . Heavy periods   . Hyperthyroidism    right portion of thyroid removed  . Muscle spasm   . Neurological disorder    evaluation for ms  . Painful menstrual periods   . Shortness of breath    when i smoke a lot    Past Surgical History:  Procedure Laterality Date  . BREAST BIOPSY Left    neg  . carpel tunnel Left 2017  . CESAREAN SECTION     times 2  . CHOLECYSTECTOMY    . THYROID LOBECTOMY      Family Psychiatric History: I have reviewed family psychiatric history from my progress note on 03/24/2018  Family History:  Family  History  Problem Relation Age of Onset  . Osteoporosis Mother   . Diabetes Father   . Anxiety disorder Father   . Depression Father   . Post-traumatic stress disorder Father   . Heart disease Father   . Anxiety disorder Sister   . Alcohol abuse Brother   . Breast cancer Neg Hx   . Colon cancer Neg Hx   . Ovarian cancer Neg Hx     Social History: I have reviewed social history from my progress note on 03/24/2018 Social History   Socioeconomic History  . Marital status: Legally Separated    Spouse name: Not on file  . Number of children: Not on file  . Years of education:  Not on file  . Highest education level: Not on file  Occupational History  . Not on file  Tobacco Use  . Smoking status: Current Every Day Smoker    Packs/day: 0.25    Years: 25.00    Pack years: 6.25    Types: Cigarettes    Start date: 05/22/1986  . Smokeless tobacco: Never Used  . Tobacco comment: 4 per day  Substance and Sexual Activity  . Alcohol use: No    Alcohol/week: 0.0 standard drinks  . Drug use: No  . Sexual activity: Yes    Partners: Male    Birth control/protection: Surgical, Injection  Other Topics Concern  . Not on file  Social History Narrative  . Not on file   Social Determinants of Health   Financial Resource Strain:   . Difficulty of Paying Living Expenses:   Food Insecurity:   . Worried About Charity fundraiser in the Last Year:   . Arboriculturist in the Last Year:   Transportation Needs:   . Film/video editor (Medical):   Marland Kitchen Lack of Transportation (Non-Medical):   Physical Activity:   . Days of Exercise per Week:   . Minutes of Exercise per Session:   Stress:   . Feeling of Stress :   Social Connections:   . Frequency of Communication with Friends and Family:   . Frequency of Social Gatherings with Friends and Family:   . Attends Religious Services:   . Active Member of Clubs or Organizations:   . Attends Archivist Meetings:   Marland Kitchen Marital Status:     Allergies:  Allergies  Allergen Reactions  . Azithromycin Other (See Comments)    headache  . Codeine Other (See Comments)    unknown  . Penicillins Other (See Comments)    headaches    Metabolic Disorder Labs: No results found for: HGBA1C, MPG No results found for: PROLACTIN No results found for: CHOL, TRIG, HDL, CHOLHDL, VLDL, LDLCALC No results found for: TSH  Therapeutic Level Labs: No results found for: LITHIUM No results found for: VALPROATE No components found for:  CBMZ  Current Medications: Current Outpatient Medications  Medication Sig Dispense Refill   . AFLURIA QUADRIVALENT 0.5 ML injection     . albuterol (PROVENTIL HFA;VENTOLIN HFA) 108 (90 Base) MCG/ACT inhaler INHALE 1 TO 2 PUFFS DAILY    . BOOSTRIX 5-2.5-18.5 LF-MCG/0.5 injection     . BREO ELLIPTA 100-25 MCG/INH AEPB Inhale 1 puff into the lungs daily. 28 each 4  . buPROPion (WELLBUTRIN SR) 100 MG 12 hr tablet Take 1 tablet (100 mg total) by mouth 2 (two) times daily. 180 tablet 0  . celecoxib (CELEBREX) 100 MG capsule     . CHANTIX CONTINUING MONTH PAK 1  MG tablet TAKE 1 TABLET BY MOUTH TWICE A DAY 168 tablet 0  . clindamycin (CLEOCIN) 150 MG capsule     . cyclobenzaprine (FLEXERIL) 10 MG tablet Take 10 mg by mouth 3 (three) times daily as needed for muscle spasms.    . DULoxetine (CYMBALTA) 60 MG capsule Take 1 capsule (60 mg total) by mouth 2 (two) times daily. 180 capsule 0  . FLUBLOK QUADRIVALENT 0.5 ML injection     . fluticasone (FLONASE) 50 MCG/ACT nasal spray Place 1 spray into both nostrils daily.  4  . furosemide (LASIX) 20 MG tablet TAKE 1 TABLET BY MOUTH TWICE A WEEK  5  . gabapentin (NEURONTIN) 600 MG tablet Take 1 tablet (600 mg total) by mouth 2 (two) times daily. 180 tablet 1  . medroxyPROGESTERone Acetate 150 MG/ML SUSY Inject 1 mL (150 mg total) into the muscle every 3 (three) months. 1 mL 3  . meloxicam (MOBIC) 7.5 MG tablet Take 7.5 mg by mouth daily.  0  . omeprazole (PRILOSEC) 40 MG capsule Take 40 mg by mouth daily.    . propranolol (INDERAL) 10 MG tablet Take 1 tablet (10 mg total) by mouth 3 (three) times daily as needed. 270 tablet 1  . QUEtiapine (SEROQUEL) 100 MG tablet Take 1.5 tablets (150 mg total) by mouth at bedtime. 135 tablet 0  . rosuvastatin (CRESTOR) 20 MG tablet Take 20 mg by mouth daily.    Marland Kitchen topiramate (TOPAMAX) 100 MG tablet Take 1.5 tablets (150 mg total) by mouth daily. 150 MG AM AND 200 MG PM ,BEING PRESCRIBED PER NEUROLOGY 135 tablet 1   No current facility-administered medications for this visit.     Musculoskeletal: Strength &  Muscle Tone: UTA Gait & Station: normal Patient leans: N/A  Psychiatric Specialty Exam: Review of Systems  HENT: Positive for dental problem.   Psychiatric/Behavioral: Positive for dysphoric mood and sleep disturbance. The patient is nervous/anxious.   All other systems reviewed and are negative.   There were no vitals taken for this visit.There is no height or weight on file to calculate BMI.  General Appearance: Casual  Eye Contact:  Fair  Speech:  Normal Rate  Volume:  Normal  Mood:  Anxious and Depressed improving  Affect:  Congruent  Thought Process:  Goal Directed and Descriptions of Associations: Intact  Orientation:  Full (Time, Place, and Person)  Thought Content: Logical   Suicidal Thoughts:  No  Homicidal Thoughts:  No  Memory:  Immediate;   Fair Recent;   Fair Remote;   Fair  Judgement:  Fair  Insight:  Fair  Psychomotor Activity:  Normal  Concentration:  Concentration: Fair and Attention Span: Fair  Recall:  [employer] of Knowledge: Fair  Language: Fair  Akathisia:  No  Handed:  Right  AIMS (if indicated): uta  Assets:  Communication Skills Desire for Improvement Housing  ADL's:  Intact  Cognition: WNL  Sleep:  Poor   Screenings: AUDIT     Office Visit from 05/22/2016 in [facility]  Alcohol Use Disorder Identification Test Final Score (AUDIT)  0    GAD-7     [facility] Visit from 08/10/2019 in [facility]  Total GAD-7 Score  14    PHQ2-9     Office Visit from 08/10/2019 in [facility] Visit from 05/22/2016 in [place]  PHQ-2 Total Score  3  4  PHQ-9 Total Score  15  18  Assessment and Plan: Brettney Sanderlin is a 49 year old Caucasian female with history of panic attacks, MDD, tobacco use disorder, caffeine use disorder, insomnia was evaluated by telemedicine today.  Patient is currently struggling with sleep problems as well as pain due to recent dental  procedure.  She will benefit from the following medication readjustment.  Plan as noted below.  Plan MDD-in partial remission Wellbutrin 100 mg p.o. twice daily Cymbalta 60 mg p.o. twice daily Neurontin 600 mg p.o. twice daily Increase Seroquel to 150 mg p.o. nightly  Panic attacks-stable Cymbalta as prescribed Gabapentin 600 mg p.o. twice daily Propranolol 10 mg p.o. 3 times daily as needed  Insomnia-restless Increase Seroquel to 150 mg p.o. nightly Sleep problems are also due to her pain.  Agoraphobia-stable Continue CBT.  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  I have spent atleast 20 minutes non face to face with patient today. More than 50 % of the time was spent for preparing to see the patient ( e.g., review of test, records ),  , ordering medications and test ,psychoeducation and supportive psychotherapy and care coordination,as well as documenting clinical information in electronic health record. This note was generated in part or whole with voice recognition software. Voice recognition is usually quite accurate but there are transcription errors that can and very often do occur. I apologize for any typographical errors that were not detected and corrected.       Ursula Alert, MD 05/30/2020, 9:25 PM

## 2020-06-30 DIAGNOSIS — L299 Pruritus, unspecified: Secondary | ICD-10-CM | POA: Diagnosis not present

## 2020-06-30 DIAGNOSIS — Z139 Encounter for screening, unspecified: Secondary | ICD-10-CM | POA: Diagnosis not present

## 2020-06-30 DIAGNOSIS — A681 Tick-borne relapsing fever: Secondary | ICD-10-CM | POA: Diagnosis not present

## 2020-06-30 DIAGNOSIS — W57XXXA Bitten or stung by nonvenomous insect and other nonvenomous arthropods, initial encounter: Secondary | ICD-10-CM | POA: Diagnosis not present

## 2020-06-30 DIAGNOSIS — M542 Cervicalgia: Secondary | ICD-10-CM | POA: Diagnosis not present

## 2020-07-05 ENCOUNTER — Ambulatory Visit (INDEPENDENT_AMBULATORY_CARE_PROVIDER_SITE_OTHER): Payer: Medicare HMO | Admitting: Licensed Clinical Social Worker

## 2020-07-05 ENCOUNTER — Encounter: Payer: Self-pay | Admitting: Licensed Clinical Social Worker

## 2020-07-05 ENCOUNTER — Other Ambulatory Visit: Payer: Self-pay

## 2020-07-05 DIAGNOSIS — F4000 Agoraphobia, unspecified: Secondary | ICD-10-CM

## 2020-07-05 DIAGNOSIS — F410 Panic disorder [episodic paroxysmal anxiety] without agoraphobia: Secondary | ICD-10-CM

## 2020-07-05 NOTE — Progress Notes (Signed)
Patient Location: Home  Provider Location: Home Office   Virtual Visit via Video Note  I connected with Mary Koch on 07/05/20 at  4:00 PM EDT by a video enabled telemedicine application and verified that I am speaking with the correct person using two identifiers.   I discussed the limitations of evaluation and management by telemedicine and the availability of in person appointments. The patient expressed understanding and agreed to proceed.   THERAPY PROGRESS NOTE  Session Time: 46 Minutes  Participation Level: Active  Behavioral Response: CasualAlertAnxious  Type of Therapy: Individual Therapy  Treatment Goals addressed: Anxiety and Coping  Interventions: Motivational Interviewing  Summary: Mary Koch is a 49 y.o. female who presents with panic disorder with agoraphobia. Pt was receptive to feedback on progression towards goals and provided her perspective on where she is progressing and not. Pt reported that she has a better understanding and awareness of triggers of her anxiety and the negative thinking that exacerbates her worry and avoidance. Pt reported some attempts to leave the home and was able to attend a recent needed dental surgery and trips to the dollar store at night when less people are present for some groceries. Pt identified new stressors and feels that her anxiety has become worse in way, but struggled to explain s/ "I don't know how to explain it". Pt was receptive to discussion on stages of change and agreed to reflect on for homework.   Suicidal/Homicidal: No  Therapist Response: Therapist met with patient for follow up session after returning from 12 weeks of leave. Therapist and patient reviewed progress towards goals and updated treatment plan to reflect progress made and goals that have been continued/revised. Pt in agreement. Therapist encouraged patient to identify what has and has not changed since last session. Therapist provided psycho-education  on stages of change and therapist assessment of where patient is currently in the change process - preparation phase.  Plan: Return again in 2 weeks.  Diagnosis: Axis I: Panic Disorder    Axis II: N/A  Follow Up Instructions:   I discussed the treatment plan with the patient. The patient was provided an opportunity to ask questions and all were answered. The patient agreed with the plan and demonstrated an understanding of the instructions.   The patient was advised to call back or seek an in-person evaluation if the symptoms worsen or if the condition fails to improve as anticipated.  I provided 45 minutes of non-face-to-face time during this encounter.   Hokulani Rogel Wynelle Link, LCSW, LCAS

## 2020-07-10 ENCOUNTER — Encounter: Payer: Self-pay | Admitting: Internal Medicine

## 2020-07-10 ENCOUNTER — Ambulatory Visit (INDEPENDENT_AMBULATORY_CARE_PROVIDER_SITE_OTHER): Payer: Medicare HMO | Admitting: Internal Medicine

## 2020-07-10 ENCOUNTER — Other Ambulatory Visit: Payer: Self-pay

## 2020-07-10 VITALS — BP 142/84 | HR 89 | Ht 68.0 in | Wt 258.0 lb

## 2020-07-10 DIAGNOSIS — W57XXXA Bitten or stung by nonvenomous insect and other nonvenomous arthropods, initial encounter: Secondary | ICD-10-CM | POA: Diagnosis not present

## 2020-07-10 DIAGNOSIS — R5383 Other fatigue: Secondary | ICD-10-CM

## 2020-07-10 DIAGNOSIS — H538 Other visual disturbances: Secondary | ICD-10-CM

## 2020-07-10 DIAGNOSIS — Z720 Tobacco use: Secondary | ICD-10-CM

## 2020-07-10 DIAGNOSIS — R638 Other symptoms and signs concerning food and fluid intake: Secondary | ICD-10-CM

## 2020-07-10 LAB — GLUCOSE, POCT (MANUAL RESULT ENTRY): POC Glucose: 76 mg/dl (ref 70–99)

## 2020-07-10 NOTE — Progress Notes (Addendum)
Established Patient Office Visit  SUBJECTIVE:  Subjective  Patient ID: Mary Koch, female    DOB: 01/17/1971  Age: 49 y.o. MRN: [id number]  CC:  Chief Complaint  Patient presents with  . Tick Removal    patient here today for recheck after tick bite, she has been havng blurred vision and headaches. States that she has also had some low grade fevers     HPI Mary Koch is a 49 y.o. female presenting today for an evaluation of a recent tick bite.   She notes that she has had a headache, blurred vision, and dizziness. She also noted a low grade fever. She went to the urgent care and she was checked for [facility] Spotted Fever and Lyme Disease; those results are not available to [place] at this time.   She also notes that she had a recent fall on some stairs and she is unsure if she hit her head or not.  She does note some pressure at the back of her skull.   Past Medical History:  Diagnosis Date  . Allergic rhinitis   . Anemia   . Anxiety   . Anxiety and depression   . Carpal tunnel syndrome   . COVID-19 01/15/2020  . Depression   . Fibroids   . GERD (gastroesophageal reflux disease)   . Headache(784.0)   . Headache(784.0)   . Heavy periods   . Hyperthyroidism    right portion of thyroid removed  . Muscle spasm   . Neurological disorder    evaluation for ms  . Painful menstrual periods   . Shortness of breath    when i smoke a lot    Past Surgical History:  Procedure Laterality Date  . BREAST BIOPSY Left    neg  . carpel tunnel Left 2017  . CESAREAN SECTION     times 2  . CHOLECYSTECTOMY    . THYROID LOBECTOMY      Family History  Problem Relation Age of Onset  . Osteoporosis Mother   . Diabetes Father   . Anxiety disorder Father   . Depression Father   . Post-traumatic stress disorder Father   . Heart disease Father   . Anxiety disorder Sister   . Alcohol abuse Brother   . Breast cancer Neg Hx   . Colon cancer Neg Hx   . Ovarian cancer Neg Hx      Social History   Socioeconomic History  . Marital status: Legally Separated    Spouse name: Not on file  . Number of children: Not on file  . Years of education: Not on file  . Highest education level: Not on file  Occupational History  . Not on file  Tobacco Use  . Smoking status: Current Every Day Smoker    Packs/day: 0.25    Years: 25.00    Pack years: 6.25    Types: Cigarettes    Start date: 05/22/1986  . Smokeless tobacco: Never Used  . Tobacco comment: 4 per day  Vaping Use  . Vaping Use: Never used  Substance and Sexual Activity  . Alcohol use: No    Alcohol/week: 0.0 standard drinks  . Drug use: No  . Sexual activity: Yes    Partners: Male    Birth control/protection: Surgical, Injection  Other Topics Concern  . Not on file  Social History Narrative  . Not on file   Social Determinants of Health   Financial Resource Strain:   . Difficulty  of Paying Living Expenses:   Food Insecurity:   . Worried About Charity fundraiser in the Last Year:   . Arboriculturist in the Last Year:   Transportation Needs:   . Film/video editor (Medical):   Marland Kitchen Lack of Transportation (Non-Medical):   Physical Activity:   . Days of Exercise per Week:   . Minutes of Exercise per Session:   Stress:   . Feeling of Stress :   Social Connections:   . Frequency of Communication with Friends and Family:   . Frequency of Social Gatherings with Friends and Family:   . Attends Religious Services:   . Active Member of Clubs or Organizations:   . Attends Archivist Meetings:   Marland Kitchen Marital Status:   Intimate Partner Violence:   . Fear of Current or Ex-Partner:   . Emotionally Abused:   Marland Kitchen Physically Abused:   . Sexually Abused:      Current Outpatient Medications:  .  albuterol (PROVENTIL HFA;VENTOLIN HFA) 108 (90 Base) MCG/ACT inhaler, INHALE 1 TO 2 PUFFS DAILY, Disp: , Rfl:  .  BREO ELLIPTA 100-25 MCG/INH AEPB, Inhale 1 puff into the lungs daily., Disp: 28 each,  Rfl: 4 .  buPROPion (WELLBUTRIN SR) 100 MG 12 hr tablet, Take 1 tablet (100 mg total) by mouth 2 (two) times daily., Disp: 180 tablet, Rfl: 0 .  celecoxib (CELEBREX) 100 MG capsule, , Disp: , Rfl:  .  cyclobenzaprine (FLEXERIL) 10 MG tablet, Take 10 mg by mouth 3 (three) times daily as needed for muscle spasms., Disp: , Rfl:  .  fluticasone (FLONASE) 50 MCG/ACT nasal spray, Place 1 spray into both nostrils daily., Disp: , Rfl: 4 .  furosemide (LASIX) 20 MG tablet, TAKE 1 TABLET BY MOUTH TWICE A WEEK, Disp: , Rfl: 5 .  gabapentin (NEURONTIN) 600 MG tablet, Take 1 tablet (600 mg total) by mouth 2 (two) times daily., Disp: 180 tablet, Rfl: 1 .  meloxicam (MOBIC) 7.5 MG tablet, Take 7.5 mg by mouth daily., Disp: , Rfl: 0 .  omeprazole (PRILOSEC) 40 MG capsule, Take 40 mg by mouth daily., Disp: , Rfl:  .  propranolol (INDERAL) 10 MG tablet, Take 1 tablet (10 mg total) by mouth 3 (three) times daily as needed., Disp: 270 tablet, Rfl: 1 .  QUEtiapine (SEROQUEL) 100 MG tablet, Take 1.5 tablets (150 mg total) by mouth at bedtime., Disp: 135 tablet, Rfl: 0 .  rosuvastatin (CRESTOR) 20 MG tablet, Take 20 mg by mouth daily., Disp: , Rfl:  .  topiramate (TOPAMAX) 100 MG tablet, Take 1.5 tablets (150 mg total) by mouth daily. 150 MG AM AND 200 MG PM ,BEING PRESCRIBED PER NEUROLOGY, Disp: 135 tablet, Rfl: 1 .  DULoxetine (CYMBALTA) 60 MG capsule, Take 1 capsule (60 mg total) by mouth 2 (two) times daily., Disp: 180 capsule, Rfl: 0 .  medroxyPROGESTERone Acetate 150 MG/ML SUSY, INJECT 1 ML (150 MG TOTAL) INTO THE MUSCLE EVERY 3 (THREE) MONTHS., Disp: 1 mL, Rfl: 3 .  Ubrogepant 100 MG TABS, Take by mouth., Disp: , Rfl:    Allergies  Allergen Reactions  . Azithromycin Other (See Comments)    headache  . Codeine Other (See Comments)    unknown  . Penicillins Other (See Comments)    headaches    ROS Review of Systems  Constitutional: Positive for fever.  HENT: Negative.   Eyes: Positive for visual  disturbance (Blurred).  Respiratory: Negative.   Cardiovascular: Negative.   Gastrointestinal:  Negative.   Endocrine: Negative.   Genitourinary: Negative.   Musculoskeletal: Negative.   Skin: Negative.   Allergic/Immunologic: Negative.   Neurological: Positive for dizziness and headaches.       Recent Fall  Hematological: Negative.   Psychiatric/Behavioral: Negative.   All other systems reviewed and are negative.    OBJECTIVE:    Physical Exam Vitals reviewed.  Constitutional:      Appearance: Normal appearance.  HENT:     Mouth/Throat:     Mouth: Mucous membranes are moist.  Eyes:     Extraocular Movements: Extraocular movements intact.     Conjunctiva/sclera: Conjunctivae normal.     Pupils: Pupils are equal, round, and reactive to light.  Cardiovascular:     Rate and Rhythm: Normal rate and regular rhythm.     Pulses: Normal pulses.     Heart sounds: Normal heart sounds.  Pulmonary:     Effort: Pulmonary effort is normal.     Breath sounds: Normal breath sounds.  Abdominal:     Palpations: There is no hepatomegaly, splenomegaly or mass.     Tenderness: There is no abdominal tenderness.  Musculoskeletal:     Right lower leg: No edema.     Left lower leg: No edema.  Skin:    Findings: Wound (mid left back 0.5cm) present. No rash.  Neurological:     Mental Status: She is alert and oriented to person, place, and time.  Psychiatric:        Mood and Affect: Mood and affect normal.        Behavior: Behavior normal.     BP (!) 142/84   Pulse 89   Ht 5\' 8"  (1.727 m)   Wt 258 lb (117 kg)   BMI 39.23 kg/m  Wt Readings from Last 3 Encounters:  07/10/20 258 lb (117 kg)  05/12/20 264 lb 6.4 oz (119.9 kg)  02/25/20 267 lb 12.8 oz (121.5 kg)    Health Maintenance Due  Topic Date Due  . Hepatitis C Screening  Never done  . COVID-19 Vaccine (1) Never done  . HIV Screening  Never done    There are no preventive care reminders to display for this patient.  CBC  Latest Ref Rng & Units 05/18/2017 11/11/2012 02/05/2012  WBC 3.6 - 11.0 K/uL 12.8(H) 9.7 11.5(H)  Hemoglobin 12.0 - 16.0 g/dL 13.9 14.3 13.6  Hematocrit 35 - 47 % 40.8 42.0 41.3  Platelets 150 - 440 K/uL 251 253 295   CMP Latest Ref Rng & Units 05/18/2017  Glucose 65 - 99 mg/dL 110(H)  BUN 6 - 20 mg/dL 15  Creatinine 0.44 - 1.00 mg/dL 0.85  Sodium 135 - 145 mmol/L 138  Potassium 3.5 - 5.1 mmol/L 4.5  Chloride 101 - 111 mmol/L 108  CO2 22 - 32 mmol/L 23  Calcium 8.9 - 10.3 mg/dL 9.2  Total Protein 6.5 - 8.1 g/dL 7.3  Total Bilirubin 0.3 - 1.2 mg/dL 0.7  Alkaline Phos 38 - 126 U/L 66  AST 15 - 41 U/L 24  ALT 14 - 54 U/L 18    No results found for: TSH Lab Results  Component Value Date   ALBUMIN 3.7 05/18/2017   ANIONGAP 7 05/18/2017   No results found for: CHOL, HDL, LDLCALC, CHOLHDL No results found for: TRIG No results found for: HGBA1C    ASSESSMENT & PLAN:   Problem List Items Addressed This Visit      Musculoskeletal and Integument   Tick bite  Patient was seen for a tick bite about 7 days ago in the urgent care center.  She has a tick bite on the left back which is healing good.  Particles of the tick were not seen on the wound.  There is no rash spreading from the area.  There is no evidence of cellulitis or lymphadenopathy.  Patient says that she  has lab tests  obtained in the urgent care center but they are not available right now.  Her physical examination is unremarkable without any rash rales rhonchi heart is regular abdomen is nontender.        Other   Increased BMI    Patient has been advised to lose weight.      Tobacco abuse    - I instructed the patient to stop smoking and provided them with smoking cessation materials.  - I informed the patient that smoking puts them at increased risk for cancer, COPD, hypertension, and more.  - Informed the patient to seek help if they begin to have trouble breathing, develop chest pain, start to cough up blood, feel  faint, or pass out.        Blurred vision - Primary    Patient complaining of blurred vision at night to have a follow-up in 1 week.        Relevant Orders   POCT glucose (manual entry) (Completed)   Lethargy    Lethargy  can be due to eithertick bite or viral syndrome.         No orders of the defined types were placed in this encounter.     Follow-up: No follow-ups on file.    Dr. Jane Canary [facility] [address], [place], [place] [address]   By signing my name below, I, [employer], attest that this documentation has been prepared under the direction and in the presence of Cletis Athens, MD. Electronically Signed: Cletis Athens, MD 07/28/20, 4:38 PM   I personally performed the services described in this documentation, which was SCRIBED in my presence. The recorded information has been reviewed and considered accurate. It has been edited as necessary during review. Cletis Athens, MD

## 2020-07-10 NOTE — Assessment & Plan Note (Signed)
Patient complaining of blurred vision at night to have a follow-up in 1 week.

## 2020-07-10 NOTE — Assessment & Plan Note (Signed)
Patient was seen for a tick bite about 7 days ago in the urgent care center.  She has a tick bite on the left back which is healing good.  Particles of the tick were not seen on the wound.  There is no rash spreading from the area.  There is no evidence of cellulitis or lymphadenopathy.  Patient says that she  has lab tests  obtained in the urgent care center but they are not available right now.  Her physical examination is unremarkable without any rash rales rhonchi heart is regular abdomen is nontender.

## 2020-07-10 NOTE — Assessment & Plan Note (Signed)
Patient has been advised to lose weight. 

## 2020-07-10 NOTE — Assessment & Plan Note (Signed)
Lethargy  can be due to eithertick bite or viral syndrome.

## 2020-07-13 ENCOUNTER — Ambulatory Visit: Payer: Medicare HMO | Admitting: Internal Medicine

## 2020-07-19 ENCOUNTER — Encounter: Payer: Medicare HMO | Admitting: Licensed Clinical Social Worker

## 2020-07-19 ENCOUNTER — Other Ambulatory Visit: Payer: Self-pay

## 2020-07-20 NOTE — Progress Notes (Signed)
This encounter was created in error - please disregard.

## 2020-07-25 ENCOUNTER — Encounter: Payer: Self-pay | Admitting: Psychiatry

## 2020-07-25 ENCOUNTER — Telehealth (INDEPENDENT_AMBULATORY_CARE_PROVIDER_SITE_OTHER): Payer: Medicare HMO | Admitting: Psychiatry

## 2020-07-25 ENCOUNTER — Other Ambulatory Visit: Payer: Self-pay

## 2020-07-25 DIAGNOSIS — F1590 Other stimulant use, unspecified, uncomplicated: Secondary | ICD-10-CM

## 2020-07-25 DIAGNOSIS — F3342 Major depressive disorder, recurrent, in full remission: Secondary | ICD-10-CM | POA: Insufficient documentation

## 2020-07-25 DIAGNOSIS — F410 Panic disorder [episodic paroxysmal anxiety] without agoraphobia: Secondary | ICD-10-CM

## 2020-07-25 DIAGNOSIS — F99 Mental disorder, not otherwise specified: Secondary | ICD-10-CM | POA: Diagnosis not present

## 2020-07-25 DIAGNOSIS — F4000 Agoraphobia, unspecified: Secondary | ICD-10-CM

## 2020-07-25 DIAGNOSIS — F5105 Insomnia due to other mental disorder: Secondary | ICD-10-CM

## 2020-07-25 DIAGNOSIS — F17200 Nicotine dependence, unspecified, uncomplicated: Secondary | ICD-10-CM

## 2020-07-25 MED ORDER — DULOXETINE HCL 60 MG PO CPEP: 60.0000 mg | ORAL_CAPSULE | Freq: Two times a day (BID) | ORAL | 0 refills | Status: DC

## 2020-07-25 NOTE — Progress Notes (Signed)
Provider Location : [facility] Patient Location : Home  Virtual Visit via Video Note  I connected with Mary Koch on 07/25/20 at  4:00 PM EDT by a video enabled telemedicine application and verified that I am speaking with the correct person using two identifiers.   I discussed the limitations of evaluation and management by telemedicine and the availability of in person appointments. The patient expressed understanding and agreed to proceed.    I discussed the assessment and treatment plan with the patient. The patient was provided an opportunity to ask questions and all were answered. The patient agreed with the plan and demonstrated an understanding of the instructions.   The patient was advised to call back or seek an in-person evaluation if the symptoms worsen or if the condition fails to improve as anticipated.   [facility] MD OP Progress Note  07/25/2020 9:35 PM Mary Koch  MRN:  [id number]  Chief Complaint:  Chief Complaint    Follow-up     HPI: Mary Koch is a 49 year old Caucasian female, lives in [place], has a history of MDD, bereavement, panic disorder, agoraphobia, tobacco use disorder, insomnia, caffeine use disorder was evaluated by telemedicine today.  Patient today reports she is currently doing okay on the current medication regimen.  She denies any significant mood lability or anxiety symptoms.  She reports sleep is improved on the higher dosage of Seroquel.  Patient reports she is currently recovering from a tick bite and fever due to the same.  She completed a course of antibiotic and continues to follow-up with her providers for the same.  Patient denies any suicidality, homicidality or perceptual disturbances.  Patient reports she recently heard the news that her brother has lung cancer, he is currently admitted to the hospital.  She reports she is worried about him however is currently coping okay.  She reports she missed her last appointment with her  therapist since she fell asleep since she was on antibiotics and other medications for the tick bite.  She agrees to call the clinic back to schedule a follow-up appointment with therapist.  She reports therapy sessions is beneficial.  Patient denies any other concerns today.  Visit Diagnosis:    ICD-10-CM   1. MDD (major depressive disorder), recurrent, in full remission ([name])  F33.42   2. Panic disorder  F41.0 DULoxetine (CYMBALTA) 60 MG capsule  3. Agoraphobia  F40.00   4. Insomnia due to other mental disorder  F51.05    F99   5. Tobacco use disorder  F17.200   6. 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  Past trials of medications like Paxil, Luvox, Klonopin, Zoloft, Seroquel, Wellbutrin.  Past Medical History:  Past Medical History:  Diagnosis Date   Allergic rhinitis    Anemia    Anxiety    Anxiety and depression    Carpal tunnel syndrome    COVID-19 01/15/2020   Depression    Fibroids    GERD (gastroesophageal reflux disease)    Headache(784.0)    Headache(784.0)    Heavy periods    Hyperthyroidism    right portion of thyroid removed   Muscle spasm    Neurological disorder    evaluation for ms   Painful menstrual periods    Shortness of breath    when i smoke a lot    Past Surgical History:  Procedure Laterality Date  BREAST BIOPSY Left    neg   carpel tunnel Left 2017   CESAREAN SECTION     times 2   CHOLECYSTECTOMY     THYROID LOBECTOMY      Family Psychiatric History: I have reviewed family psychiatric history from my progress note on 03/24/2018.  Family History:  Family History  Problem Relation Age of Onset   Osteoporosis Mother    Diabetes Father    Anxiety disorder Father    Depression Father    Post-traumatic stress disorder Father    Heart disease Father    Anxiety disorder Sister    Alcohol abuse Brother     Breast cancer Neg Hx    Colon cancer Neg Hx    Ovarian cancer Neg Hx     Social History: I have reviewed social history from my progress note on 03/24/2018. Social History   Socioeconomic History   Marital status: Legally Separated    Spouse name: Not on file   Number of children: Not on file   Years of education: Not on file   Highest education level: Not on file  Occupational History   Not on file  Tobacco Use   Smoking status: Current Every Day Smoker    Packs/day: 0.25    Years: 25.00    Pack years: 6.25    Types: Cigarettes    Start date: 05/22/1986   Smokeless tobacco: Never Used   Tobacco comment: 4 per day  Vaping Use   Vaping Use: Never used  Substance and Sexual Activity   Alcohol use: No    Alcohol/week: 0.0 standard drinks   Drug use: No   Sexual activity: Yes    Partners: Male    Birth control/protection: Surgical, Injection  Other Topics Concern   Not on file  Social History Narrative   Not on file   Social Determinants of Health   Financial Resource Strain:    Difficulty of Paying Living Expenses:   Food Insecurity:    Worried About Charity fundraiser in the Last Year:    Arboriculturist in the Last Year:   Transportation Needs:    Film/video editor (Medical):    Lack of Transportation (Non-Medical):   Physical Activity:    Days of Exercise per Week:    Minutes of Exercise per Session:   Stress:    Feeling of Stress :   Social Connections:    Frequency of Communication with Friends and Family:    Frequency of Social Gatherings with Friends and Family:    Attends Religious Services:    Active Member of Clubs or Organizations:    Attends Archivist Meetings:    Marital Status:     Allergies:  Allergies  Allergen Reactions   Azithromycin Other (See Comments)    headache   Codeine Other (See Comments)    unknown   Penicillins Other (See Comments)    headaches    Metabolic Disorder  Labs: No results found for: HGBA1C, MPG No results found for: PROLACTIN No results found for: CHOL, TRIG, HDL, CHOLHDL, VLDL, LDLCALC No results found for: TSH  Therapeutic Level Labs: No results found for: LITHIUM No results found for: VALPROATE No components found for:  CBMZ  Current Medications: Current Outpatient Medications  Medication Sig Dispense Refill   Ubrogepant 100 MG TABS Take by mouth.     albuterol (PROVENTIL HFA;VENTOLIN HFA) 108 (90 Base) MCG/ACT inhaler INHALE 1 TO 2 PUFFS DAILY  BREO ELLIPTA 100-25 MCG/INH AEPB Inhale 1 puff into the lungs daily. 28 each 4   buPROPion (WELLBUTRIN SR) 100 MG 12 hr tablet Take 1 tablet (100 mg total) by mouth 2 (two) times daily. 180 tablet 0   celecoxib (CELEBREX) 100 MG capsule      cyclobenzaprine (FLEXERIL) 10 MG tablet Take 10 mg by mouth 3 (three) times daily as needed for muscle spasms.     DULoxetine (CYMBALTA) 60 MG capsule Take 1 capsule (60 mg total) by mouth 2 (two) times daily. 180 capsule 0   fluticasone (FLONASE) 50 MCG/ACT nasal spray Place 1 spray into both nostrils daily.  4   furosemide (LASIX) 20 MG tablet TAKE 1 TABLET BY MOUTH TWICE A WEEK  5   gabapentin (NEURONTIN) 600 MG tablet Take 1 tablet (600 mg total) by mouth 2 (two) times daily. 180 tablet 1   medroxyPROGESTERone Acetate 150 MG/ML SUSY Inject 1 mL (150 mg total) into the muscle every 3 (three) months. 1 mL 3   meloxicam (MOBIC) 7.5 MG tablet Take 7.5 mg by mouth daily.  0   omeprazole (PRILOSEC) 40 MG capsule Take 40 mg by mouth daily.     propranolol (INDERAL) 10 MG tablet Take 1 tablet (10 mg total) by mouth 3 (three) times daily as needed. 270 tablet 1   QUEtiapine (SEROQUEL) 100 MG tablet Take 1.5 tablets (150 mg total) by mouth at bedtime. 135 tablet 0   rosuvastatin (CRESTOR) 20 MG tablet Take 20 mg by mouth daily.     topiramate (TOPAMAX) 100 MG tablet Take 1.5 tablets (150 mg total) by mouth daily. 150 MG AM AND 200 MG PM  ,BEING PRESCRIBED PER NEUROLOGY 135 tablet 1   No current facility-administered medications for this visit.     Musculoskeletal: Strength & Muscle Tone: UTA Gait & Station: normal Patient leans: N/A  Psychiatric Specialty Exam: Review of Systems  Constitutional: Positive for fatigue (due to recent viral syndrome - improving).  Psychiatric/Behavioral: Positive for dysphoric mood.  All other systems reviewed and are negative.   There were no vitals taken for this visit.There is no height or weight on file to calculate BMI.  General Appearance: Casual  Eye Contact:  Fair  Speech:  Clear and Coherent  Volume:  Normal  Mood:  Dysphoric improving  Affect:  Congruent  Thought Process:  Goal Directed and Descriptions of Associations: Intact  Orientation:  Full (Time, Place, and Person)  Thought Content: Logical   Suicidal Thoughts:  No  Homicidal Thoughts:  No  Memory:  Immediate;   Fair Recent;   Fair Remote;   Fair  Judgement:  Fair  Insight:  Fair  Psychomotor Activity:  Normal  Concentration:  Concentration: Fair and Attention Span: Fair  Recall:  [employer] of Knowledge: Fair  Language: Fair  Akathisia:  No  Handed:  Right  AIMS (if indicated): UTA  Assets:  Communication Skills Desire for Improvement Housing Social Support  ADL's:  Intact  Cognition: WNL  Sleep:  Fair   Screenings: AUDIT     Office Visit from 05/22/2016 in [facility]  Alcohol Use Disorder Identification Test Final Score (AUDIT) 0    GAD-7     Office Visit from 08/10/2019 in [facility]  Total GAD-7 Score 14    PHQ2-9     Office Visit from 08/10/2019 in [facility] Visit from 05/22/2016 in [facility]  PHQ-2 Total Score 3 4  PHQ-9  Total Score 15 18       Assessment and Plan: Michal Ramonda Galyon is a 49 year old Caucasian female with a history of panic attacks, MDD, tobacco use disorder, caffeine use disorder,  insomnia was evaluated by telemedicine today.  Patient is currently recovering from tick bite fever as well as has psychosocial stressors of her brother's new cancer diagnosis.  Patient however is currently making progress on current medication regimen and will continue to benefit from psychotherapy sessions.  Plan as noted below.  Plan MDD in remission Wellbutrin 100 mg p.o. twice daily Cymbalta 60 mg p.o. twice daily Neurontin 600 mg p.o. twice daily Seroquel 150 mg p.o. nightly  Panic attacks-stable Cymbalta as prescribed Gabapentin 600 mg p.o. twice daily Propranolol 10 mg p.o. 3 times daily as needed  Insomnia-restless Seroquel 150 mg p.o. nightly  Agoraphobia-stable Continue CBT  Tobacco use disorder-improving Provided counseling  Caffeine use disorder in early remission She will continue to work on cutting back  Pending labs-TSH, prolactin, hemoglobin A1c and lipid panel  Follow-up in clinic in 3 to 4 weeks or sooner if needed.  I have spent atleast 20 minutes non face to face with patient today. More than 50 % of the time was spent for preparing to see the patient ( e.g., review of test, records ), ordering medications and test ,psychoeducation and supportive psychotherapy and care coordination,as well as documenting clinical information in electronic health record. This note was generated in part or whole with voice recognition software. Voice recognition is usually quite accurate but there are transcription errors that can and very often do occur. I apologize for any typographical errors that were not detected and corrected.        Ursula Alert, MD 07/25/2020, 9:35 PM

## 2020-07-27 ENCOUNTER — Other Ambulatory Visit: Payer: Self-pay

## 2020-07-27 ENCOUNTER — Other Ambulatory Visit: Payer: Self-pay | Admitting: Certified Nurse Midwife

## 2020-07-27 ENCOUNTER — Encounter: Payer: Self-pay | Admitting: Licensed Clinical Social Worker

## 2020-07-27 ENCOUNTER — Ambulatory Visit (INDEPENDENT_AMBULATORY_CARE_PROVIDER_SITE_OTHER): Payer: Medicare HMO | Admitting: Licensed Clinical Social Worker

## 2020-07-27 DIAGNOSIS — N921 Excessive and frequent menstruation with irregular cycle: Secondary | ICD-10-CM

## 2020-07-27 DIAGNOSIS — F410 Panic disorder [episodic paroxysmal anxiety] without agoraphobia: Secondary | ICD-10-CM | POA: Diagnosis not present

## 2020-07-27 DIAGNOSIS — F4000 Agoraphobia, unspecified: Secondary | ICD-10-CM | POA: Diagnosis not present

## 2020-07-27 NOTE — Progress Notes (Signed)
Patient Location: Home  Provider Location: Home Office   Virtual Visit via Video Note  I connected with Mary Koch on 07/27/20 at  3:00 PM EDT by a video enabled telemedicine application and verified that I am speaking with the correct person using two identifiers.   I discussed the limitations of evaluation and management by telemedicine and the availability of in person appointments. The patient expressed understanding and agreed to proceed.  THERAPY PROGRESS NOTE  Session Time: 30 Minutes  Participation Level: Active  Behavioral Response: CasualAlertAnxious  Type of Therapy: Individual Therapy  Treatment Goals addressed: Anxiety and Coping  Interventions: CBT and Motivational Interviewing  Summary: Mary Koch is a 49 y.o. female who presents with panic disorder with agoraphobia. Pt reported that she has been recovering from an illness since last session and that is why she wasn't able to make last appointment. Pt reported the medication she is taking caused her to fall asleep. Pt reported she is feeling a little better now and has found some opportunities to get out of the house, such as helping her son get to the [facility] to take his driving test. Pt identified a dream as a younger child to one day become a Animal nutritionist and live with enough land to have lots of animals. Pt reported after meeting the father of her children that her life went down a different road and believes "I am too old" and "it would be a waste" to pursue this vision now or in the future. .   Suicidal/Homicidal: No  Therapist Response: Therapist met with patient for follow up session. Therapist and patient revisited discussion from last session about stages of change and went more in-depth with tasks of the preparation stage. Therapist encouraged patient to identify where she would like to see herself in 5 or 10 years. Pt struggled to answer. Therapist then explored what patient envisioned when she was younger.  Pt was receptive, but doubtful about her ability to make this vision happen now. Therapist assigned patient homework to think about where she would like to see herself 5-10 years from now without focusing on the steps she would need to take to get there.  Plan: Return again in 2 weeks.  Diagnosis: Axis I: Panic Disorder w/ Agoraphobia    Axis II: N/A  Josephine Igo, LCSW, LCAS 07/27/2020

## 2020-07-28 NOTE — Assessment & Plan Note (Signed)
-   I instructed the patient to stop smoking and provided them with smoking cessation materials.  - I informed the patient that smoking puts them at increased risk for cancer, COPD, hypertension, and more.  - Informed the patient to seek help if they begin to have trouble breathing, develop chest pain, start to cough up blood, feel faint, or pass out.  

## 2020-08-03 ENCOUNTER — Other Ambulatory Visit: Payer: Self-pay | Admitting: Psychiatry

## 2020-08-03 DIAGNOSIS — F410 Panic disorder [episodic paroxysmal anxiety] without agoraphobia: Secondary | ICD-10-CM

## 2020-08-10 ENCOUNTER — Other Ambulatory Visit: Payer: Self-pay | Admitting: Internal Medicine

## 2020-08-10 ENCOUNTER — Encounter: Payer: Self-pay | Admitting: Certified Nurse Midwife

## 2020-08-10 ENCOUNTER — Other Ambulatory Visit: Payer: Self-pay

## 2020-08-10 ENCOUNTER — Ambulatory Visit (INDEPENDENT_AMBULATORY_CARE_PROVIDER_SITE_OTHER): Payer: Medicare HMO | Admitting: Certified Nurse Midwife

## 2020-08-10 VITALS — BP 86/60 | HR 102 | Ht 66.0 in | Wt 267.0 lb

## 2020-08-10 DIAGNOSIS — Z01419 Encounter for gynecological examination (general) (routine) without abnormal findings: Secondary | ICD-10-CM

## 2020-08-10 DIAGNOSIS — N921 Excessive and frequent menstruation with irregular cycle: Secondary | ICD-10-CM | POA: Diagnosis not present

## 2020-08-10 DIAGNOSIS — Z1231 Encounter for screening mammogram for malignant neoplasm of breast: Secondary | ICD-10-CM | POA: Diagnosis not present

## 2020-08-10 DIAGNOSIS — Z6841 Body Mass Index (BMI) 40.0 and over, adult: Secondary | ICD-10-CM | POA: Diagnosis not present

## 2020-08-10 DIAGNOSIS — Z3042 Encounter for surveillance of injectable contraceptive: Secondary | ICD-10-CM | POA: Diagnosis not present

## 2020-08-10 MED ORDER — MELOXICAM 7.5 MG PO TABS: 7.5000 mg | ORAL_TABLET | Freq: Every day | ORAL | 3 refills | Status: DC

## 2020-08-10 MED ORDER — OMEPRAZOLE 40 MG PO CPDR: 40.0000 mg | DELAYED_RELEASE_CAPSULE | Freq: Every day | ORAL | 3 refills | Status: DC

## 2020-08-10 MED ORDER — ALBUTEROL SULFATE HFA 108 (90 BASE) MCG/ACT IN AERS: INHALATION_SPRAY | RESPIRATORY_TRACT | 5 refills | Status: DC

## 2020-08-10 MED ORDER — MEDROXYPROGESTERONE ACETATE 150 MG/ML IM SUSP
150.0000 mg | Freq: Once | INTRAMUSCULAR | Status: AC
  Administered 2020-08-10: 150 mg via INTRAMUSCULAR

## 2020-08-10 NOTE — Progress Notes (Signed)
ANNUAL PREVENTATIVE CARE GYN  ENCOUNTER NOTE  Subjective:       Mary Koch is a 49 y.o. [phone] female here for a routine annual gynecologic exam.  No questions or concerns at this time.   Denies difficulty breathing or respiratory distress, chest pain, excessive vaginal bleeding, dysuria, and leg pain or swelling.    Gynecologic History  No LMP recorded. Patient has had an injection.  Contraception: Depo-Provera injections and tubal ligation  Last Pap: 10/2017. Results were: Neg/Neg  Last mammogram: 10/2016. Results were: BI-RADS 1  Obstetric History  OB History  Gravida Para Term Preterm AB Living  2 2 2     2   SAB TAB Ectopic Multiple Live Births          2    # Outcome Date GA Lbr Len/2nd Weight Sex Delivery Anes PTL Lv  2 Term 04/18/09   7 lb 1.8 oz (3.225 kg) M CS-LTranv Spinal  LIV  1 Term 06/08/02   7 lb 6.4 oz (3.357 kg) M CS-LTranv EPI  LIV     Complications: Failure to Progress in Second Stage    Past Medical History:  Diagnosis Date  . Allergic rhinitis   . Anemia   . Anxiety   . Anxiety and depression   . Carpal tunnel syndrome   . COVID-19 01/15/2020  . Depression   . Fibroids   . GERD (gastroesophageal reflux disease)   . Headache(784.0)   . Headache(784.0)   . Heavy periods   . Hyperthyroidism    right portion of thyroid removed  . Muscle spasm   . Neurological disorder    evaluation for ms  . Painful menstrual periods   . Shortness of breath    when i smoke a lot    Past Surgical History:  Procedure Laterality Date  . BREAST BIOPSY Left    neg  . carpel tunnel Left 2017  . CESAREAN SECTION     times 2  . CHOLECYSTECTOMY    . THYROID LOBECTOMY      Current Outpatient Medications on File Prior to Visit  Medication Sig Dispense Refill  . BREO ELLIPTA 100-25 MCG/INH AEPB Inhale 1 puff into the lungs daily. 28 each 4  . buPROPion (WELLBUTRIN SR) 100 MG 12 hr tablet Take 1 tablet (100 mg total) by mouth 2 (two) times daily. 180  tablet 0  . celecoxib (CELEBREX) 100 MG capsule     . cyclobenzaprine (FLEXERIL) 10 MG tablet Take 10 mg by mouth 3 (three) times daily as needed for muscle spasms.    . DULoxetine (CYMBALTA) 60 MG capsule Take 1 capsule (60 mg total) by mouth 2 (two) times daily. 180 capsule 0  . furosemide (LASIX) 20 MG tablet TAKE 1 TABLET BY MOUTH TWICE A WEEK  5  . gabapentin (NEURONTIN) 600 MG tablet Take 1 tablet (600 mg total) by mouth 2 (two) times daily. 180 tablet 1  . medroxyPROGESTERone Acetate 150 MG/ML SUSY INJECT 1 ML (150 MG TOTAL) INTO THE MUSCLE EVERY 3 (THREE) MONTHS. 1 mL 3  . propranolol (INDERAL) 10 MG tablet TAKE 1 TABLET BY MOUTH THREE TIMES A DAY AS NEEDED 270 tablet 1  . QUEtiapine (SEROQUEL) 100 MG tablet Take 1.5 tablets (150 mg total) by mouth at bedtime. 135 tablet 0  . rosuvastatin (CRESTOR) 20 MG tablet Take 20 mg by mouth daily.    Marland Kitchen topiramate (TOPAMAX) 100 MG tablet Take 1.5 tablets (150 mg total) by mouth daily. 150 MG  AM AND 200 MG PM ,BEING PRESCRIBED PER NEUROLOGY 135 tablet 1  . Ubrogepant 100 MG TABS Take by mouth.    . fluticasone (FLONASE) 50 MCG/ACT nasal spray Place 1 spray into both nostrils daily.  4   No current facility-administered medications on file prior to visit.    Allergies  Allergen Reactions  . Azithromycin Other (See Comments)    headache  . Codeine Other (See Comments)    unknown  . Penicillins Other (See Comments)    headaches    Social History   Socioeconomic History  . Marital status: Legally Separated    Spouse name: Not on file  . Number of children: Not on file  . Years of education: Not on file  . Highest education level: Not on file  Occupational History  . Not on file  Tobacco Use  . Smoking status: Current Every Day Smoker    Packs/day: 0.25    Years: 25.00    Pack years: 6.25    Types: Cigarettes    Start date: 05/22/1986  . Smokeless tobacco: Never Used  . Tobacco comment: 4 per day  Vaping Use  . Vaping Use: Never  used  Substance and Sexual Activity  . Alcohol use: No    Alcohol/week: 0.0 standard drinks  . Drug use: No  . Sexual activity: Yes    Partners: Male    Birth control/protection: Surgical, Injection  Other Topics Concern  . Not on file  Social History Narrative  . Not on file   Social Determinants of Health   Financial Resource Strain:   . Difficulty of Paying Living Expenses:   Food Insecurity:   . Worried About Charity fundraiser in the Last Year:   . Arboriculturist in the Last Year:   Transportation Needs:   . Film/video editor (Medical):   Marland Kitchen Lack of Transportation (Non-Medical):   Physical Activity:   . Days of Exercise per Week:   . Minutes of Exercise per Session:   Stress:   . Feeling of Stress :   Social Connections:   . Frequency of Communication with Friends and Family:   . Frequency of Social Gatherings with Friends and Family:   . Attends Religious Services:   . Active Member of Clubs or Organizations:   . Attends Archivist Meetings:   Marland Kitchen Marital Status:   Intimate Partner Violence:   . Fear of Current or Ex-Partner:   . Emotionally Abused:   Marland Kitchen Physically Abused:   . Sexually Abused:     Family History  Problem Relation Age of Onset  . Osteoporosis Mother   . Diabetes Father   . Anxiety disorder Father   . Depression Father   . Post-traumatic stress disorder Father   . Heart disease Father   . Anxiety disorder Sister   . Alcohol abuse Brother   . Breast cancer Neg Hx   . Colon cancer Neg Hx   . Ovarian cancer Neg Hx     The following portions of the patient's history were reviewed and updated as appropriate: allergies, current medications, past family history, past medical history, past social history, past surgical history and problem list.  Review of Systems  ROS negative except as noted above. Information obtained from patient.    Objective:   BP (!) 86/60   Pulse (!) 102   Ht 5\' 6"  (1.676 m)   Wt 267 lb (121.1 kg)    BMI 43.09 kg/m  CONSTITUTIONAL: Well-developed, well-nourished female in no acute distress.   PSYCHIATRIC: Normal mood and affect. Normal behavior. Normal judgment and thought content.  [place]: Alert and oriented to person, place, and time. Normal muscle tone coordination. No cranial nerve deficit noted.  HENT:  Normocephalic, atraumatic, External right and left ear normal. Oropharynx is clear and moist  EYES: Conjunctivae and EOM are normal. Pupils are equal and round.   NECK: Normal range of motion, supple, no masses.  Normal thyroid.   SKIN: Skin is warm and dry. No rash noted. Not diaphoretic. No erythema. No pallor. Professional tattoos present.   CARDIOVASCULAR: Normal heart rate noted, regular rhythm, no murmur.  RESPIRATORY: Clear to auscultation bilaterally. Effort and breath sounds normal, no problems with respiration noted.  BREASTS: Symmetric in size. No masses, skin changes, nipple drainage, or lymphadenopathy.  ABDOMEN: Soft, normal bowel sounds, no distention noted.  No tenderness, rebound or guarding.   PELVIC:  External Genitalia: Normal  Vagina: Normal  Cervix: Normal  Uterus: Normal  Adnexa: Normal  RV: External hemorrhoid present    MUSCULOSKELETAL: Normal range of motion. No tenderness.  No cyanosis, clubbing, or edema.  2+ distal pulses.  LYMPHATIC: No Axillary, Supraclavicular, or Inguinal Adenopathy.  Assessment:   Annual gynecologic examination 49 y.o.   Contraception: Depo-Provera injections   Obesity 3   Problem List Items Addressed This Visit      Other   Excess, menstruation    Other Visit Diagnoses    Well woman exam    -  Primary   Surveillance for Depo-Provera contraception       BMI 40.0-44.9, adult ([place])          Plan:   Pap: Not needed  Mammogram: Ordered   DEXA Scan: Ordered  Labs: Declined  Depo Provera injection given today, see chart  Routine preventative health maintenance measures emphasized:  Exercise/Diet/Weight control, Tobacco Warnings, Alcohol/Substance use risks and Stress Management; see AVS  Reviewed red flag symptoms and when to call  Return to [facility] for [employer] or sooner if needed   Dani Gobble, CNM  [facility], [facility] 08/10/20 1:47 PM

## 2020-08-10 NOTE — Patient Instructions (Signed)
Preventive Care 40-47 Years Old, Female Preventive care refers to visits with your health care provider and lifestyle choices that can promote health and wellness. This includes:  A yearly physical exam. This may also be called an annual well check.  Regular dental visits and eye exams.  Immunizations.  Screening for certain conditions.  Healthy lifestyle choices, such as eating a healthy diet, getting regular exercise, not using drugs or products that contain nicotine and tobacco, and limiting alcohol use. What can I expect for my preventive care visit? Physical exam Your health care provider will check your:  Height and weight. This may be used to calculate body mass index (BMI), which tells if you are at a healthy weight.  Heart rate and blood pressure.  Skin for abnormal spots. Counseling Your health care provider may ask you questions about your:  Alcohol, tobacco, and drug use.  Emotional well-being.  Home and relationship well-being.  Sexual activity.  Eating habits.  Work and work environment.  Method of birth control.  Menstrual cycle.  Pregnancy history. What immunizations do I need?  Influenza (flu) vaccine  This is recommended every year. Tetanus, diphtheria, and pertussis (Tdap) vaccine  You may need a Td booster every 10 years. Varicella (chickenpox) vaccine  You may need this if you have not been vaccinated. Zoster (shingles) vaccine  You may need this after age 60. Measles, mumps, and rubella (MMR) vaccine  You may need at least one dose of MMR if you were born in 1957 or later. You may also need a second dose. Pneumococcal conjugate (PCV13) vaccine  You may need this if you have certain conditions and were not previously vaccinated. Pneumococcal polysaccharide (PPSV23) vaccine  You may need one or two doses if you smoke cigarettes or if you have certain conditions. Meningococcal conjugate (MenACWY) vaccine  You may need this if you  have certain conditions. Hepatitis A vaccine  You may need this if you have certain conditions or if you travel or work in places where you may be exposed to hepatitis A. Hepatitis B vaccine  You may need this if you have certain conditions or if you travel or work in places where you may be exposed to hepatitis B. Haemophilus influenzae type b (Hib) vaccine  You may need this if you have certain conditions. Human papillomavirus (HPV) vaccine  If recommended by your health care provider, you may need three doses over 6 months. You may receive vaccines as individual doses or as more than one vaccine together in one shot (combination vaccines). Talk with your health care provider about the risks and benefits of combination vaccines. What tests do I need? Blood tests  Lipid and cholesterol levels. These may be checked every 5 years, or more frequently if you are over 50 years old.  Hepatitis C test.  Hepatitis B test. Screening  Lung cancer screening. You may have this screening every year starting at age 55 if you have a 30-pack-year history of smoking and currently smoke or have quit within the past 15 years.  Colorectal cancer screening. All adults should have this screening starting at age 50 and continuing until age 75. Your health care provider may recommend screening at age 45 if you are at increased risk. You will have tests every 1-10 years, depending on your results and the type of screening test.  Diabetes screening. This is done by checking your blood sugar (glucose) after you have not eaten for a while (fasting). You may have this   done every 1-3 years.  Mammogram. This may be done every 1-2 years. Talk with your health care provider about when you should start having regular mammograms. This may depend on whether you have a family history of breast cancer.  BRCA-related cancer screening. This may be done if you have a family history of breast, ovarian, tubal, or peritoneal  cancers.  Pelvic exam and Pap test. This may be done every 3 years starting at age 2. Starting at age 61, this may be done every 5 years if you have a Pap test in combination with an HPV test. Other tests  Sexually transmitted disease (STD) testing.  Bone density scan. This is done to screen for osteoporosis. You may have this scan if you are at high risk for osteoporosis. Follow these instructions at home: Eating and drinking  Eat a diet that includes fresh fruits and vegetables, whole grains, lean protein, and low-fat dairy.  Take vitamin and mineral supplements as recommended by your health care provider.  Do not drink alcohol if: ? Your health care provider tells you not to drink. ? You are pregnant, may be pregnant, or are planning to become pregnant.  If you drink alcohol: ? Limit how much you have to 0-1 drink a day. ? Be aware of how much alcohol is in your drink. In the [place] one drink equals one 12 oz bottle of beer (355 mL), one 5 oz glass of wine (148 mL), or one 1 oz glass of hard liquor (44 mL). Lifestyle  Take daily care of your teeth and gums.  Stay active. Exercise for at least 30 minutes on 5 or more days each week.  Do not use any products that contain nicotine or tobacco, such as cigarettes, e-cigarettes, and chewing tobacco. If you need help quitting, ask your health care provider.  If you are sexually active, practice safe sex. Use a condom or other form of birth control (contraception) in order to prevent pregnancy and STIs (sexually transmitted infections).  If told by your health care provider, take low-dose aspirin daily starting at age 51. What's next?  Visit your health care provider once a year for a well check visit.  Ask your health care provider how often you should have your eyes and teeth checked.  Stay up to date on all vaccines. This information is not intended to replace advice given to you by your health care provider. Make sure you  discuss any questions you have with your health care provider. Document Revised: 08/27/2018 Document Reviewed: 08/27/2018 Elsevier Patient Education  2020 [place].   Bone Density Test The bone density test uses a special type of X-ray to measure the amount of calcium and other minerals in your bones. It can measure bone density in the hip and the spine. The test procedure is similar to having a regular X-ray. This test may also be called:  Bone densitometry.  Bone mineral density test.  Dual-energy X-ray absorptiometry (DEXA). You may have this test to:  Diagnose a condition that causes weak or thin bones (osteoporosis).  Screen you for osteoporosis.  Predict your risk for a broken bone (fracture).  Determine how well your osteoporosis treatment is working. Tell a health care provider about:  Any allergies you have.  All medicines you are taking, including vitamins, herbs, eye drops, creams, and over-the-counter medicines.  Any problems you or family members have had with anesthetic medicines.  Any blood disorders you have.  Any surgeries you have had.  Any medical conditions you have.  Whether you are pregnant or may be pregnant.  Any medical tests you have had within the past 14 days that used contrast material. What are the risks? Generally, this is a safe procedure. However, it does expose you to a small amount of radiation, which can slightly increase your cancer risk. What happens before the procedure?  Do not take any calcium supplements starting 24 hours before your test.  Remove all metal jewelry, eyeglasses, dental appliances, and any other metal objects. What happens during the procedure?   You will lie down on an exam table. There will be an X-ray generator below you and an imaging device above you.  Other devices, such as boxes or braces, may be used to position your body properly for the scan.  The machine will slowly scan your body. You will  need to keep still.  The images will show up on a screen in the room. Images will be examined by a specialist after your test is done. The procedure may vary among health care providers and hospitals. What happens after the procedure?  It is up to you to get your test results. Ask your health care provider, or the department that is doing the test, when your results will be ready. Summary  A bone density test is an imaging test that uses a type of X-ray to measure the amount of calcium and other minerals in your bones.  The test may be used to diagnose or screen you for a condition that causes weak or thin bones (osteoporosis), predict your risk for a broken bone (fracture), or determine how well your osteoporosis treatment is working.  Do not take any calcium supplements starting 24 hours before your test.  Ask your health care provider, or the department that is doing the test, when your results will be ready. This information is not intended to replace advice given to you by your health care provider. Make sure you discuss any questions you have with your health care provider. Document Revised: 01/01/2018 Document Reviewed: 10/20/2017 Elsevier Patient Education  [place].

## 2020-08-11 ENCOUNTER — Other Ambulatory Visit: Payer: Self-pay

## 2020-08-11 ENCOUNTER — Encounter: Payer: Self-pay | Admitting: Licensed Clinical Social Worker

## 2020-08-11 ENCOUNTER — Ambulatory Visit (INDEPENDENT_AMBULATORY_CARE_PROVIDER_SITE_OTHER): Payer: Medicare HMO | Admitting: Licensed Clinical Social Worker

## 2020-08-11 DIAGNOSIS — F410 Panic disorder [episodic paroxysmal anxiety] without agoraphobia: Secondary | ICD-10-CM

## 2020-08-11 DIAGNOSIS — F4000 Agoraphobia, unspecified: Secondary | ICD-10-CM | POA: Diagnosis not present

## 2020-08-11 NOTE — Progress Notes (Signed)
Patient Location: Home  Provider Location: Home Office   Virtual Visit via Video Note  I connected with [name] on 08/11/20 at 10:00 AM EDT by a video enabled telemedicine application and verified that I am speaking with the correct person using two identifiers.   I discussed the limitations of evaluation and management by telemedicine and the availability of in person appointments. The patient expressed understanding and agreed to proceed.  THERAPY PROGRESS NOTE  Session Time: 20 Minutes  Participation Level: Active  Behavioral Response: CasualAlertEuthymic  Type of Therapy: Individual Therapy  Treatment Goals addressed: Anxiety and Coping  Interventions: CBT  Summary: Mary Koch is a 49 y.o. female who presents with anxiety sxs. Pt reported that her family has been going through rough times due to testing for lung cancer and recent death. Pt reported she is doing her best to provide emotional support. Pt reported she has not followed up on homework assignment to think about where she would like to see herself in 5-10 years. Pt explained "I live best day by day and don't think about stuff like that". Pt reported when she hears about people dying unexpectedly and that it can happen to anyone, she doesn't really see the point in the exercise. Pt described what she would like to see happen at the end of a day and discussed regrets. Pt reported she did go on an outing with her son "even though I didn't want to and thought I was going to have a heart attack".  Suicidal/Homicidal: No  Therapist Response: Therapist met with patient for follow up session. Therapist and patient reviewed homework assignment. Therapist encouraged patient to identify what provides her a sense of accomplishment at the end of a day and what regrets if any she has. Pt was receptive. Therapist praised patient's ability to go on an outing with her son and asked what she learned from the experience. Therapist  encouraged patient to continue to take opportunities to leave the home with her children and at least observe other people's interactions without having to participate to gradually expose patient to social situations.  Plan: Return again in 2 weeks.  Diagnosis: Axis I: Panic Disorder and Agoraphobia    Axis II: N/A  Josephine Igo, LCSW, LCAS 08/11/2020

## 2020-08-25 ENCOUNTER — Other Ambulatory Visit: Payer: Self-pay

## 2020-08-25 ENCOUNTER — Ambulatory Visit (INDEPENDENT_AMBULATORY_CARE_PROVIDER_SITE_OTHER): Payer: Medicare HMO | Admitting: Licensed Clinical Social Worker

## 2020-08-25 ENCOUNTER — Encounter: Payer: Self-pay | Admitting: Licensed Clinical Social Worker

## 2020-08-25 DIAGNOSIS — F410 Panic disorder [episodic paroxysmal anxiety] without agoraphobia: Secondary | ICD-10-CM | POA: Diagnosis not present

## 2020-08-25 DIAGNOSIS — Z634 Disappearance and death of family member: Secondary | ICD-10-CM

## 2020-08-25 DIAGNOSIS — F4000 Agoraphobia, unspecified: Secondary | ICD-10-CM

## 2020-08-25 NOTE — Progress Notes (Signed)
Patient Location: Home  Provider Location: Home Office   Virtual Visit via Video Note  I connected with Mary Koch on 08/25/20 at 10:00 AM EDT by a video enabled telemedicine application and verified that I am speaking with the correct person using two identifiers.   I discussed the limitations of evaluation and management by telemedicine and the availability of in person appointments. The patient expressed understanding and agreed to proceed.  THERAPY PROGRESS NOTE  Session Time: 30 Minutes  Participation Level: Active  Behavioral Response: CasualAlertDepressed  Type of Therapy: Individual Therapy  Treatment Goals addressed: Coping  Interventions: CBT  Summary: Mary Koch is a 49 y.o. female who presents with anxiety sxs. Pt reported that her brother's cancer results came back worse than they thought and is experiencing some grief and loss issues with recent deaths of friends and family. Pt reported she is not comfortable being around her brother now that she knows how serious his illness is and it reminds her of watching her father go through cancer treatment about 2 years ago. Pt acknowledged and discussed her own fears of death.  Suicidal/Homicidal: No  Therapist Response: Therapist met with patient for follow up session. Therapist and patient discussed recent losses and fears around death. Therapist and patient discussed ways to cope and how to be of support to others going through illness. Pt was receptive  Plan: Return again in 2 weeks.  Diagnosis: Axis I: Panic Disorder and Agoraphobia    Axis II: N/A    Josephine Igo, LCSW, LCAS 08/25/2020

## 2020-08-28 ENCOUNTER — Other Ambulatory Visit: Payer: Self-pay

## 2020-08-28 ENCOUNTER — Telehealth (INDEPENDENT_AMBULATORY_CARE_PROVIDER_SITE_OTHER): Payer: Medicare HMO | Admitting: Psychiatry

## 2020-08-28 ENCOUNTER — Encounter: Payer: Self-pay | Admitting: Psychiatry

## 2020-08-28 DIAGNOSIS — F410 Panic disorder [episodic paroxysmal anxiety] without agoraphobia: Secondary | ICD-10-CM

## 2020-08-28 DIAGNOSIS — G4733 Obstructive sleep apnea (adult) (pediatric): Secondary | ICD-10-CM | POA: Diagnosis not present

## 2020-08-28 DIAGNOSIS — R7309 Other abnormal glucose: Secondary | ICD-10-CM | POA: Diagnosis not present

## 2020-08-28 DIAGNOSIS — F17200 Nicotine dependence, unspecified, uncomplicated: Secondary | ICD-10-CM

## 2020-08-28 DIAGNOSIS — F3342 Major depressive disorder, recurrent, in full remission: Secondary | ICD-10-CM | POA: Diagnosis not present

## 2020-08-28 DIAGNOSIS — F4000 Agoraphobia, unspecified: Secondary | ICD-10-CM | POA: Diagnosis not present

## 2020-08-28 DIAGNOSIS — G5603 Carpal tunnel syndrome, bilateral upper limbs: Secondary | ICD-10-CM | POA: Diagnosis not present

## 2020-08-28 DIAGNOSIS — F1590 Other stimulant use, unspecified, uncomplicated: Secondary | ICD-10-CM

## 2020-08-28 DIAGNOSIS — E531 Pyridoxine deficiency: Secondary | ICD-10-CM | POA: Diagnosis not present

## 2020-08-28 DIAGNOSIS — F5105 Insomnia due to other mental disorder: Secondary | ICD-10-CM

## 2020-08-28 DIAGNOSIS — F99 Mental disorder, not otherwise specified: Secondary | ICD-10-CM | POA: Diagnosis not present

## 2020-08-28 DIAGNOSIS — G43119 Migraine with aura, intractable, without status migrainosus: Secondary | ICD-10-CM | POA: Diagnosis not present

## 2020-08-28 DIAGNOSIS — E559 Vitamin D deficiency, unspecified: Secondary | ICD-10-CM | POA: Diagnosis not present

## 2020-08-28 DIAGNOSIS — E538 Deficiency of other specified B group vitamins: Secondary | ICD-10-CM | POA: Diagnosis not present

## 2020-08-28 MED ORDER — BUPROPION HCL ER (SR) 100 MG PO TB12: 100.0000 mg | ORAL_TABLET | Freq: Two times a day (BID) | ORAL | 0 refills | Status: DC

## 2020-08-28 MED ORDER — QUETIAPINE FUMARATE 100 MG PO TABS: 150.0000 mg | ORAL_TABLET | Freq: Every day | ORAL | 0 refills | Status: DC

## 2020-08-28 MED ORDER — GABAPENTIN 600 MG PO TABS: 600.0000 mg | ORAL_TABLET | Freq: Two times a day (BID) | ORAL | 1 refills | Status: DC

## 2020-08-28 NOTE — Progress Notes (Signed)
Provider Location : [facility] Patient Location : Home  Participants: Patient , Provider  Virtual Visit via Video Note  I connected with Mary Koch on 08/28/20 at  2:40 PM EDT by a video enabled telemedicine application and verified that I am speaking with the correct person using two identifiers.   I discussed the limitations of evaluation and management by telemedicine and the availability of in person appointments. The patient expressed understanding and agreed to proceed.     I discussed the assessment and treatment plan with the patient. The patient was provided an opportunity to ask questions and all were answered. The patient agreed with the plan and demonstrated an understanding of the instructions.   The patient was advised to call back or seek an in-person evaluation if the symptoms worsen or if the condition fails to improve as anticipated.   [facility] MD OP Progress Note  08/28/2020 8:25 AM Mary Koch  MRN:  [id number]  Chief Complaint:  Chief Complaint    Follow-up     HPI: Mary Koch is a 49 year old Caucasian female, lives in [place], has a history of MDD, bereavement, panic disorder, agoraphobia, tobacco use disorder, insomnia, caffeine use disorder was evaluated by telemedicine today.  Patient today reports she is currently struggling with excessive sleepiness during the day.  She reports she has had sleep study done recently.  She however reports she continues to await the sleep study results.  She also reports she had multiple blood work-up done today.  Patient reports overall she is doing okay with regards to her mood.  Denies any significant depression or anxiety symptoms.    She denies any suicidality, homicidality or perceptual disturbances.  She is interested in continuing psychotherapy sessions.  She reports good benefit from the same.  She is not interested in reducing the dosage of her medications for excessive sleepiness yet.  She would like to wait for  her sleep study report.  Patient denies any other concerns today.    Visit Diagnosis:    ICD-10-CM   1. MDD (major depressive disorder), recurrent, in full remission ([place])  F33.42 buPROPion (WELLBUTRIN SR) 100 MG 12 hr tablet    QUEtiapine (SEROQUEL) 100 MG tablet  2. Panic disorder  F41.0   3. Agoraphobia  F40.00 gabapentin (NEURONTIN) 600 MG tablet  4. Insomnia due to other mental disorder  F51.05 QUEtiapine (SEROQUEL) 100 MG tablet   F99   5. Tobacco use disorder  F17.200   6. 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  Past trials of medications like Paxil, Luvox, Klonopin, Zoloft, Seroquel, Wellbutrin.  Past Medical History:  Past Medical History:  Diagnosis Date  . Allergic rhinitis   . Anemia   . Anxiety   . Anxiety and depression   . Carpal tunnel syndrome   . COVID-19 01/15/2020  . Depression   . Fibroids   . GERD (gastroesophageal reflux disease)   . Headache(784.0)   . Headache(784.0)   . Heavy periods   . Hyperthyroidism    right portion of thyroid removed  . Muscle spasm   . Neurological disorder    evaluation for ms  . Painful menstrual periods   . Shortness of breath    when i smoke a lot    Past Surgical History:  Procedure Laterality Date  . BREAST BIOPSY Left    neg  . carpel tunnel Left 2017  .  CESAREAN SECTION     times 2  . CHOLECYSTECTOMY    . THYROID LOBECTOMY      Family Psychiatric History: I have reviewed family psychiatric history from my progress note from 03/24/2018  Family History:  Family History  Problem Relation Age of Onset  . Osteoporosis Mother   . Diabetes Father   . Anxiety disorder Father   . Depression Father   . Post-traumatic stress disorder Father   . Heart disease Father   . Anxiety disorder Sister   . Alcohol abuse Brother   . Breast cancer Neg Hx   . Colon cancer Neg Hx   . Ovarian cancer Neg Hx     Social  History: Reviewed social history from my progress note on 03/24/2018 Social History   Socioeconomic History  . Marital status: Legally Separated    Spouse name: Not on file  . Number of children: Not on file  . Years of education: Not on file  . Highest education level: Not on file  Occupational History  . Not on file  Tobacco Use  . Smoking status: Current Every Day Smoker    Packs/day: 0.50    Years: 25.00    Pack years: 12.50    Types: Cigarettes    Start date: 05/22/1986  . Smokeless tobacco: Never Used  Vaping Use  . Vaping Use: Never used  Substance and Sexual Activity  . Alcohol use: No    Alcohol/week: 0.0 standard drinks  . Drug use: No  . Sexual activity: Yes    Partners: Male    Birth control/protection: Surgical, Injection  Other Topics Concern  . Not on file  Social History Narrative  . Not on file   Social Determinants of Health   Financial Resource Strain:   . Difficulty of Paying Living Expenses: Not on file  Food Insecurity:   . Worried About Charity fundraiser in the Last Year: Not on file  . Ran Out of Food in the Last Year: Not on file  Transportation Needs:   . Lack of Transportation (Medical): Not on file  . Lack of Transportation (Non-Medical): Not on file  Physical Activity:   . Days of Exercise per Week: Not on file  . Minutes of Exercise per Session: Not on file  Stress:   . Feeling of Stress : Not on file  Social Connections:   . Frequency of Communication with Friends and Family: Not on file  . Frequency of Social Gatherings with Friends and Family: Not on file  . Attends Religious Services: Not on file  . Active Member of Clubs or Organizations: Not on file  . Attends Archivist Meetings: Not on file  . Marital Status: Not on file    Allergies:  Allergies  Allergen Reactions  . Azithromycin Other (See Comments)    headache  . Codeine Other (See Comments)    unknown  . Penicillins Other (See Comments)    headaches     Metabolic Disorder Labs: No results found for: HGBA1C, MPG No results found for: PROLACTIN No results found for: CHOL, TRIG, HDL, CHOLHDL, VLDL, LDLCALC No results found for: TSH  Therapeutic Level Labs: No results found for: LITHIUM No results found for: VALPROATE No components found for:  CBMZ  Current Medications: Current Outpatient Medications  Medication Sig Dispense Refill  . albuterol (VENTOLIN HFA) 108 (90 Base) MCG/ACT inhaler INHALE 1 TO 2 PUFFS DAILY 8 g 5  . BREO ELLIPTA 100-25 MCG/INH AEPB Inhale  1 puff into the lungs daily. 28 each 4  . buPROPion (WELLBUTRIN SR) 100 MG 12 hr tablet Take 1 tablet (100 mg total) by mouth 2 (two) times daily. 180 tablet 0  . celecoxib (CELEBREX) 100 MG capsule     . cyclobenzaprine (FLEXERIL) 10 MG tablet Take 10 mg by mouth 3 (three) times daily as needed for muscle spasms.    . DULoxetine (CYMBALTA) 60 MG capsule Take 1 capsule (60 mg total) by mouth 2 (two) times daily. 180 capsule 0  . fluticasone (FLONASE) 50 MCG/ACT nasal spray Place 1 spray into both nostrils daily.  4  . furosemide (LASIX) 20 MG tablet TAKE 1 TABLET BY MOUTH TWICE A WEEK  5  . gabapentin (NEURONTIN) 600 MG tablet Take 1 tablet (600 mg total) by mouth 2 (two) times daily. 180 tablet 1  . medroxyPROGESTERone (DEPO-PROVERA) 150 MG/ML injection     . medroxyPROGESTERone Acetate 150 MG/ML SUSY INJECT 1 ML (150 MG TOTAL) INTO THE MUSCLE EVERY 3 (THREE) MONTHS. 1 mL 3  . meloxicam (MOBIC) 7.5 MG tablet Take 1 tablet (7.5 mg total) by mouth daily. 30 tablet 3  . omeprazole (PRILOSEC) 40 MG capsule Take 1 capsule (40 mg total) by mouth daily. 90 capsule 3  . propranolol (INDERAL) 10 MG tablet TAKE 1 TABLET BY MOUTH THREE TIMES A DAY AS NEEDED 270 tablet 1  . QUEtiapine (SEROQUEL) 100 MG tablet Take 1.5 tablets (150 mg total) by mouth at bedtime. 135 tablet 0  . rosuvastatin (CRESTOR) 20 MG tablet Take 20 mg by mouth daily.    Marland Kitchen topiramate (TOPAMAX) 100 MG tablet Take 1.5  tablets (150 mg total) by mouth daily. 150 MG AM AND 200 MG PM ,BEING PRESCRIBED PER NEUROLOGY 135 tablet 1  . Ubrogepant 100 MG TABS Take by mouth.     No current facility-administered medications for this visit.     Musculoskeletal: Strength & Muscle Tone: UTA Gait & Station: normal Patient leans: N/A  Psychiatric Specialty Exam: Review of Systems  Psychiatric/Behavioral: Positive for sleep disturbance.  All other systems reviewed and are negative.   There were no vitals taken for this visit.There is no height or weight on file to calculate BMI.  General Appearance: Casual  Eye Contact:  Fair  Speech:  Clear and Coherent  Volume:  Normal  Mood:  Euthymic  Affect:  Appropriate  Thought Process:  Goal Directed and Descriptions of Associations: Intact  Orientation:  Full (Time, Place, and Person)  Thought Content: Logical   Suicidal Thoughts:  No  Homicidal Thoughts:  No  Memory:  Immediate;   Fair Recent;   Fair Remote;   Fair  Judgement:  Fair  Insight:  Fair  Psychomotor Activity:  Normal  Concentration:  Concentration: Fair and Attention Span: Fair  Recall:  [employer] of Knowledge: Fair  Language: Fair  Akathisia:  No  Handed:  Right  AIMS (if indicated): UTA  Assets:  Communication Skills Desire for Improvement Housing Social Support  ADL's:  Intact  Cognition: WNL  Sleep:  Excessive during the day   Screenings: AUDIT     Office Visit from 05/22/2016 in [facility]  Alcohol Use Disorder Identification Test Final Score (AUDIT) 0    GAD-7     Office Visit from 08/10/2019 in [facility]  Total GAD-7 Score 14    PHQ2-9     Office Visit from 08/10/2019 in [facility] Visit [facility]/22/2016 in [facility]  PHQ-2 Total Score 3 4  PHQ-9 Total Score 15 18       Assessment and Plan: Naarah Reigan Tolliver is a 49 year old Caucasian female with a history of panic attacks, MDD, tobacco  use disorder, caffeine use disorder, insomnia was evaluated by telemedicine today.  Patient is currently struggling with excessive sleepiness.  Plan as noted below.  Plan MDD and remission Wellbutrin 100 mg p.o. twice daily Cymbalta 60 mg p.o. twice daily Neurontin 600 mg p.o. twice daily Seroquel 150 mg p.o. nightly Will await pending sleep study prior to reducing her medication dosages to address her excessive sleepiness during the day.   Panic attacks-stable Cymbalta as prescribed Gabapentin 600 mg p.o. twice daily Propranolol 10 mg p.o. 3 times daily as needed  Insomnia-stable at night Seroquel 150 mg p.o. 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 Zadie Rhine.  Follow-up in clinic in 4 weeks or sooner if needed.  I have spent atleast 20 minutes- face to face with patient today. More than 50 % of the time was spent for preparing to see the patient ( e.g., review of test, records ),  ordering medications and test ,psychoeducation and supportive psychotherapy and care coordination,as well as documenting clinical information in electronic health record. This note was generated in part or whole with voice recognition software. Voice recognition is usually quite accurate but there are transcription errors that can and very often do occur. I apologize for any typographical errors that were not detected and corrected.       Mary Alert, MD 08/29/2020, 8:03 AM

## 2020-08-31 DIAGNOSIS — R202 Paresthesia of skin: Secondary | ICD-10-CM | POA: Diagnosis not present

## 2020-08-31 DIAGNOSIS — R200 Anesthesia of skin: Secondary | ICD-10-CM | POA: Diagnosis not present

## 2020-08-31 DIAGNOSIS — G5601 Carpal tunnel syndrome, right upper limb: Secondary | ICD-10-CM | POA: Diagnosis not present

## 2020-08-31 DIAGNOSIS — M25532 Pain in left wrist: Secondary | ICD-10-CM | POA: Diagnosis not present

## 2020-08-31 DIAGNOSIS — G5602 Carpal tunnel syndrome, left upper limb: Secondary | ICD-10-CM | POA: Diagnosis not present

## 2020-08-31 DIAGNOSIS — G5603 Carpal tunnel syndrome, bilateral upper limbs: Secondary | ICD-10-CM | POA: Diagnosis not present

## 2020-08-31 DIAGNOSIS — M25531 Pain in right wrist: Secondary | ICD-10-CM | POA: Diagnosis not present

## 2020-09-06 DIAGNOSIS — E538 Deficiency of other specified B group vitamins: Secondary | ICD-10-CM | POA: Diagnosis not present

## 2020-09-08 ENCOUNTER — Ambulatory Visit: Payer: Medicare HMO | Admitting: Licensed Clinical Social Worker

## 2020-09-18 ENCOUNTER — Ambulatory Visit (INDEPENDENT_AMBULATORY_CARE_PROVIDER_SITE_OTHER): Payer: Medicare HMO | Admitting: Licensed Clinical Social Worker

## 2020-09-18 ENCOUNTER — Encounter: Payer: Self-pay | Admitting: Licensed Clinical Social Worker

## 2020-09-18 ENCOUNTER — Other Ambulatory Visit: Payer: Self-pay

## 2020-09-18 DIAGNOSIS — E538 Deficiency of other specified B group vitamins: Secondary | ICD-10-CM | POA: Diagnosis not present

## 2020-09-18 DIAGNOSIS — F4000 Agoraphobia, unspecified: Secondary | ICD-10-CM | POA: Diagnosis not present

## 2020-09-18 DIAGNOSIS — F410 Panic disorder [episodic paroxysmal anxiety] without agoraphobia: Secondary | ICD-10-CM | POA: Diagnosis not present

## 2020-09-18 NOTE — Progress Notes (Signed)
Patient Location: Home  Provider Location: Home Office   Virtual Visit via Video Note  I connected with Mary Koch on 09/18/20 at 10:00 AM EDT by a video enabled telemedicine application and verified that I am speaking with the correct person using two identifiers.   I discussed the limitations of evaluation and management by telemedicine and the availability of in person appointments. The patient expressed understanding and agreed to proceed.  THERAPY PROGRESS NOTE  Session Time: 30 Minutes  Participation Level: Active  Behavioral Response: CasualAlertAnxious  Type of Therapy: Individual Therapy  Treatment Goals addressed: Anxiety and Coping  Interventions: CBT  Summary: Mary Koch is a 49 y.o. female who presents with anxiety sxs. Pt reported that that she went on a family vacation since last session. Pt reported having trouble with anxiety while on vacation related to brother who has started cancer treatment and being social around others at the resort. Pt reported she did allow son to convince her to go places and hang out at the pool. Pt reported it was a bit easier to manage anxiety at pool because there were not a lot of people present. Pt reported since coming back home she has not left the house to socialize and has obtained 2 new pets to take care of. Pt discussed her love caring for the animals, which is a positive outlet.   Suicidal/Homicidal: No  Therapist Response: Therapist met with patient for follow up session. Therapist and patient explored social interactions and anxiety rating since last session. Therapist encouraged patient to pair her love of dogs with something anxiety provoking to work through fears, such as going to a dog park or other place that dogs are welcome. Pt was receptive.  Plan: Return again in 2 weeks.  Diagnosis: Axis I: Panic Disorder and Agoraphobia    Axis II: N/A  Josephine Igo, LCSW, LCAS 09/18/2020

## 2020-09-21 ENCOUNTER — Other Ambulatory Visit: Payer: Medicare HMO

## 2020-09-25 DIAGNOSIS — E538 Deficiency of other specified B group vitamins: Secondary | ICD-10-CM | POA: Diagnosis not present

## 2020-10-02 DIAGNOSIS — E538 Deficiency of other specified B group vitamins: Secondary | ICD-10-CM | POA: Diagnosis not present

## 2020-10-06 ENCOUNTER — Other Ambulatory Visit: Payer: Self-pay

## 2020-10-06 ENCOUNTER — Ambulatory Visit (INDEPENDENT_AMBULATORY_CARE_PROVIDER_SITE_OTHER): Payer: Medicare HMO | Admitting: Licensed Clinical Social Worker

## 2020-10-06 ENCOUNTER — Encounter: Payer: Self-pay | Admitting: Licensed Clinical Social Worker

## 2020-10-06 DIAGNOSIS — F411 Generalized anxiety disorder: Secondary | ICD-10-CM

## 2020-10-06 DIAGNOSIS — F410 Panic disorder [episodic paroxysmal anxiety] without agoraphobia: Secondary | ICD-10-CM | POA: Diagnosis not present

## 2020-10-06 DIAGNOSIS — F4000 Agoraphobia, unspecified: Secondary | ICD-10-CM

## 2020-10-06 DIAGNOSIS — F331 Major depressive disorder, recurrent, moderate: Secondary | ICD-10-CM | POA: Diagnosis not present

## 2020-10-06 NOTE — Progress Notes (Signed)
Patient Location: Home  Provider Location: Home Office   Virtual Visit via Video Note  I connected with Mary Koch on 10/06/20 at  8:00 AM EDT by a video enabled telemedicine application and verified that I am speaking with the correct person using two identifiers.   I discussed the limitations of evaluation and management by telemedicine and the availability of in person appointments. The patient expressed understanding and agreed to proceed.  Comprehensive Clinical Re-Assessment (CCA) Note  10/06/2020 Mary Koch [id number]  Visit Diagnosis:      ICD-10-CM   1. Panic disorder  F41.0   2. Agoraphobia  F40.00   3. Generalized anxiety disorder  F41.1   4. Major depressive disorder, recurrent episode, moderate (HCC)  F33.1      CCA Biopsychosocial  Intake/Chief Complaint:  CCA Intake With Chief Complaint CCA Part Two Date: 10/06/20 CCA Part Two Time: 0800 Chief Complaint/Presenting Problem: Pt presents as a 49 year old, Caucasian, female for re-assessment. Pt has been meeting with this therapist since late February of 2021 to work through anxiety and engage more socially with others. Therapist and patient are working on developing more positive coping skills to manage anxiety and discussing ways to gradually expose patient to stress inducing events. Pt reported that she is also feeling more depressed lately due to concern for the health of her family members. Both patient's mother and brother have cancer and are being treated, with no determination yet of prognosis. Therapist completed PHQ-9 and GAD-7 for this update and has added dx of GAD and MDD. Previous dx of Panic Disorder with Agoraphobia remains. Therapist discussed with patient treatment plan progress for anxiety and added depression goals to treatment plan for update. Patient's Currently Reported Symptoms/Problems: Anxiety, Panic Attacks, Agoraphobia, Limited Social Interactions, Limited Coping Skills/Outlets, Depression,  Family Illness Individual's Strengths: Pt reported "my oldest son is encouraging me to get outside the house more" and spending more time together. Individual's Preferences: Pt reported she prefers a therapist who is conversational. Individual's Abilities: Pt enjoys taking care of her children and had interest in working with animals. Type of Services Patient Feels Are Needed: Individual Therapy  Mental Health Symptoms Depression:  Depression: Increase/decrease in appetite, Sleep (too much or little), Weight gain/loss, Difficulty Concentrating, Fatigue, Tearfulness, Duration of symptoms greater than two weeks  Mania:  Mania: N/A  Anxiety:   Anxiety: Worrying, Tension, Fatigue, Difficulty concentrating, Sleep, Restlessness  Psychosis:  Psychosis: None  Trauma:  Trauma: N/A  Obsessions:  Obsessions: N/A  Compulsions:  Compulsions: N/A  Inattention:  Inattention: Forgetful  Hyperactivity/Impulsivity:  Hyperactivity/Impulsivity: N/A  Oppositional/Defiant Behaviors:  Oppositional/Defiant Behaviors: N/A  Emotional Irregularity:  Emotional Irregularity: N/A  Other Mood/Personality Symptoms:  Other Mood/Personality Symptoms: Pt denies current SI.   Mental Status Exam Appearance and self-care  Stature:  Stature: Average  Weight:  Weight: Overweight  Clothing:  Clothing: Casual  Grooming:  Grooming: Normal  Cosmetic use:  Cosmetic Use: None  Posture/gait:  Posture/Gait: Normal  Motor activity:  Motor Activity: Not Remarkable  Sensorium  Attention:  Attention: Normal  Concentration:  Concentration: Normal  Orientation:  Orientation: X5  Recall/memory:  Recall/Memory: Normal  Affect and Mood  Affect:  Affect: Flat (tired and drowsy, often dosing off)  Mood:  Mood: Anxious, Depressed  Relating  Eye contact:  Eye Contact: Normal  Facial expression:  Facial Expression: Responsive  Attitude toward examiner:  Attitude Toward Examiner: Cooperative  Thought and Language  Speech flow: Speech  Flow: Normal  Thought content:  Thought Content: Appropriate to Mood and Circumstances  Preoccupation:  Preoccupations: Ruminations  Hallucinations:  Hallucinations: None  Organization:     Transport planner of Knowledge:  Fund of Knowledge: Average  Intelligence:  Intelligence: Average  Abstraction:  Abstraction: Normal  Judgement:  Judgement: Fair  Art therapist:  Reality Testing: Adequate  Insight:  Insight: Flashes of insight, Gaps  Decision Making:  Decision Making: Paralyzed (Pt reported intense fears of being around others which impacts her ability to leave the home.)  Social Functioning  Social Maturity:  Social Maturity: Isolates  Social Judgement:  Social Judgement: Normal  Stress  Stressors:  Stressors: Grief/losses  Coping Ability:  Coping Ability: Deficient supports  Skill Deficits:  Skill Deficits: Decision making  Supports:  Supports: Support needed, Family     Religion: Religion/Spirituality Are You A Religious Person?: No  Leisure/Recreation: Leisure / Recreation Do You Have Hobbies?: Yes Leisure and Hobbies: Taking care of her pets  Exercise/Diet: Exercise/Diet Do You Exercise?: Yes What Type of Exercise Do You Do?: Run/Walk How Many Times a Week Do You Exercise?: 1-3 times a week Have You Gained or Lost A Significant Amount of Weight in the Past Six Months?: Yes-Gained Do You Follow a Special Diet?: No (eating 1 meal a day and drinking beverages the rest of the day) Do You Have Any Trouble Sleeping?: Yes Explanation of Sleeping Difficulties: oversleeping   CCA Employment/Education  Employment/Work Situation: Employment / Work Situation Employment situation: On disability Why is patient on disability: Pt reported "depression and carpel tunnel". 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 Did You Attend Graduate School?: No Did You Have An Individualized Education Program (IIEP): No Did You Have Any Difficulty At School?: Yes Were Any Medications Ever Prescribed For These Difficulties?: Yes Medications Prescribed For School Difficulties?: Pt does not remember   CCA Family/Childhood History  Family and Relationship History: Family history Marital status: Single Are you sexually active?: No What is your sexual orientation?: Straight Does patient have children?: Yes How many children?: 2 How is patient's relationship with their children?: Pt has close relationships with her 2 sons.  Childhood History:  Childhood History By whom was/is the patient raised?: Both parents Additional childhood history information: Born in [place]. describes childhood as normal, happy, played outside in the dirt and rode bikes Description of patient's relationship with caregiver when they were a child: Positive Patient's description of current relationship with people who raised him/her: Pt reported worried about her mother's health. She was dx with breast cancer. How were you disciplined when you got in trouble as a child/adolescent?: " i cant remember.  Probably received a spanking." Does patient have siblings?: Yes Description of patient's current relationship with siblings: Still in contact with siblings, however since brother has been dx w/ cancer pt reported she often worries about his health and sometimes avoids being around him, does not  like seeing him in pain. Did patient suffer any verbal/emotional/physical/sexual abuse as a child?: No Did patient suffer from severe childhood neglect?: No Has patient ever been sexually abused/assaulted/raped as an adolescent or adult?: No Was the patient ever a victim of a crime or a disaster?: No Witnessed domestic violence?: No Has patient been affected by domestic violence as an adult?: No       CCA Substance Use  Alcohol/Drug Use: Alcohol / Drug Use Pain Medications: SEE MAR Prescriptions: SEE MAR Over the Counter: SEE MAR History of alcohol / drug use?: No history of alcohol / drug abuse                            Recommendations for Services/Supports/Treatments: Recommendations for Services/Supports/Treatments Recommendations For Services/Supports/Treatments: Individual Therapy, Medication Management  DSM5 Diagnoses: Patient Active Problem List   Diagnosis Date Noted  . MDD (major depressive disorder), recurrent, in full remission ([place]) 07/25/2020  . Tick bite 07/10/2020  . Blurred vision 07/10/2020  . Lethargy 07/10/2020  . High risk medication use 04/06/2020  . Panic disorder 07/28/2019  . Bereavement 07/28/2019  . MDD (major depressive disorder), recurrent episode, with atypical features ([place]) 07/28/2019  . Agoraphobia 07/28/2019  . Insomnia due to other mental disorder 07/28/2019  . 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 Tobacco use disorder 12/16/2017  . Anxiety and depression 06/25/2015  . Carpal tunnel syndrome 06/25/2015  . Family planning 06/25/2015  . Bloodgood disease 06/25/2015  . Fibroid 06/25/2015  . Acid reflux 06/25/2015  . Excess, menstruation 06/25/2015  . Increased BMI 06/25/2015  . Adiposity 06/25/2015  . Dysmenorrhea 06/25/2015  . Disease of thyroid gland 06/25/2015  . Compulsive tobacco user syndrome 06/25/2015  . Episodic tension type headache 08/23/2014  . Allergic rhinitis 07/23/2013  . Wheezing 07/23/2013  . Lower  urinary tract infectious disease 03/11/2013  . Hematuria 03/11/2013  . D (diarrhea) 01/13/2013  . Vomiting 01/13/2013  . Amenorrhea 12/22/2012  . HLD (hyperlipidemia) 08/31/2011    Patient Centered Plan: Patient is on the following Treatment Plan(s):  Anxiety and Depression  Follow Up Instructions:  I discussed the re-assessment and treatment plan with the patient. The patient was provided an opportunity to ask questions and all were answered. The patient agreed with the plan and demonstrated an understanding of the instructions.   The patient was advised to call back or seek an in-person evaluation if the symptoms worsen or if the condition fails to improve as anticipated.  I provided 45 minutes of non-face-to-face time during this encounter.   Mariyah Upshaw Wynelle Link, LCSW, LCAS

## 2020-10-11 ENCOUNTER — Telehealth (INDEPENDENT_AMBULATORY_CARE_PROVIDER_SITE_OTHER): Payer: Medicare HMO | Admitting: Psychiatry

## 2020-10-11 ENCOUNTER — Encounter: Payer: Self-pay | Admitting: Psychiatry

## 2020-10-11 ENCOUNTER — Other Ambulatory Visit: Payer: Self-pay

## 2020-10-11 DIAGNOSIS — F3342 Major depressive disorder, recurrent, in full remission: Secondary | ICD-10-CM

## 2020-10-11 DIAGNOSIS — F4000 Agoraphobia, unspecified: Secondary | ICD-10-CM | POA: Diagnosis not present

## 2020-10-11 DIAGNOSIS — F5105 Insomnia due to other mental disorder: Secondary | ICD-10-CM

## 2020-10-11 DIAGNOSIS — F17200 Nicotine dependence, unspecified, uncomplicated: Secondary | ICD-10-CM

## 2020-10-11 DIAGNOSIS — F1590 Other stimulant use, unspecified, uncomplicated: Secondary | ICD-10-CM | POA: Diagnosis not present

## 2020-10-11 DIAGNOSIS — F99 Mental disorder, not otherwise specified: Secondary | ICD-10-CM

## 2020-10-11 DIAGNOSIS — F410 Panic disorder [episodic paroxysmal anxiety] without agoraphobia: Secondary | ICD-10-CM | POA: Diagnosis not present

## 2020-10-11 MED ORDER — ZOLPIDEM TARTRATE 10 MG PO TABS: 10.0000 mg | ORAL_TABLET | Freq: Every evening | ORAL | 1 refills | Status: DC | PRN

## 2020-10-11 MED ORDER — DULOXETINE HCL 60 MG PO CPEP: 60.0000 mg | ORAL_CAPSULE | Freq: Two times a day (BID) | ORAL | 0 refills | Status: DC

## 2020-10-11 NOTE — Patient Instructions (Signed)
Zolpidem tablets What is this medicine? ZOLPIDEM (zole PI dem) is used to treat insomnia. This medicine helps you to fall asleep and sleep through the night. This medicine may be used for other purposes; ask your health care provider or pharmacist if you have questions. COMMON BRAND NAME(S): Ambien What should I tell my health care provider before I take this medicine? They need to know if you have any of these conditions:  depression  history of drug abuse or addiction  if you often drink alcohol  liver disease  lung or breathing disease  myasthenia gravis  sleep apnea  sleep-walking, driving, eating or other activity while not fully awake after taking a sleep medicine  suicidal thoughts, plans, or attempt; a previous suicide attempt by you or a family member  an unusual or allergic reaction to zolpidem, other medicines, foods, dyes, or preservatives  pregnant or trying to get pregnant  breast-feeding How should I use this medicine? Take this medicine by mouth with a glass of water. Follow the directions on the prescription label. It is better to take this medicine on an empty stomach and only when you are ready for bed. Do not take your medicine more often than directed. If you have been taking this medicine for several weeks and suddenly stop taking it, you may get unpleasant withdrawal symptoms. Your doctor or health care professional may want to gradually reduce the dose. Do not stop taking this medicine on your own. Always follow your doctor or health care professional's advice. A special MedGuide will be given to you by the pharmacist with each prescription and refill. Be sure to read this information carefully each time. Talk to your pediatrician regarding the use of this medicine in children. Special care may be needed. Overdosage: If you think you have taken too much of this medicine contact a poison control center or emergency room at once. NOTE: This medicine is only  for you. Do not share this medicine with others. What if I miss a dose? This does not apply. This medicine should only be taken immediately before going to sleep. Do not take double or extra doses. What may interact with this medicine?  alcohol  antihistamines for allergy, cough and cold  certain medicines for anxiety or sleep  certain medicines for depression, like amitriptyline, fluoxetine, sertraline  certain medicines for fungal infections like ketoconazole and itraconazole  certain medicines for seizures like phenobarbital, primidone  ciprofloxacin  dietary supplements for sleep, like valerian or kava kava  general anesthetics like halothane, isoflurane, methoxyflurane, propofol  local anesthetics like lidocaine, pramoxine, tetracaine  medicines that relax muscles for surgery  narcotic medicines for pain  phenothiazines like chlorpromazine, mesoridazine, prochlorperazine, thioridazine  rifampin This list may not describe all possible interactions. Give your health care provider a list of all the medicines, herbs, non-prescription drugs, or dietary supplements you use. Also tell them if you smoke, drink alcohol, or use illegal drugs. Some items may interact with your medicine. What should I watch for while using this medicine? Visit your doctor or health care professional for regular checks on your progress. Keep a regular sleep schedule by going to bed at about the same time each night. Avoid caffeine-containing drinks in the evening hours. When sleep medicines are used every night for more than a few weeks, they may stop working. Talk to your doctor if you still have trouble sleeping. After taking this medicine, you may get up out of bed and do an activity that you   do not know you are doing. The next morning, you may have no memory of this. Activities include driving a car ("sleep-driving"), making and eating food, talking on the phone, sexual activity, and sleep-walking.  Serious injuries have occurred. Stop the medicine and call your doctor right away if you find out you have done any of these activities. Do not take this medicine if you have used alcohol that evening. Do not take it if you have taken another medicine for sleep. The risk of doing these sleep-related activities is higher. Wait for at least 8 hours after you take a dose before driving or doing other activities that require full mental alertness. Do not take this medicine unless you are able to stay in bed for a full night (7 to 8 hours) before you must be active again. You may have a decrease in mental alertness the day after use, even if you feel that you are fully awake. Tell your doctor if you will need to perform activities requiring full alertness, such as driving, the next day. Do not stand or sit up quickly after taking this medicine, especially if you are an older patient. This reduces the risk of dizzy or fainting spells. If you or your family notice any changes in your behavior, such as new or worsening depression, thoughts of harming yourself, anxiety, other unusual or disturbing thoughts, or memory loss, call your doctor right away. After you stop taking this medicine, you may have trouble falling asleep. This is called rebound insomnia. This problem usually goes away on its own after 1 or 2 nights. What side effects may I notice from receiving this medicine? Side effects that you should report to your doctor or health care professional as soon as possible:  allergic reactions like skin rash, itching or hives, swelling of the face, lips, or tongue  breathing problems  changes in vision  confusion  depressed mood or other changes in moods or emotions  feeling faint or lightheaded, falls  hallucinations  loss of balance or coordination  loss of memory  numbness or tingling of the tongue  restlessness, excitability, or feelings of anxiety or agitation  signs and symptoms of liver  injury like dark yellow or brown urine; general ill feeling or flu-like symptoms; light-colored stools; loss of appetite; nausea; right upper belly pain; unusually weak or tired; yellowing of the eyes or skin  suicidal thoughts  unusual activities while not fully awake like driving, eating, making phone calls, or sexual activity Side effects that usually do not require medical attention (report to your doctor or health care professional if they continue or are bothersome):  dizziness  drowsiness the day after you take this medicine  headache This list may not describe all possible side effects. Call your doctor for medical advice about side effects. You may report side effects to FDA at 1-800-FDA-1088. Where should I keep my medicine? Keep out of the reach of children. This medicine can be abused. Keep your medicine in a safe place to protect it from theft. Do not share this medicine with anyone. Selling or giving away this medicine is dangerous and against the law. This medicine may cause accidental overdose and death if taken by other adults, children, or pets. Mix any unused medicine with a substance like cat litter or coffee grounds. Then throw the medicine away in a sealed container like a sealed bag or a coffee can with a lid. Do not use the medicine after the expiration date. Store at   room temperature between 20 and 25 degrees C (68 and 77 degrees F). NOTE: This sheet is a summary. It may not cover all possible information. If you have questions about this medicine, talk to your doctor, pharmacist, or health care provider.  2020 Elsevier/Gold Standard (2018-09-04 11:51:08)  

## 2020-10-11 NOTE — Progress Notes (Signed)
Provider Location : [facility] Patient Location : Home  Participants: Patient , Provider  Virtual Visit via Video Note  I connected with Mary Koch on 10/11/20 at  4:00 PM EDT by a video enabled telemedicine application and verified that I am speaking with the correct person using two identifiers.   I discussed the limitations of evaluation and management by telemedicine and the availability of in person appointments. The patient expressed understanding and agreed to proceed.    I discussed the assessment and treatment plan with the patient. The patient was provided an opportunity to ask questions and all were answered. The patient agreed with the plan and demonstrated an understanding of the instructions.   The patient was advised to call back or seek an in-person evaluation if the symptoms worsen or if the condition fails to improve as anticipated.    [place] MD OP Progress Note  10/11/2020 5:33 PM [place]  MRN:  [id number]  Chief Complaint:  Chief Complaint    Follow-up     HPI: Mary Koch is a 49 year old Caucasian female, lives in [place], has a history of MDD, bereavement, panic disorder, agoraphobia, tobacco use disorder, insomnia, caffeine use disorder was evaluated by telemedicine today.  Patient today reports she continues to struggle with excessive sleepiness during the day and sleep issues at night.  She reports she wakes up several times at night and it takes her 2 hours or so to fall back asleep again.  She feels tired during the day due to the same.  Patient reports she had sleep study done sometime in July and still waiting for the results.  She otherwise reports her mood symptoms as okay on the current medication regimen.  Patient denies any suicidality, homicidality or perceptual disturbances.  She reports she is willing to cut back on smoking cigarettes.  She also reports she has been trying to cut back on her caffeine use.  Patient denies any other  concerns today.  Visit Diagnosis:    ICD-10-CM   1. MDD (major depressive disorder), recurrent, in full remission ([place])  F33.42   2. Panic disorder  F41.0 DULoxetine (CYMBALTA) 60 MG capsule  3. Agoraphobia  F40.00   4. Insomnia due to other mental disorder  F51.05 zolpidem (AMBIEN) 10 MG tablet   F99   5. Tobacco use disorder  F17.200   6. 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  Past trials of medications like Paxil, Luvox, Klonopin, Zoloft, Seroquel, Wellbutrin  Past Medical History:  Past Medical History:  Diagnosis Date  . Allergic rhinitis   . Anemia   . Anxiety   . Anxiety and depression   . Carpal tunnel syndrome   . COVID-19 01/15/2020  . Depression   . Fibroids   . GERD (gastroesophageal reflux disease)   . Headache(784.0)   . Headache(784.0)   . Heavy periods   . Hyperthyroidism    right portion of thyroid removed  . Muscle spasm   . Neurological disorder    evaluation for ms  . Painful menstrual periods   . Shortness of breath    when i smoke a lot    Past Surgical History:  Procedure Laterality Date  . BREAST BIOPSY Left    neg  . carpel tunnel Left 2017  . CESAREAN SECTION     times 2  . CHOLECYSTECTOMY    . THYROID LOBECTOMY  Family Psychiatric History: I have reviewed family psychiatric history from my progress note on 03/24/2018.  Family History:  Family History  Problem Relation Age of Onset  . Osteoporosis Mother   . Diabetes Father   . Anxiety disorder Father   . Depression Father   . Post-traumatic stress disorder Father   . Heart disease Father   . Anxiety disorder Sister   . Alcohol abuse Brother   . Breast cancer Neg Hx   . Colon cancer Neg Hx   . Ovarian cancer Neg Hx     Social History: I have reviewed social history from my progress note on 03/24/2018. Social History   Socioeconomic History  . Marital status: Legally  Separated    Spouse name: Not on file  . Number of children: Not on file  . Years of education: Not on file  . Highest education level: Not on file  Occupational History  . Not on file  Tobacco Use  . Smoking status: Current Every Day Smoker    Packs/day: 0.50    Years: 25.00    Pack years: 12.50    Types: Cigarettes    Start date: 05/22/1986  . Smokeless tobacco: Never Used  Vaping Use  . Vaping Use: Never used  Substance and Sexual Activity  . Alcohol use: No    Alcohol/week: 0.0 standard drinks  . Drug use: No  . Sexual activity: Yes    Partners: Male    Birth control/protection: Surgical, Injection  Other Topics Concern  . Not on file  Social History Narrative  . Not on file   Social Determinants of Health   Financial Resource Strain:   . Difficulty of Paying Living Expenses: Not on file  Food Insecurity:   . Worried About Charity fundraiser in the Last Year: Not on file  . Ran Out of Food in the Last Year: Not on file  Transportation Needs:   . Lack of Transportation (Medical): Not on file  . Lack of Transportation (Non-Medical): Not on file  Physical Activity:   . Days of Exercise per Week: Not on file  . Minutes of Exercise per Session: Not on file  Stress:   . Feeling of Stress : Not on file  Social Connections:   . Frequency of Communication with Friends and Family: Not on file  . Frequency of Social Gatherings with Friends and Family: Not on file  . Attends Religious Services: Not on file  . Active Member of Clubs or Organizations: Not on file  . Attends Archivist Meetings: Not on file  . Marital Status: Not on file    Allergies:  Allergies  Allergen Reactions  . Azithromycin Other (See Comments)    headache  . Codeine Other (See Comments)    unknown  . Penicillins Other (See Comments)    headaches    Metabolic Disorder Labs: No results found for: HGBA1C, MPG No results found for: PROLACTIN No results found for: CHOL, TRIG, HDL,  CHOLHDL, VLDL, LDLCALC No results found for: TSH  Therapeutic Level Labs: No results found for: LITHIUM No results found for: VALPROATE No components found for:  CBMZ  Current Medications: Current Outpatient Medications  Medication Sig Dispense Refill  . ergocalciferol (VITAMIN D2) 1.25 MG (50000 UT) capsule Take by mouth.    Marland Kitchen albuterol (VENTOLIN HFA) 108 (90 Base) MCG/ACT inhaler INHALE 1 TO 2 PUFFS DAILY 8 g 5  . BREO ELLIPTA 100-25 MCG/INH AEPB Inhale 1 puff into the lungs  daily. 28 each 4  . buPROPion (WELLBUTRIN SR) 100 MG 12 hr tablet Take 1 tablet (100 mg total) by mouth 2 (two) times daily. 180 tablet 0  . celecoxib (CELEBREX) 100 MG capsule     . cyclobenzaprine (FLEXERIL) 10 MG tablet Take 10 mg by mouth 3 (three) times daily as needed for muscle spasms.    . DULoxetine (CYMBALTA) 60 MG capsule Take 1 capsule (60 mg total) by mouth 2 (two) times daily. 180 capsule 0  . fluticasone (FLONASE) 50 MCG/ACT nasal spray Place 1 spray into both nostrils daily.  4  . furosemide (LASIX) 20 MG tablet TAKE 1 TABLET BY MOUTH TWICE A WEEK  5  . gabapentin (NEURONTIN) 600 MG tablet Take 1 tablet (600 mg total) by mouth 2 (two) times daily. 180 tablet 1  . medroxyPROGESTERone (DEPO-PROVERA) 150 MG/ML injection     . medroxyPROGESTERone Acetate 150 MG/ML SUSY INJECT 1 ML (150 MG TOTAL) INTO THE MUSCLE EVERY 3 (THREE) MONTHS. 1 mL 3  . meloxicam (MOBIC) 7.5 MG tablet Take 1 tablet (7.5 mg total) by mouth daily. 30 tablet 3  . omeprazole (PRILOSEC) 40 MG capsule Take 1 capsule (40 mg total) by mouth daily. 90 capsule 3  . propranolol (INDERAL) 10 MG tablet TAKE 1 TABLET BY MOUTH THREE TIMES A DAY AS NEEDED 270 tablet 1  . rosuvastatin (CRESTOR) 20 MG tablet Take 20 mg by mouth daily.    Marland Kitchen topiramate (TOPAMAX) 100 MG tablet Take 1.5 tablets (150 mg total) by mouth daily. 150 MG AM AND 200 MG PM ,BEING PRESCRIBED PER NEUROLOGY 135 tablet 1  . Ubrogepant 100 MG TABS Take by mouth.    . Vitamin D,  Ergocalciferol, (DRISDOL) 1.25 MG (50000 UNIT) CAPS capsule     . zolpidem (AMBIEN) 10 MG tablet Take 1 tablet (10 mg total) by mouth at bedtime as needed for sleep. 15 tablet 1   No current facility-administered medications for this visit.     Musculoskeletal: Strength & Muscle Tone: UTA Gait & Station: normal Patient leans: N/A  Psychiatric Specialty Exam: Review of Systems  Psychiatric/Behavioral: Positive for sleep disturbance.  All other systems reviewed and are negative.   There were no vitals taken for this visit.There is no height or weight on file to calculate BMI.  General Appearance: Casual  Eye Contact:  Fair  Speech:  Clear and Coherent  Volume:  Normal  Mood:  Euthymic  Affect:  Congruent  Thought Process:  Goal Directed and Descriptions of Associations: Intact  Orientation:  Full (Time, Place, and Person)  Thought Content: Logical   Suicidal Thoughts:  No  Homicidal Thoughts:  No  Memory:  Immediate;   Fair Recent;   Fair Remote;   Fair  Judgement:  Fair  Insight:  Fair  Psychomotor Activity:  Normal  Concentration:  Concentration: Fair and Attention Span: Fair  Recall:  [employer] of Knowledge: Fair  Language: Fair  Akathisia:  No  Handed:  Right  AIMS (if indicated): UTA  Assets:  Communication Skills Desire for Improvement Housing Social Support  ADL's:  Intact  Cognition: WNL  Sleep:  Poor   Screenings: AUDIT     Office Visit from 05/22/2016 in [facility]  Alcohol Use Disorder Identification Test Final Score (AUDIT) 0    GAD-7     Counselor from 10/06/2020 in [facility] Office Visit from 08/10/2019 in [facility]  Total GAD-7 Score 15 14  PHQ2-9     Counselor from 10/06/2020 in [facility] Office Visit from 08/10/2019 in [facility] Visit from 05/22/2016 in [facility]  PHQ-2 Total Score 4 3 4    PHQ-9 Total Score 19 15 18        Assessment and Plan: Mary Koch is a 49 year old Caucasian female with a history of MDD, panic attacks, tobacco use disorder, caffeine use disorder, insomnia was evaluated by telemedicine today.  Patient is currently struggling with sleep problems.  Plan as noted below.  Plan MDD in remission Wellbutrin 100 mg p.o. twice daily Cymbalta 60 mg p.o. twice daily Neurontin 600 mg p.o. twice daily Discontinue Seroquel.  Panic attacks-stable Cymbalta as prescribed Gabapentin 600 mg p.o. twice daily Propranolol 10 mg p.o. 3 times daily as needed   Insomnia-unstable Discontinue Seroquel. Start Ambien 10 mg p.o. nightly as needed Discussed sleep hygiene techniques.  Agoraphobia-stable Continue CBT as needed  Tobacco use disorder-improving Provided counseling  Caffeine use disorder-improving Provided counseling  Pending labs-TSH, prolactin, hemoglobin A1c, lipid panel.  Continue CBT with Ms. Zadie Rhine as needed  Follow-up in clinic in 4 weeks or sooner if needed.  I have spent atleast 20 minutes  face to face by video with patient today. More than 50 % of the time was spent for preparing to see the patient ( e.g., review of test, records ),  ordering medications and test ,psychoeducation and supportive psychotherapy and care coordination,as well as documenting clinical information in electronic health record. This note was generated in part or whole with voice recognition software. Voice recognition is usually quite accurate but there are transcription errors that can and very often do occur. I apologize for any typographical errors that were not detected and corrected.     Ursula Alert, MD 10/12/2020, 8:27 AM

## 2020-10-20 ENCOUNTER — Other Ambulatory Visit: Payer: Self-pay

## 2020-10-20 ENCOUNTER — Encounter: Payer: Self-pay | Admitting: Licensed Clinical Social Worker

## 2020-10-20 ENCOUNTER — Ambulatory Visit (INDEPENDENT_AMBULATORY_CARE_PROVIDER_SITE_OTHER): Payer: Medicare HMO | Admitting: Licensed Clinical Social Worker

## 2020-10-20 DIAGNOSIS — F331 Major depressive disorder, recurrent, moderate: Secondary | ICD-10-CM | POA: Diagnosis not present

## 2020-10-20 DIAGNOSIS — F410 Panic disorder [episodic paroxysmal anxiety] without agoraphobia: Secondary | ICD-10-CM | POA: Diagnosis not present

## 2020-10-20 DIAGNOSIS — F4000 Agoraphobia, unspecified: Secondary | ICD-10-CM

## 2020-10-20 DIAGNOSIS — Z634 Disappearance and death of family member: Secondary | ICD-10-CM | POA: Diagnosis not present

## 2020-10-20 NOTE — Progress Notes (Signed)
Virtual Visit via Video Note  I connected with [name] on 10/20/20 at 11:00 AM EDT by a video enabled telemedicine application and verified that I am speaking with the correct person using two identifiers.  Location: Patient: Home Provider: Home Office   I discussed the limitations of evaluation and management by telemedicine and the availability of in person appointments. The patient expressed understanding and agreed to proceed.  THERAPY PROGRESS NOTE  Session Time: 30 Minutes  Participation Level: Active  Behavioral Response: CasualAlertAnxious and Depressed  Type of Therapy: Individual Therapy  Treatment Goals addressed: Anxiety and Coping  Interventions: CBT  Summary: Mary Koch is a 49 y.o. female who presents with depression and anxiety sxs. Pt reported that she continues to feel worried about family member health issues and recently found out her uncle died. Pt reported she was the one that had to break it to her mother who has cancer. Pt reported she continues to talk with mother on the phone, but can't bring herself to visit due to avoidance behaviors related to her panic disorder w/ agoraphobia. Pt did acknowledge ways she can still make contributions to help family that allow her to process grief and engage socially with family.  Suicidal/Homicidal: No  Therapist Response: Therapist met with patient for follow up session. Therapist and patient explored feelings and thoughts related to grief and ways to make contributions. Therapist assisted patient in coming up with some ways she can stay connected and contribute socially. Pt was somewhat receptive, however continues to lack motivation to move out of her comfort zone.   Plan: Return again in 2 weeks.  Diagnosis: Axis I: Panic Disorder and Agoraphopia, MDD, Recurrent, Moderate    Axis II: N/A  Josephine Igo, LCSW, LCAS 10/20/2020

## 2020-10-23 ENCOUNTER — Other Ambulatory Visit: Payer: Self-pay

## 2020-10-23 ENCOUNTER — Ambulatory Visit (INDEPENDENT_AMBULATORY_CARE_PROVIDER_SITE_OTHER): Payer: Medicare HMO | Admitting: Internal Medicine

## 2020-10-23 ENCOUNTER — Encounter: Payer: Self-pay | Admitting: Internal Medicine

## 2020-10-23 VITALS — BP 126/71 | HR 105 | Ht 66.5 in | Wt 267.6 lb

## 2020-10-23 DIAGNOSIS — E1369 Other specified diabetes mellitus with other specified complication: Secondary | ICD-10-CM | POA: Insufficient documentation

## 2020-10-23 DIAGNOSIS — I10 Essential (primary) hypertension: Secondary | ICD-10-CM | POA: Insufficient documentation

## 2020-10-23 DIAGNOSIS — Z6841 Body Mass Index (BMI) 40.0 and over, adult: Secondary | ICD-10-CM | POA: Diagnosis not present

## 2020-10-23 DIAGNOSIS — F419 Anxiety disorder, unspecified: Secondary | ICD-10-CM

## 2020-10-23 DIAGNOSIS — F3342 Major depressive disorder, recurrent, in full remission: Secondary | ICD-10-CM | POA: Diagnosis not present

## 2020-10-23 DIAGNOSIS — L989 Disorder of the skin and subcutaneous tissue, unspecified: Secondary | ICD-10-CM | POA: Diagnosis not present

## 2020-10-23 LAB — GLUCOSE, POCT (MANUAL RESULT ENTRY): POC Glucose: 164 mg/dl — AB (ref 70–99)

## 2020-10-23 MED ORDER — CELECOXIB 100 MG PO CAPS
100.0000 mg | ORAL_CAPSULE | Freq: Two times a day (BID) | ORAL | 1 refills | Status: AC
Start: 2020-10-23 — End: 2020-11-06

## 2020-10-23 NOTE — Assessment & Plan Note (Signed)
Patient is under the care of a psychiatrist.  She is not suicidal.

## 2020-10-23 NOTE — Progress Notes (Signed)
Established Patient Office Visit  Subjective:  Patient ID: Mary Koch, female    DOB: 05-17-1971  Age: 49 y.o. MRN: [id number]  CC:  Chief Complaint  Patient presents with  . Knee Pain    Patient is here today with complaints of pain in both knees, patient states she has difficulty bearing weight on both knees and trouble walking the stairs. These activities cause sharp pain but the pain is described as dull ache when she is not active. Sxs have been for the last 3 to 4 months.    Knee Pain  The incident occurred more than 1 week ago. The incident occurred at home. There was no injury mechanism. The pain is present in the left knee and right knee. The quality of the pain is described as aching. The pain is at a severity of 7/10. The pain is moderate. Associated symptoms include an inability to bear weight. She reports no foreign bodies present.    Mary Koch presents  For knee pain  Past Medical History:  Diagnosis Date  . Allergic rhinitis   . Anemia   . Anxiety   . Anxiety and depression   . Carpal tunnel syndrome   . COVID-19 01/15/2020  . Depression   . Fibroids   . GERD (gastroesophageal reflux disease)   . Headache(784.0)   . Headache(784.0)   . Heavy periods   . Hyperthyroidism    right portion of thyroid removed  . Muscle spasm   . Neurological disorder    evaluation for ms  . Painful menstrual periods   . Shortness of breath    when i smoke a lot    Past Surgical History:  Procedure Laterality Date  . BREAST BIOPSY Left    neg  . carpel tunnel Left 2017  . CESAREAN SECTION     times 2  . CHOLECYSTECTOMY    . THYROID LOBECTOMY      Family History  Problem Relation Age of Onset  . Osteoporosis Mother   . Diabetes Father   . Anxiety disorder Father   . Depression Father   . Post-traumatic stress disorder Father   . Heart disease Father   . Anxiety disorder Sister   . Alcohol abuse Brother   . Breast cancer Neg Hx   . Colon cancer Neg Hx    . Ovarian cancer Neg Hx     Social History   Socioeconomic History  . Marital status: Legally Separated    Spouse name: Not on file  . Number of children: Not on file  . Years of education: Not on file  . Highest education level: Not on file  Occupational History  . Not on file  Tobacco Use  . Smoking status: Current Every Day Smoker    Packs/day: 0.50    Years: 25.00    Pack years: 12.50    Types: Cigarettes    Start date: 05/22/1986  . Smokeless tobacco: Never Used  Vaping Use  . Vaping Use: Never used  Substance and Sexual Activity  . Alcohol use: No    Alcohol/week: 0.0 standard drinks  . Drug use: No  . Sexual activity: Yes    Partners: Male    Birth control/protection: Surgical, Injection  Other Topics Concern  . Not on file  Social History Narrative  . Not on file   Social Determinants of Health   Financial Resource Strain:   . Difficulty of Paying Living Expenses: Not on file  Food Insecurity:   .  Worried About Charity fundraiser in the Last Year: Not on file  . Ran Out of Food in the Last Year: Not on file  Transportation Needs:   . Lack of Transportation (Medical): Not on file  . Lack of Transportation (Non-Medical): Not on file  Physical Activity:   . Days of Exercise per Week: Not on file  . Minutes of Exercise per Session: Not on file  Stress:   . Feeling of Stress : Not on file  Social Connections:   . Frequency of Communication with Friends and Family: Not on file  . Frequency of Social Gatherings with Friends and Family: Not on file  . Attends Religious Services: Not on file  . Active Member of Clubs or Organizations: Not on file  . Attends Archivist Meetings: Not on file  . Marital Status: Not on file  Intimate Partner Violence:   . Fear of Current or Ex-Partner: Not on file  . Emotionally Abused: Not on file  . Physically Abused: Not on file  . Sexually Abused: Not on file     Current Outpatient Medications:  .   albuterol (VENTOLIN HFA) 108 (90 Base) MCG/ACT inhaler, INHALE 1 TO 2 PUFFS DAILY, Disp: 8 g, Rfl: 5 .  BREO ELLIPTA 100-25 MCG/INH AEPB, Inhale 1 puff into the lungs daily., Disp: 28 each, Rfl: 4 .  buPROPion (WELLBUTRIN SR) 100 MG 12 hr tablet, Take 1 tablet (100 mg total) by mouth 2 (two) times daily., Disp: 180 tablet, Rfl: 0 .  celecoxib (CELEBREX) 100 MG capsule, Take 1 capsule (100 mg total) by mouth in the morning and at bedtime for 14 days., Disp: 28 capsule, Rfl: 1 .  cyclobenzaprine (FLEXERIL) 10 MG tablet, Take 10 mg by mouth 3 (three) times daily as needed for muscle spasms., Disp: , Rfl:  .  DULoxetine (CYMBALTA) 60 MG capsule, Take 1 capsule (60 mg total) by mouth 2 (two) times daily., Disp: 180 capsule, Rfl: 0 .  ergocalciferol (VITAMIN D2) 1.25 MG (50000 UT) capsule, Take by mouth., Disp: , Rfl:  .  fluticasone (FLONASE) 50 MCG/ACT nasal spray, Place 1 spray into both nostrils daily., Disp: , Rfl: 4 .  furosemide (LASIX) 20 MG tablet, TAKE 1 TABLET BY MOUTH TWICE A WEEK, Disp: , Rfl: 5 .  gabapentin (NEURONTIN) 600 MG tablet, Take 1 tablet (600 mg total) by mouth 2 (two) times daily., Disp: 180 tablet, Rfl: 1 .  medroxyPROGESTERone (DEPO-PROVERA) 150 MG/ML injection, , Disp: , Rfl:  .  medroxyPROGESTERone Acetate 150 MG/ML SUSY, INJECT 1 ML (150 MG TOTAL) INTO THE MUSCLE EVERY 3 (THREE) MONTHS., Disp: 1 mL, Rfl: 3 .  meloxicam (MOBIC) 7.5 MG tablet, Take 1 tablet (7.5 mg total) by mouth daily., Disp: 30 tablet, Rfl: 3 .  omeprazole (PRILOSEC) 40 MG capsule, Take 1 capsule (40 mg total) by mouth daily., Disp: 90 capsule, Rfl: 3 .  propranolol (INDERAL) 10 MG tablet, TAKE 1 TABLET BY MOUTH THREE TIMES A DAY AS NEEDED, Disp: 270 tablet, Rfl: 1 .  rosuvastatin (CRESTOR) 20 MG tablet, Take 20 mg by mouth daily., Disp: , Rfl:  .  topiramate (TOPAMAX) 100 MG tablet, Take 1.5 tablets (150 mg total) by mouth daily. 150 MG AM AND 200 MG PM ,BEING PRESCRIBED PER NEUROLOGY, Disp: 135 tablet,  Rfl: 1 .  Ubrogepant 100 MG TABS, Take by mouth., Disp: , Rfl:  .  zolpidem (AMBIEN) 10 MG tablet, Take 1 tablet (10 mg total) by mouth at bedtime  as needed for sleep., Disp: 15 tablet, Rfl: 1   Allergies  Allergen Reactions  . Azithromycin Other (See Comments)    headache  . Codeine Other (See Comments)    unknown  . Penicillins Other (See Comments)    headaches    ROS Review of Systems  Constitutional: Negative.   HENT: Negative.   Eyes: Negative.   Respiratory: Negative.   Cardiovascular: Negative.   Gastrointestinal: Negative.   Endocrine: Negative.   Genitourinary: Negative.   Musculoskeletal: Positive for joint swelling.       Patient is hurting in the both knees.  Pain is moderately severe grade 7 x 10 she has a hard time walking and bearing weight.  Skin: Negative.   Allergic/Immunologic: Negative.   Neurological: Negative.   Hematological: Negative.   Psychiatric/Behavioral: Negative.   All other systems reviewed and are negative.     Objective:    Physical Exam Vitals reviewed.  Constitutional:      Appearance: Normal appearance.  HENT:     Mouth/Throat:     Mouth: Mucous membranes are moist.  Eyes:     Pupils: Pupils are equal, round, and reactive to light.  Neck:     Vascular: No carotid bruit.  Cardiovascular:     Rate and Rhythm: Normal rate and regular rhythm.     Pulses: Normal pulses.     Heart sounds: Normal heart sounds.  Pulmonary:     Effort: Pulmonary effort is normal.     Breath sounds: Normal breath sounds.  Abdominal:     General: Bowel sounds are normal.     Palpations: Abdomen is soft. There is no hepatomegaly, splenomegaly or mass.     Tenderness: There is no abdominal tenderness.     Hernia: No hernia is present.  Musculoskeletal:        General: No tenderness.     Cervical back: Neck supple.     Right lower leg: No edema.     Left lower leg: No edema.       Legs:     Comments: Pain both knees  Skin:    Findings: No  rash.  Neurological:     Mental Status: She is alert and oriented to person, place, and time.     Motor: No weakness.  Psychiatric:        Mood and Affect: Mood and affect normal.        Behavior: Behavior normal.     BP 126/71   Pulse (!) 105   Ht 5' 6.5" (1.689 m)   Wt 267 lb 9.6 oz (121.4 kg)   BMI 42.54 kg/m  Wt Readings from Last 3 Encounters:  10/23/20 267 lb 9.6 oz (121.4 kg)  08/10/20 267 lb (121.1 kg)  07/10/20 258 lb (117 kg)     Health Maintenance Due  Topic Date Due  . HEMOGLOBIN A1C  Never done  . Hepatitis C Screening  Never done  . PNEUMOCOCCAL POLYSACCHARIDE VACCINE AGE 28-64 HIGH RISK  Never done  . FOOT EXAM  Never done  . OPHTHALMOLOGY EXAM  Never done  . COVID-19 Vaccine (1) Never done  . HIV Screening  Never done  . INFLUENZA VACCINE  07/30/2020  . PAP SMEAR-Modifier  10/30/2020    There are no preventive care reminders to display for this patient.  No results found for: TSH Lab Results  Component Value Date   WBC 12.8 (H) 05/18/2017   HGB 13.9 05/18/2017   HCT 40.8 05/18/2017  MCV 83.2 05/18/2017   PLT 251 05/18/2017   Lab Results  Component Value Date   NA 138 05/18/2017   K 4.5 05/18/2017   CO2 23 05/18/2017   GLUCOSE 110 (H) 05/18/2017   BUN 15 05/18/2017   CREATININE 0.85 05/18/2017   BILITOT 0.7 05/18/2017   ALKPHOS 66 05/18/2017   AST 24 05/18/2017   ALT 18 05/18/2017   PROT 7.3 05/18/2017   ALBUMIN 3.7 05/18/2017   CALCIUM 9.2 05/18/2017   ANIONGAP 7 05/18/2017   No results found for: CHOL No results found for: HDL No results found for: LDLCALC No results found for: TRIG No results found for: CHOLHDL No results found for: HGBA1C    Assessment & Plan:   Problem List Items Addressed This Visit      Cardiovascular and Mediastinum   Essential hypertension    Blood pressure is stable on the present medication.        Endocrine   Other specified diabetes mellitus with other specified complication ([place]) - Primary     Patient blood sugar is 160 today she was advised to lose weight.  She will come back for a complete panel and hemoglobin AIC.  She says next blood sugar was done after eating.      Relevant Orders   POCT glucose (manual entry) (Completed)     Other   Anxiety    - Patient experiencing high levels of anxiety.  - Encouraged patient to engage in relaxing activities like yoga, meditation, journaling, going for a walk, or participating in a hobby.  - Encouraged patient to reach out to trusted friends or family members about recent struggles       Adiposity    - I encouraged the patient to lose weight.  - I educated them on making healthy dietary choices including eating more fruits and vegetables and less fried foods. - I encouraged the patient to exercise more, and educated on the benefits of exercise including weight loss, diabetes prevention, and hypertension prevention.        MDD (major depressive disorder), recurrent, in full remission ([place])    Patient is under the care of a psychiatrist.  She is not suicidal.         Meds ordered this encounter  Medications  . celecoxib (CELEBREX) 100 MG capsule    Sig: Take 1 capsule (100 mg total) by mouth in the morning and at bedtime for 14 days.    Dispense:  28 capsule    Refill:  1    Follow-up: 2 wks   Cletis Athens, MD

## 2020-10-23 NOTE — Assessment & Plan Note (Signed)
Patient blood sugar is 160 today she was advised to lose weight.  She will come back for a complete panel and hemoglobin AIC.  She says next blood sugar was done after eating.

## 2020-10-23 NOTE — Assessment & Plan Note (Signed)
-   Patient experiencing high levels of anxiety.  - Encouraged patient to engage in relaxing activities like yoga, meditation, journaling, going for a walk, or participating in a hobby.  - Encouraged patient to reach out to trusted friends or family members about recent struggles 

## 2020-10-23 NOTE — Assessment & Plan Note (Signed)
Blood pressure is stable on the present medication 

## 2020-10-23 NOTE — Assessment & Plan Note (Signed)
-   I encouraged the patient to lose weight.  - I educated them on making healthy dietary choices including eating more fruits and vegetables and less fried foods. - I encouraged the patient to exercise more, and educated on the benefits of exercise including weight loss, diabetes prevention, and hypertension prevention.   

## 2020-10-24 NOTE — Addendum Note (Signed)
Addended by: Alois Cliche on: 10/24/2020 09:35 AM   Modules accepted: Orders

## 2020-10-31 ENCOUNTER — Ambulatory Visit: Payer: Medicare HMO

## 2020-11-03 ENCOUNTER — Ambulatory Visit (INDEPENDENT_AMBULATORY_CARE_PROVIDER_SITE_OTHER): Payer: Medicare HMO | Admitting: Licensed Clinical Social Worker

## 2020-11-03 ENCOUNTER — Other Ambulatory Visit: Payer: Self-pay

## 2020-11-03 ENCOUNTER — Encounter: Payer: Self-pay | Admitting: Licensed Clinical Social Worker

## 2020-11-03 DIAGNOSIS — F331 Major depressive disorder, recurrent, moderate: Secondary | ICD-10-CM | POA: Diagnosis not present

## 2020-11-03 DIAGNOSIS — F410 Panic disorder [episodic paroxysmal anxiety] without agoraphobia: Secondary | ICD-10-CM

## 2020-11-03 DIAGNOSIS — F4000 Agoraphobia, unspecified: Secondary | ICD-10-CM | POA: Diagnosis not present

## 2020-11-03 NOTE — Progress Notes (Signed)
Virtual Visit via Video Note  I connected with Mary Koch on 11/03/20 at 11:00 AM EDT by a video enabled telemedicine application and verified that I am speaking with the correct person using two identifiers.  Location: Patient: Home  Provider: Home Office   I discussed the limitations of evaluation and management by telemedicine and the availability of in person appointments. The patient expressed understanding and agreed to proceed.  THERAPY PROGRESS NOTE  Session Time: 30 Minutes  Participation Level: Active  Behavioral Response: CasualAlertAnxious and Depressed  Type of Therapy: Individual Therapy  Treatment Goals addressed: Anxiety and Coping  Interventions: CBT  Summary: Mary Koch is a 49 y.o. female who presents with anxiety and depression sxs. Pt reported no changes in sxs since last session. Pt reported she continues to stay at home taking care of pets and children. Pt reported she has plans to spend time with family on Thanksgiving and has a cordial relationship with children's father. Pt reported "we co-parent and agree to get along and spend holidays together for the kids". Pt engaged in relaxation and mindfulness exercises.   Suicidal/Homicidal: No   Therapist Response: Therapist met with patient for follow up session. Therapist and patient explored sxs and attempts to cope. Therapist provided psychoeducation on somatic sxs of anxiety and how relaxation techniques can be used to decrease pain and stress in the body. Therapist led patient in diaphragmatic breathing technique and self-soothing with the 5 senses. Pt was receptive. Therapist assigned patient homework to practice using these skills several times between now and next session.  Plan: Return again in 2 weeks.  Diagnosis: Axis I: Panic Disorder and Agoraphobia, MDD, Recurent, Moderate    Axis II: N/A  Josephine Igo, LCSW, LCAS 11/03/2020

## 2020-11-07 ENCOUNTER — Ambulatory Visit
Admission: RE | Admit: 2020-11-07 | Discharge: 2020-11-07 | Disposition: A | Discharge status: Home / Self Care | Payer: Medicare HMO | Source: Ambulatory Visit | Attending: Certified Nurse Midwife | Admitting: Certified Nurse Midwife

## 2020-11-07 ENCOUNTER — Other Ambulatory Visit: Payer: Self-pay

## 2020-11-07 DIAGNOSIS — Z3042 Encounter for surveillance of injectable contraceptive: Secondary | ICD-10-CM | POA: Insufficient documentation

## 2020-11-07 DIAGNOSIS — Z1231 Encounter for screening mammogram for malignant neoplasm of breast: Secondary | ICD-10-CM | POA: Diagnosis not present

## 2020-11-07 DIAGNOSIS — Z793 Long term (current) use of hormonal contraceptives: Secondary | ICD-10-CM | POA: Insufficient documentation

## 2020-11-07 DIAGNOSIS — Z01419 Encounter for gynecological examination (general) (routine) without abnormal findings: Secondary | ICD-10-CM | POA: Insufficient documentation

## 2020-11-07 DIAGNOSIS — Z6841 Body Mass Index (BMI) 40.0 and over, adult: Secondary | ICD-10-CM | POA: Diagnosis not present

## 2020-11-07 DIAGNOSIS — Z13820 Encounter for screening for osteoporosis: Secondary | ICD-10-CM | POA: Diagnosis not present

## 2020-11-10 ENCOUNTER — Ambulatory Visit (INDEPENDENT_AMBULATORY_CARE_PROVIDER_SITE_OTHER): Payer: Medicare HMO

## 2020-11-10 ENCOUNTER — Other Ambulatory Visit: Payer: Self-pay

## 2020-11-10 DIAGNOSIS — Z3042 Encounter for surveillance of injectable contraceptive: Secondary | ICD-10-CM | POA: Diagnosis not present

## 2020-11-10 MED ORDER — MEDROXYPROGESTERONE ACETATE 150 MG/ML IM SUSP
150.0000 mg | Freq: Once | INTRAMUSCULAR | Status: AC
Start: 2020-11-10 — End: 2020-11-10
  Administered 2020-11-10: 150 mg via INTRAMUSCULAR

## 2020-11-10 NOTE — Progress Notes (Signed)
Last depo inj: 08/10/20 UPT: N/A Side effects: none Next Depo- Provera injection due: 01/26/21-02/09/21 Annual due: 08/10/21

## 2020-11-13 ENCOUNTER — Telehealth (INDEPENDENT_AMBULATORY_CARE_PROVIDER_SITE_OTHER): Payer: Medicare HMO | Admitting: Psychiatry

## 2020-11-13 ENCOUNTER — Encounter: Payer: Self-pay | Admitting: Psychiatry

## 2020-11-13 ENCOUNTER — Other Ambulatory Visit: Payer: Self-pay

## 2020-11-13 DIAGNOSIS — F1590 Other stimulant use, unspecified, uncomplicated: Secondary | ICD-10-CM

## 2020-11-13 DIAGNOSIS — F4000 Agoraphobia, unspecified: Secondary | ICD-10-CM

## 2020-11-13 DIAGNOSIS — F410 Panic disorder [episodic paroxysmal anxiety] without agoraphobia: Secondary | ICD-10-CM

## 2020-11-13 DIAGNOSIS — F5105 Insomnia due to other mental disorder: Secondary | ICD-10-CM | POA: Diagnosis not present

## 2020-11-13 DIAGNOSIS — F17200 Nicotine dependence, unspecified, uncomplicated: Secondary | ICD-10-CM | POA: Diagnosis not present

## 2020-11-13 DIAGNOSIS — F99 Mental disorder, not otherwise specified: Secondary | ICD-10-CM

## 2020-11-13 DIAGNOSIS — F331 Major depressive disorder, recurrent, moderate: Secondary | ICD-10-CM | POA: Insufficient documentation

## 2020-11-13 DIAGNOSIS — F3342 Major depressive disorder, recurrent, in full remission: Secondary | ICD-10-CM | POA: Diagnosis not present

## 2020-11-13 MED ORDER — BELSOMRA 10 MG PO TABS: 10.0000 mg | ORAL_TABLET | Freq: Every day | ORAL | 1 refills | Status: DC

## 2020-11-13 NOTE — Progress Notes (Signed)
Virtual Visit via Video Note  I connected with Mary Koch on 11/13/20 at  4:00 PM EST by a video enabled telemedicine application and verified that I am speaking with the correct person using two identifiers.  Location Provider Location : [facility] Patient Location : Home  Participants: Patient , Provider   I discussed the limitations of evaluation and management by telemedicine and the availability of in person appointments. The patient expressed understanding and agreed to proceed.  I discussed the assessment and treatment plan with the patient. The patient was provided an opportunity to ask questions and all were answered. The patient agreed with the plan and demonstrated an understanding of the instructions.  The patient was advised to call back or seek an in-person evaluation if the symptoms worsen or if the condition fails to improve as anticipated.   [facility] MD OP Progress Note  11/13/2020 4:53 PM Mary Koch  MRN:  [id number]  Chief Complaint:  Chief Complaint    Follow-up     HPI: Mary Koch is a 49 year old Caucasian female, lives in [place], has a history of MDD, panic disorder, agoraphobia, insomnia, tobacco use disorder, caffeine use disorder was evaluated by telemedicine today.  Patient today reports she continues to struggle with fatigue during the day.  She reports she takes naps during the day and feels sluggish and sleepy.  She has not been sleeping much at night.  She goes to bed late at around 1 AM.  The Ambien does not help every night.  She reports she has 1-2 nights per week when she sleeps okay.  She still has not gotten her sleep study results yet and is waiting for the same.  Patient denies any suicidality, homicidality or perceptual disturbances.  She does feel anxious and worried since she reports a lot of her family members recently got diagnosed with cancer.  She is currently following up with therapist and that definitely helps.  Patient denies any  other concerns at this time.  Visit Diagnosis:    ICD-10-CM   1. MDD (major depressive disorder), recurrent, in full remission ([place])  F33.42   2. Panic disorder  F41.0   3. Agoraphobia  F40.00   4. Insomnia due to other mental disorder  F51.05 Suvorexant (BELSOMRA) 10 MG TABS   F99   5. Tobacco use disorder  F17.200   6. 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  Past trials of Paxil, Luvox, Klonopin, Zoloft, Seroquel, Wellbutrin, Ambien  Past Medical History:  Past Medical History:  Diagnosis Date  . Allergic rhinitis   . Anemia   . Anxiety   . Anxiety and depression   . Carpal tunnel syndrome   . COVID-19 01/15/2020  . Depression   . Fibroids   . GERD (gastroesophageal reflux disease)   . Headache(784.0)   . Headache(784.0)   . Heavy periods   . Hyperthyroidism    right portion of thyroid removed  . Muscle spasm   . Neurological disorder    evaluation for ms  . Painful menstrual periods   . Shortness of breath    when i smoke a lot    Past Surgical History:  Procedure Laterality Date  . BREAST BIOPSY Left    neg  . carpel tunnel Left 2017  . CESAREAN SECTION     times 2  . CHOLECYSTECTOMY    . THYROID LOBECTOMY  Family Psychiatric History: I have reviewed family psychiatric history from my progress note on 03/24/2018  Family History:  Family History  Problem Relation Age of Onset  . Osteoporosis Mother   . Breast cancer Mother 18  . Diabetes Father   . Anxiety disorder Father   . Depression Father   . Post-traumatic stress disorder Father   . Heart disease Father   . Anxiety disorder Sister   . Alcohol abuse Brother   . Colon cancer Neg Hx   . Ovarian cancer Neg Hx     Social History: Reviewed social history from my progress note on 03/24/2018 Social History   Socioeconomic History  . Marital status: Legally Separated    Spouse name: Not on file   . Number of children: Not on file  . Years of education: Not on file  . Highest education level: Not on file  Occupational History  . Not on file  Tobacco Use  . Smoking status: Current Every Day Smoker    Packs/day: 0.50    Years: 25.00    Pack years: 12.50    Types: Cigarettes    Start date: 05/22/1986  . Smokeless tobacco: Never Used  Vaping Use  . Vaping Use: Never used  Substance and Sexual Activity  . Alcohol use: No    Alcohol/week: 0.0 standard drinks  . Drug use: No  . Sexual activity: Yes    Partners: Male    Birth control/protection: Surgical, Injection  Other Topics Concern  . Not on file  Social History Narrative  . Not on file   Social Determinants of Health   Financial Resource Strain:   . Difficulty of Paying Living Expenses: Not on file  Food Insecurity:   . Worried About Charity fundraiser in the Last Year: Not on file  . Ran Out of Food in the Last Year: Not on file  Transportation Needs:   . Lack of Transportation (Medical): Not on file  . Lack of Transportation (Non-Medical): Not on file  Physical Activity:   . Days of Exercise per Week: Not on file  . Minutes of Exercise per Session: Not on file  Stress:   . Feeling of Stress : Not on file  Social Connections:   . Frequency of Communication with Friends and Family: Not on file  . Frequency of Social Gatherings with Friends and Family: Not on file  . Attends Religious Services: Not on file  . Active Member of Clubs or Organizations: Not on file  . Attends Archivist Meetings: Not on file  . Marital Status: Not on file    Allergies:  Allergies  Allergen Reactions  . Azithromycin Other (See Comments)    headache  . Codeine Other (See Comments)    unknown  . Penicillins Other (See Comments)    headaches    Metabolic Disorder Labs: No results found for: HGBA1C, MPG No results found for: PROLACTIN No results found for: CHOL, TRIG, HDL, CHOLHDL, VLDL, LDLCALC No results  found for: TSH  Therapeutic Level Labs: No results found for: LITHIUM No results found for: VALPROATE No components found for:  CBMZ  Current Medications: Current Outpatient Medications  Medication Sig Dispense Refill  . albuterol (VENTOLIN HFA) 108 (90 Base) MCG/ACT inhaler INHALE 1 TO 2 PUFFS DAILY 8 g 5  . BREO ELLIPTA 100-25 MCG/INH AEPB Inhale 1 puff into the lungs daily. 28 each 4  . buPROPion (WELLBUTRIN SR) 100 MG 12 hr tablet Take 1 tablet (100  mg total) by mouth 2 (two) times daily. 180 tablet 0  . cyclobenzaprine (FLEXERIL) 10 MG tablet Take 10 mg by mouth 3 (three) times daily as needed for muscle spasms.    . DULoxetine (CYMBALTA) 60 MG capsule Take 1 capsule (60 mg total) by mouth 2 (two) times daily. 180 capsule 0  . ergocalciferol (VITAMIN D2) 1.25 MG (50000 UT) capsule Take by mouth.    . fluticasone (FLONASE) 50 MCG/ACT nasal spray Place 1 spray into both nostrils daily.  4  . furosemide (LASIX) 20 MG tablet TAKE 1 TABLET BY MOUTH TWICE A WEEK  5  . gabapentin (NEURONTIN) 600 MG tablet Take 1 tablet (600 mg total) by mouth 2 (two) times daily. 180 tablet 1  . medroxyPROGESTERone (DEPO-PROVERA) 150 MG/ML injection     . medroxyPROGESTERone Acetate 150 MG/ML SUSY INJECT 1 ML (150 MG TOTAL) INTO THE MUSCLE EVERY 3 (THREE) MONTHS. 1 mL 3  . meloxicam (MOBIC) 7.5 MG tablet Take 1 tablet (7.5 mg total) by mouth daily. 30 tablet 3  . omeprazole (PRILOSEC) 40 MG capsule Take 1 capsule (40 mg total) by mouth daily. 90 capsule 3  . propranolol (INDERAL) 10 MG tablet TAKE 1 TABLET BY MOUTH THREE TIMES A DAY AS NEEDED 270 tablet 1  . rosuvastatin (CRESTOR) 20 MG tablet Take 20 mg by mouth daily.    . Suvorexant (BELSOMRA) 10 MG TABS Take 10 mg by mouth at bedtime. 15 tablet 1  . topiramate (TOPAMAX) 100 MG tablet Take 1.5 tablets (150 mg total) by mouth daily. 150 MG AM AND 200 MG PM ,BEING PRESCRIBED PER NEUROLOGY 135 tablet 1  . Ubrogepant 100 MG TABS Take by mouth.     No  current facility-administered medications for this visit.     Musculoskeletal: Strength & Muscle Tone: UTA Gait & Station: Seated Patient leans: N/A  Psychiatric Specialty Exam: Review of Systems  Constitutional: Positive for fatigue.  Psychiatric/Behavioral: Positive for sleep disturbance. The patient is nervous/anxious.   All other systems reviewed and are negative.   There were no vitals taken for this visit.There is no height or weight on file to calculate BMI.  General Appearance: Casual  Eye Contact:  Fair  Speech:  Clear and Coherent  Volume:  Normal  Mood:  Anxious  Affect:  Congruent  Thought Process:  Goal Directed and Descriptions of Associations: Intact  Orientation:  Full (Time, Place, and Person)  Thought Content: Logical   Suicidal Thoughts:  No  Homicidal Thoughts:  No  Memory:  Immediate;   Fair Recent;   Fair Remote;   Fair  Judgement:  Fair  Insight:  Fair  Psychomotor Activity:  Normal  Concentration:  Concentration: Fair and Attention Span: Fair  Recall:  [employer] of Knowledge: Fair  Language: Fair  Akathisia:  No  Handed:  Right  AIMS (if indicated): UTA  Assets:  Communication Skills Desire for Improvement Housing Social Support  ADL's:  Intact  Cognition: WNL  Sleep:  Poor   Screenings: AUDIT     Office Visit from 05/22/2016 in [facility]  Alcohol Use Disorder Identification Test Final Score (AUDIT) 0    GAD-7     Counselor from 10/06/2020 in [facility] Office Visit from 08/10/2019 in [facility]  Total GAD-7 Score 15 14    PHQ2-9     Counselor from 10/06/2020 in [facility] Visit from 08/10/2019 in [facility] Visit from 05/22/2016  in [facility]  PHQ-2 Total Score 4 3 4   PHQ-9 Total Score 19 15 18        Assessment and Plan: Mary Koch is a 49 year old Caucasian female with a  history of MDD, panic attacks, tobacco use disorder, caffeine use disorder, insomnia was evaluated by telemedicine today.  Patient is currently struggling with sluggishness, fatigue and sleep problems.  Discussed plan as noted below.  Plan MDD in remission Wellbutrin 100 mg p.o. twice daily.  We will consider increasing the dosage if she continues to have tiredness or fatigue. Cymbalta 60 mg p.o. twice daily Neurontin 600 mg p.o. twice daily   Panic attacks-stable Cymbalta as prescribed Gabapentin 600 mg p.o. twice daily Propanolol 10 mg p.o. 3 times daily as needed  Insomnia-unstable Discussed sleep hygiene techniques.  Discontinue Ambien for lack of benefit. Start Belsomra 10 mg p.o. nightly.  Discussed with patient to go to bed early and not to stay up late. Pending sleep study report.  She will get in touch with her provider. Discussed with patient that a lot of her medications could be contributing to fatigue and sluggishness during the day.  She will talk to her other providers to see if she can be taken off of some of the medications.  Tobacco use disorder-improving Provided counseling  Caffeine use disorder-improving Provided counseling.  I have reviewed TSH in [facility] dated 08/28/2020-within normal limits.  Patient had vitamin D and vitamin B12 deficiency-is currently being replaced.  That could also be contributing to fatigue.  She will benefit from repeat levels.  Follow-up in clinic in 3 to 4 weeks or sooner if needed.  I have spent atleast 20 minutes face to face by video with patient today. More than 50 % of the time was spent for preparing to see the patient ( e.g., review of test, records ), ordering medications and test ,psychoeducation and supportive psychotherapy and care coordination,as well as documenting clinical information in electronic health record. This note was generated in part or whole with voice recognition software. Voice recognition is usually quite  accurate but there are transcription errors that can and very often do occur. I apologize for any typographical errors that were not detected and corrected.        Ursula Alert, MD 11/13/2020, 4:53 PM

## 2020-11-16 ENCOUNTER — Other Ambulatory Visit: Payer: Self-pay

## 2020-11-16 ENCOUNTER — Ambulatory Visit: Payer: Medicare HMO | Admitting: Licensed Clinical Social Worker

## 2020-11-27 DIAGNOSIS — E538 Deficiency of other specified B group vitamins: Secondary | ICD-10-CM | POA: Diagnosis not present

## 2020-11-28 ENCOUNTER — Encounter: Payer: Self-pay | Admitting: Licensed Clinical Social Worker

## 2020-11-28 ENCOUNTER — Other Ambulatory Visit: Payer: Self-pay

## 2020-11-28 ENCOUNTER — Ambulatory Visit (INDEPENDENT_AMBULATORY_CARE_PROVIDER_SITE_OTHER): Payer: Medicare HMO | Admitting: Licensed Clinical Social Worker

## 2020-11-28 DIAGNOSIS — F4000 Agoraphobia, unspecified: Secondary | ICD-10-CM

## 2020-11-28 DIAGNOSIS — F410 Panic disorder [episodic paroxysmal anxiety] without agoraphobia: Secondary | ICD-10-CM | POA: Diagnosis not present

## 2020-11-28 DIAGNOSIS — F331 Major depressive disorder, recurrent, moderate: Secondary | ICD-10-CM | POA: Diagnosis not present

## 2020-11-28 NOTE — Progress Notes (Signed)
Virtual Visit via Video Note  I connected with [name] on 11/28/20 at  9:00 AM EST by a video enabled telemedicine application and verified that I am speaking with the correct person using two identifiers.  Participating Parties Patient Provider  Location: Patient: Home Provider: Home Office   I discussed the limitations of evaluation and management by telemedicine and the availability of in person appointments. The patient expressed understanding and agreed to proceed.  THERAPY PROGRESS NOTE  Session Time: 30 Minutes  Participation Level: Active  Behavioral Response: CasualLethargicAnxious and Depressed  Type of Therapy: Individual Therapy  Treatment Goals addressed: Anxiety and Coping  Interventions: CBT  Summary: Mary Koch is a 49 y.o. female who presents with anxiety and depression sxs. Pt reported no changes in sxs since last session. Pt reported she continues to worry about health of family members and spent time with them for Thanksgiving. Pt reported no other concerns and was open to continuing to learn and practice mindfulness and relaxation exercises.   Suicidal/Homicidal: No  Therapist Response: Therapist met with patient for follow up session. Therapist and patient reviewed sxs and discussion of skills learned last session. Therapist provided psychoeducation around observing thoughts and the emotional attachments without reacting or judging as if watching on a movie screen. Therapist then described how to utilize progressive muscle relaxation for tension in the body. Therapist assigned patient homework to attempt to practice at least one or more of these exercises between now and next session.  Plan: Return again in 1 week.  Diagnosis: Axis I: Panic Disorder and Agoraphobia, MDD, Recurrent, Moderate    Axis II: N/A  Josephine Igo, LCSW, LCAS 11/28/2020

## 2020-12-08 ENCOUNTER — Encounter: Payer: Self-pay | Admitting: Licensed Clinical Social Worker

## 2020-12-08 ENCOUNTER — Other Ambulatory Visit: Payer: Self-pay

## 2020-12-08 ENCOUNTER — Ambulatory Visit (INDEPENDENT_AMBULATORY_CARE_PROVIDER_SITE_OTHER): Payer: Medicare HMO | Admitting: Licensed Clinical Social Worker

## 2020-12-08 DIAGNOSIS — F4000 Agoraphobia, unspecified: Secondary | ICD-10-CM | POA: Diagnosis not present

## 2020-12-08 DIAGNOSIS — F410 Panic disorder [episodic paroxysmal anxiety] without agoraphobia: Secondary | ICD-10-CM | POA: Diagnosis not present

## 2020-12-08 DIAGNOSIS — F331 Major depressive disorder, recurrent, moderate: Secondary | ICD-10-CM

## 2020-12-08 NOTE — Progress Notes (Signed)
Virtual Visit via Video Note  I connected with [name] on 12/08/20 at 10:00 AM EST by a video enabled telemedicine application and verified that I am speaking with the correct person using two identifiers.  Participating Parties Patient Provider  Location: Patient: Home Provider: Home Office   I discussed the limitations of evaluation and management by telemedicine and the availability of in person appointments. The patient expressed understanding and agreed to proceed.  THERAPY PROGRESS NOTE  Session Time: 30 Minutes  Participation Level: Active  Behavioral Response: CasualAlertAnxious and Depressed  Type of Therapy: Individual Therapy  Treatment Goals addressed: Anxiety and Coping  Interventions: CBT  Summary: Mary Koch is a 49 y.o. female who presents with anxiety and depression sxs. Pt reported no changes in sxs since last session. Pt reported she continues to utilize breathing techniques and finds them helpful at times depending on the circumstances and her anxiety level/rating. Pt was receptive to learning and discussing more mindfulness and relaxation skills to practice between now and next session. Suicidal/Homicidal: No  Therapist Response: Therapist met with patient for follow up session. Therapist and patient reviewed homework assignment. Therapist and patient explored what is and is not working with implementation of coping skills. Therapist provided psychoeducation on using sensory experiences to produce calming/relaxing effect. Therapist and patient discussed how patient could utilize this coping skill in daily life.  Plan: Return again in 1 month.  Diagnosis: Axis I: Panic Disorder and Agoraphobia, MDD, Recurrent, Moderate    Axis II: N/A  Josephine Igo, LCSW, LCAS 12/08/2020

## 2020-12-11 ENCOUNTER — Encounter: Payer: Self-pay | Admitting: Psychiatry

## 2020-12-11 ENCOUNTER — Other Ambulatory Visit: Payer: Self-pay

## 2020-12-11 ENCOUNTER — Telehealth (INDEPENDENT_AMBULATORY_CARE_PROVIDER_SITE_OTHER): Payer: Medicare HMO | Admitting: Psychiatry

## 2020-12-11 DIAGNOSIS — F4000 Agoraphobia, unspecified: Secondary | ICD-10-CM

## 2020-12-11 DIAGNOSIS — F5105 Insomnia due to other mental disorder: Secondary | ICD-10-CM | POA: Diagnosis not present

## 2020-12-11 DIAGNOSIS — F17200 Nicotine dependence, unspecified, uncomplicated: Secondary | ICD-10-CM | POA: Diagnosis not present

## 2020-12-11 DIAGNOSIS — F3342 Major depressive disorder, recurrent, in full remission: Secondary | ICD-10-CM | POA: Diagnosis not present

## 2020-12-11 DIAGNOSIS — F99 Mental disorder, not otherwise specified: Secondary | ICD-10-CM

## 2020-12-11 DIAGNOSIS — F410 Panic disorder [episodic paroxysmal anxiety] without agoraphobia: Secondary | ICD-10-CM | POA: Diagnosis not present

## 2020-12-11 DIAGNOSIS — F1590 Other stimulant use, unspecified, uncomplicated: Secondary | ICD-10-CM | POA: Diagnosis not present

## 2020-12-11 MED ORDER — BELSOMRA 10 MG PO TABS: 10.0000 mg | ORAL_TABLET | Freq: Every day | ORAL | 1 refills | Status: DC

## 2020-12-11 MED ORDER — BUPROPION HCL ER (SR) 100 MG PO TB12: 100.0000 mg | ORAL_TABLET | Freq: Two times a day (BID) | ORAL | 0 refills | Status: DC

## 2020-12-11 NOTE — Progress Notes (Signed)
Virtual Visit via Video Note  I connected with Mary Koch on 12/11/20 at  9:40 AM EST by a video enabled telemedicine application and verified that I am speaking with the correct person using two identifiers.  Location Provider Location : [facility] Patient Location : Home  Participants: Patient , Provider   I discussed the limitations of evaluation and management by telemedicine and the availability of in person appointments. The patient expressed understanding and agreed to proceed.    I discussed the assessment and treatment plan with the patient. The patient was provided an opportunity to ask questions and all were answered. The patient agreed with the plan and demonstrated an understanding of the instructions.   The patient was advised to call back or seek an in-person evaluation if the symptoms worsen or if the condition fails to improve as anticipated.   Mary Koch OP Progress Note  12/11/2020 10:00 AM Mary Koch  MRN:  [id number]  Chief Complaint:  Chief Complaint    Follow-up     HPI: Mary Koch is a 49 year old Caucasian female lives in [place], has a history of MDD, panic disorder, agoraphobia, insomnia, tobacco use disorder, caffeine use disorder was evaluated by telemedicine today.  Patient today reports her sleep has improved however she continues to wake up couple of times at night. She however is able to fall back asleep. She is taking Belsomra 10 mg and denies side effects. She however reports she wants to stay on this dosage and is not interested in dosage increase today. She reports she continues to await her sleep study results which is still pending.  She reports she does have episodes of depressive symptoms on and off however the last time she had her depressive episode was over a month ago. So far she has been coping okay.  She reports her anxiety symptoms are more under control.  She denies any suicidality, homicidality or perceptual disturbances.  She  is currently in psychotherapy sessions and reports therapy sessions with Ms. Zadie Rhine as beneficial.  Patient denies any other concerns today.  Visit Diagnosis:    ICD-10-CM   1. MDD (major depressive disorder), recurrent, in full remission ([place])  F33.42 buPROPion (WELLBUTRIN SR) 100 MG 12 hr tablet  2. Panic disorder  F41.0   3. Agoraphobia  F40.00   4. Insomnia due to other mental disorder  F51.05 Suvorexant (BELSOMRA) 10 MG TABS   F99   5. Tobacco use disorder  F17.200   6. 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 Past trials of Paxil, Luvox, Klonopin, Zoloft, Seroquel, Wellbutrin, Ambien  Past Medical History:  Past Medical History:  Diagnosis Date  . Allergic rhinitis   . Anemia   . Anxiety   . Anxiety and depression   . Carpal tunnel syndrome   . COVID-19 01/15/2020  . Depression   . Fibroids   . GERD (gastroesophageal reflux disease)   . Headache(784.0)   . Headache(784.0)   . Heavy periods   . Hyperthyroidism    right portion of thyroid removed  . Muscle spasm   . Neurological disorder    evaluation for ms  . Painful menstrual periods   . Shortness of breath    when i smoke a lot    Past Surgical History:  Procedure Laterality Date  . BREAST BIOPSY Left    neg  . carpel tunnel Left 2017  .  CESAREAN SECTION     times 2  . CHOLECYSTECTOMY    . THYROID LOBECTOMY      Family Psychiatric History: I have reviewed family psychiatric history from my progress note on 03/24/2018  Family History:  Family History  Problem Relation Age of Onset  . Osteoporosis Mother   . Breast cancer Mother 40  . Diabetes Father   . Anxiety disorder Father   . Depression Father   . Post-traumatic stress disorder Father   . Heart disease Father   . Anxiety disorder Sister   . Alcohol abuse Brother   . Colon cancer Neg Hx   . Ovarian cancer Neg Hx     Social History: I  have reviewed social history from my progress note on 03/24/2018 Social History   Socioeconomic History  . Marital status: Legally Separated    Spouse name: Not on file  . Number of children: Not on file  . Years of education: Not on file  . Highest education level: Not on file  Occupational History  . Not on file  Tobacco Use  . Smoking status: Current Every Day Smoker    Packs/day: 0.50    Years: 25.00    Pack years: 12.50    Types: Cigarettes    Start date: 05/22/1986  . Smokeless tobacco: Never Used  Vaping Use  . Vaping Use: Never used  Substance and Sexual Activity  . Alcohol use: No    Alcohol/week: 0.0 standard drinks  . Drug use: No  . Sexual activity: Yes    Partners: Male    Birth control/protection: Surgical, Injection  Other Topics Concern  . Not on file  Social History Narrative  . Not on file   Social Determinants of Health   Financial Resource Strain: Not on file  Food Insecurity: Not on file  Transportation Needs: Not on file  Physical Activity: Not on file  Stress: Not on file  Social Connections: Not on file    Allergies:  Allergies  Allergen Reactions  . Azithromycin Other (See Comments)    headache  . Codeine Other (See Comments)    unknown  . Penicillins Other (See Comments)    headaches    Metabolic Disorder Labs: No results found for: HGBA1C, MPG No results found for: PROLACTIN No results found for: CHOL, TRIG, HDL, CHOLHDL, VLDL, LDLCALC No results found for: TSH  Therapeutic Level Labs: No results found for: LITHIUM No results found for: VALPROATE No components found for:  CBMZ  Current Medications: Current Outpatient Medications  Medication Sig Dispense Refill  . albuterol (VENTOLIN HFA) 108 (90 Base) MCG/ACT inhaler INHALE 1 TO 2 PUFFS DAILY 8 g 5  . BREO ELLIPTA 100-25 MCG/INH AEPB Inhale 1 puff into the lungs daily. 28 each 4  . buPROPion (WELLBUTRIN SR) 100 MG 12 hr tablet Take 1 tablet (100 mg total) by mouth 2 (two)  times daily. 180 tablet 0  . cyclobenzaprine (FLEXERIL) 10 MG tablet Take 10 mg by mouth 3 (three) times daily as needed for muscle spasms.    . DULoxetine (CYMBALTA) 60 MG capsule Take 1 capsule (60 mg total) by mouth 2 (two) times daily. 180 capsule 0  . furosemide (LASIX) 20 MG tablet TAKE 1 TABLET BY MOUTH TWICE A WEEK  5  . gabapentin (NEURONTIN) 600 MG tablet Take 1 tablet (600 mg total) by mouth 2 (two) times daily. 180 tablet 1  . medroxyPROGESTERone (DEPO-PROVERA) 150 MG/ML injection     . medroxyPROGESTERone Acetate 150  MG/ML SUSY INJECT 1 ML (150 MG TOTAL) INTO THE MUSCLE EVERY 3 (THREE) MONTHS. 1 mL 3  . meloxicam (MOBIC) 7.5 MG tablet Take 1 tablet (7.5 mg total) by mouth daily. 30 tablet 3  . omeprazole (PRILOSEC) 40 MG capsule Take 1 capsule (40 mg total) by mouth daily. 90 capsule 3  . propranolol (INDERAL) 10 MG tablet TAKE 1 TABLET BY MOUTH THREE TIMES A DAY AS NEEDED 270 tablet 1  . rosuvastatin (CRESTOR) 20 MG tablet Take 20 mg by mouth daily.    . Suvorexant (BELSOMRA) 10 MG TABS Take 10 mg by mouth at bedtime. 30 tablet 1  . topiramate (TOPAMAX) 100 MG tablet Take 1.5 tablets (150 mg total) by mouth daily. 150 MG AM AND 200 MG PM ,BEING PRESCRIBED PER NEUROLOGY 135 tablet 1  . Ubrogepant 100 MG TABS Take by mouth.     No current facility-administered medications for this visit.     Musculoskeletal: Strength & Muscle Tone: UTA Gait & Station: UTA Patient leans: N/A  Psychiatric Specialty Exam: Review of Systems  Psychiatric/Behavioral: Positive for sleep disturbance. The patient is nervous/anxious.   All other systems reviewed and are negative.   There were no vitals taken for this visit.There is no height or weight on file to calculate BMI.  General Appearance: Casual  Eye Contact:  Fair  Speech:  Clear and Coherent  Volume:  Normal  Mood:  Anxious Improving  Affect:  Congruent  Thought Process:  Goal Directed and Descriptions of Associations: Intact   Orientation:  Full (Time, Place, and Person)  Thought Content: Logical   Suicidal Thoughts:  No  Homicidal Thoughts:  No  Memory:  Immediate;   Fair Recent;   Fair Remote;   Fair  Judgement:  Fair  Insight:  Fair  Psychomotor Activity:  Normal  Concentration:  Concentration: Fair and Attention Span: Fair  Recall:  [employer] of Knowledge: Fair  Language: Fair  Akathisia:  No  Handed:  Right  AIMS (if indicated): UTA  Assets:  Communication Skills Desire for Improvement Housing Social Support  ADL's:  Intact  Cognition: WNL  Sleep:  Improving   Screenings: AUDIT   Flowsheet Row Office Visit from 05/22/2016 in [facility]  Alcohol Use Disorder Identification Test Final Score (AUDIT) 0    GAD-7   Flowsheet Row Counselor from 10/06/2020 in [facility] Office Visit from 08/10/2019 in [facility]  Total GAD-7 Score 15 14    PHQ2-9   Flowsheet Row Counselor from 10/06/2020 in [facility] Office Visit from 08/10/2019 in [facility] Visit from 05/22/2016 in [facility]  PHQ-2 Total Score 4 3 4   PHQ-9 Total Score 19 15 18        Assessment and Plan: Tamiya Colello is a 49 year old Caucasian female who has a history of MDD, panic attacks, tobacco use disorder, caffeine use disorder, insomnia was evaluated by telemedicine today. Patient is currently making some progress on the current medication regimen. Plan as noted below.  Plan MDD in remission Wellbutrin 100 mg p.o. twice daily Cymbalta 60 mg p.o. twice daily Neurontin 600 mg p.o. twice daily  Panic attacks-stable Cymbalta as prescribed Gabapentin 600 mg p.o. twice daily Propranolol 10 mg p.o. 3 times daily as needed   Insomnia-improving Belsomra 10 mg p.o. nightly Pending sleep study report.  Tobacco use disorder-improving Provided counseling.  Caffeine use  disorder-improving Provided counseling  Continue CBT with Ms. Ander Purpura  Pernell Dupre.  Follow-up in clinic in 4 weeks or sooner if needed.  I have spent atleast 20 minutes face to face by video with patient today. More than 50 % of the time was spent for preparing to see the patient ( e.g., review of test, records ), ordering medications and test ,psychoeducation and supportive psychotherapy and care coordination,as well as documenting clinical information in electronic health record. This note was generated in part or whole with voice recognition software. Voice recognition is usually quite accurate but there are transcription errors that can and very often do occur. I apologize for any typographical errors that were not detected and corrected.      Ursula Alert, Koch 12/11/2020, 10:00 AM

## 2020-12-13 ENCOUNTER — Other Ambulatory Visit: Payer: Self-pay | Admitting: Internal Medicine

## 2020-12-29 ENCOUNTER — Other Ambulatory Visit: Payer: Self-pay | Admitting: Internal Medicine

## 2020-12-29 DIAGNOSIS — K2100 Gastro-esophageal reflux disease with esophagitis, without bleeding: Secondary | ICD-10-CM

## 2021-01-01 DIAGNOSIS — R55 Syncope and collapse: Secondary | ICD-10-CM | POA: Diagnosis not present

## 2021-01-05 ENCOUNTER — Other Ambulatory Visit: Payer: Self-pay

## 2021-01-05 ENCOUNTER — Ambulatory Visit (INDEPENDENT_AMBULATORY_CARE_PROVIDER_SITE_OTHER): Payer: Medicare HMO | Admitting: Licensed Clinical Social Worker

## 2021-01-05 ENCOUNTER — Encounter: Payer: Self-pay | Admitting: Licensed Clinical Social Worker

## 2021-01-05 DIAGNOSIS — F331 Major depressive disorder, recurrent, moderate: Secondary | ICD-10-CM

## 2021-01-05 DIAGNOSIS — F4000 Agoraphobia, unspecified: Secondary | ICD-10-CM

## 2021-01-05 DIAGNOSIS — F410 Panic disorder [episodic paroxysmal anxiety] without agoraphobia: Secondary | ICD-10-CM

## 2021-01-05 NOTE — Progress Notes (Signed)
Virtual Visit via Video Note  I connected with Mary Koch on 01/05/21 at 11:00 AM EST by a video enabled telemedicine application and verified that I am speaking with the correct person using two identifiers.  Participating Parties Patient Provider  Location: Patient: Home Provider: Home Office   I discussed the limitations of evaluation and management by telemedicine and the availability of in person appointments. The patient expressed understanding and agreed to proceed.  THERAPY PROGRESS NOTE  Session Time: 30 Minutes  Participation Level: Active  Behavioral Response: CasualAlertAnxious  Type of Therapy: Individual Therapy  Treatment Goals addressed: Anxiety and Coping  Interventions: CBT  Summary: Mary Koch is a 49 y.o. female who presents with anxiety and depression sxs. Pt reported she is doing "okay" and that her children enjoyed Christmas. Pt identified current dilemma around moving in to care for her ill mother or staying on her ex-husband's property who is going in for testing due to suspected colon cancer. Pt reported "I worry what people will think if I leave". Pt reflected on this a bit more and acknowledged that she does not owe her ex-husband anything "we haven't been together for the past 12 years" and most likely people would be understanding of her situation. Pt reported she wants to keep a positive relationship with ex for sake of kids having father still in their lives. Pt reported she is also concerned about her own health issues and described having "black outs" coupled with falling. Pt reported she was referred to see a neurologist for testing towards the end of the month and is doing her best not to dwell on it or try to diagnose self online.   Suicidal/Homicidal: No  Therapist Response: Therapist met with patient for follow up session. Therapist and patient explored thoughts, feelings and reactions to current stressors and use of coping  skills/challenging unhelpful thoughts. Therapist validated patient feelings/concerns. Pt was receptive. Plan: Return again in 2 weeks.  Diagnosis: Axis I: Panic Disorder and Agoraphobia, MDD, Recurrent, Moderate    Axis II: N/A   P O'Reilly, LCSW, LCAS 01/05/2021   

## 2021-01-10 ENCOUNTER — Other Ambulatory Visit: Payer: Self-pay

## 2021-01-10 ENCOUNTER — Telehealth (INDEPENDENT_AMBULATORY_CARE_PROVIDER_SITE_OTHER): Payer: Medicare HMO | Admitting: Psychiatry

## 2021-01-10 ENCOUNTER — Encounter: Payer: Self-pay | Admitting: Psychiatry

## 2021-01-10 DIAGNOSIS — F99 Mental disorder, not otherwise specified: Secondary | ICD-10-CM

## 2021-01-10 DIAGNOSIS — F4000 Agoraphobia, unspecified: Secondary | ICD-10-CM

## 2021-01-10 DIAGNOSIS — F1590 Other stimulant use, unspecified, uncomplicated: Secondary | ICD-10-CM | POA: Diagnosis not present

## 2021-01-10 DIAGNOSIS — F3342 Major depressive disorder, recurrent, in full remission: Secondary | ICD-10-CM

## 2021-01-10 DIAGNOSIS — F17200 Nicotine dependence, unspecified, uncomplicated: Secondary | ICD-10-CM

## 2021-01-10 DIAGNOSIS — F410 Panic disorder [episodic paroxysmal anxiety] without agoraphobia: Secondary | ICD-10-CM

## 2021-01-10 DIAGNOSIS — F5105 Insomnia due to other mental disorder: Secondary | ICD-10-CM

## 2021-01-10 MED ORDER — DULOXETINE HCL 60 MG PO CPEP: 60.0000 mg | ORAL_CAPSULE | Freq: Two times a day (BID) | ORAL | 0 refills | Status: DC

## 2021-01-10 MED ORDER — NICOTINE POLACRILEX 4 MG MT GUM: 4.0000 mg | CHEWING_GUM | OROMUCOSAL | 0 refills | Status: DC | PRN

## 2021-01-10 NOTE — Progress Notes (Signed)
Virtual Visit via Video Note  I connected with Mary Koch on 01/10/21 at  9:00 AM EST by a video enabled telemedicine application and verified that I am speaking with the correct person using two identifiers.  Location Provider Location : [facility] Patient Location : Home  Participants: Patient , Provider    I discussed the limitations of evaluation and management by telemedicine and the availability of in person appointments. The patient expressed understanding and agreed to proceed.   I discussed the assessment and treatment plan with the patient. The patient was provided an opportunity to ask questions and all were answered. The patient agreed with the plan and demonstrated an understanding of the instructions.   The patient was advised to call back or seek an in-person evaluation if the symptoms worsen or if the condition fails to improve as anticipated. [place] MD OP Progress Note  01/10/2021 9:13 AM [place]  MRN:  [id number]  Chief Complaint:  Chief Complaint    Follow-up     HPI: Mary Koch is a 50 year old Caucasian female, lives in [place], has a history of MDD, panic disorder, agoraphobia, insomnia, tobacco use disorder, caffeine use disorder was evaluated by telemedicine today.  Patient today reports she is currently struggling with several psychosocial stressors.  She reports her brother was diagnosed with stage III lung cancer, her mother has breast cancer and her son's father may have colon cancer.  She reports all this worries a lot.  She however reports the therapy sessions is helpful.  Patient denies any suicidality, homicidality or perceptual disturbances.  Patient is compliant on medications.  She reports sleep is overall okay.  She is currently waiting for sleep study results.  She had recent appointment with neurologist.  She reports she was asked to get an EEG of her brain.  She continues to smoke cigarettes and reports due to her recent anxiety her  smoking has worsened.  She however is willing to cut back.  Discussed nicotine replacement and also provided resources.  Patient denies any other concerns today.  Visit Diagnosis:    ICD-10-CM   1. MDD (major depressive disorder), recurrent, in full remission ([place])  F33.42   2. Panic disorder  F41.0 DULoxetine (CYMBALTA) 60 MG capsule  3. Agoraphobia  F40.00   4. Insomnia due to other mental disorder  F51.05    F99   5. Tobacco use disorder  F17.200 nicotine polacrilex (NICORETTE) 4 MG gum  6. 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  Past trials of Paxil, Luvox, Klonopin, Zoloft, Seroquel, Wellbutrin, Ambien  Past Medical History:  Past Medical History:  Diagnosis Date  . Allergic rhinitis   . Anemia   . Anxiety   . Anxiety and depression   . Carpal tunnel syndrome   . COVID-19 01/15/2020  . Depression   . Fibroids   . GERD (gastroesophageal reflux disease)   . Headache(784.0)   . Headache(784.0)   . Heavy periods   . Hyperthyroidism    right portion of thyroid removed  . Muscle spasm   . Neurological disorder    evaluation for ms  . Painful menstrual periods   . Shortness of breath    when i smoke a lot    Past Surgical History:  Procedure Laterality Date  . BREAST BIOPSY Left    neg  . carpel tunnel Left 2017  . CESAREAN SECTION  times 2  . CHOLECYSTECTOMY    . THYROID LOBECTOMY      Family Psychiatric History: I have reviewed family psychiatric history from my progress note on 03/24/2018  Family History:  Family History  Problem Relation Age of Onset  . Osteoporosis Mother   . Breast cancer Mother 31  . Diabetes Father   . Anxiety disorder Father   . Depression Father   . Post-traumatic stress disorder Father   . Heart disease Father   . Anxiety disorder Sister   . Alcohol abuse Brother   . Colon cancer Neg Hx   . Ovarian cancer Neg Hx     Social  History: Reviewed social history from my progress note on 03/24/2018 Social History   Socioeconomic History  . Marital status: Legally Separated    Spouse name: Not on file  . Number of children: Not on file  . Years of education: Not on file  . Highest education level: Not on file  Occupational History  . Not on file  Tobacco Use  . Smoking status: Current Every Day Smoker    Packs/day: 0.50    Years: 25.00    Pack years: 12.50    Types: Cigarettes    Start date: 05/22/1986  . Smokeless tobacco: Never Used  Vaping Use  . Vaping Use: Never used  Substance and Sexual Activity  . Alcohol use: No    Alcohol/week: 0.0 standard drinks  . Drug use: No  . Sexual activity: Yes    Partners: Male    Birth control/protection: Surgical, Injection  Other Topics Concern  . Not on file  Social History Narrative  . Not on file   Social Determinants of Health   Financial Resource Strain: Not on file  Food Insecurity: Not on file  Transportation Needs: Not on file  Physical Activity: Not on file  Stress: Not on file  Social Connections: Not on file    Allergies:  Allergies  Allergen Reactions  . Azithromycin Other (See Comments)    headache  . Codeine Other (See Comments)    unknown  . Penicillins Other (See Comments)    headaches    Metabolic Disorder Labs: No results found for: HGBA1C, MPG No results found for: PROLACTIN No results found for: CHOL, TRIG, HDL, CHOLHDL, VLDL, LDLCALC No results found for: TSH  Therapeutic Level Labs: No results found for: LITHIUM No results found for: VALPROATE No components found for:  CBMZ  Current Medications: Current Outpatient Medications  Medication Sig Dispense Refill  . LORazepam (ATIVAN) 1 MG tablet Take by mouth.    . nicotine polacrilex (NICORETTE) 4 MG gum Take 1 each (4 mg total) by mouth as needed for smoking cessation. 100 tablet 0  . albuterol (VENTOLIN HFA) 108 (90 Base) MCG/ACT inhaler INHALE 1 TO 2 PUFFS DAILY 8 g  5  . BREO ELLIPTA 100-25 MCG/INH AEPB Inhale 1 puff into the lungs daily. 28 each 4  . buPROPion (WELLBUTRIN SR) 100 MG 12 hr tablet Take 1 tablet (100 mg total) by mouth 2 (two) times daily. 180 tablet 0  . cyclobenzaprine (FLEXERIL) 10 MG tablet Take 10 mg by mouth 3 (three) times daily as needed for muscle spasms.    . DULoxetine (CYMBALTA) 60 MG capsule Take 1 capsule (60 mg total) by mouth 2 (two) times daily. 180 capsule 0  . furosemide (LASIX) 20 MG tablet TAKE 1 TABLET BY MOUTH TWICE A WEEK  5  . gabapentin (NEURONTIN) 600 MG tablet Take 1  tablet (600 mg total) by mouth 2 (two) times daily. 180 tablet 1  . medroxyPROGESTERone (DEPO-PROVERA) 150 MG/ML injection     . medroxyPROGESTERone Acetate 150 MG/ML SUSY INJECT 1 ML (150 MG TOTAL) INTO THE MUSCLE EVERY 3 (THREE) MONTHS. 1 mL 3  . meloxicam (MOBIC) 7.5 MG tablet TAKE 1 TABLET BY MOUTH EVERY DAY 30 tablet 3  . omeprazole (PRILOSEC) 40 MG capsule Take 1 capsule (40 mg total) by mouth daily. 90 capsule 3  . propranolol (INDERAL) 10 MG tablet TAKE 1 TABLET BY MOUTH THREE TIMES A DAY AS NEEDED 270 tablet 1  . QUEtiapine (SEROQUEL) 100 MG tablet     . rosuvastatin (CRESTOR) 20 MG tablet TAKE 1 TABLET BY MOUTH EVERY DAY 90 tablet 3  . Suvorexant (BELSOMRA) 10 MG TABS Take 10 mg by mouth at bedtime. 30 tablet 1  . topiramate (TOPAMAX) 100 MG tablet Take 1.5 tablets (150 mg total) by mouth daily. 150 MG AM AND 200 MG PM ,BEING PRESCRIBED PER NEUROLOGY 135 tablet 1  . Ubrogepant 100 MG TABS Take by mouth.     No current facility-administered medications for this visit.     Musculoskeletal: Strength & Muscle Tone: UTA Gait & Station: UTA Patient leans: N/A  Psychiatric Specialty Exam: Review of Systems  Psychiatric/Behavioral: The patient is nervous/anxious.   All other systems reviewed and are negative.   There were no vitals taken for this visit.There is no height or weight on file to calculate BMI.  General Appearance: Casual   Eye Contact:  Fair  Speech:  Clear and Coherent  Volume:  Normal  Mood:  Anxious  Affect:  Congruent  Thought Process:  Irrelevant and Descriptions of Associations: Intact  Orientation:  Full (Time, Place, and Person)  Thought Content: Logical   Suicidal Thoughts:  No  Homicidal Thoughts:  No  Memory:  Immediate;   Fair Recent;   Fair Remote;   Fair  Judgement:  Fair  Insight:  Fair  Psychomotor Activity:  Normal  Concentration:  Concentration: Fair and Attention Span: Fair  Recall:  [employer] of Knowledge: Fair  Language: Fair  Akathisia:  No  Handed:  Right  AIMS (if indicated): UTA  Assets:  Communication Skills Desire for Improvement Housing Social Support  ADL's:  Intact  Cognition: WNL  Sleep:  OK   Screenings: AUDIT   Flowsheet Row Office Visit from 05/22/2016 in [facility]  Alcohol Use Disorder Identification Test Final Score (AUDIT) 0    GAD-7   Flowsheet Row Counselor from 10/06/2020 in [facility] Office Visit from 08/10/2019 in [facility]  Total GAD-7 Score 15 14    PHQ2-9   Flowsheet Row Counselor from 10/06/2020 in [facility] Office Visit from 08/10/2019 in [facility] Visit from 05/22/2016 in [facility]  PHQ-2 Total Score 4 3 4   PHQ-9 Total Score 19 15 18        Assessment and Plan: Mary Koch is a 50 year old Caucasian female who has a history of MDD, panic attacks, tobacco use disorder, caffeine use disorder, insomnia was evaluated by telemedicine today.  Patient continues to have several psychosocial stressors including health problems of her family members, her own medical problems, current pandemic, financial stressors and so on.  Patient does struggle with anxiety symptoms although reports psychotherapy sessions is beneficial.  Plan as noted below.  Plan MDD in remission Wellbutrin 100 mg p.o. twice  daily Cymbalta 60  mg p.o. twice daily  Panic attacks- unstable Cymbalta as prescribed Patient prescribed gabapentin 600 mg p.o. twice daily has been using it as 300 mg in the morning and 600 mg in the afternoon. Advised patient to increase the dosage as prescribed. Propranolol 10 mg p.o. 3 times daily as needed Patient advised to continue CBT with her therapist.  Agoraphobia-stable Continue CBT  Insomnia- improving   Belsomra 10 mg p.o. nightly Pending sleep study report  Tobacco use disorder-unstable We will start nicotine gum as needed. Provided [place] quit now program resources.  Caffeine use disorder- improving Provided counseling.  I have reviewed notes for neurologist- Ms.Konz-dated 01/01/2021-patient scheduled for EEG.  Continue gabapentin and Seroquel.'  Follow-up in clinic in 6 to 8 weeks or sooner if needed.  I have spent atleast 20 minutes face to face by video with patient today. More than 50 % of the time was spent for preparing to see the patient ( e.g., review of test, records ), ordering medications and test ,psychoeducation and supportive psychotherapy and care coordination,as well as documenting clinical information in electronic health record. This note was generated in part or whole with voice recognition software. Voice recognition is usually quite accurate but there are transcription errors that can and very often do occur. I apologize for any typographical errors that were not detected and corrected.       Ursula Alert, MD 01/10/2021, 9:13 AM

## 2021-01-10 NOTE — Patient Instructions (Signed)
Nicotine chewing gum What is this medicine? NICOTINE ([place]) helps people stop smoking. This medicine replaces the nicotine found in cigarettes and helps to decrease withdrawal effects. It is most effective when used in combination with a stop-smoking program. This medicine may be used for other purposes; ask your health care provider or pharmacist if you have questions. COMMON BRAND NAME(S): NICOrelief, Nicorette What should I tell my health care provider before I take this medicine? They need to know if you have any of these conditions:  diabetes  heart disease, angina, irregular heartbeat or previous heart attack  high blood pressure  lung disease, including asthma  overactive thyroid  pheochromocytoma  seizures or history of seizures  stomach problems or ulcers  an unusual or allergic reaction to nicotine, other medicines, foods, dyes, or preservatives  pregnant or trying to get pregnant  breast-feeding How should I use this medicine? Chew this gum in the mouth. Do not swallow the gum. Follow the directions on the product label. Use exactly as directed. When you feel an urgent desire for a cigarette, chew one piece of gum slowly. Chew until you feel a slight tingling in your mouth. Then, stop chewing and place the gum between your cheek and gum. Wait until the taste or tingling is almost gone, then start chewing again. Continue chewing in this manner for about 30 minutes. Slow chewing helps reduce cravings. Do not use more often than directed. This drug comes with INSTRUCTIONS FOR USE. Ask your pharmacist for directions on how to use this drug. Read the information carefully. Talk to your pharmacist or health care provider if you have questions. Talk to your pediatrician regarding the use of this medicine in children. Special care may be needed. Overdosage: If you think you have taken too much of this medicine contact a poison control center or emergency room at  once. NOTE: This medicine is only for you. Do not share this medicine with others. What if I miss a dose? This does not apply. Do not use more than one piece of gum at a time. What may interact with this medicine?  certain medicines for lung or breathing disease, like asthma  medicines for blood pressure  medicines for depression This list may not describe all possible interactions. Give your health care provider a list of all the medicines, herbs, non-prescription drugs, or dietary supplements you use. Also tell them if you smoke, drink alcohol, or use illegal drugs. Some items may interact with your medicine. What should I watch for while using this medicine? Visit your health care provider for regular checks on your progress. Talk to your health care provider about what you can do to improve your chances of quitting. What side effects may I notice from receiving this medicine? Side effects that you should report to your doctor or health care professional as soon as possible:  allergic reactions like skin rash, itching or hives, swelling of the face, lips, or tongue  blisters in mouth  breathing problems  changes in hearing  changes in vision  chest pain  cold sweats  confusion  fast, irregular heartbeat  feeling faint or lightheaded, falls  headache  increased saliva  nausea, vomiting  stomach pain  weakness Side effects that usually do not require medical attention (report to your doctor or health care professional if they continue or are bothersome):  diarrhea  dry mouth  hiccups  irritability  nervousness or restlessness  trouble sleeping or vivid dreams This list may  not describe all possible side effects. Call your doctor for medical advice about side effects. You may report side effects to FDA at 1-800-FDA-1088. Where should I keep my medicine? This product may have enough nicotine in it to make children and pets sick. Keep it away from children  and pets. After using, throw away as directed on the package. Store at room temperature between 15 and 30 degrees C (59 and 86 degrees F). Protect from heat and light. Keep this medicine in the original container until ready to use. Throw away unused medicine after the expiration date. NOTE: This sheet is a summary. It may not cover all possible information. If you have questions about this medicine, talk to your doctor, pharmacist, or health care provider.  2021 Elsevier/Gold Standard (2019-12-02 17:05:39) Managing the Challenge of Quitting Smoking Quitting smoking is a physical and mental challenge. You will face cravings, withdrawal symptoms, and temptation. Before quitting, work with your health care provider to make a plan that can help you manage quitting. Preparation can help you quit and keep you from giving in. How to manage lifestyle changes Managing stress Stress can make you want to smoke, and wanting to smoke may cause stress. It is important to find ways to manage your stress. You might try some of the following:  Practice relaxation techniques. ? Breathe slowly and deeply, in through your nose and out through your mouth. ? Listen to music. ? Soak in a bath or take a shower. ? Imagine a peaceful place or vacation.  Get some support. ? Talk with family or friends about your stress. ? Join a support group. ? Talk with a counselor or therapist.  Get some physical activity. ? Go for a walk, run, or bike ride. ? Play a favorite sport. ? Practice yoga.   Medicines Talk with your health care provider about medicines that might help you deal with cravings and make quitting easier for you. Relationships Social situations can be difficult when you are quitting smoking. To manage this, you can:  Avoid parties and other social situations where people might be smoking.  Avoid alcohol.  Leave right away if you have the urge to smoke.  Explain to your family and friends that you  are quitting smoking. Ask for support and let them know you might be a bit grumpy.  Plan activities where smoking is not an option. General instructions Be aware that many people gain weight after they quit smoking. However, not everyone does. To keep from gaining weight, have a plan in place before you quit and stick to the plan after you quit. Your plan should include:  Having healthy snacks. When you have a craving, it may help to: ? Eat popcorn, carrots, celery, or other cut vegetables. ? Chew sugar-free gum.  Changing how you eat. ? Eat small portion sizes at meals. ? Eat 4-6 small meals throughout the day instead of 1-2 large meals a day. ? Be mindful when you eat. Do not watch television or do other things that might distract you as you eat.  Exercising regularly. ? Make time to exercise each day. If you do not have time for a long workout, do short bouts of exercise for 5-10 minutes several times a day. ? Do some form of strengthening exercise, such as weight lifting. ? Do some exercise that gets your heart beating and causes you to breathe deeply, such as walking fast, running, swimming, or biking. This is very important.  Drinking plenty of  water or other low-calorie or no-calorie drinks. Drink 6-8 glasses of water daily.   How to recognize withdrawal symptoms Your body and mind may experience discomfort as you try to get used to not having nicotine in your system. These effects are called withdrawal symptoms. They may include:  Feeling hungrier than normal.  Having trouble concentrating.  Feeling irritable or restless.  Having trouble sleeping.  Feeling depressed.  Craving a cigarette. To manage withdrawal symptoms:  Avoid places, people, and activities that trigger your cravings.  Remember why you want to quit.  Get plenty of sleep.  Avoid coffee and other caffeinated drinks. These may worsen some of your symptoms. These symptoms may surprise you. But be  assured that they are normal to have when quitting smoking. How to manage cravings Come up with a plan for how to deal with your cravings. The plan should include the following:  A definition of the specific situation you want to deal with.  An alternative action you will take.  A clear idea for how this action will help.  The name of someone who might help you with this. Cravings usually last for 5-10 minutes. Consider taking the following actions to help you with your plan to deal with cravings:  Keep your mouth busy. ? Chew sugar-free gum. ? Suck on hard candies or a straw. ? Brush your teeth.  Keep your hands and body busy. ? Change to a different activity right away. ? Squeeze or play with a ball. ? Do an activity or a hobby, such as making bead jewelry, practicing needlepoint, or working with wood. ? Mix up your normal routine. ? Take a short exercise break. Go for a quick walk or run up and down stairs.  Focus on doing something kind or helpful for someone else.  Call a friend or family member to talk during a craving.  Join a support group.  Contact a quitline. Where to find support To get help or find a support group:  Call the [employer] [facility]'s Smoking Quitline: 1-800-QUIT NOW 810 706 6805)  Visit the website of the Substance Abuse and [place]: ktimeonline.com  Text QUIT to SmokefreeTXTAZ:4618977 Where to find more information Visit these websites to find more information on quitting smoking:  [place]: www.smokefree.gov  American Lung Association: www.lung.org  American Cancer Society: www.cancer.org  Centers for Disease Control and Prevention: http://www.wolf.info/  American Heart Association: www.heart.org Contact a health care provider if:  You want to change your plan for quitting.  The medicines you are taking are not helping.  Your eating feels out of control or you cannot sleep. Get help right away  if:  You feel depressed or become very anxious. Summary  Quitting smoking is a physical and mental challenge. You will face cravings, withdrawal symptoms, and temptation to smoke again. Preparation can help you as you go through these challenges.  Try different techniques to manage stress, handle social situations, and prevent weight gain.  You can deal with cravings by keeping your mouth busy (such as by chewing gum), keeping your hands and body busy, calling family or friends, or contacting a quitline for people who want to quit smoking.  You can deal with withdrawal symptoms by avoiding places where people smoke, getting plenty of rest, and avoiding drinks with caffeine. This information is not intended to replace advice given to you by your health care provider. Make sure you discuss any questions you have with your health care provider. Document  Revised: 10/05/2019 Document Reviewed: 10/05/2019 Elsevier Patient Education  [place].

## 2021-01-19 ENCOUNTER — Other Ambulatory Visit: Payer: Self-pay

## 2021-01-19 ENCOUNTER — Encounter: Payer: Self-pay | Admitting: Licensed Clinical Social Worker

## 2021-01-19 ENCOUNTER — Ambulatory Visit (INDEPENDENT_AMBULATORY_CARE_PROVIDER_SITE_OTHER): Payer: Medicare HMO | Admitting: Licensed Clinical Social Worker

## 2021-01-19 DIAGNOSIS — F4000 Agoraphobia, unspecified: Secondary | ICD-10-CM | POA: Diagnosis not present

## 2021-01-19 DIAGNOSIS — F331 Major depressive disorder, recurrent, moderate: Secondary | ICD-10-CM

## 2021-01-19 DIAGNOSIS — F410 Panic disorder [episodic paroxysmal anxiety] without agoraphobia: Secondary | ICD-10-CM

## 2021-01-19 NOTE — Progress Notes (Signed)
Virtual Visit via Video Note  I connected with [name] on 01/19/21 at 11:00 AM EST by a video enabled telemedicine application and verified that I am speaking with the correct person using two identifiers.  Participating Parties Patient Provider  Location: Patient: Home  Provider: Home Office   I discussed the limitations of evaluation and management by telemedicine and the availability of in person appointments. The patient expressed understanding and agreed to proceed.  THERAPY PROGRESS NOTE  Session Time: 20 Minutes  Participation Level: Active  Behavioral Response: CasualLethargicAnxious  Type of Therapy: Individual Therapy  Treatment Goals addressed: Anxiety and Coping  Interventions: Supportive  Summary: Mary Koch is a 50 y.o. female who presents with anxiety and depression sxs. Pt reported she is "fine" today. Pt reported she has been trying to distract herself from anxious thoughts regarding health concerns for self and loved ones. Pt reported that her neurology appointment is scheduled for 01/25/20 and that she will find out about her ex-husband's condition on 02/07/21.  Pt reported what works is playing games on her phone, feeling safe staying in her home, and watching TV. Pt discussed her love for shows about solving crimes. Pt reported no other concerns at this time.  Suicidal/Homicidal: No  Therapist Response: Therapist met with patient for follow up session. Therapist and patient explored use of coping skills to manage sxs. Therapist and patient discussed ways to use her interests/hobbies as a way to distract self from worrying about things outside of her control. Pt was receptive.   Plan: Return again in 2 weeks.  Diagnosis: Axis I: Panic Disorder and Agoraphobia, MDD, Recurrent, Moderate    Axis II: N/A  Josephine Igo, LCSW, LCAS 01/19/2021

## 2021-01-25 DIAGNOSIS — R55 Syncope and collapse: Secondary | ICD-10-CM | POA: Diagnosis not present

## 2021-01-29 ENCOUNTER — Ambulatory Visit (INDEPENDENT_AMBULATORY_CARE_PROVIDER_SITE_OTHER): Payer: Medicare HMO | Admitting: Certified Nurse Midwife

## 2021-01-29 ENCOUNTER — Other Ambulatory Visit: Payer: Self-pay

## 2021-01-29 DIAGNOSIS — Z3042 Encounter for surveillance of injectable contraceptive: Secondary | ICD-10-CM | POA: Diagnosis not present

## 2021-01-29 MED ORDER — MEDROXYPROGESTERONE ACETATE 150 MG/ML IM SUSP
150.0000 mg | Freq: Once | INTRAMUSCULAR | Status: AC
  Administered 2021-01-29: 150 mg via INTRAMUSCULAR

## 2021-01-29 NOTE — Progress Notes (Signed)
Date last pap: NA Last Depo-Provera: 11/10/2020 Side Effects if any: None Serum HCG indicated? NA Depo-Provera 150 mg IM given by: J. [facility] CMA Next appointment due 04/16/2021-04/30/2021

## 2021-01-29 NOTE — Progress Notes (Signed)
I have reviewed the record and concur with patient management and plan of care.    Dani Gobble, CNM [facility], [facility] 01/29/21 2:55 PM

## 2021-01-30 DIAGNOSIS — E538 Deficiency of other specified B group vitamins: Secondary | ICD-10-CM | POA: Diagnosis not present

## 2021-02-02 ENCOUNTER — Other Ambulatory Visit: Payer: Self-pay

## 2021-02-02 ENCOUNTER — Encounter: Payer: Self-pay | Admitting: Licensed Clinical Social Worker

## 2021-02-02 ENCOUNTER — Ambulatory Visit (INDEPENDENT_AMBULATORY_CARE_PROVIDER_SITE_OTHER): Payer: Medicare HMO | Admitting: Licensed Clinical Social Worker

## 2021-02-02 DIAGNOSIS — F331 Major depressive disorder, recurrent, moderate: Secondary | ICD-10-CM

## 2021-02-02 DIAGNOSIS — F4000 Agoraphobia, unspecified: Secondary | ICD-10-CM | POA: Diagnosis not present

## 2021-02-02 DIAGNOSIS — F410 Panic disorder [episodic paroxysmal anxiety] without agoraphobia: Secondary | ICD-10-CM

## 2021-02-02 NOTE — Progress Notes (Signed)
Virtual Visit via Video Note  I connected with [name] on 02/02/21 at 11:00 AM EST by a video enabled telemedicine application and verified that I am speaking with the correct person using two identifiers.  Participating Parties Patient Provider  Location: Patient: Home Provider: Home Office   I discussed the limitations of evaluation and management by telemedicine and the availability of in person appointments. The patient expressed understanding and agreed to proceed.  THERAPY PROGRESS NOTE  Session Time: 30 Minutes  Participation Level: Minimal  Behavioral Response: CasualAlertDepressed  Type of Therapy: Individual Therapy  Treatment Goals addressed: Anger and Coping  Interventions: CBT  Summary: Mary Koch is a 50 y.o. female who presents with anxiety and depression sxs. Pt reported she was able to get approved for CPAP machine to help with sleep apnea. Pt reported no new information from doctor about what was causing black outs and falls patient experienced about one month ago. Pt reported no current issues with this. Pt reported anxiety sxs have remained about the same since last treatment plan update. Pt reported worsening depression in relation to decline of family members' health. Pt reported her children continue to express frustration with patient's ambivalence about leaving the home. Pt reported she visits w/ her mother about once per month and hasn't seen brother since the holidays. Pt reported she plans to leave the house to visit father's grave today and talks to him.   Suicidal/Homicidal: No  Therapist Response: Therapist met with patient for follow up. Therapist and patient reviewed lack of progression towards goals and continued barriers. Therapist assisted patient in coming up with specific objectives to encourage patient action between sessions. Pt was somewhat receptive.  Plan: Return again in 3 weeks.  Diagnosis: Axis I: Panic Disorder and  Agoraphobia, MDD, Recurrent, Moderate    Axis II: N/A  Josephine Igo, LCSW, LCAS 02/02/2021

## 2021-02-05 ENCOUNTER — Other Ambulatory Visit: Payer: Self-pay | Admitting: Psychiatry

## 2021-02-05 DIAGNOSIS — F410 Panic disorder [episodic paroxysmal anxiety] without agoraphobia: Secondary | ICD-10-CM

## 2021-02-07 DIAGNOSIS — G43119 Migraine with aura, intractable, without status migrainosus: Secondary | ICD-10-CM | POA: Diagnosis not present

## 2021-02-18 ENCOUNTER — Other Ambulatory Visit: Payer: Self-pay | Admitting: Psychiatry

## 2021-02-18 DIAGNOSIS — F5105 Insomnia due to other mental disorder: Secondary | ICD-10-CM

## 2021-02-18 DIAGNOSIS — F410 Panic disorder [episodic paroxysmal anxiety] without agoraphobia: Secondary | ICD-10-CM

## 2021-02-22 ENCOUNTER — Ambulatory Visit (INDEPENDENT_AMBULATORY_CARE_PROVIDER_SITE_OTHER): Payer: Medicare HMO | Admitting: Licensed Clinical Social Worker

## 2021-02-22 ENCOUNTER — Other Ambulatory Visit: Payer: Self-pay

## 2021-02-22 ENCOUNTER — Encounter: Payer: Self-pay | Admitting: Licensed Clinical Social Worker

## 2021-02-22 DIAGNOSIS — F410 Panic disorder [episodic paroxysmal anxiety] without agoraphobia: Secondary | ICD-10-CM | POA: Diagnosis not present

## 2021-02-22 DIAGNOSIS — F17200 Nicotine dependence, unspecified, uncomplicated: Secondary | ICD-10-CM | POA: Diagnosis not present

## 2021-02-22 DIAGNOSIS — F331 Major depressive disorder, recurrent, moderate: Secondary | ICD-10-CM | POA: Diagnosis not present

## 2021-02-22 DIAGNOSIS — F4000 Agoraphobia, unspecified: Secondary | ICD-10-CM | POA: Diagnosis not present

## 2021-02-22 NOTE — Progress Notes (Signed)
Virtual Visit via Video Note  I connected with [name] on 02/22/21 at  2:00 PM EST by a video enabled telemedicine application and verified that I am speaking with the correct person using two identifiers.  Participating Parties Patient Provider  Location: Patient: Home Provider: Home Office   I discussed the limitations of evaluation and management by telemedicine and the availability of in person appointments. The patient expressed understanding and agreed to proceed.  THERAPY PROGRESS NOTE  Session Time: 30 Minutes  Participation Level: Active  Behavioral Response: CasualAlertDepressed  Type of Therapy: Individual Therapy  Treatment Goals addressed: Anxiety and Coping  Interventions: CBT  Summary: Mary Koch is a 50 y.o. female who presents with anxiety and depression sxs. Pt reported "I have been okay. Still waiting on CPAP referral. I didn't sleep good last night because feet and legs have been swollen and hurt". Pt reported she did answer a call from a long-time friend she has not seen in a while about 30 minutes before today's appointment. Pt reported it was a positive experience and "lost track of time" on the call. Pt reported her friend does not live far away and could be a source of support she can reach out to. Pt reported she continues to engage in solitary pleasant activities daily by playing games on her phone and watching her favorite TV shows. Pt reported she has not used any mindfulness/relaxation skills since last session due to no triggers for anxiety from avoidance of leaving the house. Pt reported she did leave the house at least once in past week to "buy cigarettes" from her local dollar store and did not experience anxiety because "it is pretty easy to do when no one is there".   Suicidal/Homicidal: No  Therapist Response: Therapist met with patient for follow up. Therapist and patient reviewed PHQ2-9, C-SRSS, nutrition and pain assessments.  Therapist and patient explored ways patient has made attempts to address anxiety and depression sxs through use of supports, engagement in pleasant activities, and small exposures. Pt was receptive to therapist feedback.  Plan: Return again in 2 weeks.  Diagnosis: Axis I: Panic Disorder and and Agoraphobia, MDD, Recurrent, Moderate, Tobacco Use    Axis II: N/A  Josephine Igo, LCSW, LCAS 02/22/2021

## 2021-02-26 ENCOUNTER — Encounter: Payer: Self-pay | Admitting: Psychiatry

## 2021-02-26 ENCOUNTER — Telehealth (INDEPENDENT_AMBULATORY_CARE_PROVIDER_SITE_OTHER): Payer: Medicare HMO | Admitting: Psychiatry

## 2021-02-26 ENCOUNTER — Other Ambulatory Visit: Payer: Self-pay

## 2021-02-26 DIAGNOSIS — F17200 Nicotine dependence, unspecified, uncomplicated: Secondary | ICD-10-CM | POA: Diagnosis not present

## 2021-02-26 DIAGNOSIS — F4000 Agoraphobia, unspecified: Secondary | ICD-10-CM | POA: Diagnosis not present

## 2021-02-26 DIAGNOSIS — F410 Panic disorder [episodic paroxysmal anxiety] without agoraphobia: Secondary | ICD-10-CM

## 2021-02-26 DIAGNOSIS — G4701 Insomnia due to medical condition: Secondary | ICD-10-CM

## 2021-02-26 DIAGNOSIS — F1590 Other stimulant use, unspecified, uncomplicated: Secondary | ICD-10-CM

## 2021-02-26 DIAGNOSIS — F331 Major depressive disorder, recurrent, moderate: Secondary | ICD-10-CM

## 2021-02-26 NOTE — Progress Notes (Signed)
Virtual Visit via Telephone Note  I connected with Mary Koch on 02/26/21 at  8:30 AM EST by telephone and verified that I am speaking with the correct person using two identifiers.  Location Provider Location : [facility] Patient Location : Home  Participants: Patient , Provider   I discussed the limitations, risks, security and privacy concerns of performing an evaluation and management service by telephone and the availability of in person appointments. I also discussed with the patient that there may be a patient responsible charge related to this service. The patient expressed understanding and agreed to proceed.    I discussed the assessment and treatment plan with the patient. The patient was provided an opportunity to ask questions and all were answered. The patient agreed with the plan and demonstrated an understanding of the instructions.   The patient was advised to call back or seek an in-person evaluation if the symptoms worsen or if the condition fails to improve as anticipated.   [facility] MD OP Progress Note  02/26/2021 9:46 AM [place]  MRN:  [id number]  Chief Complaint:  Chief Complaint    Follow-up     HPI: Mary Koch is a 50 year old Caucasian female who lives in [place], has a history of MDD, panic disorder, agoraphobia, insomnia, tobacco use disorder, caffeine use disorder was evaluated by married.  Patient today reports she continues to struggle with psychosocial stressors of her brother who was diagnosed with stage III lung cancer, mother with breast cancer ,her son's father who may have colon cancer.  Patient reports this does bother her on a regular basis.  She also reports she had her sleep study done.  She was recently diagnosed with sleep apnea.  She is waiting for her CPAP device.  She continues to struggle with concentration problems and fatigue during the day.  Patient denies any suicidality, homicidality or perceptual disturbances.  She is  compliant on medications as prescribed.  She continues to be in therapy and reports therapy sessions as beneficial.  Patient denies any other concerns today.  Visit Diagnosis:    ICD-10-CM   1. Major depressive disorder, recurrent episode, moderate (HCC)  F33.1   2. Panic disorder  F41.0   3. Agoraphobia  F40.00   4. Insomnia due to medical condition  G47.01    OSA  5. Tobacco use disorder  F17.200   6. 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  Past trials of Paxil, Luvox, Klonopin, Zoloft, Seroquel, Wellbutrin, Ambien  Past Medical History:  Past Medical History:  Diagnosis Date  . Allergic rhinitis   . Anemia   . Anxiety   . Anxiety and depression   . Carpal tunnel syndrome   . COVID-19 01/15/2020  . Depression   . Fibroids   . GERD (gastroesophageal reflux disease)   . Headache(784.0)   . Headache(784.0)   . Heavy periods   . Hyperthyroidism    right portion of thyroid removed  . Muscle spasm   . Neurological disorder    evaluation for ms  . Painful menstrual periods   . Shortness of breath    when i smoke a lot    Past Surgical History:  Procedure Laterality Date  . BREAST BIOPSY Left    neg  . carpel tunnel Left 2017  . CESAREAN SECTION     times 2  . CHOLECYSTECTOMY    .  THYROID LOBECTOMY      Family Psychiatric History: I have reviewed family psychiatric history from my progress note on 03/24/2018  Family History:  Family History  Problem Relation Age of Onset  . Osteoporosis Mother   . Breast cancer Mother 69  . Diabetes Father   . Anxiety disorder Father   . Depression Father   . Post-traumatic stress disorder Father   . Heart disease Father   . Anxiety disorder Sister   . Alcohol abuse Brother   . Colon cancer Neg Hx   . Ovarian cancer Neg Hx     Social History: I have reviewed social history from my progress note on 03/24/2018. Social  History   Socioeconomic History  . Marital status: Legally Separated    Spouse name: Not on file  . Number of children: Not on file  . Years of education: Not on file  . Highest education level: Not on file  Occupational History  . Not on file  Tobacco Use  . Smoking status: Current Every Day Smoker    Packs/day: 0.50    Years: 25.00    Pack years: 12.50    Types: Cigarettes    Start date: 05/22/1986  . Smokeless tobacco: Never Used  Vaping Use  . Vaping Use: Never used  Substance and Sexual Activity  . Alcohol use: No    Alcohol/week: 0.0 standard drinks  . Drug use: No  . Sexual activity: Yes    Partners: Male    Birth control/protection: Surgical, Injection  Other Topics Concern  . Not on file  Social History Narrative  . Not on file   Social Determinants of Health   Financial Resource Strain: Not on file  Food Insecurity: Not on file  Transportation Needs: Not on file  Physical Activity: Not on file  Stress: Not on file  Social Connections: Not on file    Allergies:  Allergies  Allergen Reactions  . Azithromycin Other (See Comments)    headache  . Codeine Other (See Comments)    unknown  . Penicillins Other (See Comments)    headaches    Metabolic Disorder Labs: No results found for: HGBA1C, MPG No results found for: PROLACTIN No results found for: CHOL, TRIG, HDL, CHOLHDL, VLDL, LDLCALC No results found for: TSH  Therapeutic Level Labs: No results found for: LITHIUM No results found for: VALPROATE No components found for:  CBMZ  Current Medications: Current Outpatient Medications  Medication Sig Dispense Refill  . albuterol (VENTOLIN HFA) 108 (90 Base) MCG/ACT inhaler INHALE 1 TO 2 PUFFS DAILY 8 g 5  . BREO ELLIPTA 100-25 MCG/INH AEPB Inhale 1 puff into the lungs daily. 28 each 4  . buPROPion (WELLBUTRIN SR) 100 MG 12 hr tablet Take 1 tablet (100 mg total) by mouth 2 (two) times daily. 180 tablet 0  . cyclobenzaprine (FLEXERIL) 10 MG tablet  Take 10 mg by mouth 3 (three) times daily as needed for muscle spasms.    . DULoxetine (CYMBALTA) 60 MG capsule Take 1 capsule (60 mg total) by mouth 2 (two) times daily. 180 capsule 0  . ergocalciferol (VITAMIN D2) 1.25 MG (50000 UT) capsule Take 1 capsule by mouth once a week. TAKES DIFFERENTLY - 1000 MG    . etodolac (LODINE) 400 MG tablet Take by mouth.    . furosemide (LASIX) 20 MG tablet TAKE 1 TABLET BY MOUTH TWICE A WEEK  5  . gabapentin (NEURONTIN) 600 MG tablet Take 1 tablet (600 mg total) by mouth  2 (two) times daily. (Patient taking differently: Take 600 mg by mouth at bedtime.) 180 tablet 1  . meloxicam (MOBIC) 7.5 MG tablet TAKE 1 TABLET BY MOUTH EVERY DAY 30 tablet 3  . omeprazole (PRILOSEC) 40 MG capsule Take 1 capsule (40 mg total) by mouth daily. 90 capsule 3  . propranolol (INDERAL) 10 MG tablet TAKE 1 TABLET BY MOUTH THREE TIMES A DAY AS NEEDED (Patient taking differently: Take 60 mg by mouth. DAILY) 270 tablet 1  . QUEtiapine (SEROQUEL) 100 MG tablet 50 mg.    . Suvorexant (BELSOMRA) 10 MG TABS Take 10 mg by mouth at bedtime. 30 tablet 1  . topiramate (TOPAMAX) 100 MG tablet Take 1.5 tablets (150 mg total) by mouth daily. 150 MG AM AND 200 MG PM ,BEING PRESCRIBED PER NEUROLOGY 135 tablet 1  . Ubrogepant 100 MG TABS Take by mouth.    . vitamin B-12 (CYANOCOBALAMIN) 1000 MCG tablet Take 1,000 mcg by mouth daily.    Marland Kitchen LORazepam (ATIVAN) 1 MG tablet Take by mouth. (Patient not taking: Reported on 02/26/2021)    . medroxyPROGESTERone (DEPO-PROVERA) 150 MG/ML injection     . medroxyPROGESTERone Acetate 150 MG/ML SUSY INJECT 1 ML (150 MG TOTAL) INTO THE MUSCLE EVERY 3 (THREE) MONTHS. 1 mL 3  . nicotine polacrilex (NICORETTE) 4 MG gum Take 1 each (4 mg total) by mouth as needed for smoking cessation. 100 tablet 0  . rosuvastatin (CRESTOR) 20 MG tablet TAKE 1 TABLET BY MOUTH EVERY DAY (Patient not taking: Reported on 02/26/2021) 90 tablet 3   No current facility-administered medications  for this visit.     Musculoskeletal: Strength & Muscle Tone: UTA Gait & Station: UTA Patient leans: N/A  Psychiatric Specialty Exam: Review of Systems  Constitutional: Positive for fatigue.  Psychiatric/Behavioral: Positive for decreased concentration, dysphoric mood and sleep disturbance.  All other systems reviewed and are negative.   There were no vitals taken for this visit.There is no height or weight on file to calculate BMI.  General Appearance: UTA  Eye Contact:  UTA  Speech:  Clear and Coherent  Volume:  Normal  Mood:  Depressed  Affect:  UTA  Thought Process:  Goal Directed and Descriptions of Associations: Intact  Orientation:  Full (Time, Place, and Person)  Thought Content: Logical   Suicidal Thoughts:  No  Homicidal Thoughts:  No  Memory:  Immediate;   Fair Recent;   Fair Remote;   Fair  Judgement:  Fair  Insight:  Fair  Psychomotor Activity:  UTA  Concentration:  Concentration: Fair and Attention Span: Fair  Recall:  [employer] of Knowledge: Fair  Language: Fair  Akathisia:  No  Handed:  Right  AIMS (if indicated): UTA  Assets:  Communication Skills Desire for Improvement Housing Social Support  ADL's:  Intact  Cognition: WNL  Sleep:  Poor   Screenings: AUDIT   Flowsheet Row Office Visit from 05/22/2016 in [facility]  Alcohol Use Disorder Identification Test Final Score (AUDIT) 0    GAD-7   Flowsheet Row Counselor from 10/06/2020 in [facility] Office Visit from 08/10/2019 in [facility]  Total GAD-7 Score 15 14    PHQ2-9   [place] Video Visit from 02/26/2021 in [facility] Counselor from 02/22/2021 in [facility] Counselor from 10/06/2020 in [facility] Office Visit from 08/10/2019 in [facility] Visit from 05/22/2016 in [facility]  PHQ-2 Total  Score 2 4 4 3 4   PHQ-9 Total Score 12 14 19 15 18     Flowsheet Row Video Visit from 02/26/2021 in [facility] Counselor from 02/22/2021 in [facility] No Risk No Risk       Assessment and Plan: Mary Koch is a 50 year old Caucasian female who has a history of MDD, panic attacks, tobacco use disorder, caffeine use disorder, insomnia was evaluated by telemedicine.  Patient with psychosocial stresses including health problems of her family members, her medical problems, current pandemic and financial stresses.  Patient relates to struggle with sleep related problems as well as fatigue and concentration trouble and does have recent psychosocial which does affect her mood.  Discussed plan as noted below.  Plan MDD-unstable Wellbutrin 100 mg p.o. twice daily Cymbalta 60 mg p.o. twice daily Patient was taken off of the Seroquel however today reports she has been taking Seroquel 50 mg at bedtime.  We will continue the same at this time.Prescribed by neurology. Patient advised to continue CBT for her recent situational stressors.  Panic attacks-improving Gabapentin 600 mg at bedtime-prescribed by neurology Cymbalta as prescribed Patient to continue CBT with her therapist.  Agoraphobia-stable Continue CBT  Insomnia-patient recently diagnosed with sleep apnea-unstable She is awaiting her CPAP. Continue Belsomra 10 mg p.o. nightly  Tobacco use disorder-unstable Provided [place] quit now program resources as discussed a referral to smoking cessation class  Caffeine use disorder-improving Provided counseling  Patient with multiple psychosocial stressors however is currently awaiting her CPAP device.  Discussed with patient to start using her CPAP, will reevaluate her and further medication changes can be done at that point  Follow-up in clinic in 4 weeks in person in office.  I have spent atleast 18 minutes with  patient today. More than 50 % of the time was spent for preparing to see the patient ( e.g., review of test, records ),  ordering medications and test ,psychoeducation and supportive psychotherapy and care coordination,as well as documenting clinical information in electronic health record.   This note was generated in part or whole with voice recognition software. Voice recognition is usually quite accurate but there are transcription errors that can and very often do occur. I apologize for any typographical errors that were not detected and corrected.       Ursula Alert, MD 02/26/2021, 9:46 AM

## 2021-03-04 IMAGING — MG DIGITAL SCREENING BILAT W/ TOMO W/ CAD
8 series · 8 of 24 positions shown · non-contrast
Comparison: Previous exam(s).

CLINICAL DATA: Screening.

EXAM:
DIGITAL SCREENING BILATERAL MAMMOGRAM WITH TOMO AND CAD

[L CC synth-2D]
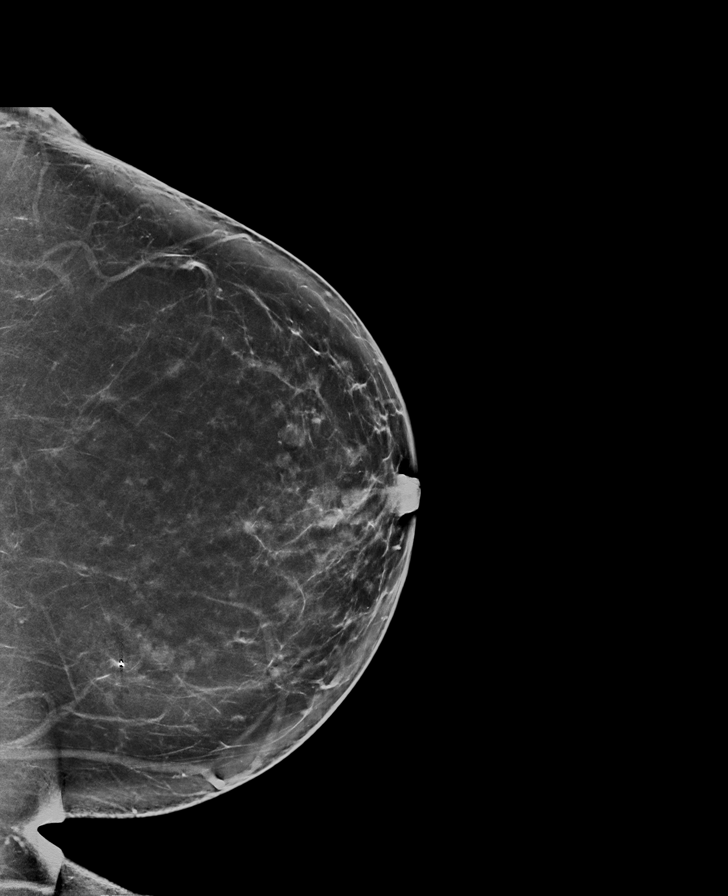

[L MLO synth-2D]
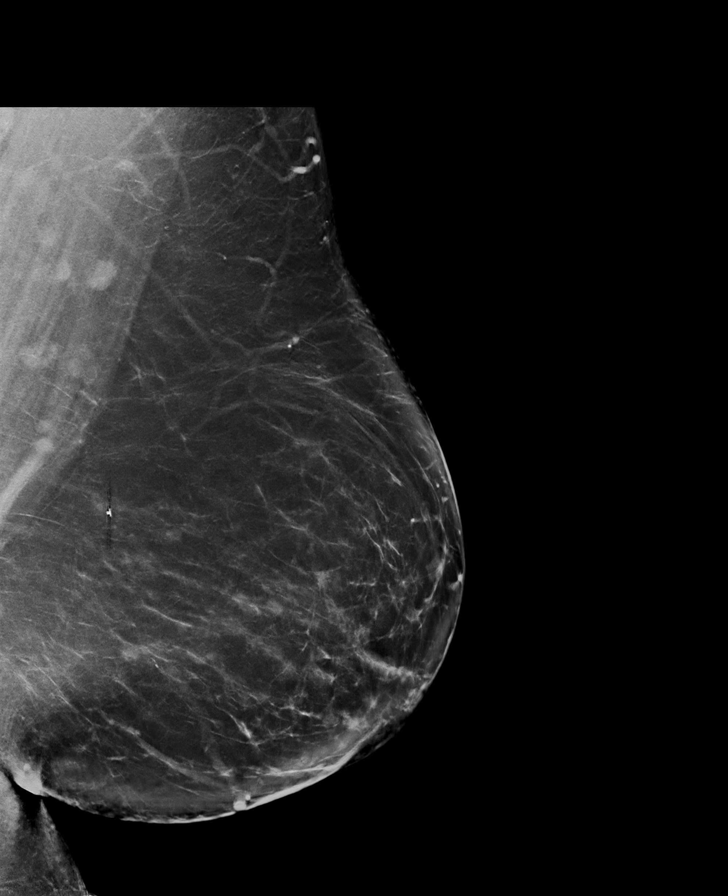

[R CC synth-2D]
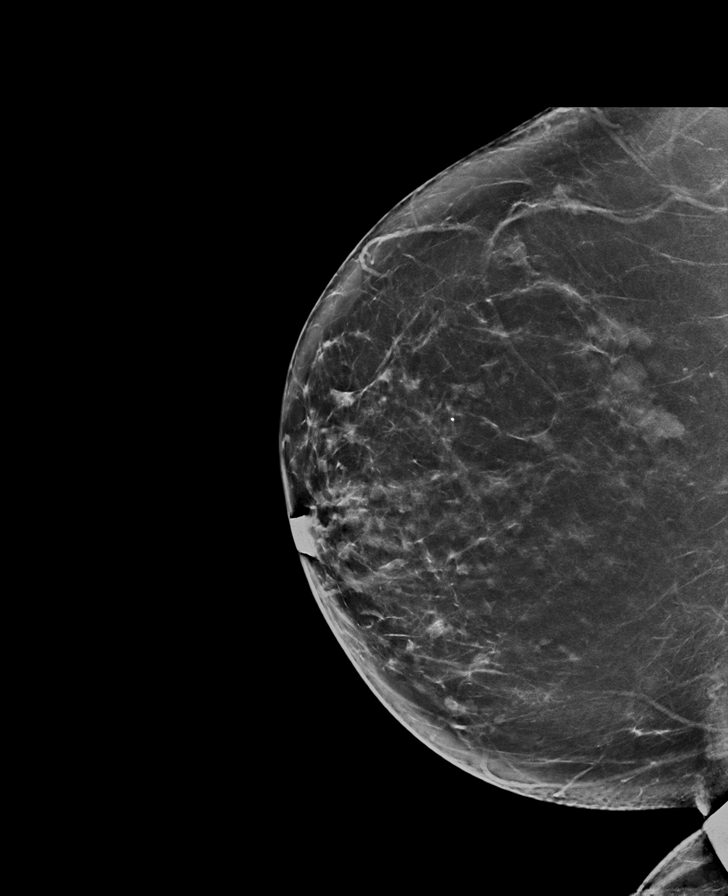

[R MLO synth-2D]
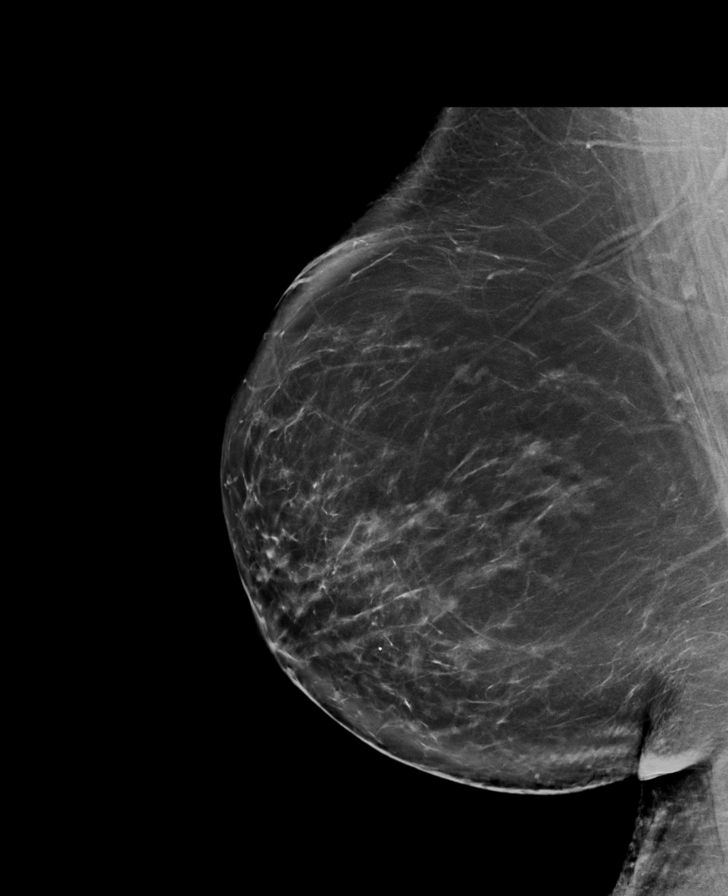

[L CC tomo · tomo slice 45/88.0]
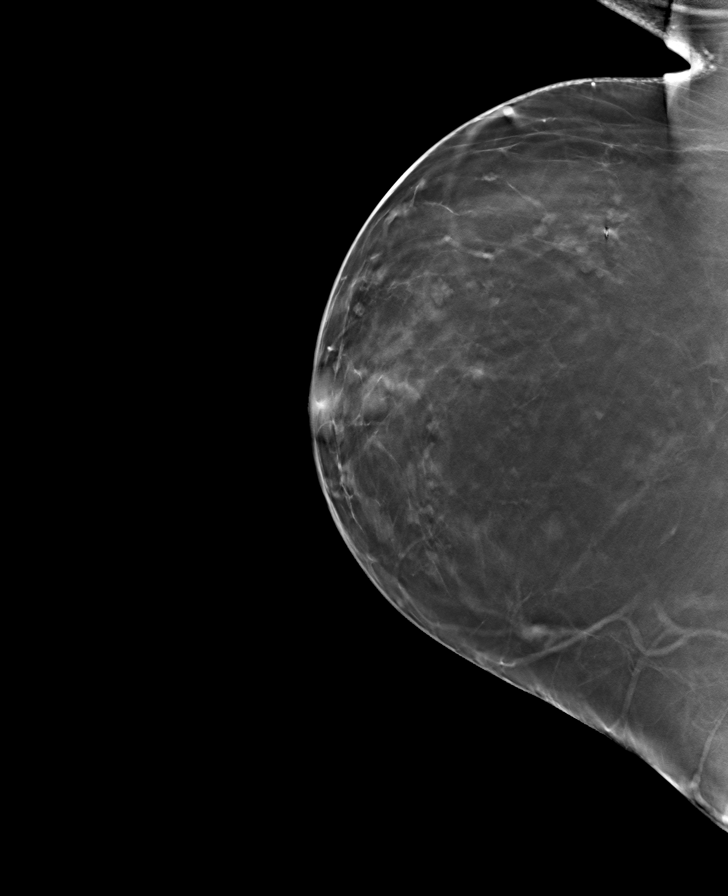

[R CC tomo · tomo slice 45/90.0]
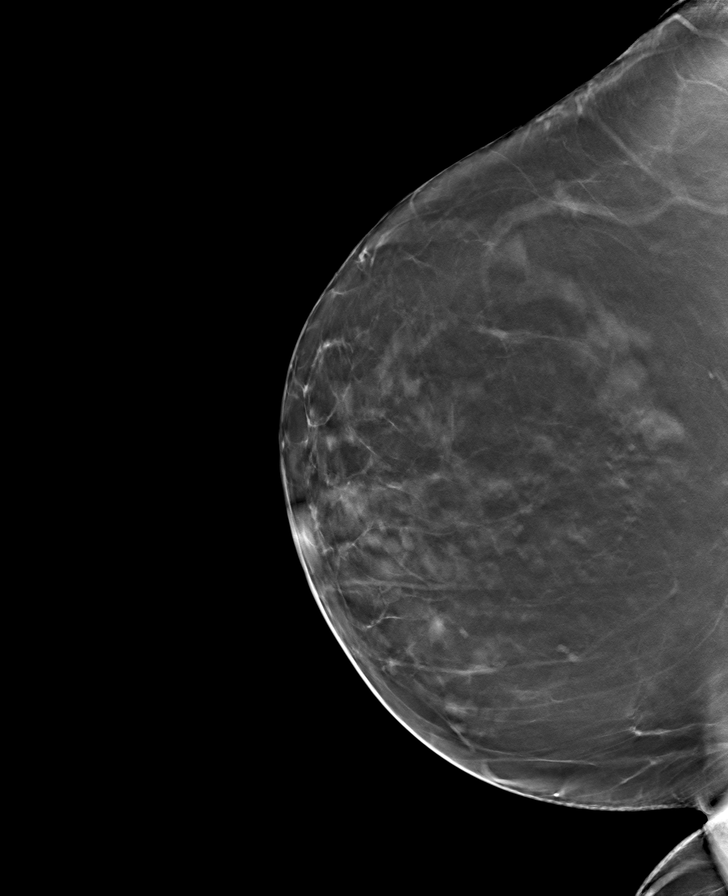

[R MLO tomo · tomo slice 51/101.0]
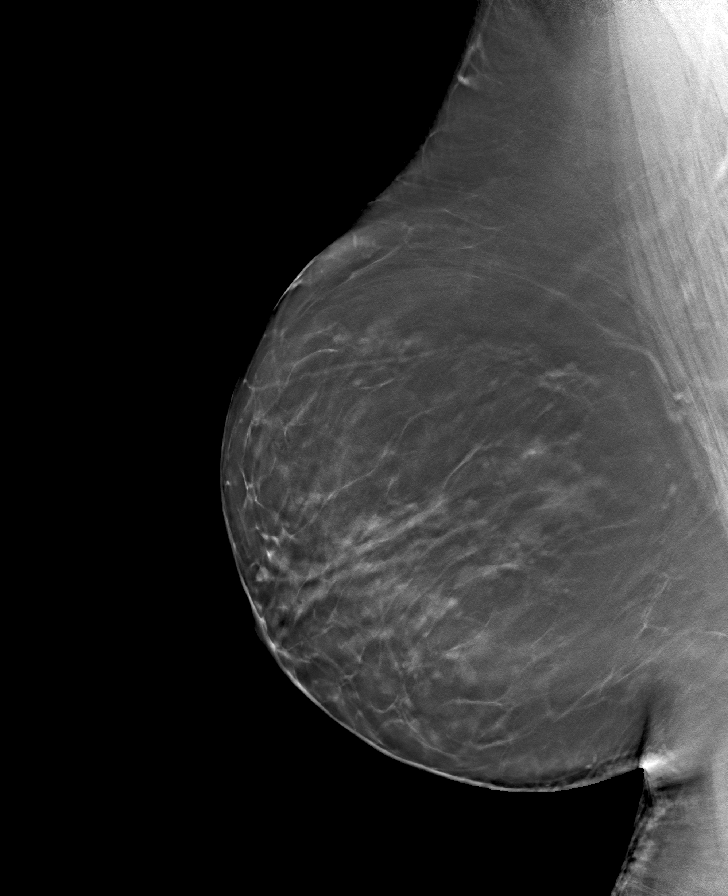

[L MLO tomo · tomo slice 53/104.0]
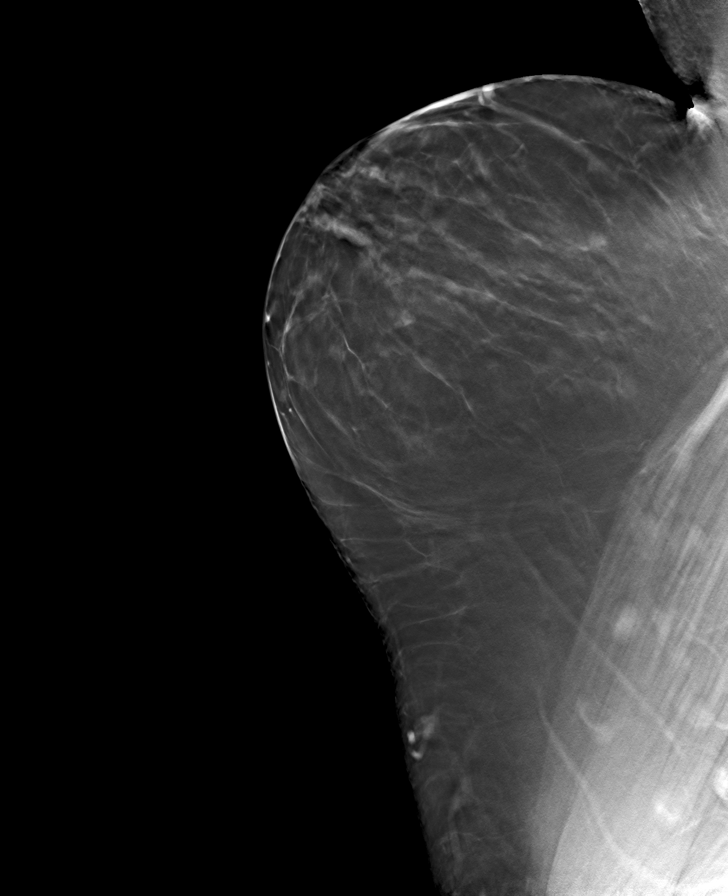

[8 of 24 positions shown; findings below may reference images not displayed]

ACR Breast Density Category b: There are scattered areas of
fibroglandular density.
FINDINGS: There are no findings suspicious for malignancy. Images were
processed with CAD.
IMPRESSION: No mammographic evidence of malignancy. A result letter of this
screening mammogram will be mailed directly to the patient.

RECOMMENDATION:
Screening mammogram in one year. (Code:[id number])

BI-RADS CATEGORY  1: Negative.

## 2021-03-08 ENCOUNTER — Other Ambulatory Visit: Payer: Self-pay

## 2021-03-08 ENCOUNTER — Encounter: Payer: Self-pay | Admitting: Licensed Clinical Social Worker

## 2021-03-08 ENCOUNTER — Ambulatory Visit (INDEPENDENT_AMBULATORY_CARE_PROVIDER_SITE_OTHER): Payer: Medicare HMO | Admitting: Licensed Clinical Social Worker

## 2021-03-08 DIAGNOSIS — F331 Major depressive disorder, recurrent, moderate: Secondary | ICD-10-CM

## 2021-03-08 DIAGNOSIS — F410 Panic disorder [episodic paroxysmal anxiety] without agoraphobia: Secondary | ICD-10-CM | POA: Diagnosis not present

## 2021-03-08 DIAGNOSIS — F4000 Agoraphobia, unspecified: Secondary | ICD-10-CM

## 2021-03-08 NOTE — Progress Notes (Signed)
Virtual Visit via Video Note  I connected with Mary Koch on 03/08/21 at  2:00 PM EST by a video enabled telemedicine application and verified that I am speaking with the correct person using two identifiers.  Participating Parties Patient Provider  Location: Patient: Home Provider: Home Office   I discussed the limitations of evaluation and management by telemedicine and the availability of in person appointments. The patient expressed understanding and agreed to proceed.  THERAPY PROGRESS NOTE  Session Time: 30 Minutes  Participation Level: Active  Behavioral Response: CasualAlertEuthymic  Type of Therapy: Individual Therapy  Treatment Goals addressed: Anxiety and Coping  Interventions: CBT  Summary: Mary Koch is a 50 y.o. female who presents with anxiety and depression sxs. Pt reported "things are going okay". Pt reported she visited with her mother and brother earlier this week. Pt also went with son for ride this morning to see about a job for him. Pt reported she also went into a [employer] twice. Pt reported "at first when I saw all the cars in the parking lot I just wanted to stay in the car". However, patient had to use the restroom from taking fluid pills. Pt reported "I got lucky" because it was not crowded on her side of the restaurant. Pt reported she still has not received sleep apnea machine, however, doctor's appointment is next week. Pt reported she decided to stay in current living environment because "if I stay with my mom I would have to get rid of some of my animals".   Suicidal/Homicidal: No  Therapist Response: Therapist met with patient for follow up. Therapist and patient reviewed social exposures at least once per week, in which patient was successful. Therapist and patient discussed helping her son become more independent and visiting relatives.  Plan: Return again in 2 weeks.  Diagnosis: Axis I: Panic Disorder and Agoraphobia, MDD, Recurrent,  Moderate    Axis II: N/A  Loyal Gambler, LCSW, LCAS 03/08/2021

## 2021-03-15 ENCOUNTER — Ambulatory Visit: Payer: Medicare HMO | Admitting: Dermatology

## 2021-03-17 ENCOUNTER — Other Ambulatory Visit: Payer: Self-pay | Admitting: Psychiatry

## 2021-03-17 DIAGNOSIS — F410 Panic disorder [episodic paroxysmal anxiety] without agoraphobia: Secondary | ICD-10-CM

## 2021-03-17 DIAGNOSIS — F5105 Insomnia due to other mental disorder: Secondary | ICD-10-CM

## 2021-03-22 ENCOUNTER — Encounter: Payer: Self-pay | Admitting: Licensed Clinical Social Worker

## 2021-03-22 ENCOUNTER — Other Ambulatory Visit: Payer: Self-pay

## 2021-03-22 ENCOUNTER — Ambulatory Visit (INDEPENDENT_AMBULATORY_CARE_PROVIDER_SITE_OTHER): Payer: Medicare HMO | Admitting: Licensed Clinical Social Worker

## 2021-03-22 DIAGNOSIS — F410 Panic disorder [episodic paroxysmal anxiety] without agoraphobia: Secondary | ICD-10-CM

## 2021-03-22 DIAGNOSIS — G4701 Insomnia due to medical condition: Secondary | ICD-10-CM

## 2021-03-22 DIAGNOSIS — F3341 Major depressive disorder, recurrent, in partial remission: Secondary | ICD-10-CM

## 2021-03-22 DIAGNOSIS — F331 Major depressive disorder, recurrent, moderate: Secondary | ICD-10-CM

## 2021-03-22 DIAGNOSIS — F4000 Agoraphobia, unspecified: Secondary | ICD-10-CM | POA: Diagnosis not present

## 2021-03-22 DIAGNOSIS — F17200 Nicotine dependence, unspecified, uncomplicated: Secondary | ICD-10-CM

## 2021-03-22 NOTE — Progress Notes (Signed)
Virtual Visit via Video Note  I connected with Mary Koch on 03/22/21 at  2:00 PM EDT by a video enabled telemedicine application and verified that I am speaking with the correct person using two identifiers.  Participating Parties Patient  Provider  Location: Patient: Home Provider: Home Office   I discussed the limitations of evaluation and management by telemedicine and the availability of in person appointments. The patient expressed understanding and agreed to proceed.  THERAPY PROGRESS NOTE  Session Time: 30 Minutes  Participation Level: Active  Behavioral Response: CasualAlertAnxious  Type of Therapy: Individual Therapy  Treatment Goals addressed: Anxiety and Coping  Interventions: CBT  Summary: [name] is a 50 y.o. female who presents with anxiety and depression sxs. 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 Pt acknowledged this as a step in the right direction. Pt reported "I haven't worked in 40 years". 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 Pt reported she is also continuing to keep in contact with her family members at least once per week. Pt reported she continues to struggle with sleep, getting about 4 hours per night, and has yet to receive sleep apnea device. Pt explained there has been a recall and it is taking a while to get a replacement.    Suicidal/Homicidal: No  Therapist Response: Therapist met with patient for follow up. Therapist and patient reviewed social exposure successes. Therapist and patient discussed ways patient may be able to  work at [employer] in another role that has little interaction with customers at this time and ways to manage anxiety sxs. Pt was receptive. Therapist informed patient regarding initial phase of terminating services with this therapist due to leaving the practice in the next 5 weeks. Pt denied any concerns around this and had no questions at this time.  Plan: Return again in 2 weeks.  Diagnosis: Axis I: Panic Disorder and Agoraphobia, MDD In Remission, Tobacco Use, Insomnia    Axis II: N/A  Josephine Igo, LCSW, LCAS 03/22/2021

## 2021-03-25 ENCOUNTER — Other Ambulatory Visit: Payer: Self-pay | Admitting: Internal Medicine

## 2021-03-27 ENCOUNTER — Other Ambulatory Visit: Payer: Self-pay

## 2021-03-27 ENCOUNTER — Encounter: Payer: Self-pay | Admitting: Psychiatry

## 2021-03-27 ENCOUNTER — Ambulatory Visit (INDEPENDENT_AMBULATORY_CARE_PROVIDER_SITE_OTHER): Payer: Medicare HMO | Admitting: Psychiatry

## 2021-03-27 VITALS — BP 135/85 | HR 99 | Temp 97.1°F | Wt 262.8 lb

## 2021-03-27 DIAGNOSIS — F4000 Agoraphobia, unspecified: Secondary | ICD-10-CM

## 2021-03-27 DIAGNOSIS — F410 Panic disorder [episodic paroxysmal anxiety] without agoraphobia: Secondary | ICD-10-CM | POA: Diagnosis not present

## 2021-03-27 DIAGNOSIS — F3342 Major depressive disorder, recurrent, in full remission: Secondary | ICD-10-CM

## 2021-03-27 DIAGNOSIS — F5105 Insomnia due to other mental disorder: Secondary | ICD-10-CM | POA: Diagnosis not present

## 2021-03-27 DIAGNOSIS — F1590 Other stimulant use, unspecified, uncomplicated: Secondary | ICD-10-CM

## 2021-03-27 DIAGNOSIS — F99 Mental disorder, not otherwise specified: Secondary | ICD-10-CM

## 2021-03-27 DIAGNOSIS — F17200 Nicotine dependence, unspecified, uncomplicated: Secondary | ICD-10-CM

## 2021-03-27 MED ORDER — BELSOMRA 10 MG PO TABS: 10.0000 mg | ORAL_TABLET | Freq: Every day | ORAL | 1 refills | Status: DC

## 2021-03-27 MED ORDER — BUPROPION HCL ER (SR) 100 MG PO TB12
100.0000 mg | ORAL_TABLET | Freq: Two times a day (BID) | ORAL | 0 refills | Status: DC
Start: 2021-03-27 — End: 2021-10-08

## 2021-03-27 MED ORDER — DULOXETINE HCL 60 MG PO CPEP
60.0000 mg | ORAL_CAPSULE | Freq: Two times a day (BID) | ORAL | 0 refills | Status: DC
Start: 2021-03-27 — End: 2021-07-09

## 2021-03-27 NOTE — Progress Notes (Signed)
[name] MD OP Progress Note  03/27/2021 1:44 PM [name]  MRN:  [id number]  Chief Complaint:  Chief Complaint    Follow-up; Anxiety; Insomnia     HPI: Mary Koch is a 50 year old Caucasian female who lives in [place], has a history of MDD, panic disorder, agoraphobia, insomnia, tobacco use disorder, caffeine use disorder was evaluated in person in office today.  Patient today reports she is currently struggling with multiple psychosocial stressors including health problems of her mother, her 67 year old son who wants to move to [place] and so on.  This does worry her a lot.  Patient also reports that she started a job at [employer] recently.  She however reports she does not know if she can keep up with the work there since it is been very busy.  She however reports she really needs to get a job since she has financial stressors and has to pay her bills.  She however reports she has been talking to her therapist which is helpful.  Patient reports she believes her medications as beneficial.  She is compliant.  Patient does report eye twitching, happened only once last Saturday.  It has not happened since then.  Does not know if this is a side effect of medications or not.  Patient reports sleep is improving.  She was diagnosed with obstructive sleep apnea however did not get the CPAP device and is still waiting.  Patient is also scheduled for an EEG since she had syncopal episodes.  She continues to follow-up with neurology for the same.  She reports she has not been able to cut back on the smoking and continues to smoke half a pack per day.  She however is willing to try again.  Patient reports she has been cutting back on caffeine use and currently drinks 1 cup every day at noon  Patient denies any suicidality, homicidality or perceptual disturbances.  Patient denies any other concerns today.  Visit Diagnosis:    ICD-10-CM   1. MDD (major depressive disorder), recurrent, in  full remission ([place])  F33.42 buPROPion (WELLBUTRIN SR) 100 MG 12 hr tablet  2. Panic disorder  F41.0 DULoxetine (CYMBALTA) 60 MG capsule  3. Agoraphobia  F40.00   4. Insomnia due to other mental disorder  F51.05 Suvorexant (BELSOMRA) 10 MG TABS   F99   5. Tobacco use disorder  F17.200   6. 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  Past trials of Paxil, Luvox, Klonopin, Zoloft, Seroquel, Wellbutrin, Ambien  Past Medical History:  Past Medical History:  Diagnosis Date  . Allergic rhinitis   . Anemia   . Anxiety   . Anxiety and depression   . Carpal tunnel syndrome   . COVID-19 01/15/2020  . Depression   . Fibroids   . GERD (gastroesophageal reflux disease)   . Headache(784.0)   . Headache(784.0)   . Heavy periods   . Hyperthyroidism    right portion of thyroid removed  . Muscle spasm   . Neurological disorder    evaluation for ms  . Painful menstrual periods   . Shortness of breath    when i smoke a lot    Past Surgical History:  Procedure Laterality Date  . BREAST BIOPSY Left    neg  . carpel tunnel Left 2017  . CESAREAN SECTION     times 2  . CHOLECYSTECTOMY    .  THYROID LOBECTOMY      Family Psychiatric History: I have reviewed family psychiatric history from my progress note on 03/24/2018  Family History:  Family History  Problem Relation Age of Onset  . Osteoporosis Mother   . Breast cancer Mother 75  . Diabetes Father   . Anxiety disorder Father   . Depression Father   . Post-traumatic stress disorder Father   . Heart disease Father   . Anxiety disorder Sister   . Alcohol abuse Brother   . Colon cancer Neg Hx   . Ovarian cancer Neg Hx     Social History: Reviewed social history from my progress note on 03/24/2018 Social History   Socioeconomic History  . Marital status: Legally Separated    Spouse name: Not on file  . Number of children: 2  . Years  of education: Not on file  . Highest education level: High school graduate  Occupational History  . Not on file  Tobacco Use  . Smoking status: Current Every Day Smoker    Packs/day: 0.50    Years: 25.00    Pack years: 12.50    Types: Cigarettes    Start date: 05/22/1986  . Smokeless tobacco: Never Used  Vaping Use  . Vaping Use: Never used  Substance and Sexual Activity  . Alcohol use: No    Alcohol/week: 0.0 standard drinks  . Drug use: No  . Sexual activity: Yes    Partners: Male    Birth control/protection: Surgical, Injection  Other Topics Concern  . Not on file  Social History Narrative  . Not on file   Social Determinants of Health   Financial Resource Strain: Not on file  Food Insecurity: Not on file  Transportation Needs: Not on file  Physical Activity: Not on file  Stress: Not on file  Social Connections: Not on file    Allergies:  Allergies  Allergen Reactions  . Azithromycin Other (See Comments)    headache  . Codeine Other (See Comments)    unknown  . Penicillins Other (See Comments)    headaches    Metabolic Disorder Labs: No results found for: HGBA1C, MPG No results found for: PROLACTIN No results found for: CHOL, TRIG, HDL, CHOLHDL, VLDL, LDLCALC No results found for: TSH  Therapeutic Level Labs: No results found for: LITHIUM No results found for: VALPROATE No components found for:  CBMZ  Current Medications: Current Outpatient Medications  Medication Sig Dispense Refill  . albuterol (VENTOLIN HFA) 108 (90 Base) MCG/ACT inhaler INHALE 1 TO 2 PUFFS BY MOUTH DAILY 6.7 each 5  . BREO ELLIPTA 100-25 MCG/INH AEPB Inhale 1 puff into the lungs daily. 28 each 4  . cyclobenzaprine (FLEXERIL) 10 MG tablet Take 10 mg by mouth 3 (three) times daily as needed for muscle spasms.    . ergocalciferol (VITAMIN D2) 1.25 MG (50000 UT) capsule Take 1 capsule by mouth once a week. TAKES DIFFERENTLY - 1000 MG    . furosemide (LASIX) 20 MG tablet TAKE 1 TABLET  BY MOUTH TWICE A WEEK  5  . gabapentin (NEURONTIN) 600 MG tablet Take 1 tablet (600 mg total) by mouth 2 (two) times daily. (Patient taking differently: Take 600 mg by mouth at bedtime.) 180 tablet 1  . meclizine (ANTIVERT) 12.5 MG tablet     . medroxyPROGESTERone (DEPO-PROVERA) 150 MG/ML injection     . meloxicam (MOBIC) 7.5 MG tablet TAKE 1 TABLET BY MOUTH EVERY DAY 30 tablet 3  . omeprazole (PRILOSEC) 40 MG capsule  Take 1 capsule (40 mg total) by mouth daily. 90 capsule 3  . propranolol (INDERAL) 10 MG tablet TAKE 1 TABLET BY MOUTH THREE TIMES A DAY AS NEEDED (Patient taking differently: Take 60 mg by mouth. DAILY) 270 tablet 1  . rosuvastatin (CRESTOR) 20 MG tablet TAKE 1 TABLET BY MOUTH EVERY DAY 90 tablet 3  . topiramate (TOPAMAX) 100 MG tablet Take 1.5 tablets (150 mg total) by mouth daily. 150 MG AM AND 200 MG PM ,BEING PRESCRIBED PER NEUROLOGY 135 tablet 1  . Ubrogepant 100 MG TABS Take by mouth.    . vitamin B-12 (CYANOCOBALAMIN) 1000 MCG tablet Take 1,000 mcg by mouth daily.    Marland Kitchen buPROPion (WELLBUTRIN SR) 100 MG 12 hr tablet Take 1 tablet (100 mg total) by mouth 2 (two) times daily. 180 tablet 0  . DULoxetine (CYMBALTA) 60 MG capsule Take 1 capsule (60 mg total) by mouth 2 (two) times daily. 180 capsule 0  . etodolac (LODINE) 400 MG tablet Take by mouth.    . Suvorexant (BELSOMRA) 10 MG TABS Take 10 mg by mouth at bedtime. 30 tablet 1   No current facility-administered medications for this visit.     Musculoskeletal: Strength & Muscle Tone: UTA Gait & Station: normal Patient leans: N/A  Psychiatric Specialty Exam: Review of Systems  Psychiatric/Behavioral: Positive for dysphoric mood and sleep disturbance. The patient is nervous/anxious.   All other systems reviewed and are negative.   Blood pressure 135/85, pulse 99, temperature (!) 97.1 F (36.2 C), temperature source Temporal, weight 262 lb 12.8 oz (119.2 kg).Body mass index is 41.78 kg/m.  General Appearance: Casual   Eye Contact:  Fair  Speech:  Clear and Coherent  Volume:  Normal  Mood:  Anxious and Depressed improving  Affect:  Congruent  Thought Process:  Goal Directed and Descriptions of Associations: Intact  Orientation:  Full (Time, Place, and Person)  Thought Content: Logical   Suicidal Thoughts:  No  Homicidal Thoughts:  No  Memory:  Immediate;   Fair Recent;   Fair Remote;   Fair  Judgement:  Fair  Insight:  Fair  Psychomotor Activity:  Normal  Concentration:  Concentration: Fair and Attention Span: Fair  Recall:  [employer] of Knowledge: Fair  Language: Fair  Akathisia:  No  Handed:  Right  AIMS (if indicated): done  Assets:  Communication Skills Desire for Improvement Housing Social Support  ADL's:  Intact  Cognition: WNL  Sleep:  improving   Screenings: AUDIT   Flowsheet Row Office Visit from 05/22/2016 in [facility]  Alcohol Use Disorder Identification Test Final Score (AUDIT) 0    GAD-7   Flowsheet Row Counselor from 10/06/2020 in [facility] Office Visit from 08/10/2019 in [facility]  Total GAD-7 Score 15 14    PHQ2-9   [facility] Video Visit from 02/26/2021 in [facility] Counselor from 02/22/2021 in [facility] Counselor from 10/06/2020 in [facility] Visit from 08/10/2019 in [facility] Visit from 05/22/2016 in [facility]  PHQ-2 Total Score 2 4 4 3 4   PHQ-9 Total Score 12 14 19 15 18     Flowsheet Row Video Visit from 02/26/2021 in [facility] Counselor from 02/22/2021 in [facility] No Risk No Risk       Assessment and Plan: Achsah Mcquade is a 50 year old Caucasian female who has a history  of MDD, panic attacks, tobacco use disorder, caffeine use disorder, insomnia was evaluated  by telemedicine today.  Patient with psychosocial stressors including health problems of her family member, medical problems, current pandemic, financial problems as well as son who is planning to relocate.  Patient will continue to benefit from psychotherapy sessions.  She is currently scheduled for prolonged EEG, will await that prior to making more medication changes.  Plan as noted below.  Plan MDD-improving Wellbutrin 100 mg p.o. twice daily Cymbalta 60 mg p.o. twice daily Continue CBT with Ms. Zadie Rhine Patient today reports she is no longer on the Seroquel, she had reported last visit that she was taking Seroquel 50 mg at bedtime.  Panic attacks-improving Gabapentin 600 mg at bedtime-prescribed by neurology Cymbalta 60 mg p.o. twice daily Continue CBT  Agoraphobia -stable Continue CBT  Insomnia-improving Recently diagnosed with obstructive sleep apnea, pending CPAP Continue Belsomra 10 mg p.o. nightly  Tobacco use disorder-unstable Provided counseling. We will consider restarting Chantix however will await EEG report.  This was discussed with patient  Caffeine use disorder-improving Provided counseling  Follow-up in clinic in 8 weeks or sooner if needed.  This note was generated in part or whole with voice recognition software. Voice recognition is usually quite accurate but there are transcription errors that can and very often do occur. I apologize for any typographical errors that were not detected and corrected.      Ursula Alert, MD 03/28/2021, 8:01 AM

## 2021-03-29 ENCOUNTER — Ambulatory Visit: Payer: Medicare HMO | Admitting: Family Medicine

## 2021-03-30 ENCOUNTER — Ambulatory Visit (INDEPENDENT_AMBULATORY_CARE_PROVIDER_SITE_OTHER): Payer: Medicare HMO | Admitting: Family Medicine

## 2021-03-30 ENCOUNTER — Other Ambulatory Visit: Payer: Self-pay

## 2021-03-30 ENCOUNTER — Encounter: Payer: Self-pay | Admitting: Family Medicine

## 2021-03-30 VITALS — BP 151/89 | HR 72 | Ht 66.5 in | Wt 262.7 lb

## 2021-03-30 DIAGNOSIS — R7303 Prediabetes: Secondary | ICD-10-CM | POA: Diagnosis not present

## 2021-03-30 DIAGNOSIS — G8929 Other chronic pain: Secondary | ICD-10-CM

## 2021-03-30 DIAGNOSIS — M25562 Pain in left knee: Secondary | ICD-10-CM

## 2021-03-30 LAB — POCT GLYCOSYLATED HEMOGLOBIN (HGB A1C): HbA1c POC (<> result, manual entry): 5.2 % (ref 4.0–5.6)

## 2021-03-30 LAB — GLUCOSE, POCT (MANUAL RESULT ENTRY): POC Glucose: 107 mg/dl — AB (ref 70–99)

## 2021-03-30 MED ORDER — PHENTERMINE HCL 15 MG PO CAPS: 15.0000 mg | ORAL_CAPSULE | ORAL | 0 refills | Status: DC

## 2021-03-30 NOTE — Assessment & Plan Note (Signed)
Patient with left knee pain x 3 months at least worsening x 3 months, worse going upstairs, has had a gradual onset.

## 2021-03-30 NOTE — Progress Notes (Signed)
Established Patient Office Visit  SUBJECTIVE:  Subjective  Patient ID: Mary Koch, female    DOB: 1971/11/20  Age: 50 y.o. MRN: [id number]  CC:  Chief Complaint  Patient presents with  . Knee Pain    Patient having bilateral knee pain that started several months ago. She states that she can hear them crack and pop.  . Diabetes    HPI Mary Koch is a 50 y.o. female presenting today for     Past Medical History:  Diagnosis Date  . Allergic rhinitis   . Anemia   . Anxiety   . Anxiety and depression   . Carpal tunnel syndrome   . COVID-19 01/15/2020  . Depression   . Fibroids   . GERD (gastroesophageal reflux disease)   . Headache(784.0)   . Headache(784.0)   . Heavy periods   . Hyperthyroidism    right portion of thyroid removed  . Muscle spasm   . Neurological disorder    evaluation for ms  . Painful menstrual periods   . Shortness of breath    when i smoke a lot    Past Surgical History:  Procedure Laterality Date  . BREAST BIOPSY Left    neg  . carpel tunnel Left 2017  . CESAREAN SECTION     times 2  . CHOLECYSTECTOMY    . THYROID LOBECTOMY      Family History  Problem Relation Age of Onset  . Osteoporosis Mother   . Breast cancer Mother 70  . Diabetes Father   . Anxiety disorder Father   . Depression Father   . Post-traumatic stress disorder Father   . Heart disease Father   . Anxiety disorder Sister   . Alcohol abuse Brother   . Colon cancer Neg Hx   . Ovarian cancer Neg Hx     Social History   Socioeconomic History  . Marital status: Legally Separated    Spouse name: Not on file  . Number of children: 2  . Years of education: Not on file  . Highest education level: High school graduate  Occupational History  . Not on file  Tobacco Use  . Smoking status: Current Every Day Smoker    Packs/day: 0.50    Years: 25.00    Pack years: 12.50    Types: Cigarettes    Start date: 05/22/1986  . Smokeless tobacco: Never Used  Vaping  Use  . Vaping Use: Never used  Substance and Sexual Activity  . Alcohol use: No    Alcohol/week: 0.0 standard drinks  . Drug use: No  . Sexual activity: Yes    Partners: Male    Birth control/protection: Surgical, Injection  Other Topics Concern  . Not on file  Social History Narrative  . Not on file   Social Determinants of Health   Financial Resource Strain: Not on file  Food Insecurity: Not on file  Transportation Needs: Not on file  Physical Activity: Not on file  Stress: Not on file  Social Connections: Not on file  Intimate Partner Violence: Not on file     Current Outpatient Medications:  .  albuterol (VENTOLIN HFA) 108 (90 Base) MCG/ACT inhaler, INHALE 1 TO 2 PUFFS BY MOUTH DAILY, Disp: 6.7 each, Rfl: 5 .  BREO ELLIPTA 100-25 MCG/INH AEPB, Inhale 1 puff into the lungs daily., Disp: 28 each, Rfl: 4 .  buPROPion (WELLBUTRIN SR) 100 MG 12 hr tablet, Take 1 tablet (100 mg total) by mouth 2 (two)  times daily., Disp: 180 tablet, Rfl: 0 .  cyclobenzaprine (FLEXERIL) 10 MG tablet, Take 10 mg by mouth 3 (three) times daily as needed for muscle spasms., Disp: , Rfl:  .  DULoxetine (CYMBALTA) 60 MG capsule, Take 1 capsule (60 mg total) by mouth 2 (two) times daily., Disp: 180 capsule, Rfl: 0 .  ergocalciferol (VITAMIN D2) 1.25 MG (50000 UT) capsule, Take 1 capsule by mouth once a week. TAKES DIFFERENTLY - 1000 MG, Disp: , Rfl:  .  etodolac (LODINE) 400 MG tablet, Take by mouth., Disp: , Rfl:  .  furosemide (LASIX) 20 MG tablet, TAKE 1 TABLET BY MOUTH TWICE A WEEK, Disp: , Rfl: 5 .  gabapentin (NEURONTIN) 600 MG tablet, Take 1 tablet (600 mg total) by mouth 2 (two) times daily. (Patient taking differently: Take 600 mg by mouth at bedtime.), Disp: 180 tablet, Rfl: 1 .  meclizine (ANTIVERT) 12.5 MG tablet, , Disp: , Rfl:  .  medroxyPROGESTERone (DEPO-PROVERA) 150 MG/ML injection, , Disp: , Rfl:  .  meloxicam (MOBIC) 7.5 MG tablet, TAKE 1 TABLET BY MOUTH EVERY DAY, Disp: 30 tablet,  Rfl: 3 .  omeprazole (PRILOSEC) 40 MG capsule, Take 1 capsule (40 mg total) by mouth daily., Disp: 90 capsule, Rfl: 3 .  propranolol (INDERAL) 10 MG tablet, TAKE 1 TABLET BY MOUTH THREE TIMES A DAY AS NEEDED (Patient taking differently: Take 60 mg by mouth. DAILY), Disp: 270 tablet, Rfl: 1 .  rosuvastatin (CRESTOR) 20 MG tablet, TAKE 1 TABLET BY MOUTH EVERY DAY, Disp: 90 tablet, Rfl: 3 .  Suvorexant (BELSOMRA) 10 MG TABS, Take 10 mg by mouth at bedtime., Disp: 30 tablet, Rfl: 1 .  topiramate (TOPAMAX) 100 MG tablet, Take 1.5 tablets (150 mg total) by mouth daily. 150 MG AM AND 200 MG PM ,BEING PRESCRIBED PER NEUROLOGY, Disp: 135 tablet, Rfl: 1 .  Ubrogepant 100 MG TABS, Take by mouth., Disp: , Rfl:  .  vitamin B-12 (CYANOCOBALAMIN) 1000 MCG tablet, Take 1,000 mcg by mouth daily., Disp: , Rfl:    Allergies  Allergen Reactions  . Azithromycin Other (See Comments)    headache  . Codeine Other (See Comments)    unknown  . Penicillins Other (See Comments)    headaches    ROS Review of Systems  Constitutional: Negative.   HENT: Negative.   Respiratory: Negative.   Cardiovascular: Negative.   Musculoskeletal: Positive for arthralgias. Negative for back pain.  Skin: Negative.   Psychiatric/Behavioral: Negative for suicidal ideas. The patient is not nervous/anxious.      OBJECTIVE:    Physical Exam Vitals and nursing note reviewed.  Constitutional:      Appearance: She is obese.  HENT:     Head: Normocephalic.     Nose: Nose normal.  Cardiovascular:     Rate and Rhythm: Normal rate.  Abdominal:     General: Bowel sounds are normal.  Musculoskeletal:        General: Tenderness present.     Left knee: No erythema or lacerations. Decreased range of motion. No tenderness.  Neurological:     General: No focal deficit present.  Psychiatric:        Mood and Affect: Mood normal.      BP (!) 151/89   Pulse 72   Ht 5' 6.5" (1.689 m)   Wt 262 lb 11.2 oz (119.2 kg)   BMI 41.77  kg/m  Wt Readings from Last 3 Encounters:  03/30/21 262 lb 11.2 oz (119.2 kg)  10/23/20 267  lb 9.6 oz (121.4 kg)  08/10/20 267 lb (121.1 kg)    Health Maintenance Due  Topic Date Due  . Hepatitis C Screening  Never done  . PNEUMOCOCCAL POLYSACCHARIDE VACCINE AGE 12-64 HIGH RISK  Never done  . COVID-19 Vaccine (1) Never done  . FOOT EXAM  Never done  . OPHTHALMOLOGY EXAM  Never done  . URINE MICROALBUMIN  Never done  . HIV Screening  Never done  . COLONOSCOPY (Pts 45-83yrs Insurance coverage will need to be confirmed)  Never done  . PAP SMEAR-Modifier  10/30/2020    There are no preventive care reminders to display for this patient.  CBC Latest Ref Rng & Units 05/18/2017 11/11/2012 02/05/2012  WBC 3.6 - 11.0 K/uL 12.8(H) 9.7 11.5(H)  Hemoglobin 12.0 - 16.0 g/dL 13.9 14.3 13.6  Hematocrit 35.0 - 47.0 % 40.8 42.0 41.3  Platelets 150 - 440 K/uL 251 253 295   CMP Latest Ref Rng & Units 05/18/2017  Glucose 65 - 99 mg/dL 110(H)  BUN 6 - 20 mg/dL 15  Creatinine 0.44 - 1.00 mg/dL 0.85  Sodium 135 - 145 mmol/L 138  Potassium 3.5 - 5.1 mmol/L 4.5  Chloride 101 - 111 mmol/L 108  CO2 22 - 32 mmol/L 23  Calcium 8.9 - 10.3 mg/dL 9.2  Total Protein 6.5 - 8.1 g/dL 7.3  Total Bilirubin 0.3 - 1.2 mg/dL 0.7  Alkaline Phos 38 - 126 U/L 66  AST 15 - 41 U/L 24  ALT 14 - 54 U/L 18    No results found for: TSH Lab Results  Component Value Date   ALBUMIN 3.7 05/18/2017   ANIONGAP 7 05/18/2017   No results found for: CHOL, HDL, LDLCALC, CHOLHDL No results found for: TRIG Lab Results  Component Value Date   HGBA1C 5.2 03/30/2021      ASSESSMENT & PLAN:   Problem List Items Addressed This Visit      Other   Prediabetes - Primary   Relevant Orders   POCT glucose (manual entry) (Completed)   POCT HgB A1C (Completed)   Chronic pain of left knee    Patient with left knee pain x 3 months at least worsening x 3 months, worse going upstairs, has had a gradual onset.          No  orders of the defined types were placed in this encounter.  Joint Injection/Arthrocentesis  Date/Time: 03/30/2021 3:05 PM Performed by: Beckie Salts, FNP Authorized by: Beckie Salts, FNP  Indications: pain  Body area: knee Joint: left knee Local anesthesia used: no  Anesthesia: Local anesthesia used: no  Sedation: Patient sedated: no  Needle size: 22 G Ultrasound guidance: no Approach: lateral Betamethasone amount: 40 mg Lidocaine 1% amount: 2 mL Patient tolerance: patient tolerated the procedure well with no immediate complications     Follow-up: No follow-ups on file.    Beckie Salts, [facility] [address], [place], [place] [address]

## 2021-03-30 NOTE — Assessment & Plan Note (Signed)
Patient has been trying to diet and is requesting short course of phentermine. I have agreed with a 2 month course. She is to speak with her Psychiatrist about the medication and fu 1 month.

## 2021-04-06 ENCOUNTER — Encounter: Payer: Self-pay | Admitting: Licensed Clinical Social Worker

## 2021-04-06 ENCOUNTER — Other Ambulatory Visit: Payer: Self-pay

## 2021-04-06 ENCOUNTER — Ambulatory Visit (INDEPENDENT_AMBULATORY_CARE_PROVIDER_SITE_OTHER): Payer: Medicare HMO | Admitting: Licensed Clinical Social Worker

## 2021-04-06 DIAGNOSIS — F3342 Major depressive disorder, recurrent, in full remission: Secondary | ICD-10-CM

## 2021-04-06 DIAGNOSIS — F17200 Nicotine dependence, unspecified, uncomplicated: Secondary | ICD-10-CM

## 2021-04-06 DIAGNOSIS — F4000 Agoraphobia, unspecified: Secondary | ICD-10-CM

## 2021-04-06 DIAGNOSIS — F410 Panic disorder [episodic paroxysmal anxiety] without agoraphobia: Secondary | ICD-10-CM | POA: Diagnosis not present

## 2021-04-06 DIAGNOSIS — F99 Mental disorder, not otherwise specified: Secondary | ICD-10-CM | POA: Diagnosis not present

## 2021-04-06 DIAGNOSIS — F5105 Insomnia due to other mental disorder: Secondary | ICD-10-CM

## 2021-04-06 NOTE — Progress Notes (Signed)
Virtual Visit via Video Note  I connected with Mary Koch on 04/06/21 at 11:00 AM EDT by a video enabled telemedicine application and verified that I am speaking with the correct person using two identifiers.   Participating Parties Patient Provider  Location: Patient: Home Provider: Home Office   I discussed the limitations of evaluation and management by telemedicine and the availability of in person appointments. The patient expressed understanding and agreed to proceed.  THERAPY PROGRESS NOTE  Session Time: 20 Minutes  Participation Level: Active  Behavioral Response: CasualAlertAnxious  Type of Therapy: Individual Therapy  Treatment Goals addressed: Anxiety and Coping  Interventions: CBT  Summary: Mary Koch is a 50 y.o. female who presents with anxiety and depression sxs. Pt reported she decided it would be best to not pursue working at her local [employer] at this time due to issues with chronic pain and intensity of anxiety leaving the home. Pt recognized how big a step she took in attempting to overcome fears. Pt reported she has left the home several times since last session, including seeing her psychiatrist for in-person visit. Pt reported "even though I rather have stayed home with my kids and pets" she was able to complete these exposures and manage her anxiety. Pt reported no other concerns at this time.  Suicidal/Homicidal: No  Therapist Response: Therapist met with patient for follow up. Therapist and patient reviewed social exposure successes. Therapist acknowledged patient for steps she has taken outside of her comfort zone and encouraged continuing with small steps following the fear hierarchy. Pt was receptive.  Plan: Return again in 2 weeks.  Diagnosis: Axis I: Panic Disorder and Agoraphobia, MDD In Remission, Tobacco Use, Insomnia    Axis II: N/A  Josephine Igo, LCSW, LCAS 04/06/2021

## 2021-04-16 ENCOUNTER — Other Ambulatory Visit: Payer: Self-pay

## 2021-04-16 ENCOUNTER — Ambulatory Visit (INDEPENDENT_AMBULATORY_CARE_PROVIDER_SITE_OTHER): Payer: Medicare HMO | Admitting: Surgical

## 2021-04-16 DIAGNOSIS — Z3042 Encounter for surveillance of injectable contraceptive: Secondary | ICD-10-CM

## 2021-04-16 MED ORDER — MEDROXYPROGESTERONE ACETATE 150 MG/ML IM SUSP
150.0000 mg | Freq: Once | INTRAMUSCULAR | Status: AC
  Administered 2021-04-16: 150 mg via INTRAMUSCULAR

## 2021-04-16 NOTE — Progress Notes (Signed)
Date last pap: 2018 Last Depo-Provera: 01/29/2021 Side Effects if any: none Serum HCG indicated? NA Depo-Provera 150 mg IM given by: [facility] CMA  Next appointment due July 4-July 18  Patient had breakthrough bleeding last month. I advised if this continues to call and schedule appointment with [facility].

## 2021-04-20 ENCOUNTER — Other Ambulatory Visit: Payer: Self-pay

## 2021-04-20 ENCOUNTER — Encounter: Payer: Self-pay | Admitting: Licensed Clinical Social Worker

## 2021-04-20 ENCOUNTER — Ambulatory Visit (INDEPENDENT_AMBULATORY_CARE_PROVIDER_SITE_OTHER): Payer: Medicare HMO | Admitting: Licensed Clinical Social Worker

## 2021-04-20 DIAGNOSIS — F17200 Nicotine dependence, unspecified, uncomplicated: Secondary | ICD-10-CM

## 2021-04-20 DIAGNOSIS — F3342 Major depressive disorder, recurrent, in full remission: Secondary | ICD-10-CM

## 2021-04-20 DIAGNOSIS — F4000 Agoraphobia, unspecified: Secondary | ICD-10-CM | POA: Diagnosis not present

## 2021-04-20 DIAGNOSIS — F99 Mental disorder, not otherwise specified: Secondary | ICD-10-CM

## 2021-04-20 DIAGNOSIS — F5105 Insomnia due to other mental disorder: Secondary | ICD-10-CM | POA: Diagnosis not present

## 2021-04-20 DIAGNOSIS — F410 Panic disorder [episodic paroxysmal anxiety] without agoraphobia: Secondary | ICD-10-CM

## 2021-04-20 NOTE — Progress Notes (Signed)
Virtual Visit via Video Note  I connected with Mary Koch on 04/20/21 at  8:00 AM EDT by a video enabled telemedicine application and verified that I am speaking with the correct person using two identifiers.  Participating Parties Patient Provider  Location: Patient: Home Provider: Home Office   I discussed the limitations of evaluation and management by telemedicine and the availability of in person appointments. The patient expressed understanding and agreed to proceed.  THERAPY PROGRESS NOTE  Session Time: 20 Minutes  Participation Level: Active  Behavioral Response: CasualAlertEuthymic  Type of Therapy: Individual Therapy  Treatment Goals addressed: Coping  Interventions: Supportive  Summary: Mary Koch is a 50 y.o. female who presents with mild anxiety and depression sxs. Pt reported "I have been alright" since last session. Pt reported that she spent time with extended family for her son's birthday and visited her father's [employer]. Pt reported "my sleep is up and down" and is scheduled to see her doctor about getting new sleep apnea machine on 05/17/21. Pt reported she has been helping care for her ex-husband since his back surgery. Pt reported no other concerns or questions at this time.  Suicidal/Homicidal: No  Therapist Response: Therapist met with patient for follow up. Therapist and patient reviewed sxs and efforts to be more social around family. Therapist and patient discussed options for continued care as this therapist is leaving practice this afternoon.  Plan: Return again as needed to resume therapy with new therapist. Follow up with psychiatrist.  Diagnosis: Axis I: Panic Disorder and Agoraphobia, MDD In Remission, Tobacco Use, Insomnia    Axis II: N/A  Josephine Igo, LCSW, LCAS 04/20/2021

## 2021-04-26 ENCOUNTER — Other Ambulatory Visit: Payer: Self-pay | Admitting: Family Medicine

## 2021-04-27 ENCOUNTER — Ambulatory Visit (INDEPENDENT_AMBULATORY_CARE_PROVIDER_SITE_OTHER): Payer: Medicare HMO | Admitting: Internal Medicine

## 2021-04-27 ENCOUNTER — Encounter: Payer: Self-pay | Admitting: Internal Medicine

## 2021-04-27 ENCOUNTER — Other Ambulatory Visit: Payer: Self-pay

## 2021-04-27 VITALS — BP 138/86 | HR 94 | Ht 66.5 in | Wt 253.5 lb

## 2021-04-27 DIAGNOSIS — E66813 Obesity, class 3: Secondary | ICD-10-CM

## 2021-04-27 DIAGNOSIS — Z6841 Body Mass Index (BMI) 40.0 and over, adult: Secondary | ICD-10-CM

## 2021-04-27 MED ORDER — PHENTERMINE HCL 15 MG PO CAPS: 15.0000 mg | ORAL_CAPSULE | Freq: Every morning | ORAL | 0 refills | Status: DC

## 2021-04-27 NOTE — Progress Notes (Signed)
Established Patient Office Visit  Subjective:  Patient ID: Mary Koch, female    DOB: 1971/01/19  Age: 50 y.o. MRN: [id number]  CC:  Chief Complaint  Patient presents with  . Medication Refill    HPI  Mary Koch presents for her medicine refill.  She got a refill and left without being seen in the office.  She has an urgent meeting to attend.  She reported she could not wait any longer.  So note could not be completed.  Past Medical History:  Diagnosis Date  . Allergic rhinitis   . Anemia   . Anxiety   . Anxiety and depression   . Carpal tunnel syndrome   . COVID-19 01/15/2020  . Depression   . Fibroids   . GERD (gastroesophageal reflux disease)   . Headache(784.0)   . Headache(784.0)   . Heavy periods   . Hyperthyroidism    right portion of thyroid removed  . Muscle spasm   . Neurological disorder    evaluation for ms  . Painful menstrual periods   . Shortness of breath    when i smoke a lot    Past Surgical History:  Procedure Laterality Date  . BREAST BIOPSY Left    neg  . carpel tunnel Left 2017  . CESAREAN SECTION     times 2  . CHOLECYSTECTOMY    . THYROID LOBECTOMY      Family History  Problem Relation Age of Onset  . Osteoporosis Mother   . Breast cancer Mother 66  . Diabetes Father   . Anxiety disorder Father   . Depression Father   . Post-traumatic stress disorder Father   . Heart disease Father   . Anxiety disorder Sister   . Alcohol abuse Brother   . Colon cancer Neg Hx   . Ovarian cancer Neg Hx     Social History   Socioeconomic History  . Marital status: Legally Separated    Spouse name: Not on file  . Number of children: 2  . Years of education: Not on file  . Highest education level: High school graduate  Occupational History  . Not on file  Tobacco Use  . Smoking status: Current Every Day Smoker    Packs/day: 0.50    Years: 25.00    Pack years: 12.50    Types: Cigarettes    Start date: 05/22/1986  . Smokeless  tobacco: Never Used  Vaping Use  . Vaping Use: Never used  Substance and Sexual Activity  . Alcohol use: No    Alcohol/week: 0.0 standard drinks  . Drug use: No  . Sexual activity: Yes    Partners: Male    Birth control/protection: Surgical, Injection  Other Topics Concern  . Not on file  Social History Narrative  . Not on file   Social Determinants of Health   Financial Resource Strain: Not on file  Food Insecurity: Not on file  Transportation Needs: Not on file  Physical Activity: Not on file  Stress: Not on file  Social Connections: Not on file  Intimate Partner Violence: Not on file     Current Outpatient Medications:  .  albuterol (VENTOLIN HFA) 108 (90 Base) MCG/ACT inhaler, INHALE 1 TO 2 PUFFS BY MOUTH DAILY, Disp: 6.7 each, Rfl: 5 .  BREO ELLIPTA 100-25 MCG/INH AEPB, Inhale 1 puff into the lungs daily., Disp: 28 each, Rfl: 4 .  buPROPion (WELLBUTRIN SR) 100 MG 12 hr tablet, Take 1 tablet (100 mg total)  by mouth 2 (two) times daily., Disp: 180 tablet, Rfl: 0 .  cyclobenzaprine (FLEXERIL) 10 MG tablet, Take 10 mg by mouth 3 (three) times daily as needed for muscle spasms., Disp: , Rfl:  .  DULoxetine (CYMBALTA) 60 MG capsule, Take 1 capsule (60 mg total) by mouth 2 (two) times daily., Disp: 180 capsule, Rfl: 0 .  ergocalciferol (VITAMIN D2) 1.25 MG (50000 UT) capsule, Take 1 capsule by mouth once a week. TAKES DIFFERENTLY - 1000 MG, Disp: , Rfl:  .  etodolac (LODINE) 400 MG tablet, Take by mouth., Disp: , Rfl:  .  furosemide (LASIX) 20 MG tablet, TAKE 1 TABLET BY MOUTH TWICE A WEEK, Disp: , Rfl: 5 .  gabapentin (NEURONTIN) 600 MG tablet, Take 1 tablet (600 mg total) by mouth 2 (two) times daily. (Patient taking differently: Take 600 mg by mouth at bedtime.), Disp: 180 tablet, Rfl: 1 .  meclizine (ANTIVERT) 12.5 MG tablet, , Disp: , Rfl:  .  medroxyPROGESTERone (DEPO-PROVERA) 150 MG/ML injection, , Disp: , Rfl:  .  meloxicam (MOBIC) 7.5 MG tablet, TAKE 1 TABLET BY MOUTH  EVERY DAY, Disp: 30 tablet, Rfl: 3 .  omeprazole (PRILOSEC) 40 MG capsule, Take 1 capsule (40 mg total) by mouth daily., Disp: 90 capsule, Rfl: 3 .  propranolol (INDERAL) 10 MG tablet, TAKE 1 TABLET BY MOUTH THREE TIMES A DAY AS NEEDED (Patient taking differently: Take 60 mg by mouth. DAILY), Disp: 270 tablet, Rfl: 1 .  rosuvastatin (CRESTOR) 20 MG tablet, TAKE 1 TABLET BY MOUTH EVERY DAY, Disp: 90 tablet, Rfl: 3 .  Suvorexant (BELSOMRA) 10 MG TABS, Take 10 mg by mouth at bedtime., Disp: 30 tablet, Rfl: 1 .  topiramate (TOPAMAX) 100 MG tablet, Take 1.5 tablets (150 mg total) by mouth daily. 150 MG AM AND 200 MG PM ,BEING PRESCRIBED PER NEUROLOGY, Disp: 135 tablet, Rfl: 1 .  Ubrogepant 100 MG TABS, Take by mouth., Disp: , Rfl:  .  vitamin B-12 (CYANOCOBALAMIN) 1000 MCG tablet, Take 1,000 mcg by mouth daily., Disp: , Rfl:  .  phentermine 15 MG capsule, Take 1 capsule (15 mg total) by mouth every morning., Disp: 30 capsule, Rfl: 0   Allergies  Allergen Reactions  . Azithromycin Other (See Comments)    headache  . Codeine Other (See Comments)    unknown  . Penicillins Other (See Comments)    headaches    ROS Review of Systems    Objective:    Physical Exam  BP 138/86   Pulse 94   Ht 5' 6.5" (1.689 m)   Wt 253 lb 8 oz (115 kg)   BMI 40.30 kg/m  Wt Readings from Last 3 Encounters:  04/27/21 253 lb 8 oz (115 kg)  03/30/21 262 lb 11.2 oz (119.2 kg)  10/23/20 267 lb 9.6 oz (121.4 kg)     Health Maintenance Due  Topic Date Due  . Hepatitis C Screening  Never done  . PNEUMOCOCCAL POLYSACCHARIDE VACCINE AGE 58-64 HIGH RISK  Never done  . COVID-19 Vaccine (1) Never done  . FOOT EXAM  Never done  . OPHTHALMOLOGY EXAM  Never done  . URINE MICROALBUMIN  Never done  . HIV Screening  Never done  . COLONOSCOPY (Pts 45-33yrs Insurance coverage will need to be confirmed)  Never done  . PAP SMEAR-Modifier  10/30/2020    There are no preventive care reminders to display for this  patient.  No results found for: TSH Lab Results  Component Value Date  WBC 12.8 (H) 05/18/2017   HGB 13.9 05/18/2017   HCT 40.8 05/18/2017   MCV 83.2 05/18/2017   PLT 251 05/18/2017   Lab Results  Component Value Date   NA 138 05/18/2017   K 4.5 05/18/2017   CO2 23 05/18/2017   GLUCOSE 110 (H) 05/18/2017   BUN 15 05/18/2017   CREATININE 0.85 05/18/2017   BILITOT 0.7 05/18/2017   ALKPHOS 66 05/18/2017   AST 24 05/18/2017   ALT 18 05/18/2017   PROT 7.3 05/18/2017   ALBUMIN 3.7 05/18/2017   CALCIUM 9.2 05/18/2017   ANIONGAP 7 05/18/2017   No results found for: CHOL No results found for: HDL No results found for: LDLCALC No results found for: TRIG No results found for: CHOLHDL Lab Results  Component Value Date   HGBA1C 5.2 03/30/2021      Assessment & Plan:   Problem List Items Addressed This Visit   None     Meds ordered this encounter  Medications  . phentermine 15 MG capsule    Sig: Take 1 capsule (15 mg total) by mouth every morning.    Dispense:  30 capsule    Refill:  0    Not to exceed 5 additional fills before 09/26/2021    Follow-up: No follow-ups on file.    Cletis Athens, MD

## 2021-04-30 ENCOUNTER — Ambulatory Visit: Payer: Medicare HMO | Admitting: Internal Medicine

## 2021-05-01 ENCOUNTER — Telehealth: Payer: Self-pay | Admitting: Certified Nurse Midwife

## 2021-05-01 NOTE — Telephone Encounter (Signed)
New Message:  Pt states that she has been doing the depo for 12 years and never spotted or had a cycle.  In April she had spotting and light bleeding. She stated that she is now having a light cycle again.

## 2021-05-04 NOTE — Telephone Encounter (Signed)
Ask patient to schedule appointment to discuss. Thanks, JML

## 2021-05-07 NOTE — Telephone Encounter (Signed)
Patient called.  Patient aware.  

## 2021-05-07 NOTE — Telephone Encounter (Signed)
Patient is okay to follow up as needed. May schedule an appointment if bleeding happens again. Thanks, JML

## 2021-05-14 ENCOUNTER — Other Ambulatory Visit: Payer: Self-pay

## 2021-05-14 MED ORDER — PHENTERMINE HCL 15 MG PO CAPS
15.0000 mg | ORAL_CAPSULE | Freq: Every morning | ORAL | 0 refills | Status: DC
Start: 2021-05-14 — End: 2021-07-03

## 2021-05-17 DIAGNOSIS — R55 Syncope and collapse: Secondary | ICD-10-CM | POA: Diagnosis not present

## 2021-05-23 ENCOUNTER — Telehealth (INDEPENDENT_AMBULATORY_CARE_PROVIDER_SITE_OTHER): Payer: Medicare HMO | Admitting: Psychiatry

## 2021-05-23 ENCOUNTER — Other Ambulatory Visit: Payer: Self-pay

## 2021-05-23 ENCOUNTER — Encounter: Payer: Self-pay | Admitting: Psychiatry

## 2021-05-23 DIAGNOSIS — F410 Panic disorder [episodic paroxysmal anxiety] without agoraphobia: Secondary | ICD-10-CM | POA: Diagnosis not present

## 2021-05-23 DIAGNOSIS — F5105 Insomnia due to other mental disorder: Secondary | ICD-10-CM

## 2021-05-23 DIAGNOSIS — F3342 Major depressive disorder, recurrent, in full remission: Secondary | ICD-10-CM | POA: Diagnosis not present

## 2021-05-23 DIAGNOSIS — F4000 Agoraphobia, unspecified: Secondary | ICD-10-CM | POA: Diagnosis not present

## 2021-05-23 DIAGNOSIS — F1590 Other stimulant use, unspecified, uncomplicated: Secondary | ICD-10-CM | POA: Diagnosis not present

## 2021-05-23 DIAGNOSIS — F99 Mental disorder, not otherwise specified: Secondary | ICD-10-CM

## 2021-05-23 DIAGNOSIS — F17200 Nicotine dependence, unspecified, uncomplicated: Secondary | ICD-10-CM

## 2021-05-23 MED ORDER — BELSOMRA 10 MG PO TABS: 10.0000 mg | ORAL_TABLET | Freq: Every day | ORAL | 1 refills | Status: DC

## 2021-05-23 NOTE — Progress Notes (Signed)
Virtual Visit via Video Note  I connected with Mary Koch on 05/23/21 at  3:00 PM EDT by a video enabled telemedicine application and verified that I am speaking with the correct person using two identifiers.  Location Provider Location : [facility] Patient Location : Home  Participants: Patient , Provider   I discussed the limitations of evaluation and management by telemedicine and the availability of in person appointments. The patient expressed understanding and agreed to proceed.    I discussed the assessment and treatment plan with the patient. The patient was provided an opportunity to ask questions and all were answered. The patient agreed with the plan and demonstrated an understanding of the instructions.   The patient was advised to call back or seek an in-person evaluation if the symptoms worsen or if the condition fails to improve as anticipated.   Terra Bella MD OP Progress Note  05/23/2021 3:24 PM Mary Koch  MRN:  [id number]  Chief Complaint:  Chief Complaint    Follow-up; Depression     HPI: Mary Koch is a 50 year old Caucasian female who lives in [place], has a history of MDD, panic disorder, agoraphobia, insomnia, tobacco use disorder, caffeine use disorder was evaluated by telemedicine today.  Patient today reports she did struggle with recent anxiety and sadness since she was having some trouble with her new dog that she adopted.  She reports her son adopted this dog from the animal shelter and she thought he was going to be responsible and take care of it however she reports the whole responsibility is on her now.  She has to do everything by herself.  That did make her feel sad and anxious for a couple of days.  She however reports she is currently coping with it better than before.  Patient reports she is compliant on medications.  She reports sleep is overall okay.  Patient denies any suicidality, homicidality or perceptual disturbances.  Patient denies  side effects to medications.  She reports she is trying to cut back on smoking cigarettes .  Patient denies any other concerns today.  Visit Diagnosis:    ICD-10-CM   1. MDD (major depressive disorder), recurrent, in full remission ([place])  F33.42   2. Panic disorder  F41.0   3. Agoraphobia  F40.00   4. Insomnia due to other mental disorder  F51.05 Suvorexant (BELSOMRA) 10 MG TABS   F99   5. Tobacco use disorder  F17.200   6. 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  Past trials of Paxil, Luvox, Klonopin, Zoloft, Seroquel, Wellbutrin, Ambien  Past Medical History:  Past Medical History:  Diagnosis Date  . Allergic rhinitis   . Anemia   . Anxiety   . Anxiety and depression   . Carpal tunnel syndrome   . COVID-19 01/15/2020  . Depression   . Fibroids   . GERD (gastroesophageal reflux disease)   . Headache(784.0)   . Headache(784.0)   . Heavy periods   . Hyperthyroidism    right portion of thyroid removed  . Muscle spasm   . Neurological disorder    evaluation for ms  . Painful menstrual periods   . Shortness of breath    when i smoke a lot    Past Surgical History:  Procedure Laterality Date  . BREAST BIOPSY Left    neg  . carpel tunnel Left 2017  . CESAREAN SECTION  times 2  . CHOLECYSTECTOMY    . THYROID LOBECTOMY      Family Psychiatric History: I have reviewed family psychiatric history from progress note on 03/24/2018  Family History:  Family History  Problem Relation Age of Onset  . Osteoporosis Mother   . Breast cancer Mother 21  . Diabetes Father   . Anxiety disorder Father   . Depression Father   . Post-traumatic stress disorder Father   . Heart disease Father   . Anxiety disorder Sister   . Alcohol abuse Brother   . Colon cancer Neg Hx   . Ovarian cancer Neg Hx     Social History: I have reviewed social history from progress note on  03/24/2018 Social History   Socioeconomic History  . Marital status: Legally Separated    Spouse name: Not on file  . Number of children: 2  . Years of education: Not on file  . Highest education level: High school graduate  Occupational History  . Not on file  Tobacco Use  . Smoking status: Current Every Day Smoker    Packs/day: 0.50    Years: 25.00    Pack years: 12.50    Types: Cigarettes    Start date: 05/22/1986  . Smokeless tobacco: Never Used  Vaping Use  . Vaping Use: Never used  Substance and Sexual Activity  . Alcohol use: No    Alcohol/week: 0.0 standard drinks  . Drug use: No  . Sexual activity: Yes    Partners: Male    Birth control/protection: Surgical, Injection  Other Topics Concern  . Not on file  Social History Narrative  . Not on file   Social Determinants of Health   Financial Resource Strain: Not on file  Food Insecurity: Not on file  Transportation Needs: Not on file  Physical Activity: Not on file  Stress: Not on file  Social Connections: Not on file    Allergies:  Allergies  Allergen Reactions  . Azithromycin Other (See Comments)    headache  . Codeine Other (See Comments)    unknown  . Penicillins Other (See Comments)    headaches    Metabolic Disorder Labs: Lab Results  Component Value Date   HGBA1C 5.2 03/30/2021   No results found for: PROLACTIN No results found for: CHOL, TRIG, HDL, CHOLHDL, VLDL, LDLCALC No results found for: TSH  Therapeutic Level Labs: No results found for: LITHIUM No results found for: VALPROATE No components found for:  CBMZ  Current Medications: Current Outpatient Medications  Medication Sig Dispense Refill  . albuterol (VENTOLIN HFA) 108 (90 Base) MCG/ACT inhaler INHALE 1 TO 2 PUFFS BY MOUTH DAILY 6.7 each 5  . BREO ELLIPTA 100-25 MCG/INH AEPB Inhale 1 puff into the lungs daily. 28 each 4  . buPROPion (WELLBUTRIN SR) 100 MG 12 hr tablet Take 1 tablet (100 mg total) by mouth 2 (two) times daily.  180 tablet 0  . cyclobenzaprine (FLEXERIL) 10 MG tablet Take 10 mg by mouth 3 (three) times daily as needed for muscle spasms.    . DULoxetine (CYMBALTA) 60 MG capsule Take 1 capsule (60 mg total) by mouth 2 (two) times daily. 180 capsule 0  . ergocalciferol (VITAMIN D2) 1.25 MG (50000 UT) capsule Take 1 capsule by mouth once a week. TAKES DIFFERENTLY - 1000 MG    . etodolac (LODINE) 400 MG tablet Take by mouth.    . furosemide (LASIX) 20 MG tablet TAKE 1 TABLET BY MOUTH TWICE A WEEK  5  .  gabapentin (NEURONTIN) 600 MG tablet Take 1 tablet (600 mg total) by mouth 2 (two) times daily. (Patient taking differently: Take 600 mg by mouth at bedtime.) 180 tablet 1  . meclizine (ANTIVERT) 12.5 MG tablet     . medroxyPROGESTERone (DEPO-PROVERA) 150 MG/ML injection     . meloxicam (MOBIC) 7.5 MG tablet TAKE 1 TABLET BY MOUTH EVERY DAY 30 tablet 3  . omeprazole (PRILOSEC) 40 MG capsule Take 1 capsule (40 mg total) by mouth daily. 90 capsule 3  . phentermine 15 MG capsule Take 1 capsule (15 mg total) by mouth every morning. 30 capsule 0  . propranolol (INDERAL) 10 MG tablet TAKE 1 TABLET BY MOUTH THREE TIMES A DAY AS NEEDED (Patient taking differently: Take 60 mg by mouth. DAILY) 270 tablet 1  . rosuvastatin (CRESTOR) 20 MG tablet TAKE 1 TABLET BY MOUTH EVERY DAY 90 tablet 3  . Suvorexant (BELSOMRA) 10 MG TABS Take 10 mg by mouth at bedtime. 30 tablet 1  . topiramate (TOPAMAX) 100 MG tablet Take 1.5 tablets (150 mg total) by mouth daily. 150 MG AM AND 200 MG PM ,BEING PRESCRIBED PER NEUROLOGY 135 tablet 1  . Ubrogepant 100 MG TABS Take by mouth.    . vitamin B-12 (CYANOCOBALAMIN) 1000 MCG tablet Take 1,000 mcg by mouth daily.     No current facility-administered medications for this visit.     Musculoskeletal: Strength & Muscle Tone: UTA Gait & Station: UTA Patient leans: N/A  Psychiatric Specialty Exam: Review of Systems  Psychiatric/Behavioral: The patient is nervous/anxious.   All other  systems reviewed and are negative.   There were no vitals taken for this visit.There is no height or weight on file to calculate BMI.  General Appearance: Casual  Eye Contact:  Fair  Speech:  Clear and Coherent  Volume:  Normal  Mood:  Anxious Coping   Affect:  Appropriate  Thought Process:  Goal Directed and Descriptions of Associations: Intact  Orientation:  Full (Time, Place, and Person)  Thought Content: Logical   Suicidal Thoughts:  No  Homicidal Thoughts:  No  Memory:  Immediate;   Fair Recent;   Fair Remote;   Fair  Judgement:  Fair  Insight:  Fair  Psychomotor Activity:  Normal  Concentration:  Concentration: Fair and Attention Span: Fair  Recall:  [employer] of Knowledge: Fair  Language: Fair  Akathisia:  No  Handed:  Right  AIMS (if indicated): UTA  Assets:  Communication Skills Desire for Improvement Housing Social Support  ADL's:  Intact  Cognition: WNL  Sleep:  Fair   Screenings: AUDIT   Flowsheet Row Office Visit from 05/22/2016 in [facility]  Alcohol Use Disorder Identification Test Final Score (AUDIT) 0    GAD-7   Flowsheet Row Counselor from 10/06/2020 in [facility] Office Visit from 08/10/2019 in [facility]  Total GAD-7 Score 15 14    PHQ2-9   [place] Video Visit from 02/26/2021 in [facility] Counselor from 02/22/2021 in [facility] Counselor from 10/06/2020 in [facility] Office Visit from 08/10/2019 in [facility] Visit from 05/22/2016 in [facility]  PHQ-2 Total Score 2 4 4 3 4   PHQ-9 Total Score 12 14 19 15 18     Flowsheet Row Video Visit from 02/26/2021 in [facility] Counselor from 02/22/2021 in [place] No Risk No Risk  Assessment and Plan: Mary Koch is a 50 year old Caucasian female who has a history of MDD, panic attack, tobacco use disorder, caffeine use disorder, insomnia was evaluated by telemedicine today.  Patient with psychosocial stressors of health problems, having a new dog.  She however is overall doing well.  Plan MDD in remission Wellbutrin 100 mg p.o. daily Cymbalta 60 mg p.o. twice daily Continue CBT   Panic attacks-improving Gabapentin 600 mg at bedtime-prescribed by neurology Cymbalta 60 mg p.o. twice daily Continue CBT  Agoraphobia-stable Continue CBT  Insomnia- improving Patient was recently diagnosed with OSA. Patient to be compliant on CPAP Belsomra 10 mg p.o. nightly  Tobacco use disorder- improving Provided counseling.  Caffeine use disorder-improving Provided counseling  Follow-up in clinic in 1 to 2 months or sooner if needed.  This note was generated in part or whole with voice recognition software. Voice recognition is usually quite accurate but there are transcription errors that can and very often do occur. I apologize for any typographical errors that were not detected and corrected.        Ursula Alert, MD 05/25/2021, 9:00 AM

## 2021-06-24 ENCOUNTER — Other Ambulatory Visit: Payer: Self-pay | Admitting: Internal Medicine

## 2021-06-30 ENCOUNTER — Other Ambulatory Visit: Payer: Self-pay | Admitting: Internal Medicine

## 2021-07-03 ENCOUNTER — Other Ambulatory Visit: Payer: Self-pay | Admitting: Internal Medicine

## 2021-07-03 ENCOUNTER — Other Ambulatory Visit: Payer: Self-pay | Admitting: Certified Nurse Midwife

## 2021-07-03 DIAGNOSIS — N921 Excessive and frequent menstruation with irregular cycle: Secondary | ICD-10-CM

## 2021-07-06 ENCOUNTER — Ambulatory Visit (INDEPENDENT_AMBULATORY_CARE_PROVIDER_SITE_OTHER): Payer: Medicare HMO | Admitting: Surgical

## 2021-07-06 ENCOUNTER — Other Ambulatory Visit: Payer: Self-pay

## 2021-07-06 DIAGNOSIS — Z3042 Encounter for surveillance of injectable contraceptive: Secondary | ICD-10-CM

## 2021-07-06 MED ORDER — MEDROXYPROGESTERONE ACETATE 150 MG/ML IM SUSP
150.0000 mg | Freq: Once | INTRAMUSCULAR | Status: AC
  Administered 2021-07-06: 150 mg via INTRAMUSCULAR

## 2021-07-06 NOTE — Progress Notes (Signed)
Date last pap: 2018 Last Depo-Provera: 04/16/2021 Side Effects if any None Serum HCG indicated?NA Depo-Provera 150 mg IM given by:J. [facility] CMA Next appointment due Sept 23 - Oct 7

## 2021-07-09 ENCOUNTER — Other Ambulatory Visit: Payer: Self-pay

## 2021-07-09 ENCOUNTER — Telehealth (INDEPENDENT_AMBULATORY_CARE_PROVIDER_SITE_OTHER): Payer: Medicare HMO | Admitting: Psychiatry

## 2021-07-09 ENCOUNTER — Encounter: Payer: Self-pay | Admitting: Psychiatry

## 2021-07-09 DIAGNOSIS — F17200 Nicotine dependence, unspecified, uncomplicated: Secondary | ICD-10-CM

## 2021-07-09 DIAGNOSIS — G4701 Insomnia due to medical condition: Secondary | ICD-10-CM | POA: Diagnosis not present

## 2021-07-09 DIAGNOSIS — F3342 Major depressive disorder, recurrent, in full remission: Secondary | ICD-10-CM | POA: Diagnosis not present

## 2021-07-09 DIAGNOSIS — F4000 Agoraphobia, unspecified: Secondary | ICD-10-CM

## 2021-07-09 DIAGNOSIS — F1590 Other stimulant use, unspecified, uncomplicated: Secondary | ICD-10-CM | POA: Diagnosis not present

## 2021-07-09 DIAGNOSIS — F5105 Insomnia due to other mental disorder: Secondary | ICD-10-CM

## 2021-07-09 DIAGNOSIS — F410 Panic disorder [episodic paroxysmal anxiety] without agoraphobia: Secondary | ICD-10-CM | POA: Diagnosis not present

## 2021-07-09 MED ORDER — BELSOMRA 10 MG PO TABS: 10.0000 mg | ORAL_TABLET | Freq: Every day | ORAL | 1 refills | Status: DC

## 2021-07-09 MED ORDER — DULOXETINE HCL 60 MG PO CPEP
60.0000 mg | ORAL_CAPSULE | Freq: Two times a day (BID) | ORAL | 0 refills | Status: DC
Start: 2021-07-09 — End: 2021-09-05

## 2021-07-09 NOTE — Progress Notes (Signed)
Virtual Visit via Video Note  I connected with Mary Koch on 07/09/21 at  4:00 PM EDT by a video enabled telemedicine application and verified that I am speaking with the correct person using two identifiers.  Location Provider Location : [facility] Patient Location : Home  Participants: Patient , Provider   I discussed the limitations of evaluation and management by telemedicine and the availability of in person appointments. The patient expressed understanding and agreed to proceed.    I discussed the assessment and treatment plan with the patient. The patient was provided an opportunity to ask questions and all were answered. The patient agreed with the plan and demonstrated an understanding of the instructions.   The patient was advised to call back or seek an in-person evaluation if the symptoms worsen or if the condition fails to improve as anticipated.  [facility] MD OP Progress Note  07/09/2021 5:11 PM [place] Kristen Fromm  MRN:  [id number]  Chief Complaint:  Chief Complaint   Follow-up; Depression; Anxiety    HPI: Mary Koch is a 50 year old Caucasian female who lives in [place], has a history of MDD, panic disorder, agoraphobia, insomnia, tobacco use disorder, caffeine use disorder was evaluated by telemedicine today.  Patient today reports she is overall coping with her anxiety and depression well.  She denies any significant sadness or mood swings.  She reports sleep is okay.  Patient reports she is currently struggling with smoking cigarettes, it may have increased since the past few months.  She is on Wellbutrin however today reports she may not have been taking it as prescribed.  She has been taking it orally once a day ,would like to go up to twice a day as prescribed previously.  She denies any side effects.  Patient denies any suicidality, homicidality or perceptual disturbances.  She is interested in psychotherapy sessions and agrees to get in touch with her health  insurance plan.  Patient denies any other concerns today.  Visit Diagnosis:    ICD-10-CM   1. MDD (major depressive disorder), recurrent, in full remission ([place])  F33.42     2. Panic disorder  F41.0 DULoxetine (CYMBALTA) 60 MG capsule    3. Agoraphobia  F40.00     4. Insomnia due to medical condition  G47.01 Suvorexant (BELSOMRA) 10 MG TABS   depression, sleep apnea    5. Tobacco use disorder  F17.200     6. Caffeine use disorder  F15.90       Past Psychiatric History: I have reviewed past psychiatric history from progress note on 03/24/2018.  Past trials of Paxil, Luvox, Klonopin, Zoloft, Seroquel, Wellbutrin, Ambien  Past Medical History:  Past Medical History:  Diagnosis Date   Allergic rhinitis    Anemia    Anxiety    Anxiety and depression    Carpal tunnel syndrome    COVID-19 01/15/2020   Depression    Fibroids    GERD (gastroesophageal reflux disease)    Headache(784.0)    Headache(784.0)    Heavy periods    Hyperthyroidism    right portion of thyroid removed   Muscle spasm    Neurological disorder    evaluation for ms   Painful menstrual periods    Shortness of breath    when i smoke a lot    Past Surgical History:  Procedure Laterality Date   BREAST BIOPSY Left    neg   carpel tunnel Left 2017   CESAREAN SECTION     times 2  CHOLECYSTECTOMY     THYROID LOBECTOMY      Family Psychiatric History: Reviewed family psychiatric history from progress note on 03/24/2018  Family History:  Family History  Problem Relation Age of Onset   Osteoporosis Mother    Breast cancer Mother 53   Diabetes Father    Anxiety disorder Father    Depression Father    Post-traumatic stress disorder Father    Heart disease Father    Anxiety disorder Sister    Alcohol abuse Brother    Colon cancer Neg Hx    Ovarian cancer Neg Hx     Social History: Reviewed social history from progress note on 03/24/2018 Social History   Socioeconomic History   Marital status:  Legally Separated    Spouse name: Not on file   Number of children: 2   Years of education: Not on file   Highest education level: High school graduate  Occupational History   Not on file  Tobacco Use   Smoking status: Every Day    Packs/day: 1.00    Years: 25.00    Pack years: 25.00    Types: Cigarettes    Start date: 05/22/1986   Smokeless tobacco: Never  Vaping Use   Vaping Use: Never used  Substance and Sexual Activity   Alcohol use: No    Alcohol/week: 0.0 standard drinks   Drug use: No   Sexual activity: Yes    Partners: Male    Birth control/protection: Surgical, Injection  Other Topics Concern   Not on file  Social History Narrative   Not on file   Social Determinants of Health   Financial Resource Strain: Not on file  Food Insecurity: Not on file  Transportation Needs: Not on file  Physical Activity: Not on file  Stress: Not on file  Social Connections: Not on file    Allergies:  Allergies  Allergen Reactions   Azithromycin Other (See Comments)    headache   Codeine Other (See Comments)    unknown   Penicillins Other (See Comments)    headaches    Metabolic Disorder Labs: Lab Results  Component Value Date   HGBA1C 5.2 03/30/2021   No results found for: PROLACTIN No results found for: CHOL, TRIG, HDL, CHOLHDL, VLDL, LDLCALC No results found for: TSH  Therapeutic Level Labs: No results found for: LITHIUM No results found for: VALPROATE No components found for:  CBMZ  Current Medications: Current Outpatient Medications  Medication Sig Dispense Refill   gabapentin (NEURONTIN) 600 MG tablet Take 1 tablet (600 mg total) by mouth 2 (two) times daily. (Patient taking differently: Take 900 mg by mouth as directed. 300 mg daily and 600 mg at bedtime) 180 tablet 1   albuterol (VENTOLIN HFA) 108 (90 Base) MCG/ACT inhaler INHALE 1 TO 2 PUFFS BY MOUTH DAILY 6.7 each 5   BREO ELLIPTA 100-25 MCG/INH AEPB Inhale 1 puff into the lungs daily. 28 each 4    buPROPion (WELLBUTRIN SR) 100 MG 12 hr tablet Take 1 tablet (100 mg total) by mouth 2 (two) times daily. 180 tablet 0   cyclobenzaprine (FLEXERIL) 10 MG tablet Take 10 mg by mouth 3 (three) times daily as needed for muscle spasms.     DULoxetine (CYMBALTA) 60 MG capsule Take 1 capsule (60 mg total) by mouth 2 (two) times daily. 180 capsule 0   ergocalciferol (VITAMIN D2) 1.25 MG (50000 UT) capsule Take 1 capsule by mouth once a week. TAKES DIFFERENTLY - 1000 MG  etodolac (LODINE) 400 MG tablet Take by mouth.     furosemide (LASIX) 20 MG tablet TAKE 1 TABLET BY MOUTH TWICE A WEEK  5   meclizine (ANTIVERT) 12.5 MG tablet      medroxyPROGESTERone (DEPO-PROVERA) 150 MG/ML injection INJECT 1 ML (150 MG TOTAL) INTO THE MUSCLE EVERY 3 (THREE) MONTHS. 1 mL 3   meloxicam (MOBIC) 7.5 MG tablet TAKE 1 TABLET BY MOUTH EVERY DAY 30 tablet 3   omeprazole (PRILOSEC) 40 MG capsule TAKE 1 CAPSULE BY MOUTH EVERY DAY 90 capsule 3   phentermine 15 MG capsule TAKE 1 CAPSULE BY MOUTH (15MG  TOTAL) EVERY MORNING 30 capsule 0   propranolol (INDERAL) 10 MG tablet TAKE 1 TABLET BY MOUTH THREE TIMES A DAY AS NEEDED (Patient taking differently: Take 60 mg by mouth. DAILY) 270 tablet 1   rosuvastatin (CRESTOR) 20 MG tablet TAKE 1 TABLET BY MOUTH EVERY DAY 90 tablet 3   Suvorexant (BELSOMRA) 10 MG TABS Take 10 mg by mouth at bedtime. 30 tablet 1   topiramate (TOPAMAX) 100 MG tablet Take 1.5 tablets (150 mg total) by mouth daily. 150 MG AM AND 200 MG PM ,BEING PRESCRIBED PER NEUROLOGY 135 tablet 1   Ubrogepant 100 MG TABS Take by mouth.     vitamin B-12 (CYANOCOBALAMIN) 1000 MCG tablet Take 1,000 mcg by mouth daily.     No current facility-administered medications for this visit.     Musculoskeletal: Strength & Muscle Tone:  UTA Gait & Station:  UTA Patient leans: N/A  Psychiatric Specialty Exam: Review of Systems  Psychiatric/Behavioral:  Negative for agitation, behavioral problems, confusion, decreased  concentration, hallucinations, self-injury, sleep disturbance and suicidal ideas. The patient is not nervous/anxious and is not hyperactive.   All other systems reviewed and are negative.  There were no vitals taken for this visit.There is no height or weight on file to calculate BMI.  General Appearance: Casual  Eye Contact:  Good  Speech:  Clear and Coherent  Volume:  Normal  Mood:  Euthymic  Affect:  Congruent  Thought Process:  Goal Directed and Descriptions of Associations: Intact  Orientation:  Full (Time, Place, and Person)  Thought Content: Logical   Suicidal Thoughts:  No  Homicidal Thoughts:  No  Memory:  Immediate;   Fair Recent;   Fair Remote;   Fair  Judgement:  Fair  Insight:  Fair  Psychomotor Activity:  Normal  Concentration:  Concentration: Fair and Attention Span: Fair  Recall:  [employer] of Knowledge: Fair  Language: Fair  Akathisia:  No  Handed:  Right  AIMS (if indicated): not done  Assets:  Communication Skills Desire for Improvement Social Support Talents/Skills Transportation  ADL's:  Intact  Cognition: WNL  Sleep:  Fair   Screenings: AUDIT    Flowsheet Row Office Visit from 05/22/2016 in [facility]  Alcohol Use Disorder Identification Test Final Score (AUDIT) 0      GAD-7    Flowsheet Row Counselor from 10/06/2020 in [facility] Office Visit from 08/10/2019 in [facility]  Total GAD-7 Score 15 14      PHQ2-9    [facility] Video Visit from 07/09/2021 in [facility] Video Visit from 02/26/2021 in [facility] Counselor from 02/22/2021 in [facility] Counselor from 10/06/2020 in [facility] Office Visit from 08/10/2019 in [facility]  PHQ-2 Total Score 0 2 4 4 3   PHQ-9 Total Score -- 12  14 19 15       Flowsheet Row Video Visit from 02/26/2021 in  [facility] Counselor from 02/22/2021 in [facility] No Risk No Risk        Assessment and Plan: Mary Koch is a 50 year old Caucasian female who has a history of MDD, panic attacks, tobacco use disorder, caffeine use disorder, insomnia was evaluated by telemedicine today.  Patient is currently stable with regards to her mood however is interested in smoking cessation.  Plan as noted below.  Plan MDD in remission Cymbalta 60 mg p.o. twice daily Continue Wellbutrin as prescribed Continue CBT, patient to establish care with a new therapist.  She will call her health insurance plan and will reach out to writer.  Panic attacks-improving Gabapentin 900 mg p.o. daily in divided dosage-prescribed by neurology Cymbalta 60 mg p.o. twice daily Continue CBT as needed  Agoraphobia-stable Continue CBT  Insomnia-stable Patient with OSA compliant on CPAP Belsomra 10 mg p.o. nightly  Tobacco use disorder-unstable Provided counseling Advised patient to start taking Wellbutrin SR 100 mg p.o. twice daily.  She was only taking it 100 mg once a day.  Provided education about distraction techniques, finding a hobby, and so on.  Caffeine use disorder-improving Provided counseling  Follow-up in clinic in 1 month or sooner if needed.  This note was generated in part or whole with voice recognition software. Voice recognition is usually quite accurate but there are transcription errors that can and very often do occur. I apologize for any typographical errors that were not detected and corrected.       Ursula Alert, MD 07/10/2021, 8:25 AM

## 2021-07-25 ENCOUNTER — Ambulatory Visit: Payer: Medicare HMO | Admitting: Dermatology

## 2021-08-09 ENCOUNTER — Other Ambulatory Visit: Payer: Self-pay

## 2021-08-09 ENCOUNTER — Telehealth (HOSPITAL_BASED_OUTPATIENT_CLINIC_OR_DEPARTMENT_OTHER): Payer: Self-pay | Admitting: Psychiatry

## 2021-08-09 DIAGNOSIS — Z5329 Procedure and treatment not carried out because of patient's decision for other reasons: Secondary | ICD-10-CM

## 2021-08-09 NOTE — Progress Notes (Signed)
At the time of the appointment when patient did not connect by video writer contacted by phone.  Patient at that time said she was driving.  Discussed with patient that I do not encourage completing this evaluation while she is driving and she could park the car off she could call back to reschedule this appointment.  Patient agrees to call back for an appointment.

## 2021-09-05 ENCOUNTER — Other Ambulatory Visit: Payer: Self-pay

## 2021-09-05 ENCOUNTER — Encounter: Payer: Self-pay | Admitting: Psychiatry

## 2021-09-05 ENCOUNTER — Telehealth (HOSPITAL_BASED_OUTPATIENT_CLINIC_OR_DEPARTMENT_OTHER): Payer: Medicare HMO | Admitting: Psychiatry

## 2021-09-05 DIAGNOSIS — F4000 Agoraphobia, unspecified: Secondary | ICD-10-CM | POA: Diagnosis not present

## 2021-09-05 DIAGNOSIS — Z9189 Other specified personal risk factors, not elsewhere classified: Secondary | ICD-10-CM | POA: Insufficient documentation

## 2021-09-05 DIAGNOSIS — F17200 Nicotine dependence, unspecified, uncomplicated: Secondary | ICD-10-CM | POA: Diagnosis not present

## 2021-09-05 DIAGNOSIS — F332 Major depressive disorder, recurrent severe without psychotic features: Secondary | ICD-10-CM | POA: Diagnosis not present

## 2021-09-05 DIAGNOSIS — G4701 Insomnia due to medical condition: Secondary | ICD-10-CM | POA: Diagnosis not present

## 2021-09-05 DIAGNOSIS — F410 Panic disorder [episodic paroxysmal anxiety] without agoraphobia: Secondary | ICD-10-CM

## 2021-09-05 DIAGNOSIS — F1590 Other stimulant use, unspecified, uncomplicated: Secondary | ICD-10-CM | POA: Diagnosis not present

## 2021-09-05 MED ORDER — DULOXETINE HCL 60 MG PO CPEP: 60.0000 mg | ORAL_CAPSULE | Freq: Every day | ORAL | 0 refills | Status: DC

## 2021-09-05 MED ORDER — DULOXETINE HCL 30 MG PO CPEP
30.0000 mg | ORAL_CAPSULE | Freq: Every day | ORAL | 0 refills | Status: DC
Start: 2021-09-05 — End: 2021-11-28

## 2021-09-05 NOTE — Progress Notes (Signed)
Virtual Visit via Video Note  I connected with Mary Koch on 09/05/21 at  9:00 AM EDT by a video enabled telemedicine application and verified that I am speaking with the correct person using two identifiers.  Location Provider Location : [facility] Patient Location : Home  Participants: Patient , Provider    I discussed the limitations of evaluation and management by telemedicine and the availability of in person appointments. The patient expressed understanding and agreed to proceed.   I discussed the assessment and treatment plan with the patient. The patient was provided an opportunity to ask questions and all were answered. The patient agreed with the plan and demonstrated an understanding of the instructions.   The patient was advised to call back or seek an in-person evaluation if the symptoms worsen or if the condition fails to improve as anticipated.   [facility] MD OP Progress Note  09/05/2021 9:29 AM [place]  MRN:  [id number]  Chief Complaint:  Chief Complaint   Follow-up; Depression; Anxiety    HPI: Mary Koch is a 50 year old Caucasian female who lives in [place], has a history of MDD, panic disorder, agoraphobia, insomnia, tobacco use disorder, caffeine use disorder was evaluated by telemedicine today.  Patient today reports she is currently struggling with depressive symptoms, sadness, low motivation, anhedonia, excessive sleepiness during the day, fatigue, tiredness, low energy, increased appetite, concentration problems.  Patient reports she wakes up in the morning since her children are being home schooled.  Patient reports she goes back to bed at around 10:00 in bed wakes up again after an hour or so.  She does this throughout the day and hence sleeps around 3 to 4 hours during the day.  She also sleeps 8 to 10 hours at night.  This has been getting worse since the past several weeks.  Patient reports she is compliant on medications.  Patient denies any side  effects however unknown if her excessive fatigue and tiredness is also due to being on polypharmacy.  She is currently trying to cut back on smoking cigarettes.  Patient denies any suicidality or homicidality.  Patient denies any perceptual disturbances.  Patient denies any other concerns today.  Visit Diagnosis:    ICD-10-CM   1. Severe episode of recurrent major depressive disorder, without psychotic features ([facility])  F33.2 DULoxetine (CYMBALTA) 30 MG capsule    DULoxetine (CYMBALTA) 60 MG capsule    TSH    2. Panic disorder  F41.0     3. Agoraphobia  F40.00     4. Insomnia due to medical condition  G47.01    Depression, sleep apnea    5. Tobacco use disorder  F17.200     6. 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 At risk for prolonged QT interval syndrome  Z91.89 EKG 12-Lead      Past Psychiatric History: Reviewed past psychiatric history from progress note on 03/24/2018.  Past trials of Paxil, Luvox, Klonopin, Zoloft, Seroquel, Wellbutrin, Ambien  Past Medical History:  Past Medical History:  Diagnosis Date   Allergic rhinitis    Anemia    Anxiety    Anxiety and depression    Carpal tunnel syndrome    COVID-19 01/15/2020   Depression    Fibroids    GERD (gastroesophageal reflux disease)    Headache(784.0)    Headache(784.0)    Heavy periods    Hyperthyroidism    right portion of thyroid removed   Muscle spasm    Neurological  disorder    evaluation for ms   Painful menstrual periods    Shortness of breath    when i smoke a lot    Past Surgical History:  Procedure Laterality Date   BREAST BIOPSY Left    neg   carpel tunnel Left 2017   CESAREAN SECTION     times 2   CHOLECYSTECTOMY     THYROID LOBECTOMY      Family Psychiatric History: Reviewed family psychiatric history from progress note on 03/24/2018.  Family History:  Family History  Problem Relation Age of Onset   Osteoporosis Mother    Breast cancer Mother 36   Diabetes Father    Anxiety  disorder Father    Depression Father    Post-traumatic stress disorder Father    Heart disease Father    Anxiety disorder Sister    Alcohol abuse Brother    Colon cancer Neg Hx    Ovarian cancer Neg Hx     Social History: Reviewed social history from progress note on 03/24/2018. Social History   Socioeconomic History   Marital status: Legally Separated    Spouse name: Not on file   Number of children: 2   Years of education: Not on file   Highest education level: High school graduate  Occupational History   Not on file  Tobacco Use   Smoking status: Every Day    Packs/day: 1.00    Years: 25.00    Pack years: 25.00    Types: Cigarettes    Start date: 05/22/1986   Smokeless tobacco: Never  Vaping Use   Vaping Use: Never used  Substance and Sexual Activity   Alcohol use: No    Alcohol/week: 0.0 standard drinks   Drug use: No   Sexual activity: Yes    Partners: Male    Birth control/protection: Surgical, Injection  Other Topics Concern   Not on file  Social History Narrative   Not on file   Social Determinants of Health   Financial Resource Strain: Not on file  Food Insecurity: Not on file  Transportation Needs: Not on file  Physical Activity: Not on file  Stress: Not on file  Social Connections: Not on file    Allergies:  Allergies  Allergen Reactions   Azithromycin Other (See Comments)    headache   Codeine Other (See Comments)    unknown   Penicillins Other (See Comments)    headaches    Metabolic Disorder Labs: Lab Results  Component Value Date   HGBA1C 5.2 03/30/2021   No results found for: PROLACTIN No results found for: CHOL, TRIG, HDL, CHOLHDL, VLDL, LDLCALC No results found for: TSH  Therapeutic Level Labs: No results found for: LITHIUM No results found for: VALPROATE No components found for:  CBMZ  Current Medications: Current Outpatient Medications  Medication Sig Dispense Refill   DULoxetine (CYMBALTA) 30 MG capsule Take 1 capsule  (30 mg total) by mouth daily. Take along with 60 mg - total of 90 mg daily 90 capsule 0   DULoxetine (CYMBALTA) 60 MG capsule Take 1 capsule (60 mg total) by mouth daily. Take along with 30 mg daily 90 capsule 0   albuterol (VENTOLIN HFA) 108 (90 Base) MCG/ACT inhaler INHALE 1 TO 2 PUFFS BY MOUTH DAILY 6.7 each 5   BREO ELLIPTA 100-25 MCG/INH AEPB Inhale 1 puff into the lungs daily. 28 each 4   buPROPion (WELLBUTRIN SR) 100 MG 12 hr tablet Take 1 tablet (100 mg total) by mouth  2 (two) times daily. 180 tablet 0   cyclobenzaprine (FLEXERIL) 10 MG tablet Take 10 mg by mouth 3 (three) times daily as needed for muscle spasms.     ergocalciferol (VITAMIN D2) 1.25 MG (50000 UT) capsule Take 1 capsule by mouth once a week. TAKES DIFFERENTLY - 1000 MG     etodolac (LODINE) 400 MG tablet Take by mouth.     furosemide (LASIX) 20 MG tablet TAKE 1 TABLET BY MOUTH TWICE A WEEK  5   gabapentin (NEURONTIN) 600 MG tablet Take 1 tablet (600 mg total) by mouth 2 (two) times daily. (Patient taking differently: Take 900 mg by mouth as directed. 300 mg daily and 600 mg at bedtime) 180 tablet 1   meclizine (ANTIVERT) 12.5 MG tablet      medroxyPROGESTERone (DEPO-PROVERA) 150 MG/ML injection INJECT 1 ML (150 MG TOTAL) INTO THE MUSCLE EVERY 3 (THREE) MONTHS. 1 mL 3   meloxicam (MOBIC) 7.5 MG tablet TAKE 1 TABLET BY MOUTH EVERY DAY 30 tablet 3   omeprazole (PRILOSEC) 40 MG capsule TAKE 1 CAPSULE BY MOUTH EVERY DAY 90 capsule 3   phentermine 15 MG capsule TAKE 1 CAPSULE BY MOUTH ('15MG'$  TOTAL) EVERY MORNING 30 capsule 0   propranolol (INDERAL) 10 MG tablet TAKE 1 TABLET BY MOUTH THREE TIMES A DAY AS NEEDED (Patient taking differently: Take 60 mg by mouth. DAILY) 270 tablet 1   rosuvastatin (CRESTOR) 20 MG tablet TAKE 1 TABLET BY MOUTH EVERY DAY 90 tablet 3   Suvorexant (BELSOMRA) 10 MG TABS Take 10 mg by mouth at bedtime. 30 tablet 1   topiramate (TOPAMAX) 100 MG tablet Take 1.5 tablets (150 mg total) by mouth daily. 150 MG AM  AND 200 MG PM ,BEING PRESCRIBED PER NEUROLOGY 135 tablet 1   Ubrogepant 100 MG TABS Take by mouth.     vitamin B-12 (CYANOCOBALAMIN) 1000 MCG tablet Take 1,000 mcg by mouth daily.     No current facility-administered medications for this visit.     Musculoskeletal: Strength & Muscle Tone:  UTA Gait & Station: normal Patient leans: N/A  Psychiatric Specialty Exam: Review of Systems  Constitutional:  Positive for fatigue.  Psychiatric/Behavioral:  Positive for decreased concentration, dysphoric mood and sleep disturbance.   All other systems reviewed and are negative.  There were no vitals taken for this visit.There is no height or weight on file to calculate BMI.  General Appearance: Casual  Eye Contact:  Fair  Speech:  Clear and Coherent  Volume:  Normal  Mood:  Depressed  Affect:  Congruent  Thought Process:  Goal Directed and Descriptions of Associations: Intact  Orientation:  Full (Time, Place, and Person)  Thought Content: Logical   Suicidal Thoughts:  No  Homicidal Thoughts:  No  Memory:  Immediate;   Fair Recent;   Fair Remote;   Fair  Judgement:  Fair  Insight:  Fair  Psychomotor Activity:  Normal  Concentration:  Concentration: Fair and Attention Span: Fair  Recall:  [employer] of Knowledge: Fair  Language: Fair  Akathisia:  No  Handed:  Right  AIMS (if indicated): not done  Assets:  Communication Skills Desire for Improvement Social Support Transportation Vocational/Educational  ADL's:  Intact  Cognition: WNL  Sleep:  excessive   Screenings: AUDIT    Flowsheet Row Office Visit from 05/22/2016 in [facility]  Alcohol Use Disorder Identification Test Final Score (AUDIT) 0      GAD-7    Flowsheet Row Counselor from 10/06/2020  in [facility] Office Visit from 08/10/2019 in [facility]  Total GAD-7 Score 15 14      PHQ2-9    Flowsheet Row Video Visit from 09/05/2021 in [facility] Video Visit from 07/09/2021 in [facility] Video Visit from 02/26/2021 in [facility] Counselor from 02/22/2021 in [facility] Counselor from 10/06/2020 in [facility]  PHQ-2 Total Score 6 0 '2 4 4  '$ PHQ-9 Total Score 24 -- '12 14 19      '$ Flowsheet Row Video Visit from 02/26/2021 in [facility] Counselor from 02/22/2021 in [facility] No Risk No Risk        Assessment and Plan: Tekeyla Barton is a 50 year old Caucasian female who has a history of MDD, panic attacks, tobacco use disorder, caffeine use disorder, insomnia was evaluated by telemedicine today.  Patient is currently struggling with depression, excessive sleepiness, fatigue.  She will benefit from following plan.  Plan MDD-unstable Reduce Cymbalta to 90 mg p.o. daily Continue Wellbutrin SR 100 mg p.o. twice daily We will consider starting Abilify however will need an EKG prior to that. Patient advised to establish care with a new therapist, she has been noncompliant.  Provided education.  Panic disorder-improving Gabapentin 900 mg p.o. daily in divided dosage-prescribed by neurology Cymbalta as prescribed  Agoraphobia-stable Patient advised to restart CBT  Insomnia-stable Patient is compliant with CPAP, has a history of OSA Belsomra 10 mg p.o. nightly However patient does report excessive sleepiness-likely due to depression.  Also on polypharmacy.  Advised to take medications like gabapentin and medications like Topamax in the afternoon or at the end of the day or to reduce the dosage as needed.  She will have a discussion with her prescribers.  Tobacco use disorder-unstable Provided counseling for 5 minutes. Continue Wellbutrin SR 100 mg p.o. twice daily  Caffeine use disorder-improving.  Provided  counseling  Will order TSH-she will pick up lab slip and go to [facility].  At risk for prolonged QT syndrome-we will order EKG.  Patient provided phone [phone] to schedule the appointment.  Follow-up in clinic in 2 weeks or sooner if needed.  This note was generated in part or whole with voice recognition software. Voice recognition is usually quite accurate but there are transcription errors that can and very often do occur. I apologize for any typographical errors that were not detected and corrected.       Ursula Alert, MD 09/05/2021, 9:29 AM

## 2021-09-05 NOTE — Patient Instructions (Signed)
Aripiprazole Tablets What is this medication? ARIPIPRAZOLE (ay ri PIP ray zole) treats schizophrenia, bipolar I disorder, autism spectrum disorder, and Tourette disorder. It may also be used with antidepressant medications to treat depression. It works by balancing the levels of dopamine and serotonin in the brain, hormones that help regulate mood, behaviors, and thoughts. It belongs to a group of medications called antipsychotics. Antipsychotics can be used to treat several kinds of mental health conditions. This medicine may be used for other purposes; ask your health care provider or pharmacist if you have questions. COMMON BRAND NAME(S): Abilify What should I tell my care team before I take this medication? They need to know if you have any of these conditions: Dementia Diabetes Difficulty swallowing Have trouble controlling your muscles Have urges you are unable to control (for example, gambling, spending money, or eating) Heart disease History of irregular heartbeat History of stroke Low blood counts, like low white cell, platelet, or red cell counts Low blood pressure Parkinson disease Seizures Suicidal thoughts, plans or attempt; a previous suicide attempt by you or a family member An unusual or allergic reaction to aripiprazole, other medications, foods, dyes, or preservatives Pregnant or trying to get pregnant Breast-feeding How should I use this medication? Take this medication by mouth with a glass of water. Follow the directions on the prescription label. You can take this medication with or without food. Take your doses at regular intervals. Do not take your medication more often than directed. Do not stop taking except on the advice of your care team. A special MedGuide will be given to you by the pharmacist with each prescription and refill. Be sure to read this information carefully each time. Talk to your care team regarding the use of this medication in children. While  this medication may be prescribed for children as young as 6 years of age for selected conditions, precautions do apply. Overdosage: If you think you have taken too much of this medicine contact a poison control center or emergency room at once. NOTE: This medicine is only for you. Do not share this medicine with others. What if I miss a dose? If you miss a dose, take it as soon as you can. If it is almost time for your next dose, take only that dose. Do not take double or extra doses. What may interact with this medication? Do not take this medication with any of the following: Brexpiprazole Cisapride Dextromethorphan; quinidine Dronedarone Metoclopramide Pimozide Quinidine Thioridazine This medication may also interact with the following: Antihistamines for allergy, cough, and cold Carbamazepine Certain medications for anxiety or sleep Certain medications for depression like amitriptyline, fluoxetine, paroxetine, sertraline Certain medications for fungal infections like fluconazole, itraconazole, ketoconazole, posaconazole, voriconazole Clarithromycin General anesthetics like halothane, isoflurane, methoxyflurane, propofol Levodopa or other medications for Parkinson's disease Medications for blood pressure Medications for seizures Medications that relax muscles for surgery Narcotic medications for pain Other medications that prolong the QT interval (cause an abnormal heart rhythm) Phenothiazines like chlorpromazine, prochlorperazine Rifampin This list may not describe all possible interactions. Give your health care provider a list of all the medicines, herbs, non-prescription drugs, or dietary supplements you use. Also tell them if you smoke, drink alcohol, or use illegal drugs. Some items may interact with your medicine. What should I watch for while using this medication? Visit your care team for regular checks on your progress. Tell your care team if symptoms do not start to  get better or if they get worse. Do not   stop taking except on your care team's advice. You may develop a severe reaction. Your care team will tell you how much medication to take. Patients and their families should watch out for new or worsening depression or thoughts of suicide. Also watch out for sudden changes in feelings such as feeling anxious, agitated, panicky, irritable, hostile, aggressive, impulsive, severely restless, overly excited and hyperactive, or not being able to sleep. If this happens, especially at the beginning of antidepressant treatment or after a change in dose, call your care team. You may get dizzy or drowsy. Do not drive, use machinery, or do anything that needs mental alertness until you know how this medication affects you. Do not stand or sit up quickly, especially if you are an older patient. This reduces the risk of dizzy or fainting spells. Alcohol may interfere with the effect of this medication. Avoid alcoholic drinks. This medication can cause problems with controlling your body temperature. It can lower the response of your body to cold temperatures. If possible, stay indoors during cold weather. If you must go outdoors, wear warm clothes. It can also lower the response of your body to heat. Do not overheat. Do not over-exercise. Stay out of the sun when possible. If you must be in the sun, wear cool clothing. Drink plenty of water. If you have trouble controlling your body temperature, call your care team right away. This medication may cause dry eyes and blurred vision. If you wear contact lenses, you may feel some discomfort. Lubricating drops may help. See your eye care specialist if the problem does not go away or is severe. This medication may increase blood sugar. Ask your care team if changes in diet or medications are needed if you have diabetes. There have been reports of increased sexual urges or other strong urges such as gambling while taking this medication.  If you experience any of these while taking this medication, you should report this to your care team as soon as possible. What side effects may I notice from receiving this medication? Side effects that you should report to your care team as soon as possible: Allergic reactions-skin rash, itching, hives, swelling of the face, lips, tongue, or throat High blood sugar (hyperglycemia)-increased thirst or amount of urine, unusual weakness or fatigue, blurry vision High fever, stiff muscles, increased sweating, fast or irregular heartbeat, and confusion, which may be signs of neuroleptic malignant syndrome Low blood pressure-dizziness, feeling faint or lightheaded, blurry vision Pain or trouble swallowing Prolonged or painful erection Seizures Stroke-sudden numbness or weakness of the face, arm, or leg, trouble speaking, confusion, trouble walking, loss of balance or coordination, dizziness, severe headache, change in vision Uncontrolled and repetitive body movements, muscle stiffness or spasms, tremors or shaking, loss of balance or coordination, restlessness, shuffling walk, which may be signs of extrapyramidal symptoms (EPS) Thoughts of suicide or self-harm, worsening mood, feelings of depression Urges to engage in impulsive behaviors such as gambling, binge eating, sexual activity, or shopping in ways that are unusual for you Side effects that usually do not require medical attention (report these to your care team if they continue or are bothersome): Constipation Drowsiness Weight gain This list may not describe all possible side effects. Call your doctor for medical advice about side effects. You may report side effects to FDA at 1-800-FDA-1088. Where should I keep my medication? Keep out of the reach of children and pets. Store at room temperature between 15 and 30 degrees C (59 and 86 degrees F).  Throw away any unused medication after the expiration date. NOTE: This sheet is a summary. It  may not cover all possible information. If you have questions about this medicine, talk to your doctor, pharmacist, or health care provider.  2022 Elsevier/Gold Standard (2021-03-23 16:39:02)

## 2021-09-06 ENCOUNTER — Ambulatory Visit
Admission: RE | Admit: 2021-09-06 | Discharge: 2021-09-06 | Disposition: A | Discharge status: Home / Self Care | Payer: Medicare HMO | Source: Ambulatory Visit | Attending: Psychiatry | Admitting: Psychiatry

## 2021-09-06 ENCOUNTER — Telehealth: Payer: Self-pay | Admitting: Psychiatry

## 2021-09-06 ENCOUNTER — Other Ambulatory Visit
Admission: RE | Admit: 2021-09-06 | Discharge: 2021-09-06 | Disposition: A | Discharge status: Home / Self Care | Payer: Medicare HMO | Source: Home / Self Care | Attending: Psychiatry | Admitting: Psychiatry

## 2021-09-06 DIAGNOSIS — R000 Tachycardia, unspecified: Secondary | ICD-10-CM | POA: Insufficient documentation

## 2021-09-06 DIAGNOSIS — I4581 Long QT syndrome: Secondary | ICD-10-CM | POA: Diagnosis not present

## 2021-09-06 DIAGNOSIS — I4510 Unspecified right bundle-branch block: Secondary | ICD-10-CM | POA: Diagnosis not present

## 2021-09-06 DIAGNOSIS — F332 Major depressive disorder, recurrent severe without psychotic features: Secondary | ICD-10-CM | POA: Diagnosis not present

## 2021-09-06 LAB — TSH: TSH: 1.438 u[IU]/mL (ref 0.350–4.500)

## 2021-09-06 NOTE — Telephone Encounter (Signed)
Returned call to patient regarding her thyroid labs as well as EKG.  Patient advised to follow up with primary care provider for EKG changes.  We will consider starting on Abilify after her consultation with her primary care.  TSH is wnl.

## 2021-09-10 ENCOUNTER — Ambulatory Visit (INDEPENDENT_AMBULATORY_CARE_PROVIDER_SITE_OTHER): Payer: Medicare HMO | Admitting: Internal Medicine

## 2021-09-10 ENCOUNTER — Encounter: Payer: Self-pay | Admitting: Internal Medicine

## 2021-09-10 ENCOUNTER — Other Ambulatory Visit: Payer: Self-pay

## 2021-09-10 VITALS — BP 138/86 | HR 106 | Ht 66.5 in | Wt 241.0 lb

## 2021-09-10 DIAGNOSIS — G5603 Carpal tunnel syndrome, bilateral upper limbs: Secondary | ICD-10-CM | POA: Diagnosis not present

## 2021-09-10 DIAGNOSIS — Z6841 Body Mass Index (BMI) 40.0 and over, adult: Secondary | ICD-10-CM

## 2021-09-10 DIAGNOSIS — E079 Disorder of thyroid, unspecified: Secondary | ICD-10-CM | POA: Diagnosis not present

## 2021-09-10 DIAGNOSIS — R9431 Abnormal electrocardiogram [ECG] [EKG]: Secondary | ICD-10-CM

## 2021-09-10 DIAGNOSIS — E782 Mixed hyperlipidemia: Secondary | ICD-10-CM

## 2021-09-10 DIAGNOSIS — F410 Panic disorder [episodic paroxysmal anxiety] without agoraphobia: Secondary | ICD-10-CM

## 2021-09-10 DIAGNOSIS — R7303 Prediabetes: Secondary | ICD-10-CM

## 2021-09-10 NOTE — Assessment & Plan Note (Signed)

## 2021-09-10 NOTE — Assessment & Plan Note (Signed)
EKG shows sinus tachycardia No acute changes were noted except for incomplete right bundle branch block. We will do a stress test and echocardiogram to further evaluate coronary artery disease. Patient was suggested that she she should stop smoking.  Try to lose weight.

## 2021-09-10 NOTE — Progress Notes (Signed)
Established Patient Office Visit  Subjective:  Patient ID: Mary Koch, female    DOB: [birth year]  Age: 50 y.o. MRN: [id number]  CC:  Chief Complaint  Patient presents with   Abnormal ECG    Patient was told to make an appointment to speak with provider after abnormal EKG results    Palpitations  This is a new problem. The problem occurs every several days. The problem has been waxing and waning. On average, each episode lasts 30 minutes. Nothing aggravates the symptoms. Associated symptoms include anxiety, malaise/fatigue, numbness and shortness of breath. Pertinent negatives include no chest pain, coughing, diaphoresis, irregular heartbeat, syncope or vomiting. She has tried anxiolytics for the symptoms.   [name] presents for  tachycardia Past Medical History:  Diagnosis Date   Allergic rhinitis    Anemia    Anxiety    Anxiety and depression    Carpal tunnel syndrome    COVID-19 01/15/2020   Depression    Fibroids    GERD (gastroesophageal reflux disease)    Headache(784.0)    Headache(784.0)    Heavy periods    Hyperthyroidism    right portion of thyroid removed   Muscle spasm    Neurological disorder    evaluation for ms   Painful menstrual periods    Shortness of breath    when i smoke a lot    Past Surgical History:  Procedure Laterality Date   BREAST BIOPSY Left    neg   carpel tunnel Left 2017   CESAREAN SECTION     times 2   CHOLECYSTECTOMY     THYROID LOBECTOMY      Family History  Problem Relation Age of Onset   Osteoporosis Mother    Breast cancer Mother 44   Diabetes Father    Anxiety disorder Father    Depression Father    Post-traumatic stress disorder Father    Heart disease Father    Anxiety disorder Sister    Alcohol abuse Brother    Colon cancer Neg Hx    Ovarian cancer Neg Hx     Social History   Socioeconomic History   Marital status: Legally Separated    Spouse name: Not on file   Number of children: 2   Years  of education: Not on file   Highest education level: High school graduate  Occupational History   Not on file  Tobacco Use   Smoking status: Every Day    Packs/day: 1.00    Years: 25.00    Pack years: 25.00    Types: Cigarettes    Start date: 05/22/1986   Smokeless tobacco: Never  Vaping Use   Vaping Use: Never used  Substance and Sexual Activity   Alcohol use: No    Alcohol/week: 0.0 standard drinks   Drug use: No   Sexual activity: Yes    Partners: Male    Birth control/protection: Surgical, Injection  Other Topics Concern   Not on file  Social History Narrative   Not on file   Social Determinants of Health   Financial Resource Strain: Not on file  Food Insecurity: Not on file  Transportation Needs: Not on file  Physical Activity: Not on file  Stress: Not on file  Social Connections: Not on file  Intimate Partner Violence: Not on file     Current Outpatient Medications:    albuterol (VENTOLIN HFA) 108 (90 Base) MCG/ACT inhaler, INHALE 1 TO 2 PUFFS BY MOUTH DAILY, Disp: 6.7 each,  Rfl: 5   BREO ELLIPTA 100-25 MCG/INH AEPB, Inhale 1 puff into the lungs daily., Disp: 28 each, Rfl: 4   buPROPion (WELLBUTRIN SR) 100 MG 12 hr tablet, Take 1 tablet (100 mg total) by mouth 2 (two) times daily., Disp: 180 tablet, Rfl: 0   cyclobenzaprine (FLEXERIL) 10 MG tablet, Take 10 mg by mouth 3 (three) times daily as needed for muscle spasms., Disp: , Rfl:    DULoxetine (CYMBALTA) 30 MG capsule, Take 1 capsule (30 mg total) by mouth daily. Take along with 60 mg - total of 90 mg daily, Disp: 90 capsule, Rfl: 0   DULoxetine (CYMBALTA) 60 MG capsule, Take 1 capsule (60 mg total) by mouth daily. Take along with 30 mg daily, Disp: 90 capsule, Rfl: 0   ergocalciferol (VITAMIN D2) 1.25 MG (50000 UT) capsule, Take 1 capsule by mouth once a week. TAKES DIFFERENTLY - 1000 MG, Disp: , Rfl:    etodolac (LODINE) 400 MG tablet, Take by mouth., Disp: , Rfl:    furosemide (LASIX) 20 MG tablet, TAKE 1  TABLET BY MOUTH TWICE A WEEK, Disp: , Rfl: 5   gabapentin (NEURONTIN) 600 MG tablet, Take 1 tablet (600 mg total) by mouth 2 (two) times daily. (Patient taking differently: Take 900 mg by mouth as directed. 300 mg daily and 600 mg at bedtime), Disp: 180 tablet, Rfl: 1   meclizine (ANTIVERT) 12.5 MG tablet, , Disp: , Rfl:    medroxyPROGESTERone (DEPO-PROVERA) 150 MG/ML injection, INJECT 1 ML (150 MG TOTAL) INTO THE MUSCLE EVERY 3 (THREE) MONTHS., Disp: 1 mL, Rfl: 3   meloxicam (MOBIC) 7.5 MG tablet, TAKE 1 TABLET BY MOUTH EVERY DAY, Disp: 30 tablet, Rfl: 3   omeprazole (PRILOSEC) 40 MG capsule, TAKE 1 CAPSULE BY MOUTH EVERY DAY, Disp: 90 capsule, Rfl: 3   phentermine 15 MG capsule, TAKE 1 CAPSULE BY MOUTH ('15MG'$  TOTAL) EVERY MORNING, Disp: 30 capsule, Rfl: 0   propranolol (INDERAL) 10 MG tablet, TAKE 1 TABLET BY MOUTH THREE TIMES A DAY AS NEEDED (Patient taking differently: Take 60 mg by mouth. DAILY), Disp: 270 tablet, Rfl: 1   rosuvastatin (CRESTOR) 20 MG tablet, TAKE 1 TABLET BY MOUTH EVERY DAY, Disp: 90 tablet, Rfl: 3   Suvorexant (BELSOMRA) 10 MG TABS, Take 10 mg by mouth at bedtime., Disp: 30 tablet, Rfl: 1   topiramate (TOPAMAX) 100 MG tablet, Take 1.5 tablets (150 mg total) by mouth daily. 150 MG AM AND 200 MG PM ,BEING PRESCRIBED PER NEUROLOGY, Disp: 135 tablet, Rfl: 1   Ubrogepant 100 MG TABS, Take by mouth., Disp: , Rfl:    vitamin B-12 (CYANOCOBALAMIN) 1000 MCG tablet, Take 1,000 mcg by mouth daily., Disp: , Rfl:    Allergies  Allergen Reactions   Azithromycin Other (See Comments)    headache   Codeine Other (See Comments)    unknown   Penicillins Other (See Comments)    headaches    ROS Review of Systems  Constitutional:  Positive for malaise/fatigue. Negative for diaphoresis.  Respiratory:  Positive for shortness of breath. Negative for cough.   Cardiovascular:  Positive for palpitations. Negative for chest pain and syncope.  Gastrointestinal:  Negative for vomiting.   Neurological:  Positive for numbness.  Psychiatric/Behavioral:  The patient is nervous/anxious.      Objective:    Physical Exam Vitals reviewed.  Constitutional:      Appearance: Normal appearance.  HENT:     Mouth/Throat:     Mouth: Mucous membranes are moist.  Eyes:  Pupils: Pupils are equal, round, and reactive to light.  Neck:     Vascular: No carotid bruit.  Cardiovascular:     Rate and Rhythm: Regular rhythm. Tachycardia present.     Pulses: Normal pulses.     Heart sounds: Normal heart sounds.  Pulmonary:     Effort: Pulmonary effort is normal.     Breath sounds: Normal breath sounds.  Abdominal:     General: Bowel sounds are normal.     Palpations: Abdomen is soft. There is no hepatomegaly, splenomegaly or mass.     Tenderness: There is no abdominal tenderness.     Hernia: No hernia is present.  Musculoskeletal:        General: No tenderness.     Cervical back: Neck supple.     Right lower leg: No edema.     Left lower leg: No edema.  Skin:    Findings: No rash.  Neurological:     Mental Status: She is alert and oriented to person, place, and time.     Motor: No weakness.  Psychiatric:        Mood and Affect: Mood and affect normal.        Behavior: Behavior normal.    BP 138/86   Pulse (!) 106   Ht 5' 6.5" (1.689 m)   Wt 241 lb (109.3 kg)   BMI 38.32 kg/m  Wt Readings from Last 3 Encounters:  09/10/21 241 lb (109.3 kg)  04/27/21 253 lb 8 oz (115 kg)  03/30/21 262 lb 11.2 oz (119.2 kg)     Health Maintenance Due  Topic Date Due   COVID-19 Vaccine (1) Never done   PNEUMOCOCCAL POLYSACCHARIDE VACCINE AGE 43-64 HIGH RISK  Never done   Pneumococcal Vaccine 75-43 Years old (1 - PCV) Never done   FOOT EXAM  Never done   OPHTHALMOLOGY EXAM  Never done   URINE MICROALBUMIN  Never done   HIV Screening  Never done   Hepatitis C Screening  Never done   COLONOSCOPY (Pts 45-44yr Insurance coverage will need to be confirmed)  Never done   PAP  SMEAR-Modifier  10/30/2020   INFLUENZA VACCINE  07/30/2021    There are no preventive care reminders to display for this patient.  Lab Results  Component Value Date   TSH 1.438 09/06/2021   Lab Results  Component Value Date   WBC 12.8 (H) 05/18/2017   HGB 13.9 05/18/2017   HCT 40.8 05/18/2017   MCV 83.2 05/18/2017   PLT 251 05/18/2017   Lab Results  Component Value Date   NA 138 05/18/2017   K 4.5 05/18/2017   CO2 23 05/18/2017   GLUCOSE 110 (H) 05/18/2017   BUN 15 05/18/2017   CREATININE 0.85 05/18/2017   BILITOT 0.7 05/18/2017   ALKPHOS 66 05/18/2017   AST 24 05/18/2017   ALT 18 05/18/2017   PROT 7.3 05/18/2017   ALBUMIN 3.7 05/18/2017   CALCIUM 9.2 05/18/2017   ANIONGAP 7 05/18/2017   No results found for: CHOL No results found for: HDL No results found for: LDLCALC No results found for: TRIG No results found for: CHOLHDL Lab Results  Component Value Date   HGBA1C 5.2 03/30/2021      Assessment & Plan:   Problem List Items Addressed This Visit       Endocrine   Disease of thyroid gland - Primary    Patient has a history of subtotal thyroidectomy.        Nervous  and Auditory   Carpal tunnel syndrome    Carpal tunnel syndrome is stable at the present time        Other   HLD (hyperlipidemia)    Hypercholesterolemia  I advised the patient to follow Mediterranean diet This diet is rich in fruits vegetables and whole grain, and This diet is also rich in fish and lean meat Patient should also eat a handful of almonds or walnuts daily Recent heart study indicated that average follow-up on this kind of diet reduces the cardiovascular mortality by 50 to 70%==      Class 3 severe obesity due to excess calories with serious comorbidity and body mass index (BMI) of 40.0 to 44.9 in adult [facility])    - I encouraged the patient to lose weight.  - I educated them on making healthy dietary choices including eating more fruits and vegetables and less fried  foods. - I encouraged the patient to exercise more, and educated on the benefits of exercise including weight loss, diabetes prevention, and hypertension prevention.   Dietary counseling with a registered dietician  Referral to a weight management support group (e.g. Weight Watchers, Overeaters Anonymous)  If your BMI is greater than 29 or you have gained more than 15 pounds you should work on weight loss.  Attend a healthy cooking class       Panic disorder   Prediabetes    Patient is at risk for diabetes so we will do a complete blood test on her.  Thyroid function has been done by the psychiatrist so will not be repeated.      EKG abnormalities    EKG shows sinus tachycardia No acute changes were noted except for incomplete right bundle branch block. We will do a stress test and echocardiogram to further evaluate coronary artery disease. Patient was suggested that she she should stop smoking.  Try to lose weight.     We will schedule the patient for stress test and echocardiogram to evaluate the tachycardia.  She has been advised to quit smoking.  She was also advised to lose weight.  She was advised to walk on a daily basis.  Her EKG in the hospital was reviewed which revealed sinus tachycardia incomplete right bundle branch block EKG does not show any acute changes continue to talk to the computer  No orders of the defined types were placed in this encounter.   Follow-up: No follow-ups on file.    Cletis Athens, MD

## 2021-09-10 NOTE — Assessment & Plan Note (Signed)
Hypercholesterolemia  I advised the patient to follow Mediterranean diet This diet is rich in fruits vegetables and whole grain, and This diet is also rich in fish and lean meat Patient should also eat a handful of almonds or walnuts daily Recent heart study indicated that average follow-up on this kind of diet reduces the cardiovascular mortality by 50 to 70%== 

## 2021-09-10 NOTE — Assessment & Plan Note (Signed)
Carpal tunnel syndrome is stable at the present time

## 2021-09-10 NOTE — Assessment & Plan Note (Signed)
Patient has a history of subtotal thyroidectomy.

## 2021-09-10 NOTE — Assessment & Plan Note (Signed)
Patient is at risk for diabetes so we will do a complete blood test on her.  Thyroid function has been done by the psychiatrist so will not be repeated.

## 2021-09-13 ENCOUNTER — Telehealth (HOSPITAL_BASED_OUTPATIENT_CLINIC_OR_DEPARTMENT_OTHER): Payer: Medicare HMO | Admitting: Psychiatry

## 2021-09-13 ENCOUNTER — Other Ambulatory Visit: Payer: Self-pay

## 2021-09-13 ENCOUNTER — Encounter: Payer: Self-pay | Admitting: Psychiatry

## 2021-09-13 DIAGNOSIS — G4701 Insomnia due to medical condition: Secondary | ICD-10-CM | POA: Diagnosis not present

## 2021-09-13 DIAGNOSIS — F1590 Other stimulant use, unspecified, uncomplicated: Secondary | ICD-10-CM | POA: Diagnosis not present

## 2021-09-13 DIAGNOSIS — F332 Major depressive disorder, recurrent severe without psychotic features: Secondary | ICD-10-CM | POA: Insufficient documentation

## 2021-09-13 DIAGNOSIS — F410 Panic disorder [episodic paroxysmal anxiety] without agoraphobia: Secondary | ICD-10-CM

## 2021-09-13 DIAGNOSIS — F4000 Agoraphobia, unspecified: Secondary | ICD-10-CM | POA: Diagnosis not present

## 2021-09-13 DIAGNOSIS — F17200 Nicotine dependence, unspecified, uncomplicated: Secondary | ICD-10-CM | POA: Diagnosis not present

## 2021-09-13 DIAGNOSIS — Z9189 Other specified personal risk factors, not elsewhere classified: Secondary | ICD-10-CM

## 2021-09-13 NOTE — Progress Notes (Signed)
Virtual Visit via Video Note  I connected with Mary Koch on 09/13/21 at  4:40 PM EDT by a video enabled telemedicine application and verified that I am speaking with the correct person using two identifiers.  Location Provider Location : [facility] Patient Location : Home  Participants: Patient , Provider    I discussed the limitations of evaluation and management by telemedicine and the availability of in person appointments. The patient expressed understanding and agreed to proceed.   I discussed the assessment and treatment plan with the patient. The patient was provided an opportunity to ask questions and all were answered. The patient agreed with the plan and demonstrated an understanding of the instructions.   The patient was advised to call back or seek an in-person evaluation if the symptoms worsen or if the condition fails to improve as anticipated.   [facility] MD OP Progress Note  09/13/2021 5:02 PM [place]  MRN:  [id number]  Chief Complaint:  Chief Complaint   Follow-up; Depression; Anxiety    HPI: Mary Koch is a 50 year old Caucasian female who lives in [place], has a history of MDD, panic disorder, agoraphobia, insomnia, tobacco use disorder, caffeine use disorder was evaluated by telemedicine today.  Patient reports her sleep has improved.  She is currently working on sleep hygiene and that has helped.  She is compliant on CPAP.  Patient continues to have depressive symptoms.  She reports she continues to have sadness, low motivation tiredness and low energy.  She has been trying to take some of her medications at the end of the day which has helped some.  Patient has echocardiogram and stress test scheduled the beginning of October.  Patient looks forward to that.  Patient is compliant on medications.  Denies suicidality, homicidality or perceptual disturbances.  Patient denies any other concerns today.  Visit Diagnosis:    ICD-10-CM   1. Severe episode  of recurrent major depressive disorder, without psychotic features ([place])  F33.2     2. Panic disorder  F41.0     3. Agoraphobia  F40.00     4. Insomnia due to medical condition  G47.01    depression    5. Tobacco use disorder  F17.200     6. 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 At risk for prolonged QT interval syndrome  Z91.89       Past Psychiatric History: I have reviewed past psychiatric history from progress note on 03/24/2018.  Past trials of Paxil, Luvox, Klonopin, Zoloft, Seroquel, Wellbutrin, Ambien  Past Medical History:  Past Medical History:  Diagnosis Date   Allergic rhinitis    Anemia    Anxiety    Anxiety and depression    Carpal tunnel syndrome    COVID-19 01/15/2020   Depression    Fibroids    GERD (gastroesophageal reflux disease)    Headache(784.0)    Headache(784.0)    Heavy periods    Hyperthyroidism    right portion of thyroid removed   Muscle spasm    Neurological disorder    evaluation for ms   Painful menstrual periods    Shortness of breath    when i smoke a lot    Past Surgical History:  Procedure Laterality Date   BREAST BIOPSY Left    neg   carpel tunnel Left 2017   CESAREAN SECTION     times 2   CHOLECYSTECTOMY     THYROID LOBECTOMY      Family  Psychiatric History: Reviewed family psychiatric history from progress note on 03/24/2018  Family History:  Family History  Problem Relation Age of Onset   Osteoporosis Mother    Breast cancer Mother 21   Diabetes Father    Anxiety disorder Father    Depression Father    Post-traumatic stress disorder Father    Heart disease Father    Anxiety disorder Sister    Alcohol abuse Brother    Colon cancer Neg Hx    Ovarian cancer Neg Hx     Social History: Reviewed social history from progress note on 03/24/2018 Social History   Socioeconomic History   Marital status: Legally Separated    Spouse name: Not on file   Number of children: 2   Years of education: Not on file    Highest education level: High school graduate  Occupational History   Not on file  Tobacco Use   Smoking status: Every Day    Packs/day: 1.00    Years: 25.00    Pack years: 25.00    Types: Cigarettes    Start date: 05/22/1986   Smokeless tobacco: Never  Vaping Use   Vaping Use: Never used  Substance and Sexual Activity   Alcohol use: No    Alcohol/week: 0.0 standard drinks   Drug use: No   Sexual activity: Yes    Partners: Male    Birth control/protection: Surgical, Injection  Other Topics Concern   Not on file  Social History Narrative   Not on file   Social Determinants of Health   Financial Resource Strain: Not on file  Food Insecurity: Not on file  Transportation Needs: Not on file  Physical Activity: Not on file  Stress: Not on file  Social Connections: Not on file    Allergies:  Allergies  Allergen Reactions   Azithromycin Other (See Comments)    headache   Codeine Other (See Comments)    unknown   Penicillins Other (See Comments)    headaches    Metabolic Disorder Labs: Lab Results  Component Value Date   HGBA1C 5.2 03/30/2021   No results found for: PROLACTIN No results found for: CHOL, TRIG, HDL, CHOLHDL, VLDL, LDLCALC Lab Results  Component Value Date   TSH 1.438 09/06/2021    Therapeutic Level Labs: No results found for: LITHIUM No results found for: VALPROATE No components found for:  CBMZ  Current Medications: Current Outpatient Medications  Medication Sig Dispense Refill   albuterol (VENTOLIN HFA) 108 (90 Base) MCG/ACT inhaler INHALE 1 TO 2 PUFFS BY MOUTH DAILY 6.7 each 5   BREO ELLIPTA 100-25 MCG/INH AEPB Inhale 1 puff into the lungs daily. 28 each 4   buPROPion (WELLBUTRIN SR) 100 MG 12 hr tablet Take 1 tablet (100 mg total) by mouth 2 (two) times daily. 180 tablet 0   cyclobenzaprine (FLEXERIL) 10 MG tablet Take 10 mg by mouth 3 (three) times daily as needed for muscle spasms.     DULoxetine (CYMBALTA) 30 MG capsule Take 1 capsule  (30 mg total) by mouth daily. Take along with 60 mg - total of 90 mg daily 90 capsule 0   DULoxetine (CYMBALTA) 60 MG capsule Take 1 capsule (60 mg total) by mouth daily. Take along with 30 mg daily 90 capsule 0   ergocalciferol (VITAMIN D2) 1.25 MG (50000 UT) capsule Take 1 capsule by mouth once a week. TAKES DIFFERENTLY - 1000 MG     etodolac (LODINE) 400 MG tablet Take by mouth.  furosemide (LASIX) 20 MG tablet TAKE 1 TABLET BY MOUTH TWICE A WEEK  5   gabapentin (NEURONTIN) 600 MG tablet Take 1 tablet (600 mg total) by mouth 2 (two) times daily. (Patient taking differently: Take 900 mg by mouth as directed. 300 mg daily and 600 mg at bedtime) 180 tablet 1   meclizine (ANTIVERT) 12.5 MG tablet      medroxyPROGESTERone (DEPO-PROVERA) 150 MG/ML injection INJECT 1 ML (150 MG TOTAL) INTO THE MUSCLE EVERY 3 (THREE) MONTHS. 1 mL 3   meloxicam (MOBIC) 7.5 MG tablet TAKE 1 TABLET BY MOUTH EVERY DAY 30 tablet 3   omeprazole (PRILOSEC) 40 MG capsule TAKE 1 CAPSULE BY MOUTH EVERY DAY 90 capsule 3   phentermine 15 MG capsule TAKE 1 CAPSULE BY MOUTH ('15MG'$  TOTAL) EVERY MORNING 30 capsule 0   propranolol (INDERAL) 10 MG tablet TAKE 1 TABLET BY MOUTH THREE TIMES A DAY AS NEEDED (Patient taking differently: Take 60 mg by mouth. DAILY) 270 tablet 1   rosuvastatin (CRESTOR) 20 MG tablet TAKE 1 TABLET BY MOUTH EVERY DAY 90 tablet 3   Suvorexant (BELSOMRA) 10 MG TABS Take 10 mg by mouth at bedtime. 30 tablet 1   topiramate (TOPAMAX) 100 MG tablet Take 1.5 tablets (150 mg total) by mouth daily. 150 MG AM AND 200 MG PM ,BEING PRESCRIBED PER NEUROLOGY 135 tablet 1   Ubrogepant 100 MG TABS Take by mouth.     vitamin B-12 (CYANOCOBALAMIN) 1000 MCG tablet Take 1,000 mcg by mouth daily.     No current facility-administered medications for this visit.     Musculoskeletal: Strength & Muscle Tone:  UTA Gait & Station: normal Patient leans: N/A  Psychiatric Specialty Exam: Review of Systems  Constitutional:   Positive for fatigue.  Psychiatric/Behavioral:  Positive for decreased concentration and dysphoric mood.   All other systems reviewed and are negative.  There were no vitals taken for this visit.There is no height or weight on file to calculate BMI.  General Appearance: Casual  Eye Contact:  Fair  Speech:  Clear and Coherent  Volume:  Normal  Mood:  Depressed  Affect:  Congruent  Thought Process:  Goal Directed and Descriptions of Associations: Intact  Orientation:  Full (Time, Place, and Person)  Thought Content: Logical   Suicidal Thoughts:  No  Homicidal Thoughts:  No  Memory:  Immediate;   Fair Recent;   Fair Remote;   Fair  Judgement:  Fair  Insight:  Fair  Psychomotor Activity:  Normal  Concentration:  Concentration: Fair and Attention Span: Fair  Recall:  [employer] of Knowledge: Fair  Language: Fair  Akathisia:  No  Handed:  Right  AIMS (if indicated): done  Assets:  Communication Skills [employer] Talents/Skills Transportation  ADL's:  Intact  Cognition: WNL  Sleep:   Improving   Screenings: AUDIT    Flowsheet Row Office Visit from 05/22/2016 in [facility]  Alcohol Use Disorder Identification Test Final Score (AUDIT) 0      GAD-7    Flowsheet Row Counselor from 10/06/2020 in [facility] Office Visit from 08/10/2019 in [facility]  Total GAD-7 Score 15 14      PHQ2-9    [place] Visit from 09/10/2021 in [facility] Video Visit from 09/05/2021 in [facility] Video Visit from 07/09/2021 in [facility] Video Visit from 02/26/2021 in [facility] Counselor from 02/22/2021 in  [facility]  PHQ-2 Total Score 6 6 0 2 4  PHQ-9 Total Score 20 24 -- 12 14      Flowsheet Row Video Visit from 02/26/2021 in [facility] Counselor from 02/22/2021 in [facility] No Risk No Risk        Assessment and Plan: Mary Koch is a 50 year old Caucasian female who has a history of MDD, panic attacks, tobacco use disorder, caffeine use disorder, insomnia was evaluated by telemedicine today.  Patient with depression symptoms, with recent EKG changes, scheduled for further work-up.  Discussed plan as noted below.  Plan MDD-unstable Cymbalta at reduced dosage of 90 mg p.o. daily Wellbutrin SR 100 mg p.o. twice daily We will consider starting Abilify however will await echocardiogram, scheduled for beginning of October. In the meantime we will refer her for psychotherapy sessions, I have sent communication to front desk AIMS - 0  Panic disorder-improving Gabapentin 900 mg p.o. daily in divided dosage-prescribed by neurology Cymbalta 90 mg p.o. daily  Agoraphobia-stable Restart CBT  Insomnia-stable Patient is compliant with CPAP, has a history of OSA. Continue sleep hygiene techniques Belsomra 10 mg p.o. nightly  Tobacco use disorder-unstable Provided counseling for 2 minutes Wellbutrin SR 100 mg p.o. twice daily  Caffeine use disorder-improving.  Provided counseling  At risk for prolonged QT syndrome-reviewed and discussed EKG-dated 09/06/2021-right bundle branch block, tachycardia.  Patient has upcoming stress test and echocardiogram scheduled.  Reviewed and discussed TSH-within normal limits.  Follow-up in clinic in 3 weeks or sooner if needed.  This note was generated in part or whole with voice recognition software. Voice recognition is usually quite accurate but there are transcription errors that can and very often do occur. I apologize for any typographical errors that were not detected and corrected.       Ursula Alert, MD 09/14/2021, 8:42 AM

## 2021-09-25 ENCOUNTER — Other Ambulatory Visit: Payer: Self-pay

## 2021-09-25 ENCOUNTER — Other Ambulatory Visit: Payer: Medicare HMO

## 2021-09-25 DIAGNOSIS — R9431 Abnormal electrocardiogram [ECG] [EKG]: Secondary | ICD-10-CM

## 2021-09-25 DIAGNOSIS — F17200 Nicotine dependence, unspecified, uncomplicated: Secondary | ICD-10-CM

## 2021-09-25 DIAGNOSIS — I10 Essential (primary) hypertension: Secondary | ICD-10-CM

## 2021-09-25 DIAGNOSIS — R0602 Shortness of breath: Secondary | ICD-10-CM

## 2021-09-25 NOTE — Assessment & Plan Note (Signed)

## 2021-09-25 NOTE — Progress Notes (Unsigned)
Established Patient Office Visit  Subjective:  Patient ID: Mary Koch, female    DOB: June 25, 1971  Age: 50 y.o. MRN: [id number]  CC: No chief complaint on file.   HPI  Mary Koch presents for echo  Past Medical History:  Diagnosis Date   Allergic rhinitis    Anemia    Anxiety    Anxiety and depression    Carpal tunnel syndrome    COVID-19 01/15/2020   Depression    Fibroids    GERD (gastroesophageal reflux disease)    Headache(784.0)    Headache(784.0)    Heavy periods    Hyperthyroidism    right portion of thyroid removed   Muscle spasm    Neurological disorder    evaluation for ms   Painful menstrual periods    Shortness of breath    when i smoke a lot    Past Surgical History:  Procedure Laterality Date   BREAST BIOPSY Left    neg   carpel tunnel Left 2017   CESAREAN SECTION     times 2   CHOLECYSTECTOMY     THYROID LOBECTOMY      Family History  Problem Relation Age of Onset   Osteoporosis Mother    Breast cancer Mother 62   Diabetes Father    Anxiety disorder Father    Depression Father    Post-traumatic stress disorder Father    Heart disease Father    Anxiety disorder Sister    Alcohol abuse Brother    Colon cancer Neg Hx    Ovarian cancer Neg Hx     Social History   Socioeconomic History   Marital status: Legally Separated    Spouse name: Not on file   Number of children: 2   Years of education: Not on file   Highest education level: High school graduate  Occupational History   Not on file  Tobacco Use   Smoking status: Every Day    Packs/day: 1.00    Years: 25.00    Pack years: 25.00    Types: Cigarettes    Start date: 05/22/1986   Smokeless tobacco: Never  Vaping Use   Vaping Use: Never used  Substance and Sexual Activity   Alcohol use: No    Alcohol/week: 0.0 standard drinks   Drug use: No   Sexual activity: Yes    Partners: Male    Birth control/protection: Surgical, Injection  Other Topics Concern   Not on  file  Social History Narrative   Not on file   Social Determinants of Health   Financial Resource Strain: Not on file  Food Insecurity: Not on file  Transportation Needs: Not on file  Physical Activity: Not on file  Stress: Not on file  Social Connections: Not on file  Intimate Partner Violence: Not on file     Current Outpatient Medications:    albuterol (VENTOLIN HFA) 108 (90 Base) MCG/ACT inhaler, INHALE 1 TO 2 PUFFS BY MOUTH DAILY, Disp: 6.7 each, Rfl: 5   BREO ELLIPTA 100-25 MCG/INH AEPB, Inhale 1 puff into the lungs daily., Disp: 28 each, Rfl: 4   buPROPion (WELLBUTRIN SR) 100 MG 12 hr tablet, Take 1 tablet (100 mg total) by mouth 2 (two) times daily., Disp: 180 tablet, Rfl: 0   cyclobenzaprine (FLEXERIL) 10 MG tablet, Take 10 mg by mouth 3 (three) times daily as needed for muscle spasms., Disp: , Rfl:    DULoxetine (CYMBALTA) 30 MG capsule, Take 1 capsule (30 mg total)  by mouth daily. Take along with 60 mg - total of 90 mg daily, Disp: 90 capsule, Rfl: 0   DULoxetine (CYMBALTA) 60 MG capsule, Take 1 capsule (60 mg total) by mouth daily. Take along with 30 mg daily, Disp: 90 capsule, Rfl: 0   ergocalciferol (VITAMIN D2) 1.25 MG (50000 UT) capsule, Take 1 capsule by mouth once a week. TAKES DIFFERENTLY - 1000 MG, Disp: , Rfl:    etodolac (LODINE) 400 MG tablet, Take by mouth., Disp: , Rfl:    furosemide (LASIX) 20 MG tablet, TAKE 1 TABLET BY MOUTH TWICE A WEEK, Disp: , Rfl: 5   gabapentin (NEURONTIN) 600 MG tablet, Take 1 tablet (600 mg total) by mouth 2 (two) times daily. (Patient taking differently: Take 900 mg by mouth as directed. 300 mg daily and 600 mg at bedtime), Disp: 180 tablet, Rfl: 1   meclizine (ANTIVERT) 12.5 MG tablet, , Disp: , Rfl:    medroxyPROGESTERone (DEPO-PROVERA) 150 MG/ML injection, INJECT 1 ML (150 MG TOTAL) INTO THE MUSCLE EVERY 3 (THREE) MONTHS., Disp: 1 mL, Rfl: 3   meloxicam (MOBIC) 7.5 MG tablet, TAKE 1 TABLET BY MOUTH EVERY DAY, Disp: 30 tablet, Rfl:  3   omeprazole (PRILOSEC) 40 MG capsule, TAKE 1 CAPSULE BY MOUTH EVERY DAY, Disp: 90 capsule, Rfl: 3   phentermine 15 MG capsule, TAKE 1 CAPSULE BY MOUTH (15MG  TOTAL) EVERY MORNING, Disp: 30 capsule, Rfl: 0   propranolol (INDERAL) 10 MG tablet, TAKE 1 TABLET BY MOUTH THREE TIMES A DAY AS NEEDED (Patient taking differently: Take 60 mg by mouth. DAILY), Disp: 270 tablet, Rfl: 1   rosuvastatin (CRESTOR) 20 MG tablet, TAKE 1 TABLET BY MOUTH EVERY DAY, Disp: 90 tablet, Rfl: 3   Suvorexant (BELSOMRA) 10 MG TABS, Take 10 mg by mouth at bedtime., Disp: 30 tablet, Rfl: 1   topiramate (TOPAMAX) 100 MG tablet, Take 1.5 tablets (150 mg total) by mouth daily. 150 MG AM AND 200 MG PM ,BEING PRESCRIBED PER NEUROLOGY, Disp: 135 tablet, Rfl: 1   Ubrogepant 100 MG TABS, Take by mouth., Disp: , Rfl:    vitamin B-12 (CYANOCOBALAMIN) 1000 MCG tablet, Take 1,000 mcg by mouth daily., Disp: , Rfl:    Allergies  Allergen Reactions   Azithromycin Other (See Comments)    headache   Codeine Other (See Comments)    unknown   Penicillins Other (See Comments)    headaches    ROS Review of Systems  Constitutional: Negative.   HENT: Negative.    Eyes: Negative.   Respiratory: Negative.    Cardiovascular: Negative.   Gastrointestinal: Negative.   Endocrine: Negative.   Genitourinary: Negative.   Musculoskeletal: Negative.   Skin: Negative.   Allergic/Immunologic: Negative.   Neurological: Negative.   Hematological: Negative.   Psychiatric/Behavioral: Negative.    All other systems reviewed and are negative.    Objective:    Physical Exam Vitals reviewed.  Constitutional:      Appearance: Normal appearance.  HENT:     Mouth/Throat:     Mouth: Mucous membranes are moist.  Eyes:     Pupils: Pupils are equal, round, and reactive to light.  Neck:     Vascular: No carotid bruit.  Cardiovascular:     Rate and Rhythm: Normal rate and regular rhythm.     Pulses: Normal pulses.     Heart sounds: Normal  heart sounds.  Pulmonary:     Effort: Pulmonary effort is normal.     Breath sounds: Normal breath sounds.  Abdominal:     General: Bowel sounds are normal.     Palpations: Abdomen is soft. There is no hepatomegaly, splenomegaly or mass.     Tenderness: There is no abdominal tenderness.     Hernia: No hernia is present.  Musculoskeletal:        General: No tenderness.     Cervical back: Neck supple.     Right lower leg: No edema.     Left lower leg: No edema.  Skin:    Findings: No rash.  Neurological:     Mental Status: She is alert and oriented to person, place, and time.     Motor: No weakness.  Psychiatric:        Mood and Affect: Mood and affect normal.        Behavior: Behavior normal.    There were no vitals taken for this visit. Wt Readings from Last 3 Encounters:  09/10/21 241 lb (109.3 kg)  04/27/21 253 lb 8 oz (115 kg)  03/30/21 262 lb 11.2 oz (119.2 kg)     Health Maintenance Due  Topic Date Due   COVID-19 Vaccine (1) Never done   FOOT EXAM  Never done   OPHTHALMOLOGY EXAM  Never done   URINE MICROALBUMIN  Never done   HIV Screening  Never done   Hepatitis C Screening  Never done   COLONOSCOPY (Pts 45-32yrs Insurance coverage will need to be confirmed)  Never done   PAP SMEAR-Modifier  10/30/2020   INFLUENZA VACCINE  07/30/2021    There are no preventive care reminders to display for this patient.  Lab Results  Component Value Date   TSH 1.438 09/06/2021   Lab Results  Component Value Date   WBC 12.8 (H) 05/18/2017   HGB 13.9 05/18/2017   HCT 40.8 05/18/2017   MCV 83.2 05/18/2017   PLT 251 05/18/2017   Lab Results  Component Value Date   NA 138 05/18/2017   K 4.5 05/18/2017   CO2 23 05/18/2017   GLUCOSE 110 (H) 05/18/2017   BUN 15 05/18/2017   CREATININE 0.85 05/18/2017   BILITOT 0.7 05/18/2017   ALKPHOS 66 05/18/2017   AST 24 05/18/2017   ALT 18 05/18/2017   PROT 7.3 05/18/2017   ALBUMIN 3.7 05/18/2017   CALCIUM 9.2 05/18/2017    ANIONGAP 7 05/18/2017   No results found for: CHOL No results found for: HDL No results found for: LDLCALC No results found for: TRIG No results found for: CHOLHDL Lab Results  Component Value Date   HGBA1C 5.2 03/30/2021      Assessment & Plan:   Problem List Items Addressed This Visit       Cardiovascular and Mediastinum   Essential hypertension     Patient denies any chest pain or shortness of breath there is no history of palpitation or paroxysmal nocturnal dyspnea   patient was advised to follow low-salt low-cholesterol diet    ideally I want to keep systolic blood pressure below 130 mmHg, patient was asked to check blood pressure one times a week and give me a report on that.  Patient will be follow-up in 3 months  or earlier as needed, patient will call me back for any change in the cardiovascular symptoms Patient was advised to buy a book from local bookstore concerning blood pressure and read several chapters  every day.  This will be supplemented by some of the material we will give him from the office.  Patient should also utilize  other resources like YouTube and Internet to learn more about the blood pressure and the diet.        Other   Tobacco use disorder    - I instructed the patient to stop smoking and provided them with smoking cessation materials.  - I informed the patient that smoking puts them at increased risk for cancer, COPD, hypertension, and more.  - Informed the patient to seek help if they begin to have trouble breathing, develop chest pain, start to cough up blood, feel faint, or pass out.      EKG abnormalities - Primary    We will get an echocardiogram to evaluate the EKG to make sure the patient does not have an underlying cardiomyopathy      Shortness of breath    We will do an echocardiogram to evaluate left ventricular function     [facility] [place], [place] [address] Phone: [phone] Fax:  [phone]  Transthoracic Echocardiogram Note  Katianne Barre [id number] [birth year]  Procedure: Transthoracic Echocardiogram Indications: Shortness of breath Verbal Consent: Obtained  Procedure Details Two-dimensional echocardiogram was obtained in the long axis short axis and apical four-chamber view.  Echo was technically difficult because of exogenous obesity.  Mitral valve is normal tricuspid valve is normal left ventricular function is normal with ejection fraction 55%.  There is no pericardial effusion.  No blood clot was seen in the left ventricular cavity or in the left atrium.   Technical quality: good  Resting Measurements: Within normal range  Left Ventrical: Left ventricular size is normal  Mitral Valve: Mitral valve is normal  Aortic Valve: Aortic valve is normal  Tricuspid Valve: Tricuspid valve is normal  Pulmonic Valve: Pulmonic valve not seen  Left Atrium/ Left atrial appendage: Left atrial size is normal no blood clot is noted aortic root is normal without any aneurysm.  Atrial septum:   Aorta: Aortic root is normal   Complications: No apparent complications Patient did  tolerate procedure well.  Cletis Athens, MD   No orders of the defined types were placed in this encounter.   Follow-up: No follow-ups on file.    Cletis Athens, MD

## 2021-09-25 NOTE — Assessment & Plan Note (Signed)
We will get an echocardiogram to evaluate the EKG to make sure the patient does not have an underlying cardiomyopathy

## 2021-09-25 NOTE — Assessment & Plan Note (Signed)
-   I instructed the patient to stop smoking and provided them with smoking cessation materials.  - I informed the patient that smoking puts them at increased risk for cancer, COPD, hypertension, and more.  - Informed the patient to seek help if they begin to have trouble breathing, develop chest pain, start to cough up blood, feel faint, or pass out.  

## 2021-09-25 NOTE — Assessment & Plan Note (Signed)
We will do an echocardiogram to evaluate left ventricular function

## 2021-09-26 ENCOUNTER — Ambulatory Visit: Payer: Medicare HMO

## 2021-09-27 ENCOUNTER — Ambulatory Visit (INDEPENDENT_AMBULATORY_CARE_PROVIDER_SITE_OTHER): Payer: Medicare HMO | Admitting: *Deleted

## 2021-09-27 ENCOUNTER — Ambulatory Visit: Payer: Medicare HMO

## 2021-09-27 DIAGNOSIS — Z0000 Encounter for general adult medical examination without abnormal findings: Secondary | ICD-10-CM | POA: Diagnosis not present

## 2021-09-27 NOTE — Progress Notes (Signed)
I have reviewed this visit and agree with the documentation.   

## 2021-09-27 NOTE — Progress Notes (Signed)
Subjective:   Mary Koch is a 50 y.o. female who presents for an Initial Medicare Annual Wellness Visit.  I discussed the limitations of evaluation and management by telemedicine and the availability of in person appointments. Patient expressed understanding and agreed to proceed.   Visit performed using audio  Patient:home Provider:home   Review of Systems    Defer to provider Cardiac Risk Factors include: none     Objective:    There were no vitals filed for this visit. There is no height or weight on file to calculate BMI.  Advanced Directives 09/27/2021 05/18/2017 01/14/2012  Does Patient Have a Medical Advance Directive? No No Patient does not have advance directive  Would patient like information on creating a medical advance directive? No - Patient declined No - Patient declined -  Some encounter information is confidential and restricted. Go to Review Flowsheets activity to see all data.    Current Medications (verified) Outpatient Encounter Medications as of 09/27/2021  Medication Sig   albuterol (VENTOLIN HFA) 108 (90 Base) MCG/ACT inhaler INHALE 1 TO 2 PUFFS BY MOUTH DAILY   BREO ELLIPTA 100-25 MCG/INH AEPB Inhale 1 puff into the lungs daily.   buPROPion (WELLBUTRIN SR) 100 MG 12 hr tablet Take 1 tablet (100 mg total) by mouth 2 (two) times daily.   cyclobenzaprine (FLEXERIL) 10 MG tablet Take 10 mg by mouth 3 (three) times daily as needed for muscle spasms.   DULoxetine (CYMBALTA) 30 MG capsule Take 1 capsule (30 mg total) by mouth daily. Take along with 60 mg - total of 90 mg daily   DULoxetine (CYMBALTA) 60 MG capsule Take 1 capsule (60 mg total) by mouth daily. Take along with 30 mg daily   ergocalciferol (VITAMIN D2) 1.25 MG (50000 UT) capsule Take 1 capsule by mouth once a week. TAKES DIFFERENTLY - 1000 MG   etodolac (LODINE) 400 MG tablet Take by mouth.   furosemide (LASIX) 20 MG tablet TAKE 1 TABLET BY MOUTH TWICE A WEEK   gabapentin (NEURONTIN) 600 MG  tablet Take 1 tablet (600 mg total) by mouth 2 (two) times daily. (Patient taking differently: Take 900 mg by mouth as directed. 300 mg daily and 600 mg at bedtime)   meclizine (ANTIVERT) 12.5 MG tablet    medroxyPROGESTERone (DEPO-PROVERA) 150 MG/ML injection INJECT 1 ML (150 MG TOTAL) INTO THE MUSCLE EVERY 3 (THREE) MONTHS.   meloxicam (MOBIC) 7.5 MG tablet TAKE 1 TABLET BY MOUTH EVERY DAY   omeprazole (PRILOSEC) 40 MG capsule TAKE 1 CAPSULE BY MOUTH EVERY DAY   phentermine 15 MG capsule TAKE 1 CAPSULE BY MOUTH (15MG  TOTAL) EVERY MORNING   propranolol (INDERAL) 10 MG tablet TAKE 1 TABLET BY MOUTH THREE TIMES A DAY AS NEEDED (Patient taking differently: Take 60 mg by mouth. DAILY)   rosuvastatin (CRESTOR) 20 MG tablet TAKE 1 TABLET BY MOUTH EVERY DAY   Suvorexant (BELSOMRA) 10 MG TABS Take 10 mg by mouth at bedtime.   topiramate (TOPAMAX) 100 MG tablet Take 1.5 tablets (150 mg total) by mouth daily. 150 MG AM AND 200 MG PM ,BEING PRESCRIBED PER NEUROLOGY   Ubrogepant 100 MG TABS Take by mouth.   vitamin B-12 (CYANOCOBALAMIN) 1000 MCG tablet Take 1,000 mcg by mouth daily.   No facility-administered encounter medications on file as of 09/27/2021.    Allergies (verified) Azithromycin, Codeine, and Penicillins   History: Past Medical History:  Diagnosis Date   Allergic rhinitis    Anemia    Anxiety  Anxiety and depression    Carpal tunnel syndrome    COVID-19 01/15/2020   Depression    Fibroids    GERD (gastroesophageal reflux disease)    Headache(784.0)    Headache(784.0)    Heavy periods    Hyperthyroidism    right portion of thyroid removed   Muscle spasm    Neurological disorder    evaluation for ms   Painful menstrual periods    Shortness of breath    when i smoke a lot   Past Surgical History:  Procedure Laterality Date   BREAST BIOPSY Left    neg   carpel tunnel Left 2017   CESAREAN SECTION     times 2   CHOLECYSTECTOMY     THYROID LOBECTOMY     Family  History  Problem Relation Age of Onset   Osteoporosis Mother    Breast cancer Mother 5   Diabetes Father    Anxiety disorder Father    Depression Father    Post-traumatic stress disorder Father    Heart disease Father    Anxiety disorder Sister    Alcohol abuse Brother    Colon cancer Neg Hx    Ovarian cancer Neg Hx    Social History   Socioeconomic History   Marital status: Legally Separated    Spouse name: Not on file   Number of children: 2   Years of education: Not on file   Highest education level: High school graduate  Occupational History   Not on file  Tobacco Use   Smoking status: Every Day    Packs/day: 1.00    Years: 25.00    Pack years: 25.00    Types: Cigarettes    Start date: 05/22/1986   Smokeless tobacco: Never  Vaping Use   Vaping Use: Never used  Substance and Sexual Activity   Alcohol use: No    Alcohol/week: 0.0 standard drinks   Drug use: No   Sexual activity: Yes    Partners: Male    Birth control/protection: Surgical, Injection  Other Topics Concern   Not on file  Social History Narrative   Not on file   Social Determinants of Health   Financial Resource Strain: Medium Risk   Difficulty of Paying Living Expenses: Somewhat hard  Food Insecurity: Food Insecurity Present   Worried About Running Out of Food in the Last Year: Sometimes true   Ran Out of Food in the Last Year: Sometimes true  Transportation Needs: No Transportation Needs   Lack of Transportation (Medical): No   Lack of Transportation (Non-Medical): No  Physical Activity: Insufficiently Active   Days of Exercise per Week: 2 days   Minutes of Exercise per Session: 10 min  Stress: Stress Concern Present   Feeling of Stress : Very much  Social Connections: Socially Isolated   Frequency of Communication with Friends and Family: More than three times a week   Frequency of Social Gatherings with Friends and Family: More than three times a week   Attends Religious Services:  Never   Marine scientist or Organizations: No   Attends Music therapist: Never   Marital Status: Separated    Tobacco Counseling Ready to quit: Not Answered Counseling given: Not Answered   Clinical Intake:  Pre-visit preparation completed: Yes  Pain : No/denies pain     Nutritional Risks: None Diabetes: No  How often do you need to have someone help you when you read instructions, pamphlets, or other written materials  from your doctor or pharmacy?: 1 - Never What is the last grade level you completed in school?: GD  Diabetic?No  Interpreter Needed?: No  Information entered by :: Lacretia Nicks, [place]   Activities of Daily Living In your present state of health, do you have any difficulty performing the following activities: 09/27/2021  Hearing? N  Vision? N  Difficulty concentrating or making decisions? N  Walking or climbing stairs? N  Dressing or bathing? N  Doing errands, shopping? N  Preparing Food and eating ? N  Using the Toilet? N  In the past six months, have you accidently leaked urine? N  Do you have problems with loss of bowel control? N  Managing your Medications? N  Managing your Finances? N  Housekeeping or managing your Housekeeping? N  Some recent data might be hidden    Patient Care Team: Cletis Athens, MD as PCP - General (Internal Medicine)  Indicate any recent Medical Services you may have received from other than [facility] providers in the past year (date may be approximate).     Assessment:   This is a routine wellness examination for Mary Koch.  Hearing/Vision screen No results found.  Dietary issues and exercise activities discussed: Current Exercise Habits: Home exercise routine, Type of exercise: walking, Time (Minutes): 10, Frequency (Times/Week): 2, Weekly Exercise (Minutes/Week): 20, Intensity: Mild, Exercise limited by: psychological condition(s)   Goals Addressed   None    Depression Screen PHQ 2/9 Scores  09/27/2021 09/10/2021 08/10/2019  PHQ - 2 Score 3 6 3   PHQ- 9 Score 15 20 15   Some encounter information is confidential and restricted. Go to Review Flowsheets activity to see all data.    Fall Risk Fall Risk  09/27/2021 09/27/2021 09/10/2021  Falls in the past year? 0 0 0  Number falls in past yr: 0 0 0  Injury with Fall? 0 0 0  Risk for fall due to : - No Fall Risks No Fall Risks  Follow up - Falls evaluation completed Falls evaluation completed    [place]:  Any stairs in or around the home? No  If so, are there any without handrails? No  Home free of loose throw rugs in walkways, pet beds, electrical cords, etc? Yes  Adequate lighting in your home to reduce risk of falls? Yes   ASSISTIVE DEVICES UTILIZED TO PREVENT FALLS:  Life alert? No  Use of a cane, walker or w/c? No  Grab bars in the bathroom? No  Shower chair or bench in shower? No  Elevated toilet seat or a handicapped toilet? No   TIMED UP AND GO:  Was the test performed? Yes .  Length of time to ambulate: NA   Gait steady and fast without use of assistive device  Cognitive Function: MMSE - Mini Mental State Exam 09/27/2021 09/27/2021  Orientation to time 5 5  Orientation to Place 5 5  Registration 3 3  Attention/ Calculation 5 5  Recall 3 3  Language- name 2 objects 2 2  Language- repeat 1 1  Language- follow 3 step command 3 3  Language- read & follow direction 1 1  Write a sentence 1 1  Copy design 0 0  Total score 29 29     6CIT Screen 09/27/2021  What Year? 0 points  What month? 0 points  What time? 0 points  Count back from 20 0 points  Months in reverse 0 points  Repeat phrase 0 points  Total  Score 0    Immunizations Immunization History  Administered Date(s) Administered   Influenza, Quadrivalent, Recombinant, Inj, Pf 11/01/2019   Tdap 08/08/2019    TDAP status: Up to date  Flu Vaccine status: Due, Education has been provided regarding the importance  of this vaccine. Advised may receive this vaccine at local pharmacy or Health Dept. Aware to provide a copy of the vaccination record if obtained from local pharmacy or Health Dept. Verbalized acceptance and understanding.  Pneumococcal vaccine status: Declined,  Education has been provided regarding the importance of this vaccine but patient still declined. Advised may receive this vaccine at local pharmacy or Health Dept. Aware to provide a copy of the vaccination record if obtained from local pharmacy or Health Dept. Verbalized acceptance and understanding.   Covid-19 vaccine status: Declined, Education has been provided regarding the importance of this vaccine but patient still declined. Advised may receive this vaccine at local pharmacy or Health Dept.or vaccine clinic. Aware to provide a copy of the vaccination record if obtained from local pharmacy or Health Dept. Verbalized acceptance and understanding.  Qualifies for Shingles Vaccine? No   Zostavax completed No   Shingrix Completed?: No.    Education has been provided regarding the importance of this vaccine. Patient has been advised to call insurance company to determine out of pocket expense if they have not yet received this vaccine. Advised may also receive vaccine at local pharmacy or Health Dept. Verbalized acceptance and understanding.  Screening Tests Health Maintenance  Topic Date Due   COVID-19 Vaccine (1) Never done   FOOT EXAM  Never done   OPHTHALMOLOGY EXAM  Never done   URINE MICROALBUMIN  Never done   HIV Screening  Never done   Hepatitis C Screening  Never done   COLONOSCOPY (Pts 45-62yrs Insurance coverage will need to be confirmed)  Never done   PAP SMEAR-Modifier  10/30/2020   INFLUENZA VACCINE  07/30/2021   HEMOGLOBIN A1C  09/29/2021   TETANUS/TDAP  08/07/2029   HPV VACCINES  Aged Out    Health Maintenance  Health Maintenance Due  Topic Date Due   COVID-19 Vaccine (1) Never done   FOOT EXAM  Never done    OPHTHALMOLOGY EXAM  Never done   URINE MICROALBUMIN  Never done   HIV Screening  Never done   Hepatitis C Screening  Never done   COLONOSCOPY (Pts 45-58yrs Insurance coverage will need to be confirmed)  Never done   PAP SMEAR-Modifier  10/30/2020   INFLUENZA VACCINE  07/30/2021    Colorectal cancer screening: Type of screening: FOBT/FIT. Completed 2018. Repeat every 1 years  Mammogram status: Completed 11/07/2020. Repeat every year1  Bone Density status: Completed 11/07/2020. Results reflect: Bone density results: NORMAL. Repeat every 2 years.  Lung Cancer Screening: (Low Dose CT Chest recommended if Age 30-80 years, 30 pack-year currently smoking OR have quit w/in 15years.) does qualify.   Lung Cancer Screening Referral: Patient wants to think about it and will call back for referral   Additional Screening:  Hepatitis C Screening: does qualify; Completed No  Vision Screening: Recommended annual ophthalmology exams for early detection of glaucoma and other disorders of the eye. Is the patient up to date with their annual eye exam?  No  Who is the provider or what is the name of the office in which the patient attends annual eye exams? Patient does not have a provider for eye exams  If pt is not established with a provider, would they like  to be referred to a provider to establish care? No . Patient states that she will call back at later date for referral.   Dental Screening: Recommended annual dental exams for proper oral hygiene  Community Resource Referral / Chronic Care Management: CRR required this visit?  No   CCM required this visit?  No      Plan:     I have personally reviewed and noted the following in the patient's chart:   Medical and social history Use of alcohol, tobacco or illicit drugs  Current medications and supplements including opioid prescriptions. Patient is not currently taking opioid prescriptions. Functional ability and status Nutritional  status Physical activity Advanced directives List of other physicians Hospitalizations, surgeries, and ER visits in previous 12 months Vitals Screenings to include cognitive, depression, and falls Referrals and appointments  In addition, I have reviewed and discussed with patient certain preventive protocols, quality metrics, and best practice recommendations. A written personalized care plan for preventive services as well as general preventive health recommendations were provided to patient.     Lacretia Nicks, [place]   09/27/2021   Nurse Notes:  Mary Koch , Thank you for taking time to come for your Medicare Wellness Visit. I appreciate your ongoing commitment to your health goals. Please review the following plan we discussed and let me know if I can assist you in the future.   These are the goals we discussed:  Goals   None     This is a list of the screening recommended for you and due dates:  Health Maintenance  Topic Date Due   COVID-19 Vaccine (1) Never done   Complete foot exam   Never done   Eye exam for diabetics  Never done   Urine Protein Check  Never done   HIV Screening  Never done   Hepatitis C Screening: USPSTF Recommendation to screen - Ages 38-79 yo.  Never done   Colon Cancer Screening  Never done   Pap Smear  10/30/2020   Flu Shot  07/30/2021   Hemoglobin A1C  09/29/2021   Tetanus Vaccine  08/07/2029   HPV Vaccine  Aged Out       Time spent with patient 25 mn

## 2021-10-04 ENCOUNTER — Other Ambulatory Visit: Payer: Self-pay | Admitting: Internal Medicine

## 2021-10-04 ENCOUNTER — Ambulatory Visit: Payer: Medicare HMO

## 2021-10-05 ENCOUNTER — Encounter: Payer: Self-pay | Admitting: Obstetrics and Gynecology

## 2021-10-05 ENCOUNTER — Other Ambulatory Visit: Payer: Self-pay

## 2021-10-05 ENCOUNTER — Ambulatory Visit (INDEPENDENT_AMBULATORY_CARE_PROVIDER_SITE_OTHER): Payer: Medicare HMO

## 2021-10-05 DIAGNOSIS — Z3042 Encounter for surveillance of injectable contraceptive: Secondary | ICD-10-CM | POA: Diagnosis not present

## 2021-10-05 MED ORDER — MEDROXYPROGESTERONE ACETATE 150 MG/ML IM SUSP
150.0000 mg | Freq: Once | INTRAMUSCULAR | Status: AC
  Administered 2021-10-05: 150 mg via INTRAMUSCULAR

## 2021-10-05 NOTE — Progress Notes (Signed)
Date last pap: 10/30/2017. Last Depo-Provera: 07/06/2021. Side Effects if any: none. Serum HCG indicated? N/A. Depo-Provera 150 mg IM given by: Lovell Sheehan, LPN. Next appointment due .December 21, 2021-January 04, 2022.   Pt c/o break through bleeding while on depo.  Patient supplied depo injection.

## 2021-10-08 ENCOUNTER — Other Ambulatory Visit: Payer: Self-pay

## 2021-10-08 ENCOUNTER — Telehealth (INDEPENDENT_AMBULATORY_CARE_PROVIDER_SITE_OTHER): Payer: Medicare HMO | Admitting: Psychiatry

## 2021-10-08 ENCOUNTER — Encounter: Payer: Self-pay | Admitting: Psychiatry

## 2021-10-08 DIAGNOSIS — F17200 Nicotine dependence, unspecified, uncomplicated: Secondary | ICD-10-CM | POA: Diagnosis not present

## 2021-10-08 DIAGNOSIS — F1590 Other stimulant use, unspecified, uncomplicated: Secondary | ICD-10-CM

## 2021-10-08 DIAGNOSIS — F4000 Agoraphobia, unspecified: Secondary | ICD-10-CM

## 2021-10-08 DIAGNOSIS — F3342 Major depressive disorder, recurrent, in full remission: Secondary | ICD-10-CM

## 2021-10-08 DIAGNOSIS — F410 Panic disorder [episodic paroxysmal anxiety] without agoraphobia: Secondary | ICD-10-CM | POA: Diagnosis not present

## 2021-10-08 DIAGNOSIS — G4701 Insomnia due to medical condition: Secondary | ICD-10-CM | POA: Diagnosis not present

## 2021-10-08 MED ORDER — BELSOMRA 10 MG PO TABS: 10.0000 mg | ORAL_TABLET | Freq: Every day | ORAL | 1 refills | Status: DC

## 2021-10-08 MED ORDER — BUPROPION HCL ER (SR) 100 MG PO TB12: 100.0000 mg | ORAL_TABLET | Freq: Two times a day (BID) | ORAL | 0 refills | Status: DC

## 2021-10-08 NOTE — Progress Notes (Signed)
Virtual Visit via Video Note  I connected with Mary Koch on 10/08/21 at  2:40 PM EDT by a video enabled telemedicine application and verified that I am speaking with the correct person using two identifiers.  Location Provider Location : [facility] Patient Location : Home  Participants: Patient , Provider   I discussed the limitations of evaluation and management by telemedicine and the availability of in person appointments. The patient expressed understanding and agreed to proceed.    I discussed the assessment and treatment plan with the patient. The patient was provided an opportunity to ask questions and all were answered. The patient agreed with the plan and demonstrated an understanding of the instructions.   The patient was advised to call back or seek an in-person evaluation if the symptoms worsen or if the condition fails to improve as anticipated.                                                          [facility] MD OP Progress Note  10/08/2021 6:05 PM [place]  MRN:  [id number]  Chief Complaint:  Chief Complaint   Follow-up; Anxiety; Depression    HPI: Mary Koch is a 50 year old Caucasian female who lives in [place], has a history of MDD, panic disorder, agoraphobia, insomnia, tobacco use disorder, caffeine use disorder was evaluated by telemedicine today.  Patient today reports she continues to struggle with depressive symptoms, lack of motivation, low energy.  She also stays in bed excessively at least a couple of times a week.  Those nights she does have sleep problems at night.  Patient is compliant on her medications like Wellbutrin, Cymbalta.  She is interested in starting Abilify.  Patient had an echocardiogram completed and is awaiting stress test.  Patient agrees to sign a release to obtain medical records and reports.  Patient denies any suicidality, homicidality or perceptual disturbances.  Patient denies any other concerns today.  Visit Diagnosis:     ICD-10-CM   1. MDD (major depressive disorder), recurrent, in full remission ([place])  F33.42 buPROPion ER (WELLBUTRIN SR) 100 MG 12 hr tablet    2. Panic disorder  F41.0     3. Agoraphobia  F40.00     4. Insomnia due to medical condition  G47.01 Suvorexant (BELSOMRA) 10 MG TABS   depression, sleep apnea    5. Tobacco use disorder  F17.200     6. 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  Past trials of Paxil, Luvox, Klonopin, Zoloft, Seroquel, Wellbutrin, Ambien  Past Medical History:  Past Medical History:  Diagnosis Date   Allergic rhinitis    Anemia    Anxiety    Anxiety and depression    Carpal tunnel syndrome    COVID-19 01/15/2020   Depression    Fibroids    GERD (gastroesophageal reflux disease)    Headache(784.0)    Headache(784.0)    Heavy periods    Hyperthyroidism    right portion of thyroid removed   Muscle spasm    Neurological disorder    evaluation for ms   Painful menstrual periods    Shortness of breath    when i smoke a lot    Past Surgical History:  Procedure Laterality Date   BREAST BIOPSY Left    neg   carpel tunnel Left 2017   CESAREAN SECTION     times 2   CHOLECYSTECTOMY     THYROID LOBECTOMY      Family Psychiatric History: Reviewed family psychiatric history from progress note on 03/24/2018  Family History:  Family History  Problem Relation Age of Onset   Osteoporosis Mother    Breast cancer Mother 88   Diabetes Father    Anxiety disorder Father    Depression Father    Post-traumatic stress disorder Father    Heart disease Father    Lung cancer Father    Anxiety disorder Sister    Alcohol abuse Brother    Colon cancer Neg Hx    Ovarian cancer Neg Hx     Social History: Reviewed social history from progress note on 03/24/2018 Social History   Socioeconomic History   Marital status: Legally Separated    Spouse name: Not on file    Number of children: 2   Years of education: Not on file   Highest education level: High school graduate  Occupational History   Not on file  Tobacco Use   Smoking status: Every Day    Packs/day: 1.00    Years: 25.00    Pack years: 25.00    Types: Cigarettes    Start date: 05/22/1986   Smokeless tobacco: Never  Vaping Use   Vaping Use: Never used  Substance and Sexual Activity   Alcohol use: No    Alcohol/week: 0.0 standard drinks   Drug use: No   Sexual activity: Yes    Partners: Male    Birth control/protection: Surgical, Injection  Other Topics Concern   Not on file  Social History Narrative   Not on file   Social Determinants of Health   Financial Resource Strain: Medium Risk   Difficulty of Paying Living Expenses: Somewhat hard  Food Insecurity: Food Insecurity Present   Worried About Running Out of Food in the Last Year: Sometimes true   Ran Out of Food in the Last Year: Sometimes true  Transportation Needs: No Transportation Needs   Lack of Transportation (Medical): No   Lack of Transportation (Non-Medical): No  Physical Activity: Insufficiently Active   Days of Exercise per Week: 2 days   Minutes of Exercise per Session: 10 min  Stress: Stress Concern Present   Feeling of Stress : Very much  Social Connections: Socially Isolated   Frequency of Communication with Friends and Family: More than three times a week   Frequency of Social Gatherings with Friends and Family: More than three times a week   Attends Religious Services: Never   Marine scientist or Organizations: No   Attends Archivist Meetings: Never   Marital Status: Separated    Allergies:  Allergies  Allergen Reactions   Azithromycin Other (See Comments)    headache   Codeine Other (See Comments)    unknown   Penicillins Other (See Comments)    headaches    Metabolic Disorder Labs: Lab Results  Component Value Date   HGBA1C 5.2 03/30/2021   No results found for:  PROLACTIN No results found for: CHOL, TRIG, HDL, CHOLHDL, VLDL, LDLCALC Lab Results  Component Value Date   TSH 1.438 09/06/2021    Therapeutic Level Labs: No results found for: LITHIUM No results found for: VALPROATE No components found for:  CBMZ  Current Medications: Current Outpatient Medications  Medication Sig Dispense  Refill   albuterol (VENTOLIN HFA) 108 (90 Base) MCG/ACT inhaler INHALE 1 TO 2 PUFFS BY MOUTH DAILY 6.7 each 5   BREO ELLIPTA 100-25 MCG/INH AEPB Inhale 1 puff into the lungs daily. 28 each 4   buPROPion ER (WELLBUTRIN SR) 100 MG 12 hr tablet Take 1 tablet (100 mg total) by mouth 2 (two) times daily. 180 tablet 0   cyclobenzaprine (FLEXERIL) 10 MG tablet Take 10 mg by mouth 3 (three) times daily as needed for muscle spasms.     DULoxetine (CYMBALTA) 30 MG capsule Take 1 capsule (30 mg total) by mouth daily. Take along with 60 mg - total of 90 mg daily (Patient not taking: Reported on 10/05/2021) 90 capsule 0   DULoxetine (CYMBALTA) 60 MG capsule Take 1 capsule (60 mg total) by mouth daily. Take along with 30 mg daily 90 capsule 0   ergocalciferol (VITAMIN D2) 1.25 MG (50000 UT) capsule Take 1 capsule by mouth once a week. TAKES DIFFERENTLY - 1000 MG (Patient not taking: Reported on 10/05/2021)     etodolac (LODINE) 400 MG tablet Take by mouth. (Patient not taking: Reported on 10/05/2021)     furosemide (LASIX) 20 MG tablet TAKE 1 TABLET BY MOUTH TWICE A WEEK  5   gabapentin (NEURONTIN) 600 MG tablet Take 1 tablet (600 mg total) by mouth 2 (two) times daily. (Patient taking differently: Take 900 mg by mouth as directed. 300 mg daily and 600 mg at bedtime) 180 tablet 1   meclizine (ANTIVERT) 12.5 MG tablet      medroxyPROGESTERone (DEPO-PROVERA) 150 MG/ML injection INJECT 1 ML (150 MG TOTAL) INTO THE MUSCLE EVERY 3 (THREE) MONTHS. 1 mL 3   meloxicam (MOBIC) 7.5 MG tablet TAKE 1 TABLET BY MOUTH EVERY DAY (Patient not taking: Reported on 10/05/2021) 30 tablet 3   omeprazole  (PRILOSEC) 40 MG capsule TAKE 1 CAPSULE BY MOUTH EVERY DAY 90 capsule 3   phentermine 15 MG capsule TAKE 1 CAPSULE BY MOUTH (15MG  TOTAL) EVERY MORNING (Patient not taking: Reported on 10/05/2021) 30 capsule 0   propranolol (INDERAL) 10 MG tablet TAKE 1 TABLET BY MOUTH THREE TIMES A DAY AS NEEDED (Patient taking differently: Take 60 mg by mouth. DAILY) 270 tablet 1   rosuvastatin (CRESTOR) 20 MG tablet TAKE 1 TABLET BY MOUTH EVERY DAY 90 tablet 3   Suvorexant (BELSOMRA) 10 MG TABS Take 10 mg by mouth at bedtime. 30 tablet 1   topiramate (TOPAMAX) 100 MG tablet Take 1.5 tablets (150 mg total) by mouth daily. 150 MG AM AND 200 MG PM ,BEING PRESCRIBED PER NEUROLOGY 135 tablet 1   Ubrogepant 100 MG TABS Take by mouth.     vitamin B-12 (CYANOCOBALAMIN) 1000 MCG tablet Take 1,000 mcg by mouth daily. (Patient not taking: Reported on 10/05/2021)     No current facility-administered medications for this visit.     Musculoskeletal: Strength & Muscle Tone:  UTA Gait & Station:  Seated Patient leans: N/A  Psychiatric Specialty Exam: Review of Systems  Psychiatric/Behavioral:  Positive for dysphoric mood and sleep disturbance.   All other systems reviewed and are negative.  There were no vitals taken for this visit.There is no height or weight on file to calculate BMI.  General Appearance: Casual  Eye Contact:  Fair  Speech:  Clear and Coherent  Volume:  Normal  Mood:  Depressed  Affect:  Congruent  Thought Process:  Goal Directed and Descriptions of Associations: Intact  Orientation:  Full (Time, Place, and Person)  Thought Content: Logical   Suicidal Thoughts:  No  Homicidal Thoughts:  No  Memory:  Immediate;   Fair Recent;   Fair Remote;   Fair  Judgement:  Fair  Insight:  Fair  Psychomotor Activity:  Normal  Concentration:  Concentration: Fair and Attention Span: Fair  Recall:  [employer] of Knowledge: Fair  Language: Fair  Akathisia:  No  Handed:  Right  AIMS (if indicated): not  done  Assets:  Communication Skills Desire for [place] Talents/Skills Transportation  ADL's:  Intact  Cognition: WNL  Sleep:   restless   Screenings: AUDIT    Flowsheet Row Office Visit from 05/22/2016 in [place]  Alcohol Use Disorder Identification Test Final Score (AUDIT) 0      GAD-7    Flowsheet Row Counselor from 10/06/2020 in [place] Office Visit from 08/10/2019 in [facility]  Total GAD-7 Score 15 14      Mini-Mental    [place] from 09/27/2021 in [facility]  Total Score (max 30 points ) 29      PHQ2-9    [place] from 09/27/2021 in [facility] Office Visit from 09/10/2021 in [place] Video Visit from 09/05/2021 in [place] Video Visit from 07/09/2021 in  [place] Video Visit from 02/26/2021 in [place]  PHQ-2 Total Score 3 6 6  0 2  PHQ-9 Total Score 15 20 24  -- 12      Flowsheet Row Video Visit from 02/26/2021 in [place] Counselor from 02/22/2021 in [place] No Risk No Risk        Assessment and Plan: Mary Koch is a 50 year old Caucasian female who has a history of MDD, panic attacks, tobacco use disorder, caffeine use disorder, insomnia was evaluated by telemedicine today.  Patient with recent EKG abnormality, completed echocardiogram, awaiting stress test.  Patient with depression symptoms and will benefit from starting Abilify however will review results prior to starting this medication.  This was discussed with patient.  Plan MDD-unstable Cymbalta 90 mg p.o. daily-reduced dosage Wellbutrin SR 100 mg p.o. twice daily We will consider starting Abilify-patient to sign a release to send  echocardiogram report. In the meantime patient has been referred for psychotherapy sessions-patient to continue the same.  Panic disorder-improving Gabapentin 900 mg p.o. daily in divided dosage.  Prescribed by neurology. Cymbalta 90 mg p.o. daily  Agoraphobia-stable Restart CBT  Insomnia-unstable Patient to work on sleep hygiene. Continue Belsomra 10 mg p.o. nightly. She is sleeping well 5 out of 7 days a week.  Sleep problems likely due to excessive sleepiness during the day couple of times a week.   Tobacco use disorder-unstable We will monitor closely Patient is also on Wellbutrin.  Caffeine use disorder-improving Provided counseling  Follow-up in clinic in 10 days or sooner if needed.  This note was generated in part or whole with voice recognition software. Voice recognition is usually quite accurate but there are transcription errors that can and very often do occur. I apologize for any typographical errors that were not detected and corrected.        Ursula Alert, MD 10/09/2021, 8:26 AM

## 2021-10-22 ENCOUNTER — Telehealth (INDEPENDENT_AMBULATORY_CARE_PROVIDER_SITE_OTHER): Payer: Medicare HMO | Admitting: Psychiatry

## 2021-10-22 ENCOUNTER — Encounter: Payer: Self-pay | Admitting: Psychiatry

## 2021-10-22 ENCOUNTER — Other Ambulatory Visit: Payer: Self-pay

## 2021-10-22 DIAGNOSIS — F17200 Nicotine dependence, unspecified, uncomplicated: Secondary | ICD-10-CM | POA: Diagnosis not present

## 2021-10-22 DIAGNOSIS — G4701 Insomnia due to medical condition: Secondary | ICD-10-CM

## 2021-10-22 DIAGNOSIS — F4000 Agoraphobia, unspecified: Secondary | ICD-10-CM | POA: Diagnosis not present

## 2021-10-22 DIAGNOSIS — F1590 Other stimulant use, unspecified, uncomplicated: Secondary | ICD-10-CM | POA: Diagnosis not present

## 2021-10-22 DIAGNOSIS — F410 Panic disorder [episodic paroxysmal anxiety] without agoraphobia: Secondary | ICD-10-CM | POA: Diagnosis not present

## 2021-10-22 DIAGNOSIS — F331 Major depressive disorder, recurrent, moderate: Secondary | ICD-10-CM | POA: Diagnosis not present

## 2021-10-22 MED ORDER — BUPROPION HCL ER (SR) 100 MG PO TB12: 100.0000 mg | ORAL_TABLET | Freq: Every day | ORAL | 0 refills | Status: DC

## 2021-10-22 MED ORDER — ARIPIPRAZOLE 2 MG PO TABS
2.0000 mg | ORAL_TABLET | Freq: Every day | ORAL | 0 refills | Status: DC
Start: 2021-10-22 — End: 2021-11-21

## 2021-10-22 NOTE — Progress Notes (Signed)
Virtual Visit via Video Note  I connected with Mary Koch on 10/22/21 at  3:00 PM EDT by a video enabled telemedicine application and verified that I am speaking with the correct person using two identifiers.  Location Provider Location : [facility] Patient Location : Home  Participants: Patient , Provider    I discussed the limitations of evaluation and management by telemedicine and the availability of in person appointments. The patient expressed understanding and agreed to proceed.    I discussed the assessment and treatment plan with the patient. The patient was provided an opportunity to ask questions and all were answered. The patient agreed with the plan and demonstrated an understanding of the instructions.   The patient was advised to call back or seek an in-person evaluation if the symptoms worsen or if the condition fails to improve as anticipated.    [place] MD OP Progress Note  10/22/2021 4:28 PM Mary Koch  MRN:  [id number]  Chief Complaint:  Chief Complaint   Follow-up; Depression    HPI: Mary Koch is a 50 year old Caucasian female who lives in [place], has a history of MDD, panic disorder, agoraphobia, insomnia, tobacco use disorder, caffeine use disorder was evaluated by telemedicine today.  Patient today reports she continues to struggle with depressive symptoms like sadness, low energy, lack of motivation.  Patient reports she is interested in trying the Abilify which was discussed previously.  She continues to follow-up with cardiology and currently reports she is doing well and had recent echocardiogram done which was normal.  Patient denies any suicidality, homicidality or perceptual disturbances.  She is compliant on her medications, denies side effects.  Patient continues to smoke cigarettes and is interested in cutting back.  She denies any other concerns today.  Visit Diagnosis:    ICD-10-CM   1. MDD (major depressive disorder), recurrent  episode, moderate ([facility])  F33.1 buPROPion ER (WELLBUTRIN SR) 100 MG 12 hr tablet    ARIPiprazole (ABILIFY) 2 MG tablet    2. Panic disorder  F41.0     3. Agoraphobia  F40.00     4. Insomnia due to medical condition  G47.01    mood, osa    5. Tobacco use disorder  F17.200     6. 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  Past trials of Paxil, Luvox, Klonopin, Zoloft, Seroquel, Wellbutrin, Ambien  Past Medical History:  Past Medical History:  Diagnosis Date   Allergic rhinitis    Anemia    Anxiety    Anxiety and depression    Carpal tunnel syndrome    COVID-19 01/15/2020   Depression    Fibroids    GERD (gastroesophageal reflux disease)    Headache(784.0)    Headache(784.0)    Heavy periods    Hyperthyroidism    right portion of thyroid removed   Muscle spasm    Neurological disorder    evaluation for ms   Painful menstrual periods    Shortness of breath    when i smoke a lot    Past Surgical History:  Procedure Laterality Date   BREAST BIOPSY Left    neg   carpel tunnel Left 2017   CESAREAN SECTION     times 2   CHOLECYSTECTOMY     THYROID LOBECTOMY      Family Psychiatric History: Reviewed family psychiatric history from progress note on 03/24/2018  Family History:  Family History  Problem Relation Age of Onset   Osteoporosis Mother    Breast cancer Mother 75   Diabetes Father    Anxiety disorder Father    Depression Father    Post-traumatic stress disorder Father    Heart disease Father    Lung cancer Father    Anxiety disorder Sister    Alcohol abuse Brother    Colon cancer Neg Hx    Ovarian cancer Neg Hx     Social History: Reviewed social history from progress note on 03/24/2018 Social History   Socioeconomic History   Marital status: Legally Separated    Spouse name: Not on file   Number of children: 2   Years of education: Not on file   Highest  education level: High school graduate  Occupational History   Not on file  Tobacco Use   Smoking status: Every Day    Packs/day: 1.00    Years: 25.00    Pack years: 25.00    Types: Cigarettes    Start date: 05/22/1986   Smokeless tobacco: Never  Vaping Use   Vaping Use: Never used  Substance and Sexual Activity   Alcohol use: No    Alcohol/week: 0.0 standard drinks   Drug use: No   Sexual activity: Yes    Partners: Male    Birth control/protection: Surgical, Injection  Other Topics Concern   Not on file  Social History Narrative   Not on file   Social Determinants of Health   Financial Resource Strain: Medium Risk   Difficulty of Paying Living Expenses: Somewhat hard  Food Insecurity: Food Insecurity Present   Worried About Running Out of Food in the Last Year: Sometimes true   Ran Out of Food in the Last Year: Sometimes true  Transportation Needs: No Transportation Needs   Lack of Transportation (Medical): No   Lack of Transportation (Non-Medical): No  Physical Activity: Insufficiently Active   Days of Exercise per Week: 2 days   Minutes of Exercise per Session: 10 min  Stress: Stress Concern Present   Feeling of Stress : Very much  Social Connections: Socially Isolated   Frequency of Communication with Friends and Family: More than three times a week   Frequency of Social Gatherings with Friends and Family: More than three times a week   Attends Religious Services: Never   Marine scientist or Organizations: No   Attends Archivist Meetings: Never   Marital Status: Separated    Allergies:  Allergies  Allergen Reactions   Azithromycin Other (See Comments)    headache   Codeine Other (See Comments)    unknown   Penicillins Other (See Comments)    headaches    Metabolic Disorder Labs: Lab Results  Component Value Date   HGBA1C 5.2 03/30/2021   No results found for: PROLACTIN No results found for: CHOL, TRIG, HDL, CHOLHDL, VLDL,  LDLCALC Lab Results  Component Value Date   TSH 1.438 09/06/2021    Therapeutic Level Labs: No results found for: LITHIUM No results found for: VALPROATE No components found for:  CBMZ  Current Medications: Current Outpatient Medications  Medication Sig Dispense Refill   ARIPiprazole (ABILIFY) 2 MG tablet Take 1 tablet (2 mg total) by mouth daily with breakfast. 30 tablet 0   albuterol (VENTOLIN HFA) 108 (90 Base) MCG/ACT inhaler INHALE 1 TO 2 PUFFS BY MOUTH DAILY 6.7 each 5   BREO ELLIPTA 100-25 MCG/INH AEPB Inhale 1 puff into the lungs daily. [place]  each 4   buPROPion ER (WELLBUTRIN SR) 100 MG 12 hr tablet Take 1 tablet (100 mg total) by mouth daily. 180 tablet 0   cyclobenzaprine (FLEXERIL) 10 MG tablet Take 10 mg by mouth 3 (three) times daily as needed for muscle spasms.     DULoxetine (CYMBALTA) 30 MG capsule Take 1 capsule (30 mg total) by mouth daily. Take along with 60 mg - total of 90 mg daily (Patient not taking: Reported on 10/05/2021) 90 capsule 0   DULoxetine (CYMBALTA) 60 MG capsule Take 1 capsule (60 mg total) by mouth daily. Take along with 30 mg daily 90 capsule 0   ergocalciferol (VITAMIN D2) 1.25 MG (50000 UT) capsule Take 1 capsule by mouth once a week. TAKES DIFFERENTLY - 1000 MG (Patient not taking: Reported on 10/05/2021)     etodolac (LODINE) 400 MG tablet Take by mouth. (Patient not taking: Reported on 10/05/2021)     furosemide (LASIX) 20 MG tablet TAKE 1 TABLET BY MOUTH TWICE A WEEK  5   gabapentin (NEURONTIN) 600 MG tablet Take 1 tablet (600 mg total) by mouth 2 (two) times daily. (Patient taking differently: Take 900 mg by mouth as directed. 300 mg daily and 600 mg at bedtime) 180 tablet 1   meclizine (ANTIVERT) 12.5 MG tablet      medroxyPROGESTERone (DEPO-PROVERA) 150 MG/ML injection INJECT 1 ML (150 MG TOTAL) INTO THE MUSCLE EVERY 3 (THREE) MONTHS. 1 mL 3   meloxicam (MOBIC) 7.5 MG tablet TAKE 1 TABLET BY MOUTH EVERY DAY (Patient not taking: Reported on 10/05/2021)  30 tablet 3   omeprazole (PRILOSEC) 40 MG capsule TAKE 1 CAPSULE BY MOUTH EVERY DAY 90 capsule 3   phentermine 15 MG capsule TAKE 1 CAPSULE BY MOUTH (15MG  TOTAL) EVERY MORNING (Patient not taking: Reported on 10/05/2021) 30 capsule 0   propranolol (INDERAL) 10 MG tablet TAKE 1 TABLET BY MOUTH THREE TIMES A DAY AS NEEDED (Patient taking differently: Take 60 mg by mouth. DAILY) 270 tablet 1   rosuvastatin (CRESTOR) 20 MG tablet TAKE 1 TABLET BY MOUTH EVERY DAY 90 tablet 3   Suvorexant (BELSOMRA) 10 MG TABS Take 10 mg by mouth at bedtime. 30 tablet 1   topiramate (TOPAMAX) 100 MG tablet Take 1.5 tablets (150 mg total) by mouth daily. 150 MG AM AND 200 MG PM ,BEING PRESCRIBED PER NEUROLOGY 135 tablet 1   Ubrogepant 100 MG TABS Take by mouth.     vitamin B-12 (CYANOCOBALAMIN) 1000 MCG tablet Take 1,000 mcg by mouth daily. (Patient not taking: Reported on 10/05/2021)     No current facility-administered medications for this visit.     Musculoskeletal: Strength & Muscle Tone:  UTA Gait & Station:  WNL Patient leans: N/A  Psychiatric Specialty Exam: Review of Systems  Constitutional:  Positive for fatigue.  Psychiatric/Behavioral:  Positive for decreased concentration, dysphoric mood and sleep disturbance.   All other systems reviewed and are negative.  There were no vitals taken for this visit.There is no height or weight on file to calculate BMI.  General Appearance: Casual  Eye Contact:  Fair  Speech:  Clear and Coherent  Volume:  Normal  Mood:  Depressed  Affect:  Congruent  Thought Process:  Goal Directed and Descriptions of Associations: Intact  Orientation:  Full (Time, Place, and Person)  Thought Content: Logical   Suicidal Thoughts:  No  Homicidal Thoughts:  No  Memory:  Immediate;   Fair Recent;   Fair Remote;   Fair  Judgement:  Fair  Insight:  Fair  Psychomotor Activity:  Normal  Concentration:  Concentration: Fair and Attention Span: Fair  Recall:  [employer] of  Knowledge: Fair  Language: Fair  Akathisia:  No  Handed:  Right  AIMS (if indicated): done  Assets:  Communication Skills Desire for Improvement Housing Social Support  ADL's:  Intact  Cognition: WNL  Sleep:   Restless at time   Screenings: AUDIT    [place] Office Visit from 05/22/2016 in [place]  Alcohol Use Disorder Identification Test Final Score (AUDIT) 0      GAD-7    Flowsheet Row Counselor from 10/06/2020 in [place] Office Visit from 08/10/2019 in [facility]  Total GAD-7 Score 15 14      Mini-Mental    [place] from 09/27/2021 in [facility]  Total Score (max 30 points ) 29      PHQ2-9    [place] from 09/27/2021 in [facility] Office Visit from 09/10/2021 in [place] Video Visit from 09/05/2021 in [place] Video Visit from 07/09/2021 in [place] Video Visit from 02/26/2021 in [place]  PHQ-2 Total Score 3 6 6  0 2  PHQ-9 Total Score 15 20 24  -- 12      Flowsheet Row Video Visit from 02/26/2021 in [place] Counselor from 02/22/2021 in [place] No Risk No Risk        Assessment and Plan: Mary Koch is a 50 year old Caucasian female who has a history of MDD, panic attacks, tobacco use disorder, caffeine use disorder, insomnia was evaluated by telemedicine today.  Patient is currently struggling with depression, sleep problems, will benefit from the following plan.  Plan MDD-unstable Cymbalta 90 mg p.o. daily-reduced dosage Reduce Wellbutrin SR 200 mg p.o. daily in the morning with plan to taper it off. Start Abilify 2 mg p.o. daily with breakfast Provided medication education. AIMS - 0  Panic  disorder-improving Gabapentin 900 mg p.o. daily in divided dosage-prescribed by neurology Cymbalta 90 mg p.o. daily  Agoraphobia-stable Patient was advised to restart CBT as needed  Tobacco use disorder-improving We will monitor closely.  Provided counseling for 2 minutes.  Caffeine use disorder-improving Provided counseling  Follow-up in clinic in 2 weeks or sooner in person.  This note was generated in part or whole with voice recognition software. Voice recognition is usually quite accurate but there are transcription errors that can and very often do occur. I apologize for any typographical errors that were not detected and corrected.       Ursula Alert, MD 10/23/2021, 8:39 AM

## 2021-10-22 NOTE — Patient Instructions (Signed)
Aripiprazole Tablets What is this medication? ARIPIPRAZOLE (ay ri PIP ray zole) treats schizophrenia, bipolar I disorder, autism spectrum disorder, and Tourette disorder. It may also be used with antidepressant medications to treat depression. It works by balancing the levels of dopamine and serotonin in the brain, hormones that help regulate mood, behaviors, and thoughts. It belongs to a group of medications called antipsychotics. Antipsychotics can be used to treat several kinds of mental health conditions. This medicine may be used for other purposes; ask your health care provider or pharmacist if you have questions. COMMON BRAND NAME(S): Abilify What should I tell my care team before I take this medication? They need to know if you have any of these conditions: Dementia Diabetes Difficulty swallowing Have trouble controlling your muscles Have urges you are unable to control (for example, gambling, spending money, or eating) Heart disease History of irregular heartbeat History of stroke Low blood counts, like low white cell, platelet, or red cell counts Low blood pressure Parkinson disease Seizures Suicidal thoughts, plans or attempt; a previous suicide attempt by you or a family member An unusual or allergic reaction to aripiprazole, other medications, foods, dyes, or preservatives Pregnant or trying to get pregnant Breast-feeding How should I use this medication? Take this medication by mouth with a glass of water. Follow the directions on the prescription label. You can take this medication with or without food. Take your doses at regular intervals. Do not take your medication more often than directed. Do not stop taking except on the advice of your care team. A special MedGuide will be given to you by the pharmacist with each prescription and refill. Be sure to read this information carefully each time. Talk to your care team regarding the use of this medication in children. While  this medication may be prescribed for children as young as 6 years of age for selected conditions, precautions do apply. Overdosage: If you think you have taken too much of this medicine contact a poison control center or emergency room at once. NOTE: This medicine is only for you. Do not share this medicine with others. What if I miss a dose? If you miss a dose, take it as soon as you can. If it is almost time for your next dose, take only that dose. Do not take double or extra doses. What may interact with this medication? Do not take this medication with any of the following: Brexpiprazole Cisapride Dextromethorphan; quinidine Dronedarone Metoclopramide Pimozide Quinidine Thioridazine This medication may also interact with the following: Antihistamines for allergy, cough, and cold Carbamazepine Certain medications for anxiety or sleep Certain medications for depression like amitriptyline, fluoxetine, paroxetine, sertraline Certain medications for fungal infections like fluconazole, itraconazole, ketoconazole, posaconazole, voriconazole Clarithromycin General anesthetics like halothane, isoflurane, methoxyflurane, propofol Levodopa or other medications for Parkinson's disease Medications for blood pressure Medications for seizures Medications that relax muscles for surgery Narcotic medications for pain Other medications that prolong the QT interval (cause an abnormal heart rhythm) Phenothiazines like chlorpromazine, prochlorperazine Rifampin This list may not describe all possible interactions. Give your health care provider a list of all the medicines, herbs, non-prescription drugs, or dietary supplements you use. Also tell them if you smoke, drink alcohol, or use illegal drugs. Some items may interact with your medicine. What should I watch for while using this medication? Visit your care team for regular checks on your progress. Tell your care team if symptoms do not start to  get better or if they get worse. Do not   stop taking except on your care team's advice. You may develop a severe reaction. Your care team will tell you how much medication to take. Patients and their families should watch out for new or worsening depression or thoughts of suicide. Also watch out for sudden changes in feelings such as feeling anxious, agitated, panicky, irritable, hostile, aggressive, impulsive, severely restless, overly excited and hyperactive, or not being able to sleep. If this happens, especially at the beginning of antidepressant treatment or after a change in dose, call your care team. You may get dizzy or drowsy. Do not drive, use machinery, or do anything that needs mental alertness until you know how this medication affects you. Do not stand or sit up quickly, especially if you are an older patient. This reduces the risk of dizzy or fainting spells. Alcohol may interfere with the effect of this medication. Avoid alcoholic drinks. This medication can cause problems with controlling your body temperature. It can lower the response of your body to cold temperatures. If possible, stay indoors during cold weather. If you must go outdoors, wear warm clothes. It can also lower the response of your body to heat. Do not overheat. Do not over-exercise. Stay out of the sun when possible. If you must be in the sun, wear cool clothing. Drink plenty of water. If you have trouble controlling your body temperature, call your care team right away. This medication may cause dry eyes and blurred vision. If you wear contact lenses, you may feel some discomfort. Lubricating drops may help. See your eye care specialist if the problem does not go away or is severe. This medication may increase blood sugar. Ask your care team if changes in diet or medications are needed if you have diabetes. There have been reports of increased sexual urges or other strong urges such as gambling while taking this medication.  If you experience any of these while taking this medication, you should report this to your care team as soon as possible. What side effects may I notice from receiving this medication? Side effects that you should report to your care team as soon as possible: Allergic reactions-skin rash, itching, hives, swelling of the face, lips, tongue, or throat High blood sugar (hyperglycemia)-increased thirst or amount of urine, unusual weakness or fatigue, blurry vision High fever, stiff muscles, increased sweating, fast or irregular heartbeat, and confusion, which may be signs of neuroleptic malignant syndrome Low blood pressure-dizziness, feeling faint or lightheaded, blurry vision Pain or trouble swallowing Prolonged or painful erection Seizures Stroke-sudden numbness or weakness of the face, arm, or leg, trouble speaking, confusion, trouble walking, loss of balance or coordination, dizziness, severe headache, change in vision Uncontrolled and repetitive body movements, muscle stiffness or spasms, tremors or shaking, loss of balance or coordination, restlessness, shuffling walk, which may be signs of extrapyramidal symptoms (EPS) Thoughts of suicide or self-harm, worsening mood, feelings of depression Urges to engage in impulsive behaviors such as gambling, binge eating, sexual activity, or shopping in ways that are unusual for you Side effects that usually do not require medical attention (report these to your care team if they continue or are bothersome): Constipation Drowsiness Weight gain This list may not describe all possible side effects. Call your doctor for medical advice about side effects. You may report side effects to FDA at 1-800-FDA-1088. Where should I keep my medication? Keep out of the reach of children and pets. Store at room temperature between 15 and 30 degrees C (59 and 86 degrees F).  Throw away any unused medication after the expiration date. NOTE: This sheet is a summary. It  may not cover all possible information. If you have questions about this medicine, talk to your doctor, pharmacist, or health care provider.  2022 Elsevier/Gold Standard (2021-03-23 16:39:02)

## 2021-10-29 ENCOUNTER — Ambulatory Visit: Payer: Medicare HMO | Admitting: Psychiatry

## 2021-11-21 ENCOUNTER — Other Ambulatory Visit: Payer: Self-pay | Admitting: Psychiatry

## 2021-11-21 DIAGNOSIS — F331 Major depressive disorder, recurrent, moderate: Secondary | ICD-10-CM

## 2021-11-28 ENCOUNTER — Telehealth (INDEPENDENT_AMBULATORY_CARE_PROVIDER_SITE_OTHER): Payer: Medicare HMO | Admitting: Psychiatry

## 2021-11-28 ENCOUNTER — Ambulatory Visit: Payer: Medicare HMO

## 2021-11-28 ENCOUNTER — Other Ambulatory Visit: Payer: Self-pay

## 2021-11-28 ENCOUNTER — Encounter: Payer: Self-pay | Admitting: Psychiatry

## 2021-11-28 DIAGNOSIS — F4000 Agoraphobia, unspecified: Secondary | ICD-10-CM

## 2021-11-28 DIAGNOSIS — F1590 Other stimulant use, unspecified, uncomplicated: Secondary | ICD-10-CM

## 2021-11-28 DIAGNOSIS — F410 Panic disorder [episodic paroxysmal anxiety] without agoraphobia: Secondary | ICD-10-CM | POA: Diagnosis not present

## 2021-11-28 DIAGNOSIS — G4701 Insomnia due to medical condition: Secondary | ICD-10-CM

## 2021-11-28 DIAGNOSIS — F331 Major depressive disorder, recurrent, moderate: Secondary | ICD-10-CM

## 2021-11-28 DIAGNOSIS — F17200 Nicotine dependence, unspecified, uncomplicated: Secondary | ICD-10-CM

## 2021-11-28 MED ORDER — DULOXETINE HCL 30 MG PO CPEP: 30.0000 mg | ORAL_CAPSULE | Freq: Every day | ORAL | 0 refills | Status: DC

## 2021-11-28 MED ORDER — BREXPIPRAZOLE 0.25 MG PO TABS: 0.2500 mg | ORAL_TABLET | Freq: Every day | ORAL | 1 refills | Status: DC

## 2021-11-28 MED ORDER — DULOXETINE HCL 60 MG PO CPEP: 60.0000 mg | ORAL_CAPSULE | Freq: Every day | ORAL | 0 refills | Status: DC

## 2021-11-28 NOTE — Patient Instructions (Signed)
Brexpiprazole Oral Tablets What is this medication? BREXPIPRAZOLE (brex PIP ray zole) treats schizophrenia. It works by adjusting levels of dopamine and serotonin in the brain, chemicals that help regulate mood and thoughts. It belongs to a group of medications called atypical antipsychotics. It may also be used in combination with antidepressants to treat major depressive disorder. This medicine may be used for other purposes; ask your health care provider or pharmacist if you have questions. COMMON BRAND NAME(S): REXULTI What should I tell my care team before I take this medication? They need to know if you have any of these conditions: dementia diabetes difficulty swallowing have trouble controlling your muscles have urges you are unable to control (for example, gambling, spending money, or eating) heart disease high cholesterol history of breast cancer history of stroke kidney disease liver disease low blood counts, like low white cell, platelet, or red cell counts low blood pressure Parkinson's disease seizures suicidal thoughts, plans or attempt; a previous suicide attempt by you or a family member an unusual or allergic reaction to brexpiprazole, other medicines, foods, dyes, or preservatives pregnant or trying to get pregnant breast-feeding How should I use this medication? Take this medication by mouth with water. Take it as directed on the prescription label at the same time every day. You can take it with or without food. If it upsets your stomach, take it with food. Keep taking this medication unless your care team tells you to stop. Stopping it too quickly can cause serious side effects. It can also make your condition worse. A special MedGuide will be given to you by the pharmacist with each prescription and refill. Be sure to read this information carefully each time. Talk to your care team about the use of this medication in children. While it may be prescribed for  children as young as 13 years for selected conditions, precautions do apply. Overdosage: If you think you have taken too much of this medicine contact a poison control center or emergency room at once. NOTE: This medicine is only for you. Do not share this medicine with others. What if I miss a dose? If you miss a dose, take it as soon as you can. If it is almost time for your next dose, take only that dose. Do not take double or extra doses. What may interact with this medication? Do not take this medicine with any of the following medications: aripiprazole metoclopramide This medicine may also interact with the following medications: antihistamines for allergy, cough, and cold certain medicines for anxiety or sleep certain medicines for depression like amitriptyline, duloxetine, fluoxetine, paroxetine, sertraline certain medicines for fungal infections like fluconazole, itraconazole, ketoconazole clarithromycin general anesthetics like halothane, isoflurane, methoxyflurane, propofol levodopa or other medicines for Parkinson's disease medicines for blood pressure medicines that relax muscles for surgery medicines for seizures narcotic medicines for pain phenothiazines like chlorpromazine, prochlorperazine, thioridazine quinidine rifampin St. John's Wort This list may not describe all possible interactions. Give your health care provider a list of all the medicines, herbs, non-prescription drugs, or dietary supplements you use. Also tell them if you smoke, drink alcohol, or use illegal drugs. Some items may interact with your medicine. What should I watch for while using this medication? Visit your care team for regular checks on your progress. Tell your care team if symptoms do not start to get better or if they get worse. Do not stop taking except on your care team's advice. You may develop a severe reaction. Your care team will  tell you how much medication to take. Patients and their  families should watch out for new or worsening depression or thoughts of suicide. Also watch out for sudden changes in feelings such as feeling anxious, agitated, panicky, irritable, hostile, aggressive, impulsive, severely restless, overly excited and hyperactive, or not being able to sleep. If this happens, especially at the beginning of antidepressant treatment or after a change in dose, call your care team. You may get dizzy or drowsy. Do not drive, use machinery, or do anything that needs mental alertness until you know how this medication affects you. Do not stand or sit up quickly, especially if you are an older patient. This reduces the risk of dizzy or fainting spells. Alcohol may interfere with the effect of this medication. Avoid alcoholic drinks. There are have been reports of increased sexual urges or other strong urges such as gambling while taking this medication. If you experience any of these while taking this medication, you should report this to your care team as soon as possible. This medication may cause dry eyes and blurred vision. If you wear contact lenses you may feel some discomfort. Lubricating drops may help. See your eye doctor if the problem does not go away or is severe. This medication may increase blood sugar. Ask your care team if changes in diet or medications are needed if you have diabetes. This medication can cause problems with controlling your body temperature. It can lower the response of your body to cold temperatures. If possible, stay indoors during cold weather. If you must go outdoors, wear warm clothes. It can also lower the response of your body to heat. Do not overheat. Do not over-exercise. Stay out of the sun when possible. If you must be in the sun, wear cool clothing. Drink plenty of water. If you have trouble controlling your body temperature, call your care team right away. What side effects may I notice from receiving this medication? Side effects that  you should report to your doctor or health care professional as soon as possible: Alergic reactions--skin rash, itching, hives, swelling of the face, lips, tongue, or throat Confusion Elevated mood, decreased need for sleep, racing thoughts, impulsive behavior Fever, chills, sore throat Inability to keep still New or increased gambling urges, sexual urges, uncontrolled spending, binge or compulsive eating, or other urges Problems with balance, talking, walking Seizures High blood sugar--increased thirst or amount of urine, unusual weakness or fatigue, blurry vision Low blood pressure--dizziness, feeling faint or lightheaded, blurry vision Neuroleptic malignant syndrome--confusion, fast or irregular heartbeat, high fever, increased sweating, stiff muscles Sudden numbness or weakness of the face, arm, or leg Thoughts of suicide or self-harm, worsening mood, feelings of depression Trouble breathing Trouble swallowing Uncontrollable movements of the arms, face, head, mouth, neck, or upper body Side effects that usually do not require medical attention (report to your doctor or health care professional if they continue or are bothersome): Constipation Drowsiness Headache Restlessness Weight gain This list may not describe all possible side effects. Call your doctor for medical advice about side effects. You may report side effects to FDA at 1-800-FDA-1088. Where should I keep my medication? Keep out of the reach of children and pets. Store at room temperature between 20 and 25 degrees C (68 and 77 degrees F). Get rid of any unused medication after the expiration date. To get rid of medications that are no longer needed or have expired: Take the medication to a medication take-back program. Check with your pharmacy or  law enforcement to find a location. If you cannot return the medication, check the label or package insert to see if the medication should be thrown out in the garbage or flushed  down the toilet. If you are not sure, ask your care team. If it is safe to put it in the trash, take the medication out of the container. Mix the medication with cat litter, dirt, coffee grounds, or other unwanted substance. Seal the mixture in a bag or container. Put it in the trash. NOTE: This sheet is a summary. It may not cover all possible information. If you have questions about this medicine, talk to your doctor, pharmacist, or health care provider.  2022 Elsevier/Gold Standard (2021-09-04 00:00:00)

## 2021-11-28 NOTE — Progress Notes (Signed)
Virtual Visit via Video Note  I connected with Mary Koch on 11/28/21 at  9:00 AM EST by a video enabled telemedicine application and verified that I am speaking with the correct person using two identifiers.  Location Provider Location : [facility] Patient Location : Home  Participants: Patient , Provider   I discussed the limitations of evaluation and management by telemedicine and the availability of in person appointments. The patient expressed understanding and agreed to proceed.   I discussed the assessment and treatment plan with the patient. The patient was provided an opportunity to ask questions and all were answered. The patient agreed with the plan and demonstrated an understanding of the instructions.   The patient was advised to call back or seek an in-person evaluation if the symptoms worsen or if the condition fails to improve as anticipated.  [facility] MD OP Progress Note  11/28/2021 9:28 AM Mary Koch  MRN:  [id number]  Chief Complaint:  Chief Complaint   Follow-up; Depression    HPI: Mary Koch is a 50 year old Caucasian female who lives in [place], has a history of MDD, panic disorder, agoraphobia, insomnia, tobacco use disorder, caffeine use disorder was evaluated by telemedicine today.  Patient reports she and her family got sick with flulike symptoms recently.  She reports she continues to have cough which is productive.  She agrees to follow-up with her primary care provider since it is not getting any better.  Patient reports she stopped taking the Abilify since she had side effects of headaches.  She continues to struggle with depressive symptoms like sadness, lack of motivation, low energy, anhedonia.  She also reports the cough is making her sleep restless since the past few weeks.  Patient denies any suicidality, homicidality or perceptual disturbances.  Patient is compliant on her other medications and is currently on a lower dosage of Wellbutrin as  discussed last visit.  Patient is interested in cutting back on smoking cigarettes, has been trying to do so, she reports she has good days and bad days.  Patient denies any other concerns today.  Visit Diagnosis:    ICD-10-CM   1. MDD (major depressive disorder), recurrent episode, moderate (HCC)  F33.1 brexpiprazole (REXULTI) 0.25 MG TABS tablet    DULoxetine (CYMBALTA) 60 MG capsule    DULoxetine (CYMBALTA) 30 MG capsule    2. Panic disorder  F41.0 brexpiprazole (REXULTI) 0.25 MG TABS tablet    DULoxetine (CYMBALTA) 60 MG capsule    DULoxetine (CYMBALTA) 30 MG capsule    3. Agoraphobia  F40.00     4. Insomnia due to medical condition  G47.01    mood, OSA, cough    5. Tobacco use disorder  F17.200     6. 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  Past trials of Paxil, Luvox, Klonopin, Zoloft, Seroquel, Wellbutrin, Ambien  Past Medical History:  Past Medical History:  Diagnosis Date   Allergic rhinitis    Anemia    Anxiety    Anxiety and depression    Carpal tunnel syndrome    COVID-19 01/15/2020   Depression    Fibroids    GERD (gastroesophageal reflux disease)    Headache(784.0)    Headache(784.0)    Heavy periods    Hyperthyroidism    right portion of thyroid removed   Muscle spasm    Neurological disorder    evaluation for ms  Painful menstrual periods    Shortness of breath    when i smoke a lot    Past Surgical History:  Procedure Laterality Date   BREAST BIOPSY Left    neg   carpel tunnel Left 2017   CESAREAN SECTION     times 2   CHOLECYSTECTOMY     THYROID LOBECTOMY      Family Psychiatric History: Reviewed family psychiatric history from progress note on 03/24/2018  Family History:  Family History  Problem Relation Age of Onset   Osteoporosis Mother    Breast cancer Mother 60   Diabetes Father    Anxiety disorder Father    Depression Father     Post-traumatic stress disorder Father    Heart disease Father    Lung cancer Father    Anxiety disorder Sister    Alcohol abuse Brother    Colon cancer Neg Hx    Ovarian cancer Neg Hx     Social History: Reviewed social history from progress note on 03/24/2018 Social History   Socioeconomic History   Marital status: Legally Separated    Spouse name: Not on file   Number of children: 2   Years of education: Not on file   Highest education level: High school graduate  Occupational History   Not on file  Tobacco Use   Smoking status: Every Day    Packs/day: 1.00    Years: 25.00    Pack years: 25.00    Types: Cigarettes    Start date: 05/22/1986   Smokeless tobacco: Never  Vaping Use   Vaping Use: Never used  Substance and Sexual Activity   Alcohol use: No    Alcohol/week: 0.0 standard drinks   Drug use: No   Sexual activity: Yes    Partners: Male    Birth control/protection: Surgical, Injection  Other Topics Concern   Not on file  Social History Narrative   Not on file   Social Determinants of Health   Financial Resource Strain: Medium Risk   Difficulty of Paying Living Expenses: Somewhat hard  Food Insecurity: Food Insecurity Present   Worried About Running Out of Food in the Last Year: Sometimes true   Ran Out of Food in the Last Year: Sometimes true  Transportation Needs: No Transportation Needs   Lack of Transportation (Medical): No   Lack of Transportation (Non-Medical): No  Physical Activity: Insufficiently Active   Days of Exercise per Week: 2 days   Minutes of Exercise per Session: 10 min  Stress: Stress Concern Present   Feeling of Stress : Very much  Social Connections: Socially Isolated   Frequency of Communication with Friends and Family: More than three times a week   Frequency of Social Gatherings with Friends and Family: More than three times a week   Attends Religious Services: Never   Marine scientist or Organizations: No   Attends English as a second language teacher Meetings: Never   Marital Status: Separated    Allergies:  Allergies  Allergen Reactions   Azithromycin Other (See Comments)    headache   Codeine Other (See Comments)    unknown   Penicillins Other (See Comments)    headaches    Metabolic Disorder Labs: Lab Results  Component Value Date   HGBA1C 5.2 03/30/2021   No results found for: PROLACTIN No results found for: CHOL, TRIG, HDL, CHOLHDL, VLDL, LDLCALC Lab Results  Component Value Date   TSH 1.438 09/06/2021    Therapeutic Level Labs: No results  found for: LITHIUM No results found for: VALPROATE No components found for:  CBMZ  Current Medications: Current Outpatient Medications  Medication Sig Dispense Refill   brexpiprazole (REXULTI) 0.25 MG TABS tablet Take 1 tablet (0.25 mg total) by mouth daily. 30 tablet 1   albuterol (VENTOLIN HFA) 108 (90 Base) MCG/ACT inhaler INHALE 1 TO 2 PUFFS BY MOUTH DAILY 6.7 each 5   BREO ELLIPTA 100-25 MCG/INH AEPB Inhale 1 puff into the lungs daily. 28 each 4   buPROPion ER (WELLBUTRIN SR) 100 MG 12 hr tablet Take 1 tablet (100 mg total) by mouth daily. 180 tablet 0   cyclobenzaprine (FLEXERIL) 10 MG tablet Take 10 mg by mouth 3 (three) times daily as needed for muscle spasms.     DULoxetine (CYMBALTA) 30 MG capsule Take 1 capsule (30 mg total) by mouth daily. Take along with 60 mg - total of 90 mg daily 90 capsule 0   DULoxetine (CYMBALTA) 60 MG capsule Take 1 capsule (60 mg total) by mouth daily. Take along with 30 mg daily 90 capsule 0   ergocalciferol (VITAMIN D2) 1.25 MG (50000 UT) capsule Take 1 capsule by mouth once a week. TAKES DIFFERENTLY - 1000 MG (Patient not taking: Reported on 10/05/2021)     etodolac (LODINE) 400 MG tablet Take by mouth. (Patient not taking: Reported on 10/05/2021)     furosemide (LASIX) 20 MG tablet TAKE 1 TABLET BY MOUTH TWICE A WEEK  5   gabapentin (NEURONTIN) 600 MG tablet Take 1 tablet (600 mg total) by mouth 2 (two) times daily.  (Patient taking differently: Take 900 mg by mouth as directed. 300 mg daily and 600 mg at bedtime) 180 tablet 1   meclizine (ANTIVERT) 12.5 MG tablet      medroxyPROGESTERone (DEPO-PROVERA) 150 MG/ML injection INJECT 1 ML (150 MG TOTAL) INTO THE MUSCLE EVERY 3 (THREE) MONTHS. 1 mL 3   meloxicam (MOBIC) 7.5 MG tablet TAKE 1 TABLET BY MOUTH EVERY DAY (Patient not taking: Reported on 10/05/2021) 30 tablet 3   omeprazole (PRILOSEC) 40 MG capsule TAKE 1 CAPSULE BY MOUTH EVERY DAY 90 capsule 3   phentermine 15 MG capsule TAKE 1 CAPSULE BY MOUTH (15MG  TOTAL) EVERY MORNING (Patient not taking: Reported on 10/05/2021) 30 capsule 0   propranolol (INDERAL) 10 MG tablet TAKE 1 TABLET BY MOUTH THREE TIMES A DAY AS NEEDED (Patient taking differently: Take 60 mg by mouth. DAILY) 270 tablet 1   rosuvastatin (CRESTOR) 20 MG tablet TAKE 1 TABLET BY MOUTH EVERY DAY 90 tablet 3   Suvorexant (BELSOMRA) 10 MG TABS Take 10 mg by mouth at bedtime. 30 tablet 1   topiramate (TOPAMAX) 100 MG tablet Take 1.5 tablets (150 mg total) by mouth daily. 150 MG AM AND 200 MG PM ,BEING PRESCRIBED PER NEUROLOGY 135 tablet 1   Ubrogepant 100 MG TABS Take by mouth.     vitamin B-12 (CYANOCOBALAMIN) 1000 MCG tablet Take 1,000 mcg by mouth daily. (Patient not taking: Reported on 10/05/2021)     No current facility-administered medications for this visit.     Musculoskeletal: Strength & Muscle Tone:  UTA Gait & Station:  UTA Patient leans: N/A  Psychiatric Specialty Exam: Review of Systems  Respiratory:  Positive for cough.   Psychiatric/Behavioral:  Positive for dysphoric mood and sleep disturbance. The patient is nervous/anxious.    There were no vitals taken for this visit.There is no height or weight on file to calculate BMI.  General Appearance: Casual  Eye Contact:  Fair  Speech:  Clear and Coherent  Volume:  Normal  Mood:  Anxious and Depressed  Affect:  Congruent  Thought Process:  Goal Directed and Descriptions of  Associations: Intact  Orientation:  Full (Time, Place, and Person)  Thought Content: Logical   Suicidal Thoughts:  No  Homicidal Thoughts:  No  Memory:  Immediate;   Fair Recent;   Fair Remote;   Fair  Judgement:  Fair  Insight:  Fair  Psychomotor Activity:  Normal  Concentration:  Concentration: Fair and Attention Span: Fair  Recall:  [employer] of Knowledge: Fair  Language: Fair  Akathisia:  No  Handed:  Right  AIMS (if indicated): done, 0  Assets:  Communication Skills Desire for Improvement Housing Social Support  ADL's:  Intact  Cognition: WNL  Sleep:   Restless due to cough   Screenings: AUDIT    Flowsheet Row Office Visit from 05/22/2016 in [place] [facility]  Alcohol Use Disorder Identification Test Final Score (AUDIT) 0      GAD-7    Flowsheet Row Counselor from 10/06/2020 in [facility] Office Visit from 08/10/2019 in [facility]  Total GAD-7 Score 15 14      Mini-Mental    Story from 09/27/2021 in [facility]  Total Score (max 30 points ) 29      PHQ2-9    [place] from 09/27/2021 in [place] Office Visit from 09/10/2021 in [facility] Video Visit from 09/05/2021 in [place] [facility] Video Visit from 07/09/2021 in [facility] Video Visit from 02/26/2021 in [facility]  PHQ-2 Total Score 3 6 6  0 2  PHQ-9 Total Score 15 20 24  -- 12      Flowsheet Row Video Visit from 02/26/2021 in [facility] Counselor from 02/22/2021 in [facility] No Risk No Risk        Assessment and Plan: Mary Koch is a 50 year old Caucasian female who has a history of MDD, panic attacks, tobacco use disorder, caffeine use disorder, insomnia was evaluated by telemedicine  today.  Patient with current cough which does have an impact on her sleep, continues to struggle with depressive symptoms as well as sleep problems.  Patient also did not tolerate the Abilify due to adverse side effects, discussed plan as noted below.  Plan MDD-unstable Cymbalta 90 mg p.o. daily-reduced dosage Wellbutrin SR 200 mg p.o. daily in the morning, reduced dosage with plan to taper it off. Discontinue Abilify for noncompliance and side effects. Start Rexulti 0.25 mg p.o. daily.  Plan to gradually increase the dosage. AIMS - 0  Panic disorder-improving Gabapentin 900 mg p.o. daily in divided dosage-prescribed by neurology Cymbalta 90 mg p.o. daily  Agoraphobia-stable Patient was advised to restart CBT as needed in the past.  Tobacco use disorder-improving Provided counseling for 5 minutes.  Caffeine use disorder-improving Provided counseling  Patient advised to reach out to her primary care provider for her current cough.  We will coordinate care.  Follow-up in clinic in 3 weeks or sooner as needed.  This note was generated in part or whole with voice recognition software. Voice recognition is usually quite accurate but there are transcription errors that can and very often do occur. I apologize for any typographical errors that were not detected and corrected.     Ursula Alert, MD 11/28/2021, 9:28 AM

## 2021-11-30 ENCOUNTER — Telehealth: Payer: Self-pay

## 2021-11-30 NOTE — Telephone Encounter (Signed)
received fax for a prior auth for the rexulti.

## 2021-11-30 NOTE — Telephone Encounter (Signed)
went online and submitted the prior auth - pending ?

## 2021-12-03 NOTE — Telephone Encounter (Signed)
received notice that prior Josem Kaufmann was approved from [id number] to 12-29-22

## 2021-12-19 ENCOUNTER — Telehealth (INDEPENDENT_AMBULATORY_CARE_PROVIDER_SITE_OTHER): Payer: Medicare HMO | Admitting: Psychiatry

## 2021-12-19 ENCOUNTER — Other Ambulatory Visit: Payer: Self-pay

## 2021-12-19 ENCOUNTER — Encounter: Payer: Self-pay | Admitting: Psychiatry

## 2021-12-19 DIAGNOSIS — F1590 Other stimulant use, unspecified, uncomplicated: Secondary | ICD-10-CM | POA: Diagnosis not present

## 2021-12-19 DIAGNOSIS — F4000 Agoraphobia, unspecified: Secondary | ICD-10-CM | POA: Diagnosis not present

## 2021-12-19 DIAGNOSIS — F17200 Nicotine dependence, unspecified, uncomplicated: Secondary | ICD-10-CM

## 2021-12-19 DIAGNOSIS — F3341 Major depressive disorder, recurrent, in partial remission: Secondary | ICD-10-CM | POA: Diagnosis not present

## 2021-12-19 DIAGNOSIS — F410 Panic disorder [episodic paroxysmal anxiety] without agoraphobia: Secondary | ICD-10-CM | POA: Diagnosis not present

## 2021-12-19 DIAGNOSIS — G4701 Insomnia due to medical condition: Secondary | ICD-10-CM | POA: Diagnosis not present

## 2021-12-19 NOTE — Progress Notes (Signed)
Virtual Visit via Video Note  I connected with Mary Koch on 12/19/21 at  4:00 PM EST by a video enabled telemedicine application and verified that I am speaking with the correct person using two identifiers.  Location Provider Location : [facility] Patient Location : Home  Participants: Patient , Provider   I discussed the limitations of evaluation and management by telemedicine and the availability of in person appointments. The patient expressed understanding and agreed to proceed.   I discussed the assessment and treatment plan with the patient. The patient was provided an opportunity to ask questions and all were answered. The patient agreed with the plan and demonstrated an understanding of the instructions.   The patient was advised to call back or seek an in-person evaluation if the symptoms worsen or if the condition fails to improve as anticipated.  Video connection was lost at less than 50% of the duration of the visit, at which time the remainder of the visit was completed through audio only   [facility] MD OP Progress Note  12/20/2021 9:27 AM [place]  MRN:  [id number]  Chief Complaint:  Chief Complaint   Follow-up; Depression; Anxiety    HPI: Mary Koch is a 50 year old Caucasian female who lives in [place], has a history of MDD, panic disorder, agoraphobia, insomnia, tobacco use disorder, caffeine use disorder was evaluated by telemedicine today.  Patient today reports the holidays does make her anxious and sad because of everything that is going on in her family.  She reports she misses her dad who passed away as well as she is anxious about being around her brother who is currently struggling with cancer.  She however feels her mood symptoms are more manageable now that she is on the [place].  She feels more motivated to do things.  She reports sleep is improved.  She currently takes gabapentin as well as Topamax at night and that helps her with her sleep.  Her cough  is better and that has also helped with her sleep.  She does not take the Belsomra since the Topamax and gabapentin combination helps her.  Patient continues to smoke cigarettes however reports she has been cutting back.  She denies any suicidality, homicidality or perceptual disturbances.  Patient denies any other concerns today.    Visit Diagnosis:    ICD-10-CM   1. MDD (major depressive disorder), recurrent, in partial remission ([place])  F33.41     2. Panic disorder  F41.0     3. Agoraphobia  F40.00     4. Insomnia due to medical condition  G47.01    mood, OSA    5. Tobacco use disorder  F17.200     6. Caffeine use disorder  F15.90       Past Psychiatric History: I have reviewed past psychiatric history from progress note on 03/24/2018.  Past trials of Paxil, Luvox, Klonopin, Zoloft, Seroquel, Wellbutrin, Ambien  Past Medical History:  Past Medical History:  Diagnosis Date   Allergic rhinitis    Anemia    Anxiety    Anxiety and depression    Carpal tunnel syndrome    COVID-19 01/15/2020   Depression    Fibroids    GERD (gastroesophageal reflux disease)    Headache(784.0)    Headache(784.0)    Heavy periods    Hyperthyroidism    right portion of thyroid removed   Muscle spasm    Neurological disorder    evaluation for ms   Painful menstrual periods  Shortness of breath    when i smoke a lot    Past Surgical History:  Procedure Laterality Date   BREAST BIOPSY Left    neg   carpel tunnel Left 2017   CESAREAN SECTION     times 2   CHOLECYSTECTOMY     THYROID LOBECTOMY      Family Psychiatric History: Reviewed family psychiatric history from progress note on 03/24/2018  Family History:  Family History  Problem Relation Age of Onset   Osteoporosis Mother    Breast cancer Mother 42   Diabetes Father    Anxiety disorder Father    Depression Father    Post-traumatic stress disorder Father    Heart disease Father    Lung cancer Father    Anxiety  disorder Sister    Alcohol abuse Brother    Colon cancer Neg Hx    Ovarian cancer Neg Hx     Social History: Reviewed social history from progress note on 03/24/2018 Social History   Socioeconomic History   Marital status: Legally Separated    Spouse name: Not on file   Number of children: 2   Years of education: Not on file   Highest education level: High school graduate  Occupational History   Not on file  Tobacco Use   Smoking status: Every Day    Packs/day: 1.00    Years: 25.00    Pack years: 25.00    Types: Cigarettes    Start date: 05/22/1986   Smokeless tobacco: Never  Vaping Use   Vaping Use: Never used  Substance and Sexual Activity   Alcohol use: No    Alcohol/week: 0.0 standard drinks   Drug use: No   Sexual activity: Yes    Partners: Male    Birth control/protection: Surgical, Injection  Other Topics Concern   Not on file  Social History Narrative   Not on file   Social Determinants of Health   Financial Resource Strain: Medium Risk   Difficulty of Paying Living Expenses: Somewhat hard  Food Insecurity: Food Insecurity Present   Worried About Running Out of Food in the Last Year: Sometimes true   Ran Out of Food in the Last Year: Sometimes true  Transportation Needs: No Transportation Needs   Lack of Transportation (Medical): No   Lack of Transportation (Non-Medical): No  Physical Activity: Insufficiently Active   Days of Exercise per Week: 2 days   Minutes of Exercise per Session: 10 min  Stress: Stress Concern Present   Feeling of Stress : Very much  Social Connections: Socially Isolated   Frequency of Communication with Friends and Family: More than three times a week   Frequency of Social Gatherings with Friends and Family: More than three times a week   Attends Religious Services: Never   Marine scientist or Organizations: No   Attends Archivist Meetings: Never   Marital Status: Separated    Allergies:  Allergies   Allergen Reactions   Azithromycin Other (See Comments)    headache   Codeine Other (See Comments)    unknown   Penicillins Other (See Comments)    headaches    Metabolic Disorder Labs: Lab Results  Component Value Date   HGBA1C 5.2 03/30/2021   No results found for: PROLACTIN No results found for: CHOL, TRIG, HDL, CHOLHDL, VLDL, LDLCALC Lab Results  Component Value Date   TSH 1.438 09/06/2021    Therapeutic Level Labs: No results found for: LITHIUM No results found  for: VALPROATE No components found for:  CBMZ  Current Medications: Current Outpatient Medications  Medication Sig Dispense Refill   albuterol (VENTOLIN HFA) 108 (90 Base) MCG/ACT inhaler INHALE 1 TO 2 PUFFS BY MOUTH DAILY 6.7 each 5   BREO ELLIPTA 100-25 MCG/INH AEPB Inhale 1 puff into the lungs daily. 28 each 4   brexpiprazole (REXULTI) 0.25 MG TABS tablet Take 1 tablet (0.25 mg total) by mouth daily. 30 tablet 1   buPROPion ER (WELLBUTRIN SR) 100 MG 12 hr tablet Take 1 tablet (100 mg total) by mouth daily. 180 tablet 0   cyclobenzaprine (FLEXERIL) 10 MG tablet Take 10 mg by mouth 3 (three) times daily as needed for muscle spasms.     DULoxetine (CYMBALTA) 30 MG capsule Take 1 capsule (30 mg total) by mouth daily. Take along with 60 mg - total of 90 mg daily 90 capsule 0   DULoxetine (CYMBALTA) 60 MG capsule Take 1 capsule (60 mg total) by mouth daily. Take along with 30 mg daily 90 capsule 0   ergocalciferol (VITAMIN D2) 1.25 MG (50000 UT) capsule Take 1 capsule by mouth once a week. TAKES DIFFERENTLY - 1000 MG (Patient not taking: Reported on 10/05/2021)     etodolac (LODINE) 400 MG tablet Take by mouth. (Patient not taking: Reported on 10/05/2021)     furosemide (LASIX) 20 MG tablet TAKE 1 TABLET BY MOUTH TWICE A WEEK  5   gabapentin (NEURONTIN) 600 MG tablet Take 1 tablet (600 mg total) by mouth 2 (two) times daily. (Patient taking differently: Take 900 mg by mouth as directed. 300 mg daily and 600 mg at  bedtime) 180 tablet 1   meclizine (ANTIVERT) 12.5 MG tablet      medroxyPROGESTERone (DEPO-PROVERA) 150 MG/ML injection INJECT 1 ML (150 MG TOTAL) INTO THE MUSCLE EVERY 3 (THREE) MONTHS. 1 mL 3   meloxicam (MOBIC) 7.5 MG tablet TAKE 1 TABLET BY MOUTH EVERY DAY (Patient not taking: Reported on 10/05/2021) 30 tablet 3   omeprazole (PRILOSEC) 40 MG capsule TAKE 1 CAPSULE BY MOUTH EVERY DAY 90 capsule 3   phentermine 15 MG capsule TAKE 1 CAPSULE BY MOUTH (15MG  TOTAL) EVERY MORNING (Patient not taking: Reported on 10/05/2021) 30 capsule 0   propranolol (INDERAL) 10 MG tablet TAKE 1 TABLET BY MOUTH THREE TIMES A DAY AS NEEDED (Patient taking differently: Take 60 mg by mouth. DAILY) 270 tablet 1   rosuvastatin (CRESTOR) 20 MG tablet TAKE 1 TABLET BY MOUTH EVERY DAY 90 tablet 3   Suvorexant (BELSOMRA) 10 MG TABS Take 10 mg by mouth at bedtime. (Patient not taking: Reported on 12/19/2021) 30 tablet 1   topiramate (TOPAMAX) 100 MG tablet Take 1.5 tablets (150 mg total) by mouth daily. 150 MG AM AND 200 MG PM ,BEING PRESCRIBED PER NEUROLOGY 135 tablet 1   Ubrogepant 100 MG TABS Take by mouth.     vitamin B-12 (CYANOCOBALAMIN) 1000 MCG tablet Take 1,000 mcg by mouth daily. (Patient not taking: Reported on 10/05/2021)     No current facility-administered medications for this visit.     Musculoskeletal: Strength & Muscle Tone:  UTA Gait & Station:  Seated Patient leans: N/A  Psychiatric Specialty Exam: Review of Systems  Respiratory:  Positive for cough.   Psychiatric/Behavioral:  Positive for dysphoric mood. The patient is nervous/anxious.   All other systems reviewed and are negative.  There were no vitals taken for this visit.There is no height or weight on file to calculate BMI.  General Appearance: Casual  Eye Contact:  Fair  Speech:  Clear and Coherent  Volume:  Normal  Mood:  Anxious and Depressed improving  Affect:  Congruent  Thought Process:  Goal Directed and Descriptions of Associations:  Intact  Orientation:  Full (Time, Place, and Person)  Thought Content: Logical   Suicidal Thoughts:  No  Homicidal Thoughts:  No  Memory:  Immediate;   Fair Recent;   Fair Remote;   Fair  Judgement:  Fair  Insight:  Fair  Psychomotor Activity:  Normal  Concentration:  Concentration: Fair and Attention Span: Fair  Recall:  [employer] of Knowledge: Fair  Language: Fair  Akathisia:  No  Handed:  Right  AIMS (if indicated): done ,0  Assets:  Communication Skills Desire for Improvement Housing Social Support Transportation  ADL's:  Intact  Cognition: WNL  Sleep:  Fair   Screenings: AUDIT    Flowsheet Row Office Visit from 05/22/2016 in [facility]  Alcohol Use Disorder Identification Test Final Score (AUDIT) 0      GAD-7    Flowsheet Row Counselor from 10/06/2020 in [facility] Office Visit from 08/10/2019 in [facility]  Total GAD-7 Score 15 [place] from 09/27/2021 in [facility]  Total Score (max 30 points ) 29      PHQ2-9    Flowsheet Row Video Visit from 12/19/2021 in [facility] from 09/27/2021 in [place] Office Visit from 09/10/2021 in [place] Video Visit from 09/05/2021 in [place] Video Visit from 07/09/2021 in [place]  PHQ-2 Total Score 2 3 6 6  0  PHQ-9 Total Score 10 15 20 24  --      Flowsheet Row Video Visit from 12/19/2021 in [place] Video Visit from 02/26/2021 in [place] Counselor from 02/22/2021 in [place] No Risk No Risk No Risk        Assessment and Plan: Makenzee Choudhry is a 50 year old Caucasian female who has a history of MDD, panic attacks, tobacco use disorder,  caffeine use disorder, insomnia was evaluated by telemedicine today.  Patient is currently improving on the [place]ulti, however will continue to need psychotherapy sessions.  Patient was advised to restart CBT last visit.  Discussed plan as noted below.  Plan  MDD-improving Cymbalta 90 mg p.o. daily-reduced dosage Wellbutrin SR 200 mg p.o. daily in the morning, reduced dosage with plan to taper it off. Continue Rexulti 0.25 mg p.o. daily. Patient was advised to restart CBT-pending  Panic disorder-improving Gabapentin 900 mg p.o. daily in divided dosage-prescribed by neurology Cymbalta 90 mg p.o. daily  Agoraphobia-stable Patient advised to restart CBT in the past.  Tobacco use disorder-improving Provided counseling for 2 minutes  Caffeine use disorder-improving Provided counseling.  Follow-up in clinic in 4 to 5 weeks or sooner if needed.   I have spent at least 19 minutes non face to face with patient today.  This note was generated in part or whole with voice recognition software. Voice recognition is usually quite accurate but there are transcription errors that can and very often do occur. I apologize for any typographical errors that were not detected and corrected.      Ursula Alert, MD 12/20/2021, 9:27 AM

## 2022-01-04 ENCOUNTER — Ambulatory Visit (INDEPENDENT_AMBULATORY_CARE_PROVIDER_SITE_OTHER): Payer: Medicare HMO | Admitting: Obstetrics and Gynecology

## 2022-01-04 ENCOUNTER — Other Ambulatory Visit: Payer: Self-pay

## 2022-01-04 DIAGNOSIS — Z3042 Encounter for surveillance of injectable contraceptive: Secondary | ICD-10-CM | POA: Diagnosis not present

## 2022-01-04 MED ORDER — MEDROXYPROGESTERONE ACETATE 150 MG/ML IM SUSP
150.0000 mg | Freq: Once | INTRAMUSCULAR | Status: AC
  Administered 2022-01-04: 150 mg via INTRAMUSCULAR

## 2022-01-04 NOTE — Progress Notes (Signed)
Pt presents for routine depo injection, no adverse reactions, all questions answered.  Last Depo-Provera: 10/05/2021. Side Effects if any: NO. Serum HCG indicated? NA. Depo-Provera 150 mg IM given by: Peggye Pitt, CMA. Next appointment due March 24 - April 05 2022.

## 2022-01-07 ENCOUNTER — Encounter: Payer: Self-pay | Admitting: Obstetrics and Gynecology

## 2022-01-24 ENCOUNTER — Encounter: Payer: Self-pay | Admitting: Psychiatry

## 2022-01-24 ENCOUNTER — Telehealth (INDEPENDENT_AMBULATORY_CARE_PROVIDER_SITE_OTHER): Payer: Medicare HMO | Admitting: Psychiatry

## 2022-01-24 ENCOUNTER — Other Ambulatory Visit: Payer: Self-pay

## 2022-01-24 DIAGNOSIS — G4701 Insomnia due to medical condition: Secondary | ICD-10-CM

## 2022-01-24 DIAGNOSIS — F1590 Other stimulant use, unspecified, uncomplicated: Secondary | ICD-10-CM | POA: Diagnosis not present

## 2022-01-24 DIAGNOSIS — F3342 Major depressive disorder, recurrent, in full remission: Secondary | ICD-10-CM

## 2022-01-24 DIAGNOSIS — F3341 Major depressive disorder, recurrent, in partial remission: Secondary | ICD-10-CM

## 2022-01-24 DIAGNOSIS — F410 Panic disorder [episodic paroxysmal anxiety] without agoraphobia: Secondary | ICD-10-CM | POA: Diagnosis not present

## 2022-01-24 DIAGNOSIS — F17200 Nicotine dependence, unspecified, uncomplicated: Secondary | ICD-10-CM

## 2022-01-24 DIAGNOSIS — F4000 Agoraphobia, unspecified: Secondary | ICD-10-CM

## 2022-01-24 NOTE — Progress Notes (Signed)
Virtual Visit via Video Note  I connected with Mary Koch on 01/24/22 at 11:20 AM EST by a video enabled telemedicine application and verified that I am speaking with the correct person using two identifiers.  Location Provider Location : [facility] Patient Location : Home  Participants: Patient , Provider    I discussed the limitations of evaluation and management by telemedicine and the availability of in person appointments. The patient expressed understanding and agreed to proceed.   I discussed the assessment and treatment plan with the patient. The patient was provided an opportunity to ask questions and all were answered. The patient agreed with the plan and demonstrated an understanding of the instructions.   The patient was advised to call back or seek an in-person evaluation if the symptoms worsen or if the condition fails to improve as anticipated.    [facility] MD OP Progress Note  01/24/2022 12:34 PM Mary Koch  MRN:  [id number]  Chief Complaint:  Chief Complaint   Follow-up; 51 year old Caucasian female, has a history of MDD, panic disorder, agoraphobia, insomnia, presented for follow-up medication management.    HPI: Mary Koch is a 51 year old Caucasian female who lives in [place], has a history of MDD, panic disorder, agoraphobia, insomnia, tobacco use disorder, caffeine use disorder was evaluated by telemedicine today.  Patient today grieving the loss of her dad who passed away almost a year ago.  It is the death anniversary coming up on February 4.  Patient reports she is planning to do a celebration for her dad with her family.  It has been hard to cope since its around this time of the year when he passed.  Patient continues to be interested in psychotherapy and agrees to schedule an appointment with therapist.  Patient is compliant on her medications.  Other than her grief denies any significant depressive symptoms.  Reports sleep is overall okay.  Denies  suicidality, homicidality or perceptual disturbances.  Patient denies any other concerns today.  Visit Diagnosis:    ICD-10-CM   1. Recurrent major depressive disorder, in full remission ([place])  F33.42     2. Panic disorder  F41.0     3. Agoraphobia  F40.00     4. Insomnia due to medical condition  G47.01    mood, OSA    5. Tobacco use disorder  F17.200     6. 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  Past trials of Paxil, Luvox, Klonopin, Zoloft, Seroquel, Wellbutrin, Ambien.  Past Medical History:  Past Medical History:  Diagnosis Date   Allergic rhinitis    Anemia    Anxiety    Anxiety and depression    Carpal tunnel syndrome    COVID-19 01/15/2020   Depression    Fibroids    GERD (gastroesophageal reflux disease)    Headache(784.0)    Headache(784.0)    Heavy periods    Hyperthyroidism    right portion of thyroid removed   Muscle spasm    Neurological disorder    evaluation for ms   Painful menstrual periods    Shortness of breath    when i smoke a lot    Past Surgical History:  Procedure Laterality Date   BREAST BIOPSY Left    neg   carpel tunnel Left 2017   CESAREAN SECTION     times 2   CHOLECYSTECTOMY     THYROID LOBECTOMY  Family Psychiatric History: Reviewed family psychiatric history from progress note on 03/24/2018.  Family History:  Family History  Problem Relation Age of Onset   Osteoporosis Mother    Breast cancer Mother 53   Diabetes Father    Anxiety disorder Father    Depression Father    Post-traumatic stress disorder Father    Heart disease Father    Lung cancer Father    Anxiety disorder Sister    Alcohol abuse Brother    Colon cancer Neg Hx    Ovarian cancer Neg Hx     Social History: Reviewed social history from progress note on 03/24/2018. Social History   Socioeconomic History   Marital status: Legally Separated    Spouse  name: Not on file   Number of children: 2   Years of education: Not on file   Highest education level: High school graduate  Occupational History   Not on file  Tobacco Use   Smoking status: Every Day    Packs/day: 1.00    Years: 25.00    Pack years: 25.00    Types: Cigarettes    Start date: 05/22/1986   Smokeless tobacco: Never  Vaping Use   Vaping Use: Never used  Substance and Sexual Activity   Alcohol use: No    Alcohol/week: 0.0 standard drinks   Drug use: No   Sexual activity: Yes    Partners: Male    Birth control/protection: Surgical, Injection  Other Topics Concern   Not on file  Social History Narrative   Not on file   Social Determinants of Health   Financial Resource Strain: Medium Risk   Difficulty of Paying Living Expenses: Somewhat hard  Food Insecurity: Food Insecurity Present   Worried About [place] in the Last Year: Sometimes true   Ran Out of Food in the Last Year: Sometimes true  Transportation Needs: No Transportation Needs   Lack of Transportation (Medical): No   Lack of Transportation (Non-Medical): No  Physical Activity: Insufficiently Active   Days of Exercise per Week: 2 days   Minutes of Exercise per Session: 10 min  Stress: Stress Concern Present   Feeling of Stress : Very much  Social Connections: Socially Isolated   Frequency of Communication with Friends and Family: More than three times a week   Frequency of Social Gatherings with Friends and Family: More than three times a week   Attends Religious Services: Never   Marine scientist or Organizations: No   Attends Archivist Meetings: Never   Marital Status: Separated    Allergies:  Allergies  Allergen Reactions   Azithromycin Other (See Comments)    headache   Codeine Other (See Comments)    unknown   Penicillins Other (See Comments)    headaches    Metabolic Disorder Labs: Lab Results  Component Value Date   HGBA1C 5.2 03/30/2021   No  results found for: PROLACTIN No results found for: CHOL, TRIG, HDL, CHOLHDL, VLDL, LDLCALC Lab Results  Component Value Date   TSH 1.438 09/06/2021    Therapeutic Level Labs: No results found for: LITHIUM No results found for: VALPROATE No components found for:  CBMZ  Current Medications: Current Outpatient Medications  Medication Sig Dispense Refill   albuterol (VENTOLIN HFA) 108 (90 Base) MCG/ACT inhaler INHALE 1 TO 2 PUFFS BY MOUTH DAILY 6.7 each 5   BREO ELLIPTA 100-25 MCG/INH AEPB Inhale 1 puff into the lungs daily. 28 each 4   brexpiprazole (  REXULTI) 0.25 MG TABS tablet Take 1 tablet (0.25 mg total) by mouth daily. 30 tablet 1   buPROPion ER (WELLBUTRIN SR) 100 MG 12 hr tablet Take 1 tablet (100 mg total) by mouth daily. 180 tablet 0   cyclobenzaprine (FLEXERIL) 10 MG tablet Take 10 mg by mouth 3 (three) times daily as needed for muscle spasms.     DULoxetine (CYMBALTA) 30 MG capsule Take 1 capsule (30 mg total) by mouth daily. Take along with 60 mg - total of 90 mg daily 90 capsule 0   DULoxetine (CYMBALTA) 60 MG capsule Take 1 capsule (60 mg total) by mouth daily. Take along with 30 mg daily 90 capsule 0   ergocalciferol (VITAMIN D2) 1.25 MG (50000 UT) capsule Take 1 capsule by mouth once a week. TAKES DIFFERENTLY - 1000 MG (Patient not taking: Reported on 10/05/2021)     etodolac (LODINE) 400 MG tablet Take by mouth. (Patient not taking: Reported on 10/05/2021)     furosemide (LASIX) 20 MG tablet TAKE 1 TABLET BY MOUTH TWICE A WEEK  5   gabapentin (NEURONTIN) 600 MG tablet Take 1 tablet (600 mg total) by mouth 2 (two) times daily. (Patient taking differently: Take 900 mg by mouth as directed. 300 mg daily and 600 mg at bedtime) 180 tablet 1   meclizine (ANTIVERT) 12.5 MG tablet      medroxyPROGESTERone (DEPO-PROVERA) 150 MG/ML injection INJECT 1 ML (150 MG TOTAL) INTO THE MUSCLE EVERY 3 (THREE) MONTHS. 1 mL 3   meloxicam (MOBIC) 7.5 MG tablet TAKE 1 TABLET BY MOUTH EVERY DAY  (Patient not taking: Reported on 10/05/2021) 30 tablet 3   omeprazole (PRILOSEC) 40 MG capsule TAKE 1 CAPSULE BY MOUTH EVERY DAY 90 capsule 3   phentermine 15 MG capsule TAKE 1 CAPSULE BY MOUTH (15MG  TOTAL) EVERY MORNING (Patient not taking: Reported on 10/05/2021) 30 capsule 0   propranolol (INDERAL) 10 MG tablet TAKE 1 TABLET BY MOUTH THREE TIMES A DAY AS NEEDED (Patient taking differently: Take 60 mg by mouth. DAILY) 270 tablet 1   propranolol ER (INDERAL LA) 60 MG 24 hr capsule Take 60 mg by mouth daily.     rosuvastatin (CRESTOR) 20 MG tablet TAKE 1 TABLET BY MOUTH EVERY DAY 90 tablet 3   Suvorexant (BELSOMRA) 10 MG TABS Take 10 mg by mouth at bedtime. (Patient not taking: Reported on 12/19/2021) 30 tablet 1   topiramate (TOPAMAX) 100 MG tablet Take 1.5 tablets (150 mg total) by mouth daily. 150 MG AM AND 200 MG PM ,BEING PRESCRIBED PER NEUROLOGY 135 tablet 1   Ubrogepant 100 MG TABS Take by mouth.     vitamin B-12 (CYANOCOBALAMIN) 1000 MCG tablet Take 1,000 mcg by mouth daily. (Patient not taking: Reported on 10/05/2021)     No current facility-administered medications for this visit.     Musculoskeletal: Strength & Muscle Tone:  UTA Gait & Station:  Seated Patient leans: N/A  Psychiatric Specialty Exam: Review of Systems  Psychiatric/Behavioral:         Grieving  All other systems reviewed and are negative.  There were no vitals taken for this visit.There is no height or weight on file to calculate BMI.  General Appearance: Casual  Eye Contact:  Fair  Speech:  Clear and Coherent  Volume:  Normal  Mood:   Grieving  Affect:  Congruent  Thought Process:  Goal Directed and Descriptions of Associations: Intact  Orientation:  Full (Time, Place, and Person)  Thought Content: Logical  Suicidal Thoughts:  No  Homicidal Thoughts:  No  Memory:  Immediate;   Fair Recent;   Fair Remote;   Fair  Judgement:  Fair  Insight:  Fair  Psychomotor Activity:  Normal  Concentration:   Concentration: Fair and Attention Span: Fair  Recall:  [employer] of Knowledge: Fair  Language: Fair  Akathisia:  No  Handed:  Right  AIMS (if indicated): not done  Assets:  Communication Skills Desire for Improvement Housing Social Support  ADL's:  Intact  Cognition: WNL  Sleep:  Fair   Screenings: AUDIT    Flowsheet Row Office Visit from 05/22/2016 in [facility]  Alcohol Use Disorder Identification Test Final Score (AUDIT) 0      GAD-7    Flowsheet Row Counselor from 10/06/2020 in [facility] Office Visit from 08/10/2019 in [facility]  Total GAD-7 Score 15 [place] from 09/27/2021 in [facility]  Total Score (max 30 points ) 29      PHQ2-9    Flowsheet Row Video Visit from 01/24/2022 in [facility] Video Visit from 12/19/2021 in [facility] from 09/27/2021 in [facility] Office Visit from 09/10/2021 in [place] Video Visit from 09/05/2021 in [facility]  PHQ-2 Total Score 1 2 3 6 6   PHQ-9 Total Score -- 10 15 20 24       Flowsheet Row Video Visit from 12/19/2021 in [facility] Video Visit from 02/26/2021 in [facility] Counselor from 02/22/2021 in [place] No Risk No Risk No Risk        Assessment and Plan: Demitria Hay is a 51 year old Caucasian female who has a history of MDD, panic attacks, tobacco use disorder, caffeine use disorder, insomnia was evaluated by telemedicine today.  Patient is currently grieving the loss of her dad who passed away almost a year ago it being the death anniversary coming up.  Patient otherwise doing fairly well on the medication.  Plan as noted below.  Plan MDD-in  remission Cymbalta 90 mg p.o. daily-reduced dosage Wellbutrin SR 200 mg p.o. daily in the morning, reduced dosage.  Plan to taper it off. Rexulti 0.25 mg p.o. daily Patient motivated to start CBT.  Panic disorder-improving Gabapentin 900 mg p.o. daily in divided dosage prescribed by neurology Cymbalta 90 mg p.o. daily  Agoraphobia-stable Patient to restart CBT  Tobacco use disorder-improving Provided counseling for 5 minutes.  Caffeine use disorder-improving We will monitor closely  Patient with grief reaction, will benefit from counseling.  Patient advised to schedule an appointment with our therapist here.  Follow-up in clinic in 2 months or sooner if needed.  This note was generated in part or whole with voice recognition software. Voice recognition is usually quite accurate but there are transcription errors that can and very often do occur. I apologize for any typographical errors that were not detected and corrected.      Ursula Alert, MD 01/25/2022, 8:19 AM

## 2022-02-12 ENCOUNTER — Other Ambulatory Visit: Payer: Self-pay | Admitting: *Deleted

## 2022-02-12 MED ORDER — MELOXICAM 7.5 MG PO TABS: 7.5000 mg | ORAL_TABLET | Freq: Every day | ORAL | 3 refills | Status: DC

## 2022-02-25 ENCOUNTER — Other Ambulatory Visit: Payer: Self-pay | Admitting: *Deleted

## 2022-02-25 MED ORDER — FLUTICASONE FUROATE-VILANTEROL 100-25 MCG/ACT IN AEPB: 1.0000 | INHALATION_SPRAY | Freq: Every day | RESPIRATORY_TRACT | 4 refills | Status: DC

## 2022-02-26 ENCOUNTER — Other Ambulatory Visit: Payer: Self-pay

## 2022-02-26 ENCOUNTER — Encounter: Payer: Self-pay | Admitting: Internal Medicine

## 2022-02-26 ENCOUNTER — Ambulatory Visit (INDEPENDENT_AMBULATORY_CARE_PROVIDER_SITE_OTHER): Payer: Medicare HMO | Admitting: Internal Medicine

## 2022-02-26 VITALS — BP 143/93 | HR 94 | Ht 66.5 in | Wt 255.9 lb

## 2022-02-26 DIAGNOSIS — Z6841 Body Mass Index (BMI) 40.0 and over, adult: Secondary | ICD-10-CM | POA: Diagnosis not present

## 2022-02-26 DIAGNOSIS — K2100 Gastro-esophageal reflux disease with esophagitis, without bleeding: Secondary | ICD-10-CM

## 2022-02-26 DIAGNOSIS — Z131 Encounter for screening for diabetes mellitus: Secondary | ICD-10-CM | POA: Diagnosis not present

## 2022-02-26 DIAGNOSIS — F419 Anxiety disorder, unspecified: Secondary | ICD-10-CM

## 2022-02-26 DIAGNOSIS — I10 Essential (primary) hypertension: Secondary | ICD-10-CM | POA: Diagnosis not present

## 2022-02-26 DIAGNOSIS — F17200 Nicotine dependence, unspecified, uncomplicated: Secondary | ICD-10-CM

## 2022-02-26 DIAGNOSIS — G44219 Episodic tension-type headache, not intractable: Secondary | ICD-10-CM | POA: Diagnosis not present

## 2022-02-26 MED ORDER — ALBUTEROL SULFATE (2.5 MG/3ML) 0.083% IN NEBU: 2.5000 mg | INHALATION_SOLUTION | Freq: Four times a day (QID) | RESPIRATORY_TRACT | 1 refills | Status: DC | PRN

## 2022-02-26 MED ORDER — SEMAGLUTIDE (1 MG/DOSE) 4 MG/3ML ~~LOC~~ SOPN: 1.0000 mg | PEN_INJECTOR | SUBCUTANEOUS | 3 refills | Status: DC

## 2022-02-26 NOTE — Progress Notes (Signed)
Established Patient Office Visit  Subjective:  Patient ID: Mary Koch, female    DOB: [birth year]  Age: 51 y.o. MRN: [id number]  CC:  Chief Complaint  Patient presents with   Weight Gain    Patient would like to start taking the ozempic for weight loss.      HPI  Mary Koch presents for check up   Past Medical History:  Diagnosis Date   Allergic rhinitis    Anemia    Anxiety    Anxiety and depression    Carpal tunnel syndrome    COVID-19 01/15/2020   Depression    Fibroids    GERD (gastroesophageal reflux disease)    Headache(784.0)    Headache(784.0)    Heavy periods    Hyperthyroidism    right portion of thyroid removed   Muscle spasm    Neurological disorder    evaluation for ms   Painful menstrual periods    Shortness of breath    when i smoke a lot    Past Surgical History:  Procedure Laterality Date   BREAST BIOPSY Left    neg   carpel tunnel Left 2017   CESAREAN SECTION     times 2   CHOLECYSTECTOMY     THYROID LOBECTOMY      Family History  Problem Relation Age of Onset   Osteoporosis Mother    Breast cancer Mother 8   Diabetes Father    Anxiety disorder Father    Depression Father    Post-traumatic stress disorder Father    Heart disease Father    Lung cancer Father    Anxiety disorder Sister    Alcohol abuse Brother    Colon cancer Neg Hx    Ovarian cancer Neg Hx     Social History   Socioeconomic History   Marital status: Legally Separated    Spouse name: Not on file   Number of children: 2   Years of education: Not on file   Highest education level: High school graduate  Occupational History   Not on file  Tobacco Use   Smoking status: Every Day    Packs/day: 1.00    Years: 25.00    Pack years: 25.00    Types: Cigarettes    Start date: 05/22/1986   Smokeless tobacco: Never  Vaping Use   Vaping Use: Never used  Substance and Sexual Activity   Alcohol use: No    Alcohol/week: 0.0 standard drinks   Drug use:  No   Sexual activity: Yes    Partners: Male    Birth control/protection: Surgical, Injection  Other Topics Concern   Not on file  Social History Narrative   Not on file   Social Determinants of Health   Financial Resource Strain: Medium Risk   Difficulty of Paying Living Expenses: Somewhat hard  Food Insecurity: Food Insecurity Present   Worried About Running Out of Food in the Last Year: Sometimes true   Ran Out of Food in the Last Year: Sometimes true  Transportation Needs: No Transportation Needs   Lack of Transportation (Medical): No   Lack of Transportation (Non-Medical): No  Physical Activity: Insufficiently Active   Days of Exercise per Week: 2 days   Minutes of Exercise per Session: 10 min  Stress: Stress Concern Present   Feeling of Stress : Very much  Social Connections: Socially Isolated   Frequency of Communication with Friends and Family: More than three times a week   Frequency of  Social Gatherings with Friends and Family: More than three times a week   Attends Religious Services: Never   Marine scientist or Organizations: No   Attends Music therapist: Never   Marital Status: Separated  Intimate Partner Violence: Not At Risk   Fear of Current or Ex-Partner: No   Emotionally Abused: No   Physically Abused: No   Sexually Abused: No     Current Outpatient Medications:    albuterol (PROVENTIL) (2.5 MG/3ML) 0.083% nebulizer solution, Take 3 mLs (2.5 mg total) by nebulization every 6 (six) hours as needed for wheezing or shortness of breath., Disp: 150 mL, Rfl: 1   albuterol (VENTOLIN HFA) 108 (90 Base) MCG/ACT inhaler, INHALE 1 TO 2 PUFFS BY MOUTH DAILY, Disp: 6.7 each, Rfl: 5   brexpiprazole (REXULTI) 0.25 MG TABS tablet, Take 1 tablet (0.25 mg total) by mouth daily., Disp: 30 tablet, Rfl: 1   buPROPion ER (WELLBUTRIN SR) 100 MG 12 hr tablet, Take 1 tablet (100 mg total) by mouth daily., Disp: 180 tablet, Rfl: 0   cyclobenzaprine (FLEXERIL) 10  MG tablet, Take 10 mg by mouth 3 (three) times daily as needed for muscle spasms., Disp: , Rfl:    DULoxetine (CYMBALTA) 30 MG capsule, Take 1 capsule (30 mg total) by mouth daily. Take along with 60 mg - total of 90 mg daily, Disp: 90 capsule, Rfl: 0   DULoxetine (CYMBALTA) 60 MG capsule, Take 1 capsule (60 mg total) by mouth daily. Take along with 30 mg daily, Disp: 90 capsule, Rfl: 0   ergocalciferol (VITAMIN D2) 1.25 MG (50000 UT) capsule, Take 1 capsule by mouth once a week. TAKES DIFFERENTLY - 1000 MG, Disp: , Rfl:    fluticasone furoate-vilanterol (BREO ELLIPTA) 100-25 MCG/ACT AEPB, Inhale 1 puff into the lungs daily., Disp: 30 each, Rfl: 4   furosemide (LASIX) 20 MG tablet, TAKE 1 TABLET BY MOUTH TWICE A WEEK, Disp: , Rfl: 5   gabapentin (NEURONTIN) 600 MG tablet, Take 1 tablet (600 mg total) by mouth 2 (two) times daily. (Patient taking differently: Take 900 mg by mouth as directed. 300 mg daily and 600 mg at bedtime), Disp: 180 tablet, Rfl: 1   meclizine (ANTIVERT) 12.5 MG tablet, , Disp: , Rfl:    medroxyPROGESTERone (DEPO-PROVERA) 150 MG/ML injection, INJECT 1 ML (150 MG TOTAL) INTO THE MUSCLE EVERY 3 (THREE) MONTHS., Disp: 1 mL, Rfl: 3   meloxicam (MOBIC) 7.5 MG tablet, Take 1 tablet (7.5 mg total) by mouth daily., Disp: 30 tablet, Rfl: 3   omeprazole (PRILOSEC) 40 MG capsule, TAKE 1 CAPSULE BY MOUTH EVERY DAY, Disp: 90 capsule, Rfl: 3   phentermine 15 MG capsule, TAKE 1 CAPSULE BY MOUTH (15MG  TOTAL) EVERY MORNING, Disp: 30 capsule, Rfl: 0   propranolol (INDERAL) 10 MG tablet, TAKE 1 TABLET BY MOUTH THREE TIMES A DAY AS NEEDED (Patient taking differently: Take 60 mg by mouth. DAILY), Disp: 270 tablet, Rfl: 1   propranolol ER (INDERAL LA) 60 MG 24 hr capsule, Take 60 mg by mouth daily., Disp: , Rfl:    rosuvastatin (CRESTOR) 20 MG tablet, TAKE 1 TABLET BY MOUTH EVERY DAY, Disp: 90 tablet, Rfl: 3   Suvorexant (BELSOMRA) 10 MG TABS, Take 10 mg by mouth at bedtime., Disp: 30 tablet, Rfl: 1    topiramate (TOPAMAX) 100 MG tablet, Take 1.5 tablets (150 mg total) by mouth daily. 150 MG AM AND 200 MG PM ,BEING PRESCRIBED PER NEUROLOGY, Disp: 135 tablet, Rfl: 1   Ubrogepant  100 MG TABS, Take by mouth., Disp: , Rfl:    vitamin B-12 (CYANOCOBALAMIN) 1000 MCG tablet, Take 1,000 mcg by mouth daily., Disp: , Rfl:    Allergies  Allergen Reactions   Azithromycin Other (See Comments)    headache   Codeine Other (See Comments)    unknown   Penicillins Other (See Comments)    headaches    ROS Review of Systems  Constitutional: Negative.   HENT: Negative.    Eyes: Negative.   Respiratory: Negative.    Cardiovascular: Negative.   Gastrointestinal: Negative.   Endocrine: Negative.   Genitourinary: Negative.   Musculoskeletal: Negative.   Skin: Negative.   Allergic/Immunologic: Negative.   Neurological: Negative.   Hematological: Negative.   Psychiatric/Behavioral: Negative.    All other systems reviewed and are negative.    Objective:    Physical Exam Vitals reviewed.  Constitutional:      Appearance: Normal appearance.  HENT:     Mouth/Throat:     Mouth: Mucous membranes are moist.  Eyes:     Pupils: Pupils are equal, round, and reactive to light.  Neck:     Vascular: No carotid bruit.  Cardiovascular:     Rate and Rhythm: Normal rate and regular rhythm.     Pulses: Normal pulses.     Heart sounds: Normal heart sounds.  Pulmonary:     Effort: Pulmonary effort is normal.     Breath sounds: Normal breath sounds.  Abdominal:     General: Bowel sounds are normal.     Palpations: Abdomen is soft. There is no hepatomegaly, splenomegaly or mass.     Tenderness: There is no abdominal tenderness.     Hernia: No hernia is present.  Musculoskeletal:        General: No tenderness.     Cervical back: Neck supple.     Right lower leg: No edema.     Left lower leg: No edema.  Skin:    Findings: No rash.  Neurological:     Mental Status: She is alert and oriented to  person, place, and time.     Motor: No weakness.  Psychiatric:        Mood and Affect: Mood and affect normal.        Behavior: Behavior normal.    BP (!) 143/93    Pulse 94    Ht 5' 6.5" (1.689 m)    Wt 255 lb 14.4 oz (116.1 kg)    BMI 40.68 kg/m  Wt Readings from Last 3 Encounters:  02/26/22 255 lb 14.4 oz (116.1 kg)  09/10/21 241 lb (109.3 kg)  04/27/21 253 lb 8 oz (115 kg)     Health Maintenance Due  Topic Date Due   COVID-19 Vaccine (1) Never done   FOOT EXAM  Never done   OPHTHALMOLOGY EXAM  Never done   URINE MICROALBUMIN  Never done   HIV Screening  Never done   Hepatitis C Screening  Never done   COLONOSCOPY (Pts 45-61yrs Insurance coverage will need to be confirmed)  Never done   PAP SMEAR-Modifier  10/30/2020   INFLUENZA VACCINE  07/30/2021   HEMOGLOBIN A1C  09/29/2021    There are no preventive care reminders to display for this patient.  Lab Results  Component Value Date   TSH 1.438 09/06/2021   Lab Results  Component Value Date   WBC 12.8 (H) 05/18/2017   HGB 13.9 05/18/2017   HCT 40.8 05/18/2017   MCV 83.2 05/18/2017  PLT 251 05/18/2017   Lab Results  Component Value Date   NA 138 05/18/2017   K 4.5 05/18/2017   CO2 23 05/18/2017   GLUCOSE 110 (H) 05/18/2017   BUN 15 05/18/2017   CREATININE 0.85 05/18/2017   BILITOT 0.7 05/18/2017   ALKPHOS 66 05/18/2017   AST 24 05/18/2017   ALT 18 05/18/2017   PROT 7.3 05/18/2017   ALBUMIN 3.7 05/18/2017   CALCIUM 9.2 05/18/2017   ANIONGAP 7 05/18/2017   No results found for: CHOL No results found for: HDL No results found for: LDLCALC No results found for: TRIG No results found for: CHOLHDL Lab Results  Component Value Date   HGBA1C 5.2 03/30/2021      Assessment & Plan:   Problem List Items Addressed This Visit       Cardiovascular and Mediastinum   Essential hypertension     Patient denies any chest pain or shortness of breath there is no history of palpitation or paroxysmal nocturnal  dyspnea   patient was advised to follow low-salt low-cholesterol diet    ideally I want to keep systolic blood pressure below 130 mmHg, patient was asked to check blood pressure one times a week and give me a report on that.  Patient will be follow-up in 3 months  or earlier as needed, patient will call me back for any change in the cardiovascular symptoms Patient was advised to buy a book from local bookstore concerning blood pressure and read several chapters  every day.  This will be supplemented by some of the material we will give him from the office.  Patient should also utilize other resources like YouTube and Internet to learn more about the blood pressure and the diet.        Digestive   Acid reflux    Counseling  If a person has gastroesophageal reflux disease (GERD), food and stomach acid move back up into the esophagus and cause symptoms or problems such as damage to the esophagus.  Anti-reflux measures include: raising the head of the bed, avoiding tight clothing or belts, avoiding eating late at night, not lying down shortly after mealtime, and achieving weight loss.  Avoid ASA, NSAID's, caffeine, alcohol, and tobacco.   OTC Pepcid and/or Tums are often very helpful for as needed use.   However, for persisting chronic or daily symptoms, stronger medications like Omeprazole may be needed.  You may need to avoid foods and drinks such as: ? Coffee and tea (with or without caffeine). ? Drinks that contain alcohol. ? Energy drinks and sports drinks. ? Bubbly (carbonated) drinks or sodas. ? Chocolate and cocoa. ? Peppermint and mint flavorings. ? Garlic and onions. ? Horseradish. ? Spicy and acidic foods. These include peppers, chili powder, curry powder, vinegar, hot sauces, and BBQ sauce. ? Citrus fruit juices and citrus fruits, such as oranges, lemons, and limes. ? Tomato-based foods. These include red sauce, chili, salsa, and pizza with red sauce. ? Fried and fatty  foods. These include donuts, french fries, potato chips, and high-fat dressings. ? High-fat meats. These include hot dogs, rib eye steak, sausage, ham, and bacon.         Nervous and Auditory   Episodic tension type headache    Headache is stable at the present time        Other   Anxiety   Class 3 severe obesity due to excess calories with serious comorbidity and body mass index (BMI) of 40.0 to 44.9 in adult (  [place])    - I encouraged the patient to lose weight.  - I educated them on making healthy dietary choices including eating more fruits and vegetables and less fried foods. - I encouraged the patient to exercise more, and educated on the benefits of exercise including weight loss, diabetes prevention, and hypertension prevention.   Dietary counseling with a registered dietician  Referral to a weight management support group (e.g. Weight Watchers, Overeaters Anonymous)  If your BMI is greater than 29 or you have gained more than 15 pounds you should work on weight loss.  Attend a healthy cooking class We will start Ozempic because patient does not tolerate the phentermine      Tobacco use disorder    - I instructed the patient to stop smoking and provided them with smoking cessation materials.  - I informed the patient that smoking puts them at increased risk for cancer, COPD, hypertension, and more.  - Informed the patient to seek help if they begin to have trouble breathing, develop chest pain, start to cough up blood, feel faint, or pass out.      Relevant Medications   albuterol (PROVENTIL) (2.5 MG/3ML) 0.083% nebulizer solution   Other Visit Diagnoses     Screening for diabetes mellitus    -  Primary   Relevant Orders   POCT glucose (manual entry)       Meds ordered this encounter  Medications   albuterol (PROVENTIL) (2.5 MG/3ML) 0.083% nebulizer solution    Sig: Take 3 mLs (2.5 mg total) by nebulization every 6 (six) hours as needed for wheezing or shortness  of breath.    Dispense:  150 mL    Refill:  1    Follow-up: No follow-ups on file.    Cletis Athens, MD

## 2022-02-26 NOTE — Assessment & Plan Note (Signed)
-   I instructed the patient to stop smoking and provided them with smoking cessation materials.  - I informed the patient that smoking puts them at increased risk for cancer, COPD, hypertension, and more.  - Informed the patient to seek help if they begin to have trouble breathing, develop chest pain, start to cough up blood, feel faint, or pass out.  

## 2022-02-26 NOTE — Assessment & Plan Note (Signed)
Headache is stable at the present time

## 2022-02-26 NOTE — Assessment & Plan Note (Signed)
-   I encouraged the patient to lose weight.  - I educated them on making healthy dietary choices including eating more fruits and vegetables and less fried foods. - I encouraged the patient to exercise more, and educated on the benefits of exercise including weight loss, diabetes prevention, and hypertension prevention.   Dietary counseling with a registered dietician  Referral to a weight management support group (e.g. Weight Watchers, Overeaters Anonymous)  If your BMI is greater than 29 or you have gained more than 15 pounds you should work on weight loss.  Attend a healthy cooking class We will start Ozempic because patient does not tolerate the phentermine

## 2022-02-26 NOTE — Assessment & Plan Note (Signed)

## 2022-02-26 NOTE — Assessment & Plan Note (Signed)

## 2022-02-27 ENCOUNTER — Ambulatory Visit: Payer: Medicare HMO | Admitting: Internal Medicine

## 2022-02-28 DIAGNOSIS — G4733 Obstructive sleep apnea (adult) (pediatric): Secondary | ICD-10-CM | POA: Diagnosis not present

## 2022-02-28 DIAGNOSIS — G43119 Migraine with aura, intractable, without status migrainosus: Secondary | ICD-10-CM | POA: Diagnosis not present

## 2022-02-28 DIAGNOSIS — H8110 Benign paroxysmal vertigo, unspecified ear: Secondary | ICD-10-CM | POA: Diagnosis not present

## 2022-02-28 DIAGNOSIS — G5603 Carpal tunnel syndrome, bilateral upper limbs: Secondary | ICD-10-CM | POA: Diagnosis not present

## 2022-02-28 DIAGNOSIS — E538 Deficiency of other specified B group vitamins: Secondary | ICD-10-CM | POA: Diagnosis not present

## 2022-02-28 DIAGNOSIS — R7309 Other abnormal glucose: Secondary | ICD-10-CM | POA: Diagnosis not present

## 2022-02-28 DIAGNOSIS — E569 Vitamin deficiency, unspecified: Secondary | ICD-10-CM | POA: Diagnosis not present

## 2022-02-28 DIAGNOSIS — R202 Paresthesia of skin: Secondary | ICD-10-CM | POA: Diagnosis not present

## 2022-03-03 ENCOUNTER — Other Ambulatory Visit: Payer: Self-pay | Admitting: Psychiatry

## 2022-03-03 DIAGNOSIS — F410 Panic disorder [episodic paroxysmal anxiety] without agoraphobia: Secondary | ICD-10-CM

## 2022-03-03 DIAGNOSIS — F331 Major depressive disorder, recurrent, moderate: Secondary | ICD-10-CM

## 2022-03-05 ENCOUNTER — Ambulatory Visit (INDEPENDENT_AMBULATORY_CARE_PROVIDER_SITE_OTHER): Payer: Medicare HMO | Admitting: Licensed Clinical Social Worker

## 2022-03-05 ENCOUNTER — Other Ambulatory Visit: Payer: Self-pay

## 2022-03-05 DIAGNOSIS — F411 Generalized anxiety disorder: Secondary | ICD-10-CM

## 2022-03-05 DIAGNOSIS — F331 Major depressive disorder, recurrent, moderate: Secondary | ICD-10-CM | POA: Diagnosis not present

## 2022-03-05 NOTE — Progress Notes (Signed)
Virtual Visit via Video Note  I connected with Mary Koch on 03/05/22 at  9:00 AM EST by a video enabled telemedicine application and verified that I am speaking with the correct person using two identifiers.  Location: Patient: home Provider: remote office [place], [place])   I discussed the limitations of evaluation and management by telemedicine and the availability of in person appointments. The patient expressed understanding and agreed to proceed.  I discussed the assessment and treatment plan with the patient. The patient was provided an opportunity to ask questions and all were answered. The patient agreed with the plan and demonstrated an understanding of the instructions.   The patient was advised to call back or seek an in-person evaluation if the symptoms worsen or if the condition fails to improve as anticipated.  I provided 40 minutes of non-face-to-face time during this encounter.   Rachel Bo Azim Gillingham, LCSW Comprehensive Clinical Assessment (CCA) Note  03/05/2022 Mary Koch [id number]  Chief Complaint:  Chief Complaint  Patient presents with   Establish Care   Visit Diagnosis:  MDD, recurrent, moderate Generalized Anxiety Disorder   Mary Koch is a 51 yo female reporting to [facility] for establishment of counseling services. Pt is currently being treated by Dr. Shea Evans for depression and anxiety. Pt lives with her two sons near her ex husband, who she co parents with. Pt denies SI, HI, or AVH. Pt reports one suicide attempt by overdose in the 1990's when she took too much tylenol. Pt did not tell anyone about it and was not treated at the time.  Pt reports that she has had AVH in the past, but not in several years. Pt denies any prior hospitalizations for SI or BH reasons. Pt reports that she is under the care of a neurologist and has high BP at times. Pt is currently on disability and SSI  CCA Screening, Triage and Referral (STR)  Patient Reported Information How did  you hear about [place]? Self  Referral name: Dr. Shea Evans  Referral phone number: No data recorded  Whom do you see for routine medical problems? Primary Care  Practice/Facility Name: No data recorded Practice/Facility Phone Number: No data recorded Name of Contact: No data recorded Contact Number: No data recorded Contact Fax Number: No data recorded Prescriber Name: No data recorded Prescriber Address (if known): No data recorded  What Is the Reason for Your Visit/Call Today? No data recorded How Long Has This Been Causing You Problems? > than 6 months  What Do You Feel Would Help You the Most Today? Treatment for Depression or other mood problem   Have You Recently Been in Any Inpatient Treatment (Hospital/Detox/Crisis Center/28-Day Program)? No  Name/Location of Program/Hospital:No data recorded How Long Were You There? No data recorded When Were You Discharged? No data recorded  Have You Ever Received Services From [facility] Before? Yes  Who Do You See at [facility]? Dr.Eappen   Have You Recently Had Any Thoughts About Hurting Yourself? No  Are You Planning to Commit Suicide/Harm Yourself At This time? No   Have you Recently Had Thoughts About Story? No  Explanation: No data recorded  Have You Used Any Alcohol or Drugs in the Past 24 Hours? No  How Long Ago Did You Use Drugs or Alcohol? No data recorded What Did You Use and How Much? No data recorded  Do You Currently Have a Therapist/Psychiatrist? Yes  Name of Therapist/Psychiatrist: Dr. Shea Evans   Have You Been Recently Discharged From  Any Mudlogger or Programs? No  Explanation of Discharge From Practice/Program: No data recorded    CCA Screening Triage Referral Assessment Type of Contact: Tele-Assessment  Is this Initial or Reassessment? Initial Assessment  Date Telepsych consult ordered in [facility]:  No data recorded Time Telepsych consult ordered in [facility]:  No data recorded  Patient  Reported Information Reviewed? No data recorded Patient Left Without Being Seen? No data recorded Reason for Not Completing Assessment: No data recorded  Collateral Involvement: N/A   Does Patient Have a Court Appointed Legal Guardian? No data recorded Name and Contact of Legal Guardian: No data recorded If Minor and Not Living with Parent(s), Who has Custody? No data recorded Is CPS involved or ever been involved? Never  Is APS involved or ever been involved? Never   Patient Determined To Be At Risk for Harm To Self or Others Based on Review of Patient Reported Information or Presenting Complaint? No  Method: No Plan  Availability of Means: No access or NA  Intent: Vague intent or NA  Notification Required: No need or identified person  Additional Information for Danger to Others Potential: -- (none)  Additional Comments for Danger to Others Potential: No data recorded Are There Guns or Other Weapons in Your Home? No data recorded Types of Guns/Weapons: No data recorded Are These Weapons Safely Secured?                            No data recorded Who Could Verify You Are Able To Have These Secured: No data recorded Do You Have any Outstanding Charges, Pending Court Dates, Parole/Probation? none  Contacted To Inform of Risk of Harm To Self or Others: Other: Comment (n/a)   Location of Assessment: No data recorded  Does Patient Present under Involuntary Commitment? No  IVC Papers Initial File Date: No data recorded  [place] of Residence: [place]   Patient Currently Receiving the Following Services: Medication Management   Determination of Need: Routine (7 days)   Options For Referral: Medication Management; Outpatient Therapy  CCA Biopsychosocial Intake/Chief Complaint:  Mary Koch is a 51 yo female reporting to [facility] for establishment of counseling services. Pt is currently being treated by Dr. Shea Evans for depression and anxiety. Pt lives with her two sons near her ex  husband, who she co parents with. Pt denies SI, HI, or AVH. Pt reports one suicide attempt by overdose in the 1990's when she took too much tylenol. Pt did not tell anyone about it and was not treated at the time.  Pt reports that she has had AVH in the past, but not in several years. Pt denies any prior hospitalizations for SI or BH reasons. Pt reports that she is under the care of a neurologist and has high BP at times. Pt is currently on disability and SSI.  Current Symptoms/Problems: depression and anxiety symptoms   Patient Reported Schizophrenia/Schizoaffective Diagnosis in Past: No data recorded  Strengths: pt has good self awareness  Preferences: outpatient psychiatric supports  Abilities: pt enjoys watching TV and social media (tik tok)   Type of Services Patient Feels are Needed: medication management and psychotherapy   Initial Clinical Notes/Concerns: No data recorded  Mental Health Symptoms Depression:  Change in energy/activity; Fatigue; Hopelessness; Increase/decrease in appetite; Irritability; Sleep (too much or little); Tearfulness; Weight gain/loss (losing weight due to ozempic)   Duration of Depressive symptoms: Greater than two weeks   Mania:  None   Anxiety:  Worrying; Tension; Fatigue; Difficulty concentrating; Sleep; Restlessness   Psychosis:  None   Duration of Psychotic symptoms: No data recorded  Trauma:  None   Obsessions:  None   Compulsions:  None   Inattention:  None   Hyperactivity/Impulsivity:  None   Oppositional/Defiant Behaviors:  None   Emotional Irregularity:  None   Other Mood/Personality Symptoms:  No data recorded   Mental Status Exam Appearance and self-care  Stature:  Average   Weight:  Overweight   Clothing:  Casual   Grooming:  Normal   Cosmetic use:  None   Posture/gait:  Normal   Motor activity:  Not Remarkable   Sensorium  Attention:  Normal   Concentration:  Normal   Orientation:  X5    Recall/memory:  Normal   Affect and Mood  Affect:  Depressed   Mood:  Depressed   Relating  Eye contact:  Normal   Facial expression:  Responsive; Depressed   Attitude toward examiner:  Cooperative   Thought and Language  Speech flow: Clear and Coherent   Thought content:  Appropriate to Mood and Circumstances   Preoccupation:  None   Hallucinations:  None   Organization:  No data recorded  [employer] of Knowledge:  Average   Intelligence:  Average   Abstraction:  Normal   Judgement:  Fair   Reality Testing:  Adequate   Insight:  Gaps   Decision Making:  Normal   Social Functioning  Social Maturity:  Responsible   Social Judgement:  Normal   Stress  Stressors:  Grief/losses; Financial; Relationship   Coping Ability:  Deficient supports   Skill Deficits:  Self-care   Supports:  Support needed; Family     Religion: Religion/Spirituality Are You A Religious Person?: No  Leisure/Recreation: Leisure / Recreation Do You Have Hobbies?: Yes Leisure and Hobbies: social media, TV  Exercise/Diet: Exercise/Diet Do You Exercise?: Yes What Type of Exercise Do You Do?:  ("sometimes") How Many Times a Week Do You Exercise?: 1-3 times a week Have You Gained or Lost A Significant Amount of Weight in the Past Six Months?: Yes-Gained Number of Pounds Gained: 20 Do You Follow a Special Diet?: No Do You Have Any Trouble Sleeping?: No (regular sleep patterns now--occasional insomnia)   CCA Employment/Education Employment/Work Situation: Employment / Work Situation Employment Situation: On disability Why is Patient on Disability: Pt reported "depression and carpel tunnel". 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 Did You Attend Graduate School?: No Did You Have An Individualized Education Program (IIEP): No Did You Have Any Difficulty At School?: Yes Were Any Medications Ever Prescribed For These Difficulties?: Yes Medications Prescribed For School Difficulties?: Pt does not remember Patient's Education Has Been Impacted by Current Illness: No   CCA Family/Childhood History Family and Relationship History: Family history Are you sexually active?: No What is your sexual orientation?: Straight Does patient have children?:  Yes How many children?: 2 How is patient's relationship with their children?: Pt has close relationships with her 2 sons.  Childhood History:  Childhood History By whom was/is the patient raised?: Both parents Additional childhood history information: Born in [place]. describes childhood as normal, happy, played outside in the dirt and rode bikes Description of patient's relationship with caregiver when they were a child: Positive How were you disciplined when you got in trouble as a child/adolescent?: " i cant remember.  Probably received a spanking." Does patient have siblings?: Yes Number of Siblings: 3 Description of patient's current relationship with siblings: estranged relationship with siblings.  one brother dx with cancer Did patient suffer any verbal/emotional/physical/sexual abuse as a child?: No Did patient suffer from severe childhood neglect?: No Has patient ever been sexually abused/assaulted/raped as an adolescent or adult?: No Was the patient ever a victim of a crime or a disaster?:  No Witnessed domestic violence?: No Has patient been affected by domestic violence as an adult?: No  Child/Adolescent Assessment:   N/a  CCA Substance Use Alcohol/Drug Use: Alcohol / Drug Use Pain Medications: SEE MAR Prescriptions: SEE MAR Over the Counter: SEE MAR History of alcohol / drug use?: No history of alcohol / drug abuse Longest period of sobriety (when/how long): N/A Negative Consequences of Use:  (N/A) Withdrawal Symptoms:  (N/A)     ASAM's:  Six Dimensions of Multidimensional Assessment  Dimension 1:  Acute Intoxication and/or Withdrawal Potential:   Dimension 1:  Description of individual's past and current experiences of substance use and withdrawal: none  Dimension 2:  Biomedical Conditions and Complications:      Dimension 3:  Emotional, Behavioral, or Cognitive Conditions and Complications:     Dimension 4:  Readiness to Change:     Dimension 5:  Relapse, Continued use, or Continued Problem Potential:     Dimension 6:  Recovery/Living Environment:     ASAM Severity Score: ASAM's Severity Rating Score: 0  ASAM Recommended Level of Treatment: ASAM Recommended Level of Treatment: Level I Outpatient Treatment   Substance use Disorder (SUD) Substance Use Disorder (SUD)  Checklist Symptoms of Substance Use:  (N/A)  Recommendations for Services/Supports/Treatments: Recommendations for Services/Supports/Treatments Recommendations For Services/Supports/Treatments: Individual Therapy, Medication Management  DSM5 Diagnoses: Patient Active Problem List   Diagnosis Date Noted   Shortness of breath 09/25/2021   Severe episode of recurrent major depressive disorder, without psychotic features ([place]) 09/13/2021   EKG abnormalities 09/10/2021   Insomnia due to medical condition 09/05/2021   At risk for prolonged QT interval syndrome 09/05/2021   Prediabetes 03/30/2021   Chronic pain of left knee 03/30/2021   MDD (major depressive disorder), recurrent episode,  moderate ([place]) 11/13/2020   Essential hypertension 10/23/2020   Other specified diabetes mellitus with other specified complication ([place]) 55/97/4163   Recurrent major depressive disorder, in full remission ([place]) 07/25/2020   Tick bite 07/10/2020   Blurred vision 07/10/2020   Lethargy 07/10/2020   High risk medication use 04/06/2020   Panic disorder 07/28/2019   Bereavement 07/28/2019   MDD (major depressive disorder), recurrent episode, with atypical features ([place]) 07/28/2019   Agoraphobia 07/28/2019   Insomnia due to other mental disorder 07/28/2019   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   Tobacco use disorder 12/16/2017   Anxiety 06/25/2015   Carpal tunnel syndrome 06/25/2015   Family planning 06/25/2015   Bloodgood disease 06/25/2015   Fibroid 06/25/2015   Acid reflux 06/25/2015   Excess, menstruation 06/25/2015   Increased BMI 06/25/2015  Class 3 severe obesity due to excess calories with serious comorbidity and body mass index (BMI) of 40.0 to 44.9 in adult [facility]) 06/25/2015   Dysmenorrhea 06/25/2015   Disease of thyroid gland 06/25/2015   Compulsive tobacco user syndrome 06/25/2015   Episodic tension type headache 08/23/2014   Allergic rhinitis 07/23/2013   Wheezing 07/23/2013   Lower urinary tract infectious disease 03/11/2013   Hematuria 03/11/2013   D (diarrhea) 01/13/2013   Vomiting 01/13/2013   Amenorrhea 12/22/2012   HLD (hyperlipidemia) 08/31/2011    Patient Centered Plan: Patient is on the following Treatment Plan(s):  Anxiety and Depression   Referrals to Alternative Service(s): Referred to Alternative Service(s):   Place:   Date:   Time:    Referred to Alternative Service(s):   Place:   Date:   Time:    Referred to Alternative Service(s):   Place:   Date:   Time:    Referred to Alternative Service(s):   Place:   Date:   Time:      Collaboration of Care: Other Pt to continue care with psychiatrist of record, Dr. Shea Evans  Patient/Guardian was advised  Release of Information must be obtained prior to any record release in order to collaborate their care with an outside provider. Patient/Guardian was advised if they have not already done so to contact the registration department to sign all necessary forms in order for [place] to release information regarding their care.   Consent: Patient/Guardian gives verbal consent for treatment and assignment of benefits for services provided during this visit. Patient/Guardian expressed understanding and agreed to proceed.   Bearl Talarico R Athleen Feltner, LCSW

## 2022-03-05 NOTE — Plan of Care (Signed)
Developed treatment plan with pt input ?

## 2022-03-22 ENCOUNTER — Other Ambulatory Visit: Payer: Self-pay

## 2022-03-22 ENCOUNTER — Ambulatory Visit (INDEPENDENT_AMBULATORY_CARE_PROVIDER_SITE_OTHER): Payer: Medicare HMO | Admitting: Obstetrics and Gynecology

## 2022-03-22 DIAGNOSIS — Z3042 Encounter for surveillance of injectable contraceptive: Secondary | ICD-10-CM

## 2022-03-22 MED ORDER — MEDROXYPROGESTERONE ACETATE 150 MG/ML IM SUSP
150.0000 mg | INTRAMUSCULAR | Status: AC
  Administered 2022-03-22 – 2022-06-10 (×2): 150 mg via INTRAMUSCULAR

## 2022-03-22 NOTE — Progress Notes (Signed)
Last Depo-Provera: 01/04/2022 ?Side Effects if any: None ?Serum HCG indicated? N/A. ?Depo-Provera 150 mg IM given by: Cristy Folks, CMA ?Next appointment due: June 9 - June 23 ?

## 2022-03-25 ENCOUNTER — Other Ambulatory Visit: Payer: Self-pay

## 2022-03-25 ENCOUNTER — Encounter: Payer: Self-pay | Admitting: Psychiatry

## 2022-03-25 ENCOUNTER — Telehealth (INDEPENDENT_AMBULATORY_CARE_PROVIDER_SITE_OTHER): Payer: Medicare HMO | Admitting: Psychiatry

## 2022-03-25 DIAGNOSIS — G4701 Insomnia due to medical condition: Secondary | ICD-10-CM | POA: Diagnosis not present

## 2022-03-25 DIAGNOSIS — F1590 Other stimulant use, unspecified, uncomplicated: Secondary | ICD-10-CM | POA: Diagnosis not present

## 2022-03-25 DIAGNOSIS — F17200 Nicotine dependence, unspecified, uncomplicated: Secondary | ICD-10-CM

## 2022-03-25 DIAGNOSIS — F3342 Major depressive disorder, recurrent, in full remission: Secondary | ICD-10-CM | POA: Diagnosis not present

## 2022-03-25 DIAGNOSIS — F4000 Agoraphobia, unspecified: Secondary | ICD-10-CM | POA: Diagnosis not present

## 2022-03-25 DIAGNOSIS — F410 Panic disorder [episodic paroxysmal anxiety] without agoraphobia: Secondary | ICD-10-CM | POA: Diagnosis not present

## 2022-03-25 MED ORDER — BUPROPION HCL ER (SR) 100 MG PO TB12: 100.0000 mg | ORAL_TABLET | Freq: Every day | ORAL | 1 refills | Status: DC

## 2022-03-25 MED ORDER — BREXPIPRAZOLE 0.25 MG PO TABS: 0.2500 mg | ORAL_TABLET | Freq: Every day | ORAL | 1 refills | Status: DC

## 2022-03-25 NOTE — Progress Notes (Signed)
Virtual Visit via Video Note ? ?I connected with Mary Koch on 03/25/22 at 11:20 AM EDT by a video enabled telemedicine application and verified that I am speaking with the correct person using two identifiers. ? ?Location ?Provider Location : [facility] ?Patient Location : Home ? ?Participants: Patient , Son,Provider ?  ?I discussed the limitations of evaluation and management by telemedicine and the availability of in person appointments. The patient expressed understanding and agreed to proceed. ? ?  ?I discussed the assessment and treatment plan with the patient. The patient was provided an opportunity to ask questions and all were answered. The patient agreed with the plan and demonstrated an understanding of the instructions. ?  ?The patient was advised to call back or seek an in-person evaluation if the symptoms worsen or if the condition fails to improve as anticipated. ? ? ?Jennette MD OP Progress Note ? ?03/25/2022 1:06 PM ?Mary Koch  ?MRN:  [id number] ? ?Chief Complaint:  ?Chief Complaint  ?Patient presents with  ? Follow-up: 51 year old Caucasian female who has a history of MDD, panic disorder, agoraphobia, insomnia was evaluated for medication management.  ? ?HPI: Mary Koch is a 51 year old Caucasian female who lives in [place], has a history of MDD, panic disorder, agoraphobia, insomnia, tobacco use disorder, caffeine use disorder was evaluated by telemedicine today. ? ?Patient today reports she is overall doing fairly well with regards to her mood.  Denies any significant sadness, low motivation or crying spells.  Patient reports she continues to have anxiety about day-to-day life situations however she has been managing okay. ? ?Patient reports sleep is interrupted at night since she has puppies who needs her help.  However she reports the current regimen has been helpful.  She is agreeable to working on sleep hygiene. ? ?Denies suicidality, homicidality or perceptual disturbances. ? ?Reports she was  recently started on Ozempic by her primary care provider for weight loss, has lost 6 pounds since then.  She does have nausea however reports she has been able to eat enough for herself.  Agrees to discuss her current nausea with her primary provider. ? ?Patient denies any other concerns today. ? ?Visit Diagnosis:  ?  ICD-10-CM   ?1. MDD (major depressive disorder), recurrent, in full remission ([place])  F33.42 buPROPion ER (WELLBUTRIN SR) 100 MG 12 hr tablet  ?  brexpiprazole (REXULTI) 0.25 MG TABS tablet  ?  ?2. Panic disorder  F41.0 brexpiprazole (REXULTI) 0.25 MG TABS tablet  ?  ?3. Agoraphobia  F40.00   ?  ?4. Insomnia due to medical condition  G47.01   ? mood, osa  ?  ?5. Tobacco use disorder  F17.200   ?  ?6. 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  ? ? ?Past Psychiatric History: Reviewed past psychiatric history from progress note on 03/24/2018.  Past trials of Paxil, Luvox, Klonopin, Zoloft, Seroquel, Wellbutrin, Ambien. ? ?Past Medical History:  ?Past Medical History:  ?Diagnosis Date  ? Allergic rhinitis   ? Anemia   ? Anxiety   ? Anxiety and depression   ? Carpal tunnel syndrome   ? COVID-19 01/15/2020  ? Depression   ? Fibroids   ? GERD (gastroesophageal reflux disease)   ? Headache(784.0)   ? Headache(784.0)   ? Heavy periods   ? Hyperthyroidism   ? right portion of thyroid removed  ? Muscle spasm   ? Neurological disorder   ? evaluation for ms  ? Painful menstrual periods   ? Shortness of  breath   ? when i smoke a lot  ?  ?Past Surgical History:  ?Procedure Laterality Date  ? BREAST BIOPSY Left   ? neg  ? carpel tunnel Left 2017  ? CESAREAN SECTION    ? times 2  ? CHOLECYSTECTOMY    ? THYROID LOBECTOMY    ? ? ?Family Psychiatric History: Reviewed family psychiatric history from progress note on 03/24/2018. ? ?Family History:  ?Family History  ?Problem Relation Age of Onset  ? Osteoporosis Mother   ? Breast cancer Mother 54  ? Diabetes Father   ? Anxiety disorder Father   ? Depression Father   ? Post-traumatic  stress disorder Father   ? Heart disease Father   ? Lung cancer Father   ? Anxiety disorder Sister   ? Alcohol abuse Brother   ? Colon cancer Neg Hx   ? Ovarian cancer Neg Hx   ? ? ?Social History: Reviewed social history from progress note on 03/24/2018. ?Social History  ? ?Socioeconomic History  ? Marital status: Legally Separated  ?  Spouse name: Not on file  ? Number of children: 2  ? Years of education: Not on file  ? Highest education level: High school graduate  ?Occupational History  ? Not on file  ?Tobacco Use  ? Smoking status: Every Day  ?  Packs/day: 1.00  ?  Years: 25.00  ?  Pack years: 25.00  ?  Types: Cigarettes  ?  Start date: 05/22/1986  ? Smokeless tobacco: Never  ?Vaping Use  ? Vaping Use: Never used  ?Substance and Sexual Activity  ? Alcohol use: No  ?  Alcohol/week: 0.0 standard drinks  ? Drug use: No  ? Sexual activity: Yes  ?  Partners: Male  ?  Birth control/protection: Surgical, Injection  ?Other Topics Concern  ? Not on file  ?Social History Narrative  ? Not on file  ? ?Social Determinants of Health  ? ?Financial Resource Strain: Medium Risk  ? Difficulty of Paying Living Expenses: Somewhat hard  ?Food Insecurity: Food Insecurity Present  ? Worried About Charity fundraiser in the Last Year: Sometimes true  ? Ran Out of Food in the Last Year: Sometimes true  ?Transportation Needs: No Transportation Needs  ? Lack of Transportation (Medical): No  ? Lack of Transportation (Non-Medical): No  ?Physical Activity: Insufficiently Active  ? Days of Exercise per Week: 2 days  ? Minutes of Exercise per Session: 10 min  ?Stress: Stress Concern Present  ? Feeling of Stress : Very much  ?Social Connections: Socially Isolated  ? Frequency of Communication with Friends and Family: More than three times a week  ? Frequency of Social Gatherings with Friends and Family: More than three times a week  ? Attends Religious Services: Never  ? Active Member of Clubs or Organizations: No  ? Attends Theatre manager Meetings: Never  ? Marital Status: Separated  ? ? ?Allergies:  ?Allergies  ?Allergen Reactions  ? Azithromycin Other (See Comments)  ?  headache  ? Codeine Other (See Comments)  ?  unknown  ? Penicillins Other (See Comments)  ?  headaches  ? ? ?Metabolic Disorder Labs: ?Lab Results  ?Component Value Date  ? HGBA1C 5.2 03/30/2021  ? ?No results found for: PROLACTIN ?No results found for: CHOL, TRIG, HDL, CHOLHDL, VLDL, LDLCALC ?Lab Results  ?Component Value Date  ? TSH 1.438 09/06/2021  ? ? ?Therapeutic Level Labs: ?No results found for: LITHIUM ?No results found for: VALPROATE ?  No components found for:  CBMZ ? ?Current Medications: ?Current Outpatient Medications  ?Medication Sig Dispense Refill  ? candesartan (ATACAND) 4 MG tablet Take by mouth.    ? gabapentin (NEURONTIN) 600 MG tablet Take by mouth.    ? albuterol (PROVENTIL) (2.5 MG/3ML) 0.083% nebulizer solution Take 3 mLs (2.5 mg total) by nebulization every 6 (six) hours as needed for wheezing or shortness of breath. 150 mL 1  ? albuterol (VENTOLIN HFA) 108 (90 Base) MCG/ACT inhaler INHALE 1 TO 2 PUFFS BY MOUTH DAILY 6.7 each 5  ? brexpiprazole (REXULTI) 0.25 MG TABS tablet Take 1 tablet (0.25 mg total) by mouth daily. 90 tablet 1  ? buPROPion ER (WELLBUTRIN SR) 100 MG 12 hr tablet Take 1 tablet (100 mg total) by mouth daily with breakfast. 90 tablet 1  ? cyclobenzaprine (FLEXERIL) 10 MG tablet Take 10 mg by mouth 3 (three) times daily as needed for muscle spasms.    ? DULoxetine (CYMBALTA) 30 MG capsule Take 1 capsule (30 mg total) by mouth daily. Take along with 60 mg - total of 90 mg daily 90 capsule 0  ? DULoxetine (CYMBALTA) 60 MG capsule TAKE 1 CAPSULE (60 MG TOTAL) BY MOUTH DAILY. TAKE ALONG WITH 30 MG DAILY 90 capsule 0  ? ergocalciferol (VITAMIN D2) 1.25 MG (50000 UT) capsule Take 1 capsule by mouth once a week. TAKES DIFFERENTLY - 1000 MG    ? fluticasone furoate-vilanterol (BREO ELLIPTA) 100-25 MCG/ACT AEPB Inhale 1 puff into the lungs  daily. 30 each 4  ? furosemide (LASIX) 20 MG tablet TAKE 1 TABLET BY MOUTH TWICE A WEEK  5  ? gabapentin (NEURONTIN) 600 MG tablet Take 1 tablet (600 mg total) by mouth 2 (two) times daily. (Patient taking diffe

## 2022-03-29 ENCOUNTER — Ambulatory Visit (INDEPENDENT_AMBULATORY_CARE_PROVIDER_SITE_OTHER): Payer: Medicare HMO | Admitting: Nurse Practitioner

## 2022-03-29 ENCOUNTER — Encounter: Payer: Self-pay | Admitting: Nurse Practitioner

## 2022-03-29 VITALS — BP 140/90 | HR 110 | Ht 66.5 in | Wt 255.8 lb

## 2022-03-29 DIAGNOSIS — Z6841 Body Mass Index (BMI) 40.0 and over, adult: Secondary | ICD-10-CM | POA: Diagnosis not present

## 2022-03-29 DIAGNOSIS — E782 Mixed hyperlipidemia: Secondary | ICD-10-CM

## 2022-03-29 DIAGNOSIS — E785 Hyperlipidemia, unspecified: Secondary | ICD-10-CM

## 2022-03-29 DIAGNOSIS — R232 Flushing: Secondary | ICD-10-CM

## 2022-03-29 MED ORDER — ROSUVASTATIN CALCIUM 20 MG PO TABS: 20.0000 mg | ORAL_TABLET | Freq: Every day | ORAL | 3 refills | Status: DC

## 2022-03-29 NOTE — Assessment & Plan Note (Signed)
Will check labs prolactin, FSH and LH ?Would consider HRT. ?

## 2022-03-29 NOTE — Progress Notes (Signed)
? ?Established Patient Office Visit ? ?Subjective:  ?Patient ID: Mary Koch, female    DOB: October 12, 1971  Age: 51 y.o. MRN: [id number] ? ?CC:  ?Chief Complaint  ?Patient presents with  ? Hot Flashes  ?  Patient would like to discuss if she is going through menopause, she is experiencing headaches, hot flashes, sexual drive decrease.   ? ? ? ?HPI ? ?[facility] presents with menopausal system including hot flashes, sleep disturbances, mood swings, headache and decrease libido. She is experiencing the symptoms from couple of weeks.  ?Patient does not have regular menstrual cycle as she is using depo  for contraceptive.  ?HPI  ? ?Past Medical History:  ?Diagnosis Date  ? Allergic rhinitis   ? Anemia   ? Anxiety   ? Anxiety and depression   ? Carpal tunnel syndrome   ? COVID-19 01/15/2020  ? Depression   ? Fibroids   ? GERD (gastroesophageal reflux disease)   ? Headache(784.0)   ? Headache(784.0)   ? Heavy periods   ? Hyperthyroidism   ? right portion of thyroid removed  ? Muscle spasm   ? Neurological disorder   ? evaluation for ms  ? Painful menstrual periods   ? Shortness of breath   ? when i smoke a lot  ? ? ?Past Surgical History:  ?Procedure Laterality Date  ? BREAST BIOPSY Left   ? neg  ? carpel tunnel Left 2017  ? CESAREAN SECTION    ? times 2  ? CHOLECYSTECTOMY    ? THYROID LOBECTOMY    ? ? ?Family History  ?Problem Relation Age of Onset  ? Osteoporosis Mother   ? Breast cancer Mother 32  ? Diabetes Father   ? Anxiety disorder Father   ? Depression Father   ? Post-traumatic stress disorder Father   ? Heart disease Father   ? Lung cancer Father   ? Anxiety disorder Sister   ? Alcohol abuse Brother   ? Colon cancer Neg Hx   ? Ovarian cancer Neg Hx   ? ? ?Social History  ? ?Socioeconomic History  ? Marital status: Legally Separated  ?  Spouse name: Not on file  ? Number of children: 2  ? Years of education: Not on file  ? Highest education level: High school graduate  ?Occupational History  ? Not on file   ?Tobacco Use  ? Smoking status: Every Day  ?  Packs/day: 1.00  ?  Years: 25.00  ?  Pack years: 25.00  ?  Types: Cigarettes  ?  Start date: 05/22/1986  ? Smokeless tobacco: Never  ?Vaping Use  ? Vaping Use: Never used  ?Substance and Sexual Activity  ? Alcohol use: No  ?  Alcohol/week: 0.0 standard drinks  ? Drug use: No  ? Sexual activity: Yes  ?  Partners: Male  ?  Birth control/protection: Surgical, Injection  ?Other Topics Concern  ? Not on file  ?Social History Narrative  ? Not on file  ? ?Social Determinants of Health  ? ?Financial Resource Strain: Medium Risk  ? Difficulty of Paying Living Expenses: Somewhat hard  ?Food Insecurity: Food Insecurity Present  ? Worried About Charity fundraiser in the Last Year: Sometimes true  ? Ran Out of Food in the Last Year: Sometimes true  ?Transportation Needs: No Transportation Needs  ? Lack of Transportation (Medical): No  ? Lack of Transportation (Non-Medical): No  ?Physical Activity: Insufficiently Active  ? Days of Exercise per Week: 2  days  ? Minutes of Exercise per Session: 10 min  ?Stress: Stress Concern Present  ? Feeling of Stress : Very much  ?Social Connections: Socially Isolated  ? Frequency of Communication with Friends and Family: More than three times a week  ? Frequency of Social Gatherings with Friends and Family: More than three times a week  ? Attends Religious Services: Never  ? Active Member of Clubs or Organizations: No  ? Attends Archivist Meetings: Never  ? Marital Status: Separated  ?Intimate Partner Violence: Not At Risk  ? Fear of Current or Ex-Partner: No  ? Emotionally Abused: No  ? Physically Abused: No  ? Sexually Abused: No  ? ? ? ?Outpatient Medications Prior to Visit  ?Medication Sig Dispense Refill  ? albuterol (PROVENTIL) (2.5 MG/3ML) 0.083% nebulizer solution Take 3 mLs (2.5 mg total) by nebulization every 6 (six) hours as needed for wheezing or shortness of breath. 150 mL 1  ? albuterol (VENTOLIN HFA) 108 (90 Base) MCG/ACT  inhaler INHALE 1 TO 2 PUFFS BY MOUTH DAILY 6.7 each 5  ? brexpiprazole (REXULTI) 0.25 MG TABS tablet Take 1 tablet (0.25 mg total) by mouth daily. 90 tablet 1  ? buPROPion ER (WELLBUTRIN SR) 100 MG 12 hr tablet Take 1 tablet (100 mg total) by mouth daily with breakfast. 90 tablet 1  ? cyclobenzaprine (FLEXERIL) 10 MG tablet Take 10 mg by mouth 3 (three) times daily as needed for muscle spasms.    ? fluticasone furoate-vilanterol (BREO ELLIPTA) 100-25 MCG/ACT AEPB Inhale 1 puff into the lungs daily. 30 each 4  ? meclizine (ANTIVERT) 12.5 MG tablet     ? meloxicam (MOBIC) 7.5 MG tablet Take 1 tablet (7.5 mg total) by mouth daily. 30 tablet 3  ? omeprazole (PRILOSEC) 40 MG capsule TAKE 1 CAPSULE BY MOUTH EVERY DAY 90 capsule 3  ? topiramate (TOPAMAX) 100 MG tablet Take 1.5 tablets (150 mg total) by mouth daily. 150 MG AM AND 200 MG PM ,BEING PRESCRIBED PER NEUROLOGY 135 tablet 1  ? vitamin B-12 (CYANOCOBALAMIN) 1000 MCG tablet Take 1,000 mcg by mouth daily.    ? rosuvastatin (CRESTOR) 20 MG tablet TAKE 1 TABLET BY MOUTH EVERY DAY 90 tablet 3  ? candesartan (ATACAND) 4 MG tablet Take by mouth.    ? DULoxetine (CYMBALTA) 60 MG capsule TAKE 1 CAPSULE (60 MG TOTAL) BY MOUTH DAILY. TAKE ALONG WITH 30 MG DAILY 90 capsule 0  ? gabapentin (NEURONTIN) 600 MG tablet Take 1 tablet (600 mg total) by mouth 2 (two) times daily. (Patient taking differently: Take 900 mg by mouth as directed. 300 mg daily and 600 mg at bedtime) 180 tablet 1  ? Ubrogepant 100 MG TABS Take by mouth. (Patient not taking: Reported on 03/29/2022)    ? DULoxetine (CYMBALTA) 30 MG capsule Take 1 capsule (30 mg total) by mouth daily. Take along with 60 mg - total of 90 mg daily (Patient not taking: Reported on 03/29/2022) 90 capsule 0  ? ergocalciferol (VITAMIN D2) 1.25 MG (50000 UT) capsule Take 1 capsule by mouth once a week. TAKES DIFFERENTLY - 1000 MG (Patient not taking: Reported on 03/29/2022)    ? furosemide (LASIX) 20 MG tablet TAKE 1 TABLET BY MOUTH  TWICE A WEEK (Patient not taking: Reported on 03/29/2022)  5  ? gabapentin (NEURONTIN) 600 MG tablet Take by mouth. (Patient not taking: Reported on 03/29/2022)    ? medroxyPROGESTERone (DEPO-PROVERA) 150 MG/ML injection INJECT 1 ML (150 MG TOTAL) INTO THE MUSCLE  EVERY 3 (THREE) MONTHS. (Patient not taking: Reported on 03/29/2022) 1 mL 3  ? propranolol ER (INDERAL LA) 60 MG 24 hr capsule Take 60 mg by mouth daily. (Patient not taking: Reported on 03/29/2022)    ? Semaglutide, 1 MG/DOSE, 4 MG/3ML SOPN Inject 1 mg as directed once a week. (Patient not taking: Reported on 03/29/2022) 3 mL 3  ? ?Facility-Administered Medications Prior to Visit  ?Medication Dose Route Frequency Provider Last Rate Last Admin  ? medroxyPROGESTERone (DEPO-PROVERA) injection 150 mg  150 mg Intramuscular Q90 days Rubie Maid, MD   150 mg at 03/22/22 1456  ? ? ?Allergies  ?Allergen Reactions  ? Azithromycin Other (See Comments)  ?  headache  ? Codeine Other (See Comments)  ?  unknown  ? Penicillins Other (See Comments)  ?  headaches  ? ? ?ROS ?Review of Systems  ?Constitutional:  Positive for fatigue. Negative for activity change and appetite change.  ?HENT:  Negative for congestion, hearing loss and tinnitus.   ?Eyes:  Negative for discharge and redness.  ?Respiratory:  Negative for cough, chest tightness and shortness of breath.   ?Cardiovascular:  Negative for chest pain and palpitations.  ?Gastrointestinal:  Negative for abdominal pain, blood in stool and constipation.  ?Genitourinary:  Negative for difficulty urinating, frequency and pelvic pain.  ?Musculoskeletal:  Positive for myalgias.  ?Skin:  Negative for color change and pallor.  ?Neurological:  Positive for headaches. Negative for dizziness, seizures and light-headedness.  ?Psychiatric/Behavioral:  Negative for agitation, behavioral problems and confusion.   ? ?  ?Objective:  ?  ?Physical Exam ?Constitutional:   ?   Appearance: Normal appearance. She is obese.  ?HENT:  ?   Head:  Normocephalic and atraumatic.  ?   Right Ear: Tympanic membrane normal.  ?   Left Ear: Tympanic membrane normal.  ?   Nose: Nose normal.  ?   Mouth/Throat:  ?   Mouth: Mucous membranes are moist.  ?   Pharynx: Orop

## 2022-03-29 NOTE — Assessment & Plan Note (Signed)
-

## 2022-03-29 NOTE — Assessment & Plan Note (Signed)
BMI 40.67 ?Advised pt to lose weight. ?Advised patient to avoid trans fat, fatty and fried food. ?Follow a regular physical activity schedule. ?Went over the risk of chronic diseases with increased weight.   ? ? ? ?

## 2022-03-30 LAB — FSH/LH
FSH: 9.8 m[IU]/mL
LH: 3.5 m[IU]/mL

## 2022-03-30 LAB — LIPID PANEL
Cholesterol: 155 mg/dL (ref <200)
HDL: 26 mg/dL — ABNORMAL LOW (ref >=50)
Non-HDL Cholesterol (Calc): 129 mg/dL (calc) (ref <130)
Total CHOL/HDL Ratio: 6 (calc) — ABNORMAL HIGH (ref <5.0)
Triglycerides: 419 mg/dL — ABNORMAL HIGH (ref <150)

## 2022-03-30 LAB — PROLACTIN: Prolactin: 4.3 ng/mL

## 2022-04-06 ENCOUNTER — Other Ambulatory Visit: Payer: Self-pay | Admitting: Certified Nurse Midwife

## 2022-04-06 ENCOUNTER — Other Ambulatory Visit: Payer: Self-pay | Admitting: Psychiatry

## 2022-04-06 DIAGNOSIS — N921 Excessive and frequent menstruation with irregular cycle: Secondary | ICD-10-CM

## 2022-04-06 DIAGNOSIS — F331 Major depressive disorder, recurrent, moderate: Secondary | ICD-10-CM

## 2022-04-06 DIAGNOSIS — F410 Panic disorder [episodic paroxysmal anxiety] without agoraphobia: Secondary | ICD-10-CM

## 2022-04-08 ENCOUNTER — Other Ambulatory Visit: Payer: Self-pay | Admitting: Psychiatry

## 2022-04-08 DIAGNOSIS — F410 Panic disorder [episodic paroxysmal anxiety] without agoraphobia: Secondary | ICD-10-CM

## 2022-04-08 DIAGNOSIS — F331 Major depressive disorder, recurrent, moderate: Secondary | ICD-10-CM

## 2022-04-16 ENCOUNTER — Ambulatory Visit (INDEPENDENT_AMBULATORY_CARE_PROVIDER_SITE_OTHER): Payer: Medicare HMO | Admitting: Licensed Clinical Social Worker

## 2022-04-16 DIAGNOSIS — F331 Major depressive disorder, recurrent, moderate: Secondary | ICD-10-CM | POA: Diagnosis not present

## 2022-04-16 NOTE — Plan of Care (Signed)
?  Problem: Reduce overall frequency, intensity, and duration of the anxiety so that daily functioning is not impaired per pt self report 3 out of 5 sessions documented.  Anxiety Disorder CCP Problem  1  ?Goal: LTG: Patient will score less than 5 on the Generalized Anxiety Disorder 7 Scale (GAD-7) ?Outcome: Progressing ?Intervention: Work with patient to identify the major components of a recent episode of anxiety: physical symptoms, major thoughts and images, and major behaviors they experienced ?Intervention: Encourage patient to identify triggers ?Intervention: Encourage self-care activities ?  ?Problem: Decrease depressive symptoms and improve levels of effective functioning-pt reports a decrease in overall depression symptoms 3 out of 5 sessions documented. Depression CCP Problem  1  ?Goal: LTG: Reduce frequency, intensity, and duration of depression symptoms as evidenced by: pt self report  ?Outcome: Progressing ?Goal: STG: [name] WILL PARTICIPATE IN AT LEAST 80% OF SCHEDULED INDIVIDUAL PSYCHOTHERAPY SESSIONS ?Outcome: Progressing ?Intervention: Encourage verbalization of feelings/concerns/expectations ?Intervention: REVIEW PLEASE SKILLS (TREAT PHYSICAL ILLNESS, BALANCE EATING, AVOID MOOD-ALTERING SUBSTANCES, BALANCE SLEEP AND GET EXERCISE) WITH [name] ?Intervention: Perform motivational interviewing regarding physical activity ?  ?

## 2022-04-16 NOTE — Progress Notes (Signed)
Virtual Visit via Video Note ? ?I connected with Mary Koch on 04/17/22 at  1:00 PM EDT by a video enabled telemedicine application and verified that I am speaking with the correct person using two identifiers. ? ?Location: ?Patient: home ?Provider: remote office [place]) ?  ?I discussed the limitations of evaluation and management by telemedicine and the availability of in person appointments. The patient expressed understanding and agreed to proceed. ?  ?I discussed the assessment and treatment plan with the patient. The patient was provided an opportunity to ask questions and all were answered. The patient agreed with the plan and demonstrated an understanding of the instructions. ?  ?The patient was advised to call back or seek an in-person evaluation if the symptoms worsen or if the condition fails to improve as anticipated. ? ?I provided 40 minutes of non-face-to-face time during this encounter. ? ? ?Jamarius Saha R [address], LCSW ? ? ?THERAPIST PROGRESS NOTE ? ?Session Time: 1-140p ? ?Participation Level: Active ? ?Behavioral Response: Neat and Well GroomedAlertAnxious and Depressed ? ?Type of Therapy: Individual Therapy ? ?Treatment Goals addressed: Problem: Reduce overall frequency, intensity, and duration of the anxiety so that daily functioning is not impaired per pt self report 3 out of 5 sessions documented.  Anxiety Disorder CCP Problem  1  ?Goal: LTG: Patient will score less than 5 on the Generalized Anxiety Disorder 7 Scale (GAD-7) ?Outcome: Progressing ?Intervention: Work with patient to identify the major components of a recent episode of anxiety: physical symptoms, major thoughts and images, and major behaviors they experienced ?Intervention: Encourage patient to identify triggers ?Intervention: Encourage self-care activities ?  ?Problem: Decrease depressive symptoms and improve levels of effective functioning-pt reports a decrease in overall depression symptoms 3 out of 5 sessions  documented. Depression CCP Problem  1  ?Goal: LTG: Reduce frequency, intensity, and duration of depression symptoms as evidenced by: pt self report  ?Outcome: Progressing ?Goal: STG: Shealynn WILL PARTICIPATE IN AT LEAST 80% OF SCHEDULED INDIVIDUAL PSYCHOTHERAPY SESSIONS ?Outcome: Progressing ?Intervention: Encourage verbalization of feelings/concerns/expectations ?Intervention: REVIEW PLEASE SKILLS (TREAT PHYSICAL ILLNESS, BALANCE EATING, AVOID MOOD-ALTERING SUBSTANCES, BALANCE SLEEP AND GET EXERCISE) WITH [place] ?Intervention: Perform motivational interviewing regarding physical activity ?  ? ?ProgressTowards Goals: Progressing ? ?Interventions: Motivational Interviewing and Supportive ? ?Summary: Mary Koch is a 51 y.o. female who presents with iimproving symptoms related to depression and anxiety. Pt reports that she is compliant with her medication and is managing symptoms well.  ? ?Allowed pt to explore and express thoughts and feelings associated with recent life situations and external stressors. Patient reports that she's experiencing some stress associated with a friend from the past that wants to move in with patient. Patient reports that the thoughts of having someone move in with her trigger significant anxiety. Patient reports that her anxiety is so high she is thinking about moving out of her home and moving into her mother's home to avoid the complications of having her friend move in with her. Patient reports the only barrier to moving in with her mother is having six dogs, and having to get rid of her six dogs. Patient reports that her friend has six cats and a dog, so patient does not want these animals combining with her animals in the home. Allowed patient to explore her thoughts and feelings associated with her brother who has cancer. Patient reports she has been avoiding thinking about the situation and has been avoiding her brother because she is fearful of interacting with him while  he is  going through this. Patient reports that health wise she's doing well, she is managing behaviors related to diabetes and high blood pressure. Patient reports that she is spending time with loved ones, and spending time with her animals, which makes her feel happy. Reviewed importance of self-care activities, and focusing on overall emotional and physical wellness. ? ?Continued recommendations are as follows: self care behaviors, positive social engagements, focusing on overall work/home/life balance, and focusing on positive physical and emotional wellness.  ? ? ?Suicidal/Homicidal: No ? ?Therapist Response: Pt is continuing to apply interventions learned in session into daily life situations. Pt is currently on track to meet goals utilizing interventions mentioned above. Personal growth and progress noted. Treatment to continue as indicated.  ? ?Plan: Return again in 4 weeks. ? ?Diagnosis: MDD (major depressive disorder), recurrent episode, moderate ([place]) ? ?Collaboration of Care: Other pt encouraged to continue care with psychiatrist of record, Dr. Ursula Alert ? ?Patient/Guardian was advised Release of Information must be obtained prior to any record release in order to collaborate their care with an outside provider. Patient/Guardian was advised if they have not already done so to contact the registration department to sign all necessary forms in order for [place] to release information regarding their care.  ? ?Consent: Patient/Guardian gives verbal consent for treatment and assignment of benefits for services provided during this visit. Patient/Guardian expressed understanding and agreed to proceed.  ? ?Mary Koch R Marvelous Bouwens, LCSW ?04/17/2022 ? ?

## 2022-04-17 NOTE — Plan of Care (Signed)
?  Problem: Reduce overall frequency, intensity, and duration of the anxiety so that daily functioning is not impaired per pt self report 3 out of 5 sessions documented.  Anxiety Disorder CCP Problem  1  ?Goal: LTG: Patient will score less than 5 on the Generalized Anxiety Disorder 7 Scale (GAD-7) ?Outcome: Progressing ?Intervention: Work with patient to identify the major components of a recent episode of anxiety: physical symptoms, major thoughts and images, and major behaviors they experienced ?Intervention: Encourage patient to identify triggers ?Intervention: Encourage self-care activities ?  ?Problem: Decrease depressive symptoms and improve levels of effective functioning-pt reports a decrease in overall depression symptoms 3 out of 5 sessions documented. Depression CCP Problem  1  ?Goal: LTG: Reduce frequency, intensity, and duration of depression symptoms as evidenced by: pt self report  ?Outcome: Progressing ?Goal: STG: [place] WILL PARTICIPATE IN AT LEAST 80% OF SCHEDULED INDIVIDUAL PSYCHOTHERAPY SESSIONS ?Outcome: Progressing ?Intervention: Encourage verbalization of feelings/concerns/expectations ?Intervention: REVIEW PLEASE SKILLS (TREAT PHYSICAL ILLNESS, BALANCE EATING, AVOID MOOD-ALTERING SUBSTANCES, BALANCE SLEEP AND GET EXERCISE) WITH [place] ?Intervention: Perform motivational interviewing regarding physical activity ?  ?

## 2022-04-25 NOTE — Progress Notes (Signed)
Please call pt with the abnormal result and make a follow up appointment.

## 2022-04-29 ENCOUNTER — Ambulatory Visit: Payer: Medicare HMO | Admitting: Internal Medicine

## 2022-05-02 ENCOUNTER — Encounter: Payer: Self-pay | Admitting: Nurse Practitioner

## 2022-05-02 ENCOUNTER — Ambulatory Visit (INDEPENDENT_AMBULATORY_CARE_PROVIDER_SITE_OTHER): Payer: Medicare HMO | Admitting: Nurse Practitioner

## 2022-05-02 VITALS — BP 128/84 | HR 82 | Ht 66.5 in | Wt 253.4 lb

## 2022-05-02 DIAGNOSIS — I10 Essential (primary) hypertension: Secondary | ICD-10-CM

## 2022-05-02 DIAGNOSIS — Z6841 Body Mass Index (BMI) 40.0 and over, adult: Secondary | ICD-10-CM | POA: Diagnosis not present

## 2022-05-02 DIAGNOSIS — E66813 Obesity, class 3: Secondary | ICD-10-CM

## 2022-05-02 DIAGNOSIS — F17200 Nicotine dependence, unspecified, uncomplicated: Secondary | ICD-10-CM | POA: Diagnosis not present

## 2022-05-02 DIAGNOSIS — E782 Mixed hyperlipidemia: Secondary | ICD-10-CM | POA: Diagnosis not present

## 2022-05-02 NOTE — Assessment & Plan Note (Signed)
Smoking cessation was discussed, 5-7 minutes was spent of this topic specifically.   

## 2022-05-02 NOTE — Assessment & Plan Note (Signed)
BMI 40.29 in the office today ?Advised pt to lose weight. ?Advised patient to avoid trans fat, fatty and fried food. ?Follow a regular physical activity schedule. ?Went over the risk of chronic diseases with increased weight.   ? ? ? ?

## 2022-05-02 NOTE — Assessment & Plan Note (Signed)
Continue statin. 

## 2022-05-02 NOTE — Progress Notes (Signed)
? ?Established Patient Office Visit ? ?Subjective:  ?Patient ID: Mary Koch, female    DOB: [birth year]  Age: 51 y.o. MRN: [id number] ? ?CC:  ?Chief Complaint  ?Patient presents with  ? Follow-up  ? ? ? ?HPI ? ?Mary Koch presents for routine follow up. She has perimenopausal symptoms. No other concerns at preset.  ? ?HPI  ? ?Past Medical History:  ?Diagnosis Date  ? Allergic rhinitis   ? Anemia   ? Anxiety   ? Anxiety and depression   ? Carpal tunnel syndrome   ? COVID-19 01/15/2020  ? Depression   ? Fibroids   ? GERD (gastroesophageal reflux disease)   ? Headache(784.0)   ? Headache(784.0)   ? Heavy periods   ? Hyperthyroidism   ? right portion of thyroid removed  ? Muscle spasm   ? Neurological disorder   ? evaluation for ms  ? Painful menstrual periods   ? Shortness of breath   ? when i smoke a lot  ? ? ?Past Surgical History:  ?Procedure Laterality Date  ? BREAST BIOPSY Left   ? neg  ? carpel tunnel Left 2017  ? CESAREAN SECTION    ? times 2  ? CHOLECYSTECTOMY    ? THYROID LOBECTOMY    ? ? ?Family History  ?Problem Relation Age of Onset  ? Osteoporosis Mother   ? Breast cancer Mother 54  ? Diabetes Father   ? Anxiety disorder Father   ? Depression Father   ? Post-traumatic stress disorder Father   ? Heart disease Father   ? Lung cancer Father   ? Anxiety disorder Sister   ? Alcohol abuse Brother   ? Colon cancer Neg Hx   ? Ovarian cancer Neg Hx   ? ? ?Social History  ? ?Socioeconomic History  ? Marital status: Legally Separated  ?  Spouse name: Not on file  ? Number of children: 2  ? Years of education: Not on file  ? Highest education level: High school graduate  ?Occupational History  ? Not on file  ?Tobacco Use  ? Smoking status: Every Day  ?  Packs/day: 1.00  ?  Years: 25.00  ?  Pack years: 25.00  ?  Types: Cigarettes  ?  Start date: 05/22/1986  ? Smokeless tobacco: Never  ?Vaping Use  ? Vaping Use: Never used  ?Substance and Sexual Activity  ? Alcohol use: No  ?  Alcohol/week: 0.0 standard drinks  ?  Drug use: No  ? Sexual activity: Yes  ?  Partners: Male  ?  Birth control/protection: Surgical, Injection  ?Other Topics Concern  ? Not on file  ?Social History Narrative  ? Not on file  ? ?Social Determinants of Health  ? ?Financial Resource Strain: Medium Risk  ? Difficulty of Paying Living Expenses: Somewhat hard  ?Food Insecurity: Food Insecurity Present  ? Worried About Charity fundraiser in the Last Year: Sometimes true  ? Ran Out of Food in the Last Year: Sometimes true  ?Transportation Needs: No Transportation Needs  ? Lack of Transportation (Medical): No  ? Lack of Transportation (Non-Medical): No  ?Physical Activity: Insufficiently Active  ? Days of Exercise per Week: 2 days  ? Minutes of Exercise per Session: 10 min  ?Stress: Stress Concern Present  ? Feeling of Stress : Very much  ?Social Connections: Socially Isolated  ? Frequency of Communication with Friends and Family: More than three times a week  ? Frequency of Social Gatherings with  Friends and Family: More than three times a week  ? Attends Religious Services: Never  ? Active Member of Clubs or Organizations: No  ? Attends Archivist Meetings: Never  ? Marital Status: Separated  ?Intimate Partner Violence: Not At Risk  ? Fear of Current or Ex-Partner: No  ? Emotionally Abused: No  ? Physically Abused: No  ? Sexually Abused: No  ? ? ? ?Outpatient Medications Prior to Visit  ?Medication Sig Dispense Refill  ? albuterol (PROVENTIL) (2.5 MG/3ML) 0.083% nebulizer solution Take 3 mLs (2.5 mg total) by nebulization every 6 (six) hours as needed for wheezing or shortness of breath. 150 mL 1  ? albuterol (VENTOLIN HFA) 108 (90 Base) MCG/ACT inhaler INHALE 1 TO 2 PUFFS BY MOUTH DAILY 6.7 each 5  ? brexpiprazole (REXULTI) 0.25 MG TABS tablet Take 1 tablet (0.25 mg total) by mouth daily. 90 tablet 1  ? buPROPion ER (WELLBUTRIN SR) 100 MG 12 hr tablet Take 1 tablet (100 mg total) by mouth daily with breakfast. 90 tablet 1  ? cyclobenzaprine  (FLEXERIL) 10 MG tablet Take 10 mg by mouth 3 (three) times daily as needed for muscle spasms.    ? DULoxetine (CYMBALTA) 60 MG capsule TAKE 1 CAPSULE (60 MG TOTAL) BY MOUTH DAILY. TAKE ALONG WITH 30 MG DAILY 90 capsule 0  ? fluticasone furoate-vilanterol (BREO ELLIPTA) 100-25 MCG/ACT AEPB Inhale 1 puff into the lungs daily. 30 each 4  ? gabapentin (NEURONTIN) 600 MG tablet Take 1 tablet (600 mg total) by mouth 2 (two) times daily. (Patient taking differently: Take 900 mg by mouth as directed. 300 mg daily and 600 mg at bedtime) 180 tablet 1  ? rosuvastatin (CRESTOR) 20 MG tablet Take 1 tablet (20 mg total) by mouth daily. 90 tablet 3  ? omeprazole (PRILOSEC) 40 MG capsule TAKE 1 CAPSULE BY MOUTH EVERY DAY 90 capsule 3  ? meclizine (ANTIVERT) 12.5 MG tablet     ? OZEMPIC, 1 MG/DOSE, 4 MG/3ML SOPN Inject 1 mg into the skin once a week.    ? vitamin B-12 (CYANOCOBALAMIN) 1000 MCG tablet Take 1,000 mcg by mouth daily.    ? candesartan (ATACAND) 4 MG tablet Take by mouth. Patient not taking     ? meloxicam (MOBIC) 7.5 MG tablet Take 1 tablet (7.5 mg total) by mouth daily. 30 tablet 3  ? topiramate (TOPAMAX) 100 MG tablet Take 1.5 tablets (150 mg total) by mouth daily. 150 MG AM AND 200 MG PM ,BEING PRESCRIBED PER NEUROLOGY 135 tablet 1  ? Ubrogepant 100 MG TABS Take by mouth. (Patient not taking: Reported on 03/29/2022)    ? ?Facility-Administered Medications Prior to Visit  ?Medication Dose Route Frequency Provider Last Rate Last Admin  ? medroxyPROGESTERone (DEPO-PROVERA) injection 150 mg  150 mg Intramuscular Q90 days Rubie Maid, MD   150 mg at 03/22/22 1456  ? ? ?Allergies  ?Allergen Reactions  ? Azithromycin Other (See Comments)  ?  headache  ? Codeine Other (See Comments)  ?  unknown  ? Penicillins Other (See Comments)  ?  headaches  ? ? ?ROS ?Review of Systems ? ?  ?Objective:  ?  ?Physical Exam ? ?BP 128/84   Pulse 82   Ht 5' 6.5" (1.689 m)   Wt 253 lb 6.4 oz (114.9 kg)   BMI 40.29 kg/m?  ?Wt Readings from  Last 3 Encounters:  ?05/02/22 253 lb 6.4 oz (114.9 kg)  ?03/29/22 255 lb 12.8 oz (116 kg)  ?02/26/22 255 lb 14.4  oz (116.1 kg)  ? ? ? ?Health Maintenance Due  ?Topic Date Due  ? COVID-19 Vaccine (1) Never done  ? FOOT EXAM  Never done  ? OPHTHALMOLOGY EXAM  Never done  ? HIV Screening  Never done  ? Hepatitis C Screening  Never done  ? COLONOSCOPY (Pts 45-42yr Insurance coverage will need to be confirmed)  Never done  ? PAP SMEAR-Modifier  10/30/2020  ? HEMOGLOBIN A1C  09/29/2021  ? ? ?There are no preventive care reminders to display for this patient. ? ?Lab Results  ?Component Value Date  ? TSH 1.438 09/06/2021  ? ?Lab Results  ?Component Value Date  ? WBC 12.8 (H) 05/18/2017  ? HGB 13.9 05/18/2017  ? HCT 40.8 05/18/2017  ? MCV 83.2 05/18/2017  ? PLT 251 05/18/2017  ? ?Lab Results  ?Component Value Date  ? NA 138 05/18/2017  ? K 4.5 05/18/2017  ? CO2 23 05/18/2017  ? GLUCOSE 110 (H) 05/18/2017  ? BUN 15 05/18/2017  ? CREATININE 0.85 05/18/2017  ? BILITOT 0.7 05/18/2017  ? ALKPHOS 66 05/18/2017  ? AST 24 05/18/2017  ? ALT 18 05/18/2017  ? PROT 7.3 05/18/2017  ? ALBUMIN 3.7 05/18/2017  ? CALCIUM 9.2 05/18/2017  ? ANIONGAP 7 05/18/2017  ? ?Lab Results  ?Component Value Date  ? CHOL 155 03/29/2022  ? ?Lab Results  ?Component Value Date  ? HDL 26 (L) 03/29/2022  ? ?Lab Results  ?Component Value Date  ? L[place] 03/29/2022  ?   Comment:  ?   . ?LDL cholesterol not calculated. Triglyceride levels ?greater than 400 mg/dL invalidate calculated LDL results. ?. ?Reference range: <100 ?.Marland Kitchen?Desirable range <100 mg/dL for primary prevention;   ?<70 mg/dL for patients with CHD or diabetic patients  ?with > or = 2 CHD risk factors. ?. ?LDL-C is now calculated using the Martin-Hopkins  ?calculation, which is a validated novel method providing  ?better accuracy than the Friedewald equation in the  ?estimation of LDL-C.  ?MCresenciano Genreet al. JAnnamaria Helling 27544;920(10: 2061-2068  ?(http://education.[facility].com/faq/FAQ164) ?  ? ?Lab  Results  ?Component Value Date  ? TRIG 419 (H) 03/29/2022  ? ?Lab Results  ?Component Value Date  ? CHOLHDL 6.0 (H) 03/29/2022  ? ?Lab Results  ?Component Value Date  ? HGBA1C 5.2 03/30/2021  ? ? ?  ?Assessment &

## 2022-05-02 NOTE — Assessment & Plan Note (Signed)
BP 128/84 in the office today. ?Patient is not taking any medication for BP. ?Managed with life style modification.  ?Will continue to monitor. ?

## 2022-05-20 DIAGNOSIS — R202 Paresthesia of skin: Secondary | ICD-10-CM | POA: Diagnosis not present

## 2022-05-20 DIAGNOSIS — R200 Anesthesia of skin: Secondary | ICD-10-CM | POA: Diagnosis not present

## 2022-05-22 ENCOUNTER — Encounter: Payer: Medicare HMO | Admitting: Obstetrics

## 2022-05-22 ENCOUNTER — Telehealth (INDEPENDENT_AMBULATORY_CARE_PROVIDER_SITE_OTHER): Payer: Medicare HMO | Admitting: Psychiatry

## 2022-05-22 ENCOUNTER — Encounter: Payer: Self-pay | Admitting: Psychiatry

## 2022-05-22 DIAGNOSIS — F1590 Other stimulant use, unspecified, uncomplicated: Secondary | ICD-10-CM

## 2022-05-22 DIAGNOSIS — F410 Panic disorder [episodic paroxysmal anxiety] without agoraphobia: Secondary | ICD-10-CM

## 2022-05-22 DIAGNOSIS — F17200 Nicotine dependence, unspecified, uncomplicated: Secondary | ICD-10-CM

## 2022-05-22 DIAGNOSIS — F4000 Agoraphobia, unspecified: Secondary | ICD-10-CM

## 2022-05-22 DIAGNOSIS — G4701 Insomnia due to medical condition: Secondary | ICD-10-CM

## 2022-05-22 DIAGNOSIS — F3342 Major depressive disorder, recurrent, in full remission: Secondary | ICD-10-CM | POA: Diagnosis not present

## 2022-05-22 MED ORDER — DULOXETINE HCL 60 MG PO CPEP: 60.0000 mg | ORAL_CAPSULE | Freq: Every day | ORAL | 0 refills | Status: DC

## 2022-05-22 NOTE — Progress Notes (Signed)
Virtual Visit via Video Note  I connected with Mary Koch on 05/22/22 at 10:00 AM EDT by a video enabled telemedicine application and verified that I am speaking with the correct person using two identifiers.  Location Provider Location : [facility] Patient Location : Home  Participants: Patient , Provider   I discussed the limitations of evaluation and management by telemedicine and the availability of in person appointments. The patient expressed understanding and agreed to proceed.    I discussed the assessment and treatment plan with the patient. The patient was provided an opportunity to ask questions and all were answered. The patient agreed with the plan and demonstrated an understanding of the instructions.   The patient was advised to call back or seek an in-person evaluation if the symptoms worsen or if the condition fails to improve as anticipated.   [place] MD OP Progress Note  05/22/2022 1:37 PM Adabella Lenisha Koch  MRN:  [id number]  Chief Complaint:  Chief Complaint  Patient presents with   Follow-up: 51 year old Caucasian female who has a history of MDD, panic disorder, agoraphobia, insomnia was evaluated for medication management.   HPI: Mary Koch is a 51 year old Caucasian female who lives in [place], has a history of MDD, panic disorder, agoraphobia, insomnia, tobacco use disorder, caffeine use disorder was evaluated by telemedicine today.  Patient today reports she is currently doing fairly well with regards to her depression and anxiety symptoms.  She does have episodes of feeling down especially about being overweight.  She is currently on Ozempic and is trying to lose weight.  Patient reports she also continues to be socially anxious.  She  stays to herself most of the time.  Her son who is almost 94 years old currently supports her with shopping and other chores.  She is currently in psychotherapy sessions and reports she will discuss with her therapist.  Reports sleep  as good.  Reports appetite is fair.  Denies suicidality, homicidality or perceptual disturbances.  Patient is currently compliant on all her medications.  Denies side effects.    Visit Diagnosis:    ICD-10-CM   1. MDD (major depressive disorder), recurrent, in full remission ([place])  F33.42     2. Panic disorder  F41.0 DULoxetine (CYMBALTA) 60 MG capsule    3. Agoraphobia  F40.00     4. Insomnia due to medical condition  G47.01    mood, OSA    5. Tobacco use disorder  F17.200     6. 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  Past trials of Paxil, Luvox, Klonopin, Zoloft, Seroquel, Wellbutrin, Ambien.  Past Medical History:  Past Medical History:  Diagnosis Date   Allergic rhinitis    Anemia    Anxiety    Anxiety and depression    Carpal tunnel syndrome    COVID-19 01/15/2020   Depression    Fibroids    GERD (gastroesophageal reflux disease)    Headache(784.0)    Headache(784.0)    Heavy periods    Hyperthyroidism    right portion of thyroid removed   Muscle spasm    Neurological disorder    evaluation for ms   Painful menstrual periods    Shortness of breath    when i smoke a lot    Past Surgical History:  Procedure Laterality Date   BREAST BIOPSY Left    neg   carpel tunnel Left  2017   CESAREAN SECTION     times 2   CHOLECYSTECTOMY     THYROID LOBECTOMY      Family Psychiatric History: Reviewed family psychiatric history from progress note on 03/24/2018.  Family History:  Family History  Problem Relation Age of Onset   Osteoporosis Mother    Breast cancer Mother 47   Diabetes Father    Anxiety disorder Father    Depression Father    Post-traumatic stress disorder Father    Heart disease Father    Lung cancer Father    Anxiety disorder Sister    Alcohol abuse Brother    Colon cancer Neg Hx    Ovarian cancer Neg Hx     Social History: Reviewed social  history from progress note on 03/24/2018. Social History   Socioeconomic History   Marital status: Legally Separated    Spouse name: Not on file   Number of children: 2   Years of education: Not on file   Highest education level: High school graduate  Occupational History   Not on file  Tobacco Use   Smoking status: Every Day    Packs/day: 1.00    Years: 25.00    Pack years: 25.00    Types: Cigarettes    Start date: 05/22/1986   Smokeless tobacco: Never  Vaping Use   Vaping Use: Never used  Substance and Sexual Activity   Alcohol use: No    Alcohol/week: 0.0 standard drinks   Drug use: No   Sexual activity: Yes    Partners: Male    Birth control/protection: Surgical, Injection  Other Topics Concern   Not on file  Social History Narrative   Not on file   Social Determinants of Health   Financial Resource Strain: Medium Risk   Difficulty of Paying Living Expenses: Somewhat hard  Food Insecurity: Food Insecurity Present   Worried About [place] in the Last Year: Sometimes true   Ran Out of Food in the Last Year: Sometimes true  Transportation Needs: No Transportation Needs   Lack of Transportation (Medical): No   Lack of Transportation (Non-Medical): No  Physical Activity: Insufficiently Active   Days of Exercise per Week: 2 days   Minutes of Exercise per Session: 10 min  Stress: Stress Concern Present   Feeling of Stress : Very much  Social Connections: Socially Isolated   Frequency of Communication with Friends and Family: More than three times a week   Frequency of Social Gatherings with Friends and Family: More than three times a week   Attends Religious Services: Never   Marine scientist or Organizations: No   Attends Archivist Meetings: Never   Marital Status: Separated    Allergies:  Allergies  Allergen Reactions   Azithromycin Other (See Comments)    headache   Codeine Other (See Comments)    unknown   Penicillins Other  (See Comments)    headaches    Metabolic Disorder Labs: Lab Results  Component Value Date   HGBA1C 5.2 03/30/2021   Lab Results  Component Value Date   PROLACTIN 4.3 03/29/2022   Lab Results  Component Value Date   CHOL 155 03/29/2022   TRIG 419 (H) 03/29/2022   HDL 26 (L) 03/29/2022   CHOLHDL 6.0 (H) 03/29/2022   LDLCALC  03/29/2022     Comment:     . LDL cholesterol not calculated. Triglyceride levels greater than 400 mg/dL invalidate calculated LDL results. . Reference range: <100 .  Desirable range <100 mg/dL for primary prevention;   <70 mg/dL for patients with CHD or diabetic patients  with > or = 2 CHD risk factors. Marland Kitchen LDL-C is now calculated using the Martin-Hopkins  calculation, which is a validated novel method providing  better accuracy than the Friedewald equation in the  estimation of LDL-C.  Cresenciano Genre et al. Annamaria Helling. 6195;093(26): 2061-2068  (http://education.[facility].com/faq/FAQ164)    Lab Results  Component Value Date   TSH 1.438 09/06/2021    Therapeutic Level Labs: No results found for: LITHIUM No results found for: VALPROATE No components found for:  CBMZ  Current Medications: Current Outpatient Medications  Medication Sig Dispense Refill   albuterol (PROVENTIL) (2.5 MG/3ML) 0.083% nebulizer solution Take 3 mLs (2.5 mg total) by nebulization every 6 (six) hours as needed for wheezing or shortness of breath. 150 mL 1   albuterol (VENTOLIN HFA) 108 (90 Base) MCG/ACT inhaler INHALE 1 TO 2 PUFFS BY MOUTH DAILY 6.7 each 5   brexpiprazole (REXULTI) 0.25 MG TABS tablet Take 1 tablet (0.25 mg total) by mouth daily. 90 tablet 1   buPROPion ER (WELLBUTRIN SR) 100 MG 12 hr tablet Take 1 tablet (100 mg total) by mouth daily with breakfast. 90 tablet 1   cyclobenzaprine (FLEXERIL) 10 MG tablet Take 10 mg by mouth 3 (three) times daily as needed for muscle spasms.     DULoxetine (CYMBALTA) 60 MG capsule Take 1 capsule (60 mg total) by mouth daily. 90  capsule 0   fluticasone furoate-vilanterol (BREO ELLIPTA) 100-25 MCG/ACT AEPB Inhale 1 puff into the lungs daily. 30 each 4   gabapentin (NEURONTIN) 600 MG tablet Take 1 tablet (600 mg total) by mouth 2 (two) times daily. (Patient taking differently: Take 900 mg by mouth as directed. 300 mg daily and 600 mg at bedtime) 180 tablet 1   meclizine (ANTIVERT) 12.5 MG tablet      OZEMPIC, 1 MG/DOSE, 4 MG/3ML SOPN Inject 1 mg into the skin once a week.     rosuvastatin (CRESTOR) 20 MG tablet Take 1 tablet (20 mg total) by mouth daily. 90 tablet 3   vitamin B-12 (CYANOCOBALAMIN) 1000 MCG tablet Take 1,000 mcg by mouth daily.     Current Facility-Administered Medications  Medication Dose Route Frequency Provider Last Rate Last Admin   medroxyPROGESTERone (DEPO-PROVERA) injection 150 mg  150 mg Intramuscular Q90 days Rubie Maid, MD   150 mg at 03/22/22 1456     Musculoskeletal: Strength & Muscle Tone:  UTA Gait & Station:  seated Patient leans: N/A  Psychiatric Specialty Exam: Review of Systems  Psychiatric/Behavioral:  Negative for agitation, behavioral problems, confusion, decreased concentration, dysphoric mood, hallucinations, self-injury, sleep disturbance and suicidal ideas. The patient is not nervous/anxious and is not hyperactive.   All other systems reviewed and are negative.  There were no vitals taken for this visit.There is no height or weight on file to calculate BMI.  General Appearance: Casual  Eye Contact:  Fair  Speech:  Clear and Coherent  Volume:  Normal  Mood:  Euthymic  Affect:  Congruent  Thought Process:  Goal Directed and Descriptions of Associations: Intact  Orientation:  Full (Time, Place, and Person)  Thought Content: Logical   Suicidal Thoughts:  No  Homicidal Thoughts:  No  Memory:  Immediate;   Fair Recent;   Fair Remote;   Fair  Judgement:  Fair  Insight:  Fair  Psychomotor Activity:  Normal  Concentration:  Concentration: Fair and Attention Span: Fair   Recall:  [place] of Knowledge: Fair  Language: Fair  Akathisia:  No  Handed:  Right  AIMS (if indicated): done  Assets:  Communication Skills Desire for Improvement Social Support  ADL's:  Intact  Cognition: WNL  Sleep:  Fair   Screenings: AIMS    Flowsheet Row Video Visit from 05/22/2022 in [facility] Total Score 0      AUDIT    [place] Office Visit from 05/22/2016 in [facility]  Alcohol Use Disorder Identification Test Final Score (AUDIT) 0      GAD-7    Flowsheet Row Counselor from 03/05/2022 in [facility] from 10/06/2020 in [facility] Office Visit from 08/10/2019 in [facility]  Total GAD-7 Score '20 15 14      '$ [place] from 09/27/2021 in [facility]  Total Score (max 30 points ) 29      PHQ2-9    Flowsheet Row Video Visit from 05/22/2022 in [place] Video Visit from 03/25/2022 in [facility] Counselor from 03/05/2022 in [facility] Office Visit from 02/26/2022 in [place] Video Visit from 01/24/2022 in [place]  PHQ-2 Total Score 1 0 6 0 1  PHQ-9 Total Score -- -- 18 -- --      Flowsheet Row Counselor from 03/05/2022 in [facility] Video Visit from 12/19/2021 in [place] [facility] [place] Video Visit from 02/26/2021 in [facility] No Risk No Risk No Risk        Assessment and Plan: Mary Koch is a 51 year old Caucasian female who has a history of MDD, panic attacks, tobacco use disorder, caffeine use disorder, insomnia was evaluated by telemedicine today.  Patient is currently stable on medications however does have negative self  image-chronic likely due to her being overweight and will definitely need continued psychotherapy sessions.  Patient advised to have more frequent CBT.  Plan MDD in remission Cymbalta 60 mg p.o. daily. Wellbutrin SR 200 mg p.o. daily in the morning Rexulti 0.25 mg p.o. daily-long-term plan is to taper off. Continue CBT  Panic disorder-stable Gabapentin 900 mg p.o. daily in divided dosage-per neurology Cymbalta 60 mg p.o. daily  Agoraphobia-stable Continue CBT  Tobacco use disorder-improving Will monitor closely  Caffeine use disorder-improving Will monitor closely  Follow-up in clinic in 3 months or sooner if needed.  Collaboration of Care: Collaboration of Care: Referral or follow-up with counselor/therapist AEB encouraged to have more frequent psychotherapy sessions.  Patient/Guardian was advised Release of Information must be obtained prior to any record release in order to collaborate their care with an outside provider. Patient/Guardian was advised if they have not already done so to contact the registration department to sign all necessary forms in order for [place] to release information regarding their care.   Consent: Patient/Guardian gives verbal consent for treatment and assignment of benefits for services provided during this visit. Patient/Guardian expressed understanding and agreed to proceed.   This note was generated in part or whole with voice recognition software. Voice recognition is usually quite accurate but there are transcription errors that can and very often do occur. I apologize for any typographical errors that were not detected and corrected.      Ursula Alert, MD 05/22/2022, 1:37 PM

## 2022-06-03 ENCOUNTER — Ambulatory Visit (INDEPENDENT_AMBULATORY_CARE_PROVIDER_SITE_OTHER): Payer: Medicare HMO | Admitting: Licensed Clinical Social Worker

## 2022-06-03 DIAGNOSIS — F4000 Agoraphobia, unspecified: Secondary | ICD-10-CM

## 2022-06-03 DIAGNOSIS — F3342 Major depressive disorder, recurrent, in full remission: Secondary | ICD-10-CM

## 2022-06-03 NOTE — Progress Notes (Signed)
Virtual Visit via Video Note  I connected with [name] on 06/03/22 at  4:00 PM EDT by a video enabled telemedicine application and verified that I am speaking with the correct person using two identifiers.  Location: Patient: home Provider: remote office [place], [place])   I discussed the limitations of evaluation and management by telemedicine and the availability of in person appointments. The patient expressed understanding and agreed to proceed.   I discussed the assessment and treatment plan with the patient. The patient was provided an opportunity to ask questions and all were answered. The patient agreed with the plan and demonstrated an understanding of the instructions.   The patient was advised to call back or seek an in-person evaluation if the symptoms worsen or if the condition fails to improve as anticipated.  I provided 35 minutes of non-face-to-face time during this encounter.   Ajanae Virag R Paxson Harrower, LCSW   THERAPIST PROGRESS NOTE  Session Time: 4-435p  Participation Level: Active  Behavioral Response: Neat and Well GroomedAlertAnxious and Depressed  Type of Therapy: Individual Therapy  Treatment Goals addressed:   ProgressTowards Goals: Progressing  Interventions: Motivational Interviewing and Supportive  Summary: Marlies Ligman is a 51 y.o. female who presents with iimproving symptoms related to depression and anxiety. Pt reports that she is compliant with her medication and is managing symptoms well.   Allowed pt to explore and express thoughts and feelings associated with recent life situations and external stressors. Patient reports that she is still only leaving her home one to two times per month. Patient states she leaves her house to buy dog food and she leaves her house to go to the grocery store. Allow patient to identify why she is fearful of leaving her home. Patient reports that she feels that other people are staring at her or "looking down  on her". Patient was not able to identify specific situations where she has felt that way recently. Patient reports that not leaving her home is a personal preference because she doesn't like being around people, and doesn't feel like it is impacting her life in the negative way.   Patient reports that she does have incidents of depression and experiences the following symptoms:  not wanting to get out of bed, isolating herself from others including family members, and crying episodes. Reviewed strategies to help patient manage mood related symptoms, including leaving the house. Patient reflects understanding.    Continued recommendations are as follows: self care behaviors, positive social engagements, focusing on overall work/home/life balance, and focusing on positive physical and emotional wellness.    Suicidal/Homicidal: No  Therapist Response: Pt is continuing to apply interventions learned in session into daily life situations. Pt is currently on track to meet goals utilizing interventions mentioned above. Personal growth and progress noted. Treatment to continue as indicated.   Plan: Return again in 4 weeks.  Diagnosis: MDD (major depressive disorder), recurrent, in full remission ([place])  Agoraphobia  Collaboration of Care: Other pt encouraged to continue care with psychiatrist of record, Dr. Ursula Alert  Patient/Guardian was advised Release of Information must be obtained prior to any record release in order to collaborate their care with an outside provider. Patient/Guardian was advised if they have not already done so to contact the registration department to sign all necessary forms in order for [place] to release information regarding their care.   Consent: Patient/Guardian gives verbal consent for treatment and assignment of benefits for services provided during this visit. Patient/Guardian expressed understanding  and agreed to proceed.   [place], LCSW 06/04/2022

## 2022-06-05 ENCOUNTER — Other Ambulatory Visit: Payer: Self-pay | Admitting: Certified Nurse Midwife

## 2022-06-05 DIAGNOSIS — N921 Excessive and frequent menstruation with irregular cycle: Secondary | ICD-10-CM

## 2022-06-06 ENCOUNTER — Other Ambulatory Visit: Payer: Self-pay

## 2022-06-06 ENCOUNTER — Other Ambulatory Visit (HOSPITAL_COMMUNITY)
Admission: RE | Admit: 2022-06-06 | Discharge: 2022-06-06 | Disposition: A | Discharge status: Home / Self Care | Payer: Medicare HMO | Source: Ambulatory Visit | Attending: Obstetrics | Admitting: Obstetrics

## 2022-06-06 ENCOUNTER — Ambulatory Visit (INDEPENDENT_AMBULATORY_CARE_PROVIDER_SITE_OTHER): Payer: Medicare HMO | Admitting: Obstetrics

## 2022-06-06 ENCOUNTER — Encounter: Payer: Self-pay | Admitting: Obstetrics

## 2022-06-06 VITALS — BP 150/90 | HR 100 | Ht 66.0 in | Wt 250.2 lb

## 2022-06-06 DIAGNOSIS — Z01419 Encounter for gynecological examination (general) (routine) without abnormal findings: Secondary | ICD-10-CM | POA: Diagnosis not present

## 2022-06-06 DIAGNOSIS — Z1231 Encounter for screening mammogram for malignant neoplasm of breast: Secondary | ICD-10-CM

## 2022-06-06 DIAGNOSIS — Z1151 Encounter for screening for human papillomavirus (HPV): Secondary | ICD-10-CM | POA: Diagnosis not present

## 2022-06-06 DIAGNOSIS — R32 Unspecified urinary incontinence: Secondary | ICD-10-CM

## 2022-06-06 DIAGNOSIS — D229 Melanocytic nevi, unspecified: Secondary | ICD-10-CM | POA: Diagnosis not present

## 2022-06-06 NOTE — Progress Notes (Signed)
SUBJECTIVE  HPI  Mary Koch is a 51 y.o.-year-old female who presents for an annual gynecological exam and Pap smear today. She has not had a period since 2011 when she started getting the Depo injection. She is happy with this method of contraception and would like to continue. She has anxiety and depression and feels it is well managed with medication and therapy. She has occasional hot flashes at night but no other s/s of menopause. She does report that she has a strong odor from her right armpit. She denies pelvic pain, abnormal bleeding or discharge, dyspareunia, and UTI symptoms. She reports urge incontinence.  She is currently sexually active with one partner.   Medical/Surgical History Past Medical History:  Diagnosis Date   Allergic rhinitis    Anemia    Anxiety    Anxiety and depression    Carpal tunnel syndrome    COVID-19 01/15/2020   Depression    Fibroids    GERD (gastroesophageal reflux disease)    Headache(784.0)    Headache(784.0)    Heavy periods    Hyperthyroidism    right portion of thyroid removed   Muscle spasm    Neurological disorder    evaluation for ms   Painful menstrual periods    Shortness of breath    when i smoke a lot   Past Surgical History:  Procedure Laterality Date   BREAST BIOPSY Left    neg   carpel tunnel Left 2017   CESAREAN SECTION     times 2   CHOLECYSTECTOMY     THYROID LOBECTOMY      Social History Lives with 2 sons (age 49 & 33) Work: no Exercise: walking 3x/week Substances: Denies EtOH, vape, and recreational drugs. Smokes 1/2 ppd  Obstetric History OB History     Gravida  2   Para  2   Term  2   Preterm      AB      Living  2      SAB      IAB      Ectopic      Multiple      Live Births  2            GYN/Menstrual History No LMP recorded. Patient has had an injection. Has not menstruated regularly since 2011 (taking Depo) Last Pap: 2018. Normal cytology, negative HPV Contraception:  Depo  Prevention Dentist: sees occasionally Eye exam: no Mammogram: yearly Colonoscopy: declines  Current Medications Outpatient Medications Prior to Visit  Medication Sig   albuterol (PROVENTIL) (2.5 MG/3ML) 0.083% nebulizer solution Take 3 mLs (2.5 mg total) by nebulization every 6 (six) hours as needed for wheezing or shortness of breath.   albuterol (VENTOLIN HFA) 108 (90 Base) MCG/ACT inhaler INHALE 1 TO 2 PUFFS BY MOUTH DAILY   brexpiprazole (REXULTI) 0.25 MG TABS tablet Take 1 tablet (0.25 mg total) by mouth daily.   buPROPion ER (WELLBUTRIN SR) 100 MG 12 hr tablet Take 1 tablet (100 mg total) by mouth daily with breakfast.   cyclobenzaprine (FLEXERIL) 10 MG tablet Take 10 mg by mouth 3 (three) times daily as needed for muscle spasms.   DULoxetine (CYMBALTA) 60 MG capsule Take 1 capsule (60 mg total) by mouth daily.   fluticasone furoate-vilanterol (BREO ELLIPTA) 100-25 MCG/ACT AEPB Inhale 1 puff into the lungs daily.   gabapentin (NEURONTIN) 600 MG tablet Take 1 tablet (600 mg total) by mouth 2 (two) times daily. (Patient taking differently: Take 900 mg  by mouth as directed. 300 mg daily and 600 mg at bedtime)   meclizine (ANTIVERT) 12.5 MG tablet    OZEMPIC, 1 MG/DOSE, 4 MG/3ML SOPN Inject 1 mg into the skin once a week.   rosuvastatin (CRESTOR) 20 MG tablet Take 1 tablet (20 mg total) by mouth daily.   vitamin B-12 (CYANOCOBALAMIN) 1000 MCG tablet Take 1,000 mcg by mouth daily.   Facility-Administered Medications Prior to Visit  Medication Dose Route Frequency Provider   medroxyPROGESTERone (DEPO-PROVERA) injection 150 mg  150 mg Intramuscular Q90 days Rubie Maid, MD      [facility] - 06/06/22 1528       Pregnancy Intention Screening   Does the patient want to become pregnant in the next year? No    Does the patient's partner want to become pregnant in the next year? No    Would the patient like to discuss contraceptive options today? No      Contraception Wrap Up    Current Method Hormonal Injection    Contraception Counseling Provided No             ROS History obtained from the patient General ROS: negative for - chills, fatigue, or fever Psychological ROS: negative for - anxiety or depression Ophthalmic ROS: negative for - decreased vision Hematological and Lymphatic ROS: negative for - bruising or swollen lymph nodes Endocrine ROS: negative for - breast changes, mood swings, polydipsia/polyuria, or unexpected weight changes Breast ROS: negative for breast lumps Respiratory ROS: no cough, shortness of breath, or wheezing Cardiovascular ROS: no chest pain or dyspnea on exertion Gastrointestinal ROS: no abdominal pain, change in bowel habits, or black or bloody stools Genito-Urinary ROS: no dysuria, trouble voiding, or hematuria Musculoskeletal ROS: negative Dermatological ROS: negative     05/22/2022   10:07 AM 03/25/2022   11:43 AM 03/05/2022    9:20 AM 02/26/2022   11:17 AM 01/24/2022   11:36 AM  Depression screen PHQ 2/9  Decreased Interest    0   Down, Depressed, Hopeless    0   PHQ - 2 Score    0   Altered sleeping       Tired, decreased energy       Change in appetite       Feeling bad or failure about yourself        Trouble concentrating       Moving slowly or fidgety/restless       Suicidal thoughts       PHQ-9 Score          Information is confidential and restricted. Go to Review Flowsheets to unlock data.     OBJECTIVE  Last Weight  Most recent update: 06/06/2022  3:27 PM    Weight  113.5 kg (250 lb 3.2 oz)             Body mass index is 40.38 kg/m.    BP (!) 150/90   Pulse 100   Ht '5\' 6"'$  (1.676 m)   Wt 250 lb 3.2 oz (113.5 kg)   BMI 40.38 kg/m  General appearance: alert, cooperative, and appears stated age Head: Normocephalic, without obvious abnormality, atraumatic Eyes: negative findings: lids and lashes normal and conjunctivae and sclerae normal Neck: no adenopathy, supple, symmetrical, trachea  midline, and thyroid not enlarged, symmetric, no tenderness/mass/nodules Lungs: clear to auscultation bilaterally Breasts: normal appearance, no masses or tenderness, Inspection negative, No nipple retraction or dimpling, No nipple discharge or bleeding, No axillary or supraclavicular adenopathy, Normal to palpation  without dominant masses Heart: regular rate and rhythm, S1, S2 normal, no murmur, click, rub or gallop Abdomen: soft, non-tender; bowel sounds normal; no masses,  no organomegaly Pelvic: cervix normal in appearance, external genitalia normal, no cervical motion tenderness, rectovaginal septum normal, and vagina normal without discharge Extremities: extremities normal, atraumatic, no cyanosis or edema Pulses: 2+ and symmetric Skin: Skin color, texture, turgor normal. No rashes or lesions. Some yellowish crust noted in right axilla. Multiple nevi on arms and trunk Lymph nodes: Cervical, supraclavicular, and axillary nodes normal.  ASSESSMENT  1) Annual exam 2) Due for Pap 3) Urinary incontinence  PLAN 1) Physical exam as noted. Declines routine labs and STI testing. Discussed healthy lifestyle choices. Working on smoking cessation with therapist. Recommend calcium supplement d/t long term Depo use. Already has Depo refills ordered. Referral to dermatology for surveillance of nevi. Discussed possible yeast in axilla and treatment/comfort measures. 2) Pap collected. F/u based on results. 3) Referral to pelvic PT  Return in one year for annual exam or as needed for concerns.   Lloyd Huger, CNM

## 2022-06-10 ENCOUNTER — Encounter: Payer: Self-pay | Admitting: Obstetrics

## 2022-06-10 ENCOUNTER — Other Ambulatory Visit: Payer: Self-pay | Admitting: Psychiatry

## 2022-06-10 ENCOUNTER — Ambulatory Visit (INDEPENDENT_AMBULATORY_CARE_PROVIDER_SITE_OTHER): Payer: Medicare HMO | Admitting: Obstetrics

## 2022-06-10 DIAGNOSIS — Z3042 Encounter for surveillance of injectable contraceptive: Secondary | ICD-10-CM

## 2022-06-10 DIAGNOSIS — F410 Panic disorder [episodic paroxysmal anxiety] without agoraphobia: Secondary | ICD-10-CM

## 2022-06-10 LAB — CYTOLOGY - PAP
Adequacy: ABSENT
Comment: NEGATIVE
Diagnosis: NEGATIVE
High risk HPV: NEGATIVE

## 2022-06-10 NOTE — Progress Notes (Signed)
R arm 6/12 Date last pap: 06/06/22. Last Depo-Provera: 03/22/22. Side Effects if any: n/a. Serum HCG indicated? N/a. Depo-Provera 150 mg IM given by: Douglass Rivers, CMA. Next appointment due 08/26/22-09/09/22.

## 2022-06-14 DIAGNOSIS — E119 Type 2 diabetes mellitus without complications: Secondary | ICD-10-CM | POA: Diagnosis not present

## 2022-06-27 ENCOUNTER — Other Ambulatory Visit: Payer: Self-pay | Admitting: Psychiatry

## 2022-06-27 ENCOUNTER — Other Ambulatory Visit: Payer: Self-pay | Admitting: Internal Medicine

## 2022-06-27 DIAGNOSIS — F410 Panic disorder [episodic paroxysmal anxiety] without agoraphobia: Secondary | ICD-10-CM

## 2022-07-04 ENCOUNTER — Other Ambulatory Visit: Payer: Self-pay | Admitting: Psychiatry

## 2022-07-04 DIAGNOSIS — F410 Panic disorder [episodic paroxysmal anxiety] without agoraphobia: Secondary | ICD-10-CM

## 2022-07-08 ENCOUNTER — Ambulatory Visit: Payer: Medicare HMO

## 2022-07-09 ENCOUNTER — Ambulatory Visit: Payer: Medicare HMO | Attending: Obstetrics

## 2022-07-09 DIAGNOSIS — M6289 Other specified disorders of muscle: Secondary | ICD-10-CM | POA: Diagnosis not present

## 2022-07-09 DIAGNOSIS — M6281 Muscle weakness (generalized): Secondary | ICD-10-CM | POA: Diagnosis not present

## 2022-07-09 DIAGNOSIS — R32 Unspecified urinary incontinence: Secondary | ICD-10-CM | POA: Insufficient documentation

## 2022-07-09 DIAGNOSIS — R278 Other lack of coordination: Secondary | ICD-10-CM | POA: Diagnosis not present

## 2022-07-09 NOTE — Therapy (Signed)
OUTPATIENT PHYSICAL THERAPY FEMALE PELVIC EVALUATION   Patient Name: Mary Koch MRN: [id number] DOB:08/14/1971, 51 y.o., female Today's Date: 07/10/2022   PT End of Session - 07/09/22 1108     Visit Number 1    Number of Visits 12    Date for PT Re-Evaluation 10/01/22    Authorization Type IE: 07/09/22    PT Start Time 1105    PT Stop Time 1147    PT Time Calculation (min) 42 min    Activity Tolerance Patient tolerated treatment well             Past Medical History:  Diagnosis Date   Allergic rhinitis    Anemia    Anxiety    Anxiety and depression    Carpal tunnel syndrome    COVID-19 01/15/2020   Depression    Fibroids    GERD (gastroesophageal reflux disease)    Headache(784.0)    Headache(784.0)    Heavy periods    Hyperthyroidism    right portion of thyroid removed   Muscle spasm    Neurological disorder    evaluation for ms   Painful menstrual periods    Shortness of breath    when i smoke a lot   Past Surgical History:  Procedure Laterality Date   BREAST BIOPSY Left    neg   carpel tunnel Left 2017   CESAREAN SECTION     times 2   CHOLECYSTECTOMY     THYROID LOBECTOMY     Patient Active Problem List   Diagnosis Date Noted   Hot flashes 03/29/2022   Shortness of breath 09/25/2021   Severe episode of recurrent major depressive disorder, without psychotic features ([place]) 09/13/2021   EKG abnormalities 09/10/2021   Insomnia due to medical condition 09/05/2021   At risk for prolonged QT interval syndrome 09/05/2021   Prediabetes 03/30/2021   Chronic pain of left knee 03/30/2021   MDD (major depressive disorder), recurrent episode, moderate ([place]) 11/13/2020   Essential hypertension 10/23/2020   Other specified diabetes mellitus with other specified complication ([place]) 75/09/2584   Recurrent major depressive disorder, in full remission ([place]) 07/25/2020   Tick bite 07/10/2020   Blurred vision 07/10/2020   Lethargy 07/10/2020   High risk  medication use 04/06/2020   Panic disorder 07/28/2019   Bereavement 07/28/2019   MDD (major depressive disorder), recurrent episode, with atypical features ([place]) 07/28/2019   Agoraphobia 07/28/2019   Insomnia due to other mental disorder 07/28/2019   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   Tobacco use disorder 12/16/2017   Anxiety 06/25/2015   Carpal tunnel syndrome 06/25/2015   Family planning 06/25/2015   Bloodgood disease 06/25/2015   Fibroid 06/25/2015   Acid reflux 06/25/2015   Excess, menstruation 06/25/2015   Increased BMI 06/25/2015   Class 3 severe obesity due to excess calories with serious comorbidity and body mass index (BMI) of 40.0 to 44.9 in adult [facility]) 06/25/2015   Dysmenorrhea 06/25/2015   Disease of thyroid gland 06/25/2015   Compulsive tobacco user syndrome 06/25/2015   Episodic tension type headache 08/23/2014   Allergic rhinitis 07/23/2013   Wheezing 07/23/2013   Lower urinary tract infectious disease 03/11/2013   Hematuria 03/11/2013   D (diarrhea) 01/13/2013   Vomiting 01/13/2013   Amenorrhea 12/22/2012   HLD (hyperlipidemia) 08/31/2011    PCP: Cletis Athens, MD  REFERRING PROVIDER: Lurlean Horns, CNM   REFERRING DIAG:  R32 (ICD-10-CM) - Urinary incontinence in female   THERAPY DIAG:  Other lack of coordination  Muscle weakness (generalized)  Pelvic floor dysfunction  Rationale for Evaluation and Treatment: Rehabilitation  ONSET DATE: About 13 years   RED FLAGS: N/A Have you had any night sweats? Unexplained weight loss? Saddle anesthesia? Unexplained changes in bowel or bladder habits?   SUBJECTIVE: Patient confirms identification and approves PT to assess pelvic floor and treatment Yes                                                                                                                                                                                           PRECAUTIONS: None  WEIGHT BEARING RESTRICTIONS: No  FALLS:  Has  patient fallen in last 6 months? No  OCCUPATION/SOCIAL ACTIVITIES: On disability, enjoys walking  PLOF: Independent  PERTINENT HISTORY/CHART REVIEW:   CHIEF CONCERN: Pt reports her Dr. referred her here, but did not know what to expect with PFPT. Pt is having urinary leakage with sit<>stand transfers, laughing, coughing, and squatting. The leakage has become worse over the past year even though she has had urinary leakage since her last born child (34 years old).    PAIN:  Are you having pain? No   LIVING ENVIRONMENT: Lives with: lives with their family Lives in: Mobile home   PATIENT GOALS: Pt wants to be able to not have any leakage where she needs to change and the ability to not use the bathroom as much.     UROLOGICAL HISTORY Fluid intake: Yes: water (3 12 oz), mountain dew, diet mountain dew (6 12oz or bigger), will mix the two (diet and regular)   Pain with urination: No Fully empty bladder: No Stream: constant and strong  Toileting posture: feet flat  Urgency: Yes:   Frequency: 12x/day Nocturia: 4x  Leakage: Urge to void, Walking to the bathroom, Coughing, Sneezing, Laughing, Lifting, and Bending forward Pads: No, will change and take a shower, 2x per day Bladder control (0-10): 4/10  GASTROINTESTINAL HISTORY Pt has no concerns    SEXUAL HISTORY/FUNCTION Pt has no concerns    OBSTETRICAL HISTORY Vaginal deliveries: G2P2 Tearing: No C-section deliveries: both c-section  Currently pregnant: No  GYNECOLOGICAL HISTORY Hysterectomy: no Pelvic Organ Prolapse: None Heaviness/pressure: no   OBJECTIVE:   DIAGNOSTIC TESTING/IMPRESSIONS:    COGNITION: Overall cognitive status: Within functional limits for tasks assessed     POSTURE:  L shoulder highger, decreased lumbar lordosis in standing and sitting, posterior pelvic tilt in sitting    SENSATION: Deferred 2/2 time constraints  Light touch: , L2-S2 dermatomes  Proprioception:    RANGE OF  MOTION:  Deferred the rest 2/2 time constraints   (Norm range in degrees)  LEFT 07/09/22  RIGHT 07/09/22  Lumbar forward flexion (65):  WNL    Lumbar extension (30): WNL    Lumbar lateral flexion (25):  WNL* WNL*  Thoracic and Lumbar rotation (30 degrees):    WNL WNL  Hip Flexion (0-125):      Hip IR (0-45):     Hip ER (0-45):     Hip Adduction:      Hip Abduction (0-40):     Hip extension (0-15):     (*= pain, Blank rows = not tested)   STRENGTH: MMT   RLE 07/09/22 LLE 07/09/22  Hip Flexion 5 4  Hip Extension 5 5  Hip Abduction     Hip Adduction     Hip ER  4 4  Hip IR  4 4  Knee Extension 5 5  Knee Flexion 4 4  Dorsiflexion     Plantarflexion (seated) 5 5  (*= pain, Blank rows = not tested)   SPECIAL TESTS: Deferred 2/2 time constraints  Centralization and Peripheralization (SN 92, -LR 0.12):  Slump (SN 83, -LR 0.32): R:  L:  SLR (SN 92, -LR 0.29): R:  L:   Lumbar quadrant (SN 70): R:  L:  FABER (SN 81): R:  L:  FADIR (SN 94): R:  L:  Hip scour (SN 50): R:  L:  Thigh Thrust (SN 88, -LR 0.18) : R:  L:  Distraction (NU27): R:  L:  Compression (SN/SP 69): R:  L:  Stork/March (SP 93): R:  L:    PALPATION: Deferred 2/2 time constraints  Abdominal:  Diastasis:  finger above umbilicus,  fingers at and below umbilicus  Scar mobility: present/mobile perpendicular, parallel Rib flare: present/absent  EXTERNAL PELVIC EXAM: Patient educated on the purpose of the pelvic exam and articulated understanding; patient consented to the exam verbally. Deferred 2/2 time constraints  Palpation: Breath coordination: present/absent/inconsistent Voluntary Contraction: present/absent Relaxation: full/delayed/non-relaxing Perineal movement with sustained IAP increase ("bear down"): descent/no change/elevation/excessive descent Perineal movement with rapid IAP increase ("cough"): elevation/no change/descent Pubic symphysis: (0= no contraction, 1= flicker, 2= weak squeeze, 3= fair  squeeze with lift, 4= good squeeze and lift against resistance, 5= strong squeeze against strong resistance)   INTERNAL PELVIC EXAM: Patient educated on the purpose of the pelvic exam and articulated understanding; patient consented to the exam verbally. Deferred 2/2 to time constraints Introitus Appears:  Skin integrity:  Scar mobility: Strength (PERF):  Symmetry: Palpation: Prolapse: (0= no contraction, 1= flicker, 2= weak squeeze, 3= fair squeeze with lift, 4= good squeeze and lift against resistance, 5= strong squeeze against strong resistance)    Patient Education:  Patient educated on what to expect during course of physical therapy, POC, and provided with HEP including: bladder irritants and toileting posture handout. Patient verbalized understanding and returned demonstration. Pt provided with a bladder diary to complete for next session. Patient will benefit from further education in order to maximize compliance and understanding for long-term therapeutic gains.   Patient Surveys:  FOTO Urinary Problem - 50     ASSESSMENT:  Clinical Impression: Patient is a 51 y.o. who was seen today for physical therapy evaluation and treatment for a chief concern of urinary leakage. Today's evaluation suggest deficits in IAP management, PFM coordination, PFM endurance, posture, sleep hygiene, and scar mobility, as evidenced by urinary leakage with STS transfers/walking to the bathroom/coughing/laughing/squatting, having to change clothes 2x/day due to leakage, nocturia (4x per night), urinary urgency (12x/day), decreased lumbar lordosis in sitting/standing (posterior pelvic tilt in sitting), increased ingestion of  bladder irritants, and hx of two c-sections. Upon brief physical assessment, Pt also demonstrates deficits in LE strength and pain as evidenced by pain in the low back with B lateral flexion ROM and hip IR/ER and knee extension weakness B. Will finish physical assessment next visit.  Patient's responses on FOTO Urinary Problem (50) indicates moderate limitation/disability/distress. Patient's progress may be limited due to time since onset; however, patient's motivation is advantageous. Pt with basic understanding of PFM function in bowel/bladder habits, posture, the deep core, and sexual function. Patient will benefit from skilled therapeutic intervention to address deficits in IAP management, PFM coordination, PFM endurance, posture, sleep hygiene, scar mobility, LE strength, and pain in order to increase PLOF and improve overall QOL.    Objective Impairments: decreased coordination, decreased endurance, decreased strength, increased fascial restrictions, improper body mechanics, postural dysfunction, and pain.   Activity Limitations: lifting, bending, sitting, standing, squatting, sleeping, continence, and locomotion level  Personal Factors: Age, Behavior pattern, Past/current experiences, Time since onset of injury/illness/exacerbation, and 1-2 comorbidities: hyperthyroidism and anxiety/depression  are also affecting patient's functional outcome.   Rehab Potential: Good  Clinical Decision Making: Evolving/moderate complexity  Evaluation Complexity: Moderate   GOALS: Goals reviewed with patient? Yes  SHORT TERM GOALS: Target date: 08/21/2022   Patient will decrease intake of caffeinated beverages and other bladder irritants to less than/= 3 cups in order to manage urinary frequency and bladder irritation for improved overall QOL and participation at home and in the community. Baseline: mountain dew, diet mountain dew (6 12oz or bigger), will mix the two (diet and regular)  Goal status: INITIAL    LONG TERM GOALS: Target date: 10/02/2022   Patient will report confidence in ability to control bladder > 7/10 in order to demonstrate improved function and ability to participate more fully in activities at home and in the community. Baseline: 4/10 Goal status:  INITIAL  2.  Patient will report less than 5 incidents of stress urinary incontinence over the course of 3 weeks while coughing/sneezing/laughing/STS transfer/squatting in order to demonstrate improved PFM coordination, strength, and function for improved overall QOL. Baseline: leakage every time with any of the above  Goal status: INITIAL  3.  Patient will report a decrease in voiding intervals, matching age-related norms, during the night in order to demonstrate improved control of the bladder, PFM coordination, sleep quality, and overall QOL.  Baseline: 4x/night Goal status: INITIAL  4.  Patient will report decreased number of changing clothes, due to leakage, as indicated by a 24 hour period to demonstrate improved bladder control and allow for increased participation in activities outside of the home without limitation or disruption. Baseline: 2x/day  Goal status: INITIAL  5.  Patient will demonstrate independent and coordinated diaphragmatic breathing in supine with a 1:2 breathing pattern for improved down-regulation of the nervous system and improved management of intra-abdominal pressures in order to increase function at home and in the community. Baseline: will assess next visit  Goal status: INITIAL  6.  Patient will score >/= 57 on FOTO Urinary Problem  in order to demonstrate  improved PFM coordination, improved IAP management, and overall improved QOL.  Baseline: 50 Goal status: INITIAL  PLAN: PT Frequency: 1x/week  PT Duration: 12 weeks  Planned Interventions: Therapeutic exercises, Therapeutic activity, Neuromuscular re-education, Balance training, Gait training, Patient/Family education, Joint mobilization, Spinal mobilization, Cryotherapy, Moist heat, scar mobilization, Taping, and Manual therapy  Plan For Next Session: finish phy assess, go over bladder diary, breath/start deep core?  Samad Thon, PT, DPT  07/10/2022, 8:01 AM

## 2022-07-11 ENCOUNTER — Ambulatory Visit: Payer: Medicare HMO | Admitting: Nurse Practitioner

## 2022-07-15 ENCOUNTER — Ambulatory Visit: Payer: Medicare HMO

## 2022-07-22 ENCOUNTER — Ambulatory Visit: Payer: Medicare HMO

## 2022-07-23 ENCOUNTER — Other Ambulatory Visit: Payer: Self-pay | Admitting: Certified Nurse Midwife

## 2022-07-23 ENCOUNTER — Other Ambulatory Visit: Payer: Self-pay | Admitting: Internal Medicine

## 2022-07-23 DIAGNOSIS — N921 Excessive and frequent menstruation with irregular cycle: Secondary | ICD-10-CM

## 2022-07-29 ENCOUNTER — Ambulatory Visit: Payer: Medicare HMO

## 2022-07-29 DIAGNOSIS — M6289 Other specified disorders of muscle: Secondary | ICD-10-CM | POA: Diagnosis not present

## 2022-07-29 DIAGNOSIS — R278 Other lack of coordination: Secondary | ICD-10-CM

## 2022-07-29 DIAGNOSIS — M6281 Muscle weakness (generalized): Secondary | ICD-10-CM

## 2022-07-29 DIAGNOSIS — R32 Unspecified urinary incontinence: Secondary | ICD-10-CM | POA: Diagnosis not present

## 2022-07-29 NOTE — Therapy (Signed)
OUTPATIENT PHYSICAL THERAPY FEMALE PELVIC TREATMENT   Patient Name: Mary Koch MRN: [id number] DOB:1971-01-24, 51 y.o., female Today's Date: 07/29/2022   PT End of Session - 07/29/22 1446     Visit Number 2    Number of Visits 12    Date for PT Re-Evaluation 10/01/22    PT Start Time 1445    PT Stop Time 1525    PT Time Calculation (min) 40 min    Activity Tolerance Patient tolerated treatment well             Past Medical History:  Diagnosis Date   Allergic rhinitis    Anemia    Anxiety    Anxiety and depression    Carpal tunnel syndrome    COVID-19 01/15/2020   Depression    Fibroids    GERD (gastroesophageal reflux disease)    Headache(784.0)    Headache(784.0)    Heavy periods    Hyperthyroidism    right portion of thyroid removed   Muscle spasm    Neurological disorder    evaluation for ms   Painful menstrual periods    Shortness of breath    when i smoke a lot   Past Surgical History:  Procedure Laterality Date   BREAST BIOPSY Left    neg   carpel tunnel Left 2017   CESAREAN SECTION     times 2   CHOLECYSTECTOMY     THYROID LOBECTOMY     Patient Active Problem List   Diagnosis Date Noted   Hot flashes 03/29/2022   Shortness of breath 09/25/2021   Severe episode of recurrent major depressive disorder, without psychotic features ([place]) 09/13/2021   EKG abnormalities 09/10/2021   Insomnia due to medical condition 09/05/2021   At risk for prolonged QT interval syndrome 09/05/2021   Prediabetes 03/30/2021   Chronic pain of left knee 03/30/2021   MDD (major depressive disorder), recurrent episode, moderate ([place]) 11/13/2020   Essential hypertension 10/23/2020   Other specified diabetes mellitus with other specified complication ([place]) 93/81/8299   Recurrent major depressive disorder, in full remission ([place]) 07/25/2020   Tick bite 07/10/2020   Blurred vision 07/10/2020   Lethargy 07/10/2020   High risk medication use 04/06/2020   Panic  disorder 07/28/2019   Bereavement 07/28/2019   MDD (major depressive disorder), recurrent episode, with atypical features ([place]) 07/28/2019   Agoraphobia 07/28/2019   Insomnia due to other mental disorder 07/28/2019   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   Tobacco use disorder 12/16/2017   Anxiety 06/25/2015   Carpal tunnel syndrome 06/25/2015   Family planning 06/25/2015   Bloodgood disease 06/25/2015   Fibroid 06/25/2015   Acid reflux 06/25/2015   Excess, menstruation 06/25/2015   Increased BMI 06/25/2015   Class 3 severe obesity due to excess calories with serious comorbidity and body mass index (BMI) of 40.0 to 44.9 in adult [facility]) 06/25/2015   Dysmenorrhea 06/25/2015   Disease of thyroid gland 06/25/2015   Compulsive tobacco user syndrome 06/25/2015   Episodic tension type headache 08/23/2014   Allergic rhinitis 07/23/2013   Wheezing 07/23/2013   Lower urinary tract infectious disease 03/11/2013   Hematuria 03/11/2013   D (diarrhea) 01/13/2013   Vomiting 01/13/2013   Amenorrhea 12/22/2012   HLD (hyperlipidemia) 08/31/2011    PCP: Cletis Athens, MD  REFERRING PROVIDER: Lurlean Horns, CNM   REFERRING DIAG:  R32 (ICD-10-CM) - Urinary incontinence in female   THERAPY DIAG:  Other lack of coordination  Muscle weakness (generalized)  Pelvic floor  dysfunction  Rationale for Evaluation and Treatment: Rehabilitation  ONSET DATE: About 13 years                                                                                                                                                                                       PRECAUTIONS: None  WEIGHT BEARING RESTRICTIONS: No  FALLS:  Has patient fallen in last 6 months? No  OCCUPATION/SOCIAL ACTIVITIES: On disability, enjoys walking  PLOF: Independent   CHIEF CONCERN: Pt reports her Dr. referred her here, but did not know what to expect with PFPT. Pt is having urinary leakage with sit<>stand transfers, laughing,  coughing, and squatting. The leakage has become worse over the past year even though she has had urinary leakage since her last born child (51 years old).   LIVING ENVIRONMENT: Lives with: lives with their family Lives in: Mobile home   PATIENT GOALS: Pt wants to be able to not have any leakage where she needs to change and the ability to not use the bathroom as much.     UROLOGICAL HISTORY Fluid intake: Yes: water (3 12 oz), mountain dew, diet mountain dew (6 12oz or bigger), will mix the two (diet and regular)   Pain with urination: No Fully empty bladder: No Stream: constant and strong  Toileting posture: feet flat  Urgency: Yes:   Frequency: 12x/day Nocturia: 4x  Leakage: Urge to void, Walking to the bathroom, Coughing, Sneezing, Laughing, Lifting, and Bending forward Pads: No, will change and take a shower, 2x per day Bladder control (0-10): 4/10  GASTROINTESTINAL HISTORY Pt has no concerns    SEXUAL HISTORY/FUNCTION Pt has no concerns    OBSTETRICAL HISTORY Vaginal deliveries: G2P2 Tearing: No C-section deliveries: both c-section  Currently pregnant: No  GYNECOLOGICAL HISTORY Hysterectomy: no Pelvic Organ Prolapse: None Heaviness/pressure: no  SUBJECTIVE:  Pt has no significant changes since initial evaluation (2 weeks ago).   PAIN:  Are you having pain? No   TODAY'S TREATMENT:   Pre-treatment assessment   OBJECTIVE:   COGNITION: Overall cognitive status: Within functional limits for tasks assessed     POSTURE:  L shoulder higher, decreased lumbar lordosis in standing and sitting, posterior pelvic tilt in sitting     RANGE OF MOTION:    (Norm range in degrees)  LEFT 07/09/22 RIGHT 07/09/22  Lumbar forward flexion (65):  WNL    Lumbar extension (30): WNL    Lumbar lateral flexion (25):  WNL* WNL*  Thoracic and Lumbar rotation (30 degrees):    WNL WNL  Hip Flexion (0-125):   WNL WNL  Hip IR (0-45):  Restricted WNL  Hip ER (0-45):  WNL  WNL*   Hip Adduction:      Hip Abduction (0-40):  WNL WNL  Hip extension (0-15):     (*= pain, Blank rows = not tested)   STRENGTH: MMT   RLE 07/09/22 LLE 07/09/22  Hip Flexion 5 4  Hip Extension 5 5  Hip Abduction     Hip Adduction     Hip ER  4 4  Hip IR  4 4  Knee Extension 5 5  Knee Flexion 4 4  Dorsiflexion     Plantarflexion (seated) 5 5  (*= pain, Blank rows = not tested)   SPECIAL TESTS:  FABER (SN 81): positive on R, at inner groin FADIR (SN 94): negative B  PALPATION:  Abdominal:  Diastasis: none Scar mobility: present, 2 c-sections, restrictions noted above the scar and at with increased sharp/cutting feeling on the L side of the scar   Present at RUQ from gallbladder removal, 3 small scars, tension noted upon palpation    Palpation across abdomen: tenderness and discomfort at the center of abdomen and RLQ/LLQ, significant tension noted   EXTERNAL PELVIC EXAM: Patient educated on the purpose of the pelvic exam and articulated understanding; patient consented to the exam verbally. Deferred 2/2 time constraints  Palpation: Breath coordination: present/absent/inconsistent Voluntary Contraction: present/absent Relaxation: full/delayed/non-relaxing Perineal movement with sustained IAP increase ("bear down"): descent/no change/elevation/excessive descent Perineal movement with rapid IAP increase ("cough"): elevation/no change/descent Pubic symphysis: (0= no contraction, 1= flicker, 2= weak squeeze, 3= fair squeeze with lift, 4= good squeeze and lift against resistance, 5= strong squeeze against strong resistance)    Manual Therapy: Myofascial abdominal release across the abdomen for improved tissue extensibility  Significant tension upon palpation, towards the center of abdomen and RLQ/LLQ  Improved tension with increased time   Scar mobilization at c-section scar for improve scar restriction/extensibility Pt described feeling of cutting/sharp towards the L side of  the scar which improved with continued intervention  Handout given on scar massage techniques    Neuromuscular Re-education: Supine hooklying diaphragmatic breathing with VCs and TCs for downregulation of the nervous system and improved management of IAP   Patient response to interventions: Pt felt less "tight" in the abdomen at end of session, which she didn't realize before.    Patient Education:  Patientprovided with HEP including: scar massage handout and supine diaphragmatic breathing. Patient verbalized understanding and returned demonstration. Patient educated throughout session on appropriate technique and form using multi-modal cueing, HEP, and activity modification. Patient will benefit from further education in order to maximize compliance and understanding for long-term therapeutic gains.    ASSESSMENT:  Clinical Impression: Patient with excellent motivation to participate in today's session. Upon physical examination, Pt demonstrates deficits in IAP management, PFM coordination, PFM strength, posture, pain, muscular tension, and scar mobility, as evidenced by decreased lumbar lordosis in sitting/standing (posterior pelvic tilt in sitting), pain in the low back with B lateral flexion ROM, pain with R hip ER ROM, restricted L hip IR ROM, + FABER on R, significant scar restriction at c-section scar, and significant abdominal tension throughout (center, RLQ/LLQ). After manual intervention, Pt with improved tension in the abdominal cavity and felt a relief in the abdomen at end of the session. Pt required moderate VCs and TCs for diaphragmatic breathing during manual and active interventions. Pt responded well to all active, manual, and educational interventions. Patient will benefit from skilled therapeutic intervention to address deficits in IAP management, PFM coordination, PFM strength, posture, pain, muscular tension, and scar mobility  in order to increase PLOF and improve overall  QOL.    Objective Impairments: decreased coordination, decreased endurance, decreased strength, increased fascial restrictions, improper body mechanics, postural dysfunction, and pain.   Activity Limitations: lifting, bending, sitting, standing, squatting, sleeping, continence, and locomotion level  Personal Factors: Age, Behavior pattern, Past/current experiences, Time since onset of injury/illness/exacerbation, and 1-2 comorbidities: hyperthyroidism and anxiety/depression  are also affecting patient's functional outcome.   Rehab Potential: Good  Clinical Decision Making: Evolving/moderate complexity  Evaluation Complexity: Moderate   GOALS: Goals reviewed with patient? Yes  SHORT TERM GOALS: Target date: 09/09/2022   Patient will decrease intake of caffeinated beverages and other bladder irritants to less than/= 3 cups in order to manage urinary frequency and bladder irritation for improved overall QOL and participation at home and in the community. Baseline: mountain dew, diet mountain dew (6 12oz or bigger), will mix the two (diet and regular)  Goal status: INITIAL    LONG TERM GOALS: Target date: 10/21/2022   Patient will report confidence in ability to control bladder > 7/10 in order to demonstrate improved function and ability to participate more fully in activities at home and in the community. Baseline: 4/10 Goal status: INITIAL  2.  Patient will report less than 5 incidents of stress urinary incontinence over the course of 3 weeks while coughing/sneezing/laughing/STS transfer/squatting in order to demonstrate improved PFM coordination, strength, and function for improved overall QOL. Baseline: leakage every time with any of the above  Goal status: INITIAL  3.  Patient will report a decrease in voiding intervals, matching age-related norms, during the night in order to demonstrate improved control of the bladder, PFM coordination, sleep quality, and overall QOL.   Baseline: 4x/night Goal status: INITIAL  4.  Patient will report decreased number of changing clothes, due to leakage, as indicated by a 24 hour period to demonstrate improved bladder control and allow for increased participation in activities outside of the home without limitation or disruption. Baseline: 2x/day  Goal status: INITIAL  5.  Patient will demonstrate independent and coordinated diaphragmatic breathing in supine with a 1:2 breathing pattern for improved down-regulation of the nervous system and improved management of intra-abdominal pressures in order to increase function at home and in the community. Baseline: will assess next visit  Goal status: INITIAL  6.  Patient will score >/= 57 on FOTO Urinary Problem  in order to demonstrate  improved PFM coordination, improved IAP management, and overall improved QOL.  Baseline: 50 Goal status: INITIAL  PLAN: PT Frequency: 1x/week  PT Duration: 12 weeks  Planned Interventions: Therapeutic exercises, Therapeutic activity, Neuromuscular re-education, Balance training, Gait training, Patient/Family education, Joint mobilization, Spinal mobilization, Cryotherapy, Moist heat, scar mobilization, Taping, and Manual therapy  Plan For Next Session: PFM external, how was scar massage? Start deep core?   [employer], PT, DPT  07/29/2022, 2:47 PM

## 2022-07-31 ENCOUNTER — Other Ambulatory Visit: Payer: Self-pay | Admitting: Internal Medicine

## 2022-07-31 ENCOUNTER — Other Ambulatory Visit: Payer: Self-pay | Admitting: Psychiatry

## 2022-07-31 DIAGNOSIS — F410 Panic disorder [episodic paroxysmal anxiety] without agoraphobia: Secondary | ICD-10-CM

## 2022-07-31 DIAGNOSIS — F3342 Major depressive disorder, recurrent, in full remission: Secondary | ICD-10-CM

## 2022-08-02 ENCOUNTER — Ambulatory Visit (INDEPENDENT_AMBULATORY_CARE_PROVIDER_SITE_OTHER): Payer: Medicare HMO | Admitting: Nurse Practitioner

## 2022-08-02 ENCOUNTER — Encounter: Payer: Self-pay | Admitting: Nurse Practitioner

## 2022-08-02 VITALS — BP 128/79 | HR 87 | Ht 66.0 in | Wt 240.9 lb

## 2022-08-02 DIAGNOSIS — F3342 Major depressive disorder, recurrent, in full remission: Secondary | ICD-10-CM | POA: Diagnosis not present

## 2022-08-02 DIAGNOSIS — R7303 Prediabetes: Secondary | ICD-10-CM | POA: Diagnosis not present

## 2022-08-02 DIAGNOSIS — I10 Essential (primary) hypertension: Secondary | ICD-10-CM

## 2022-08-02 LAB — POCT GLYCOSYLATED HEMOGLOBIN (HGB A1C): Hemoglobin A1C: 5 % (ref 4.0–5.6)

## 2022-08-02 LAB — GLUCOSE, POCT (MANUAL RESULT ENTRY): POC Glucose: 109 mg/dl — AB (ref 70–99)

## 2022-08-02 MED ORDER — PROPRANOLOL HCL ER 60 MG PO CP24: 60.0000 mg | ORAL_CAPSULE | Freq: Every day | ORAL | 6 refills | Status: DC

## 2022-08-02 NOTE — Assessment & Plan Note (Signed)
Patient is having [place] therapy.

## 2022-08-02 NOTE — Progress Notes (Signed)
Established Patient Office Visit  Subjective:  Patient ID: Mary Koch, female    DOB: 1971/03/27  Age: 51 y.o. MRN: [id number]  CC:  Chief Complaint  Patient presents with   Follow-up     HPI  [facility] presents for routine follow-up.  Patient has history of anxiety depression, hypertension and prediabetes.  Patient is followed by a counselor every 2 months for depression.  She is overall doing fine and no new complaints at present.  HPI   Past Medical History:  Diagnosis Date   Allergic rhinitis    Anemia    Anxiety    Anxiety and depression    Carpal tunnel syndrome    COVID-19 01/15/2020   Depression    Fibroids    GERD (gastroesophageal reflux disease)    Headache(784.0)    Headache(784.0)    Heavy periods    Hyperthyroidism    right portion of thyroid removed   Muscle spasm    Neurological disorder    evaluation for ms   Painful menstrual periods    Shortness of breath    when i smoke a lot    Past Surgical History:  Procedure Laterality Date   BREAST BIOPSY Left    neg   carpel tunnel Left 2017   CESAREAN SECTION     times 2   CHOLECYSTECTOMY     THYROID LOBECTOMY      Family History  Problem Relation Age of Onset   Osteoporosis Mother    Breast cancer Mother 39   Diabetes Father    Anxiety disorder Father    Depression Father    Post-traumatic stress disorder Father    Heart disease Father    Lung cancer Father    Anxiety disorder Sister    Alcohol abuse Brother    Colon cancer Neg Hx    Ovarian cancer Neg Hx     Social History   Socioeconomic History   Marital status: Legally Separated    Spouse name: Not on file   Number of children: 2   Years of education: Not on file   Highest education level: High school graduate  Occupational History   Not on file  Tobacco Use   Smoking status: Every Day    Packs/day: 1.00    Years: 25.00    Total pack years: 25.00    Types: Cigarettes    Start date: 05/22/1986   Smokeless  tobacco: Never  Vaping Use   Vaping Use: Never used  Substance and Sexual Activity   Alcohol use: No    Alcohol/week: 0.0 standard drinks of alcohol   Drug use: No   Sexual activity: Yes    Partners: Male    Birth control/protection: Surgical, Injection  Other Topics Concern   Not on file  Social History Narrative   Not on file   Social Determinants of Health   Financial Resource Strain: Medium Risk (09/27/2021)   Overall Financial Resource Strain (CARDIA)    Difficulty of Paying Living Expenses: Somewhat hard  Food Insecurity: Food Insecurity Present (09/27/2021)   Hunger Vital Sign    Worried About Running Out of Food in the Last Year: Sometimes true    Ran Out of Food in the Last Year: Sometimes true  Transportation Needs: No Transportation Needs (09/27/2021)   PRAPARE - Hydrologist (Medical): No    Lack of Transportation (Non-Medical): No  Physical Activity: Insufficiently Active (09/27/2021)   Exercise Vital Sign  Days of Exercise per Week: 2 days    Minutes of Exercise per Session: 10 min  Stress: Stress Concern Present (09/27/2021)   [place]    Feeling of Stress : Very much  Social Connections: Socially Isolated (09/27/2021)   Social Connection and Isolation Panel [NHANES]    Frequency of Communication with Friends and Family: More than three times a week    Frequency of Social Gatherings with Friends and Family: More than three times a week    Attends Religious Services: Never    Marine scientist or Organizations: No    Attends Archivist Meetings: Never    Marital Status: Separated  Intimate Partner Violence: Not At Risk (09/27/2021)   Humiliation, Afraid, Rape, and Kick questionnaire    Fear of Current or Ex-Partner: No    Emotionally Abused: No    Physically Abused: No    Sexually Abused: No     Outpatient Medications Prior to Visit  Medication  Sig Dispense Refill   albuterol (PROVENTIL) (2.5 MG/3ML) 0.083% nebulizer solution Take 3 mLs (2.5 mg total) by nebulization every 6 (six) hours as needed for wheezing or shortness of breath. 150 mL 1   albuterol (VENTOLIN HFA) 108 (90 Base) MCG/ACT inhaler INHALE 1 TO 2 PUFFS BY MOUTH DAILY 6.7 each 5   BREO ELLIPTA 100-25 MCG/ACT AEPB TAKE 1 PUFF BY MOUTH EVERY DAY 60 each 4   cyclobenzaprine (FLEXERIL) 10 MG tablet Take 10 mg by mouth 3 (three) times daily as needed for muscle spasms.     DULoxetine (CYMBALTA) 60 MG capsule TAKE 1 CAPSULE BY MOUTH EVERY DAY 90 capsule 0   gabapentin (NEURONTIN) 600 MG tablet Take 1 tablet (600 mg total) by mouth 2 (two) times daily. (Patient taking differently: Take 900 mg by mouth as directed. 300 mg daily and 600 mg at bedtime) 180 tablet 1   OZEMPIC, 1 MG/DOSE, 4 MG/3ML SOPN Inject 1 mg into the skin once a week.     rosuvastatin (CRESTOR) 20 MG tablet Take 1 tablet (20 mg total) by mouth daily. 90 tablet 3   brexpiprazole (REXULTI) 0.25 MG TABS tablet Take 1 tablet (0.25 mg total) by mouth daily. (Patient not taking: Reported on 08/02/2022) 90 tablet 1   buPROPion ER (WELLBUTRIN SR) 100 MG 12 hr tablet Take 1 tablet (100 mg total) by mouth daily with breakfast. (Patient not taking: Reported on 08/02/2022) 90 tablet 1   meclizine (ANTIVERT) 12.5 MG tablet  (Patient not taking: Reported on 07/09/2022)     vitamin B-12 (CYANOCOBALAMIN) 1000 MCG tablet Take 1,000 mcg by mouth daily. (Patient not taking: Reported on 07/09/2022)     Facility-Administered Medications Prior to Visit  Medication Dose Route Frequency Provider Last Rate Last Admin   medroxyPROGESTERone (DEPO-PROVERA) injection 150 mg  150 mg Intramuscular Q90 days Rubie Maid, MD   150 mg at 06/10/22 1558    Allergies  Allergen Reactions   Azithromycin Other (See Comments)    headache   Codeine Other (See Comments)    unknown   Penicillins Other (See Comments)    headaches    ROS Review of  Systems  Constitutional: Negative.   HENT: Negative.    Eyes: Negative.   Respiratory:  Negative for chest tightness and shortness of breath.   Cardiovascular: Negative.   Gastrointestinal: Negative.   Genitourinary: Negative.   Musculoskeletal: Negative.   Neurological: Negative.   Psychiatric/Behavioral: Negative.  Objective:    Physical Exam Constitutional:      Appearance: Normal appearance. She is obese.  HENT:     Head: Normocephalic and atraumatic.     Right Ear: Tympanic membrane normal.     Left Ear: Tympanic membrane normal.     Mouth/Throat:     Mouth: Mucous membranes are moist.  Eyes:     Extraocular Movements: Extraocular movements intact.     Conjunctiva/sclera: Conjunctivae normal.     Pupils: Pupils are equal, round, and reactive to light.  Cardiovascular:     Rate and Rhythm: Normal rate and regular rhythm.     Pulses: Normal pulses.     Heart sounds: Normal heart sounds.  Pulmonary:     Effort: Pulmonary effort is normal.     Breath sounds: Normal breath sounds.  Abdominal:     General: Bowel sounds are normal.     Palpations: Abdomen is soft. There is no mass.     Tenderness: There is no abdominal tenderness.  Musculoskeletal:        General: Normal range of motion.  Skin:    General: Skin is warm.  Neurological:     General: No focal deficit present.     Mental Status: She is alert and oriented to person, place, and time. Mental status is at baseline.  Psychiatric:        Mood and Affect: Mood normal.        Behavior: Behavior normal.        Thought Content: Thought content normal.        Judgment: Judgment normal.     BP 128/79   Pulse 87   Ht '5\' 6"'$  (1.676 m)   Wt 240 lb 14.4 oz (109.3 kg)   BMI 38.88 kg/m  Wt Readings from Last 3 Encounters:  08/02/22 240 lb 14.4 oz (109.3 kg)  06/06/22 250 lb 3.2 oz (113.5 kg)  05/02/22 253 lb 6.4 oz (114.9 kg)     Health Maintenance Due  Topic Date Due   COVID-19 Vaccine (1) Never  done   FOOT EXAM  Never done   OPHTHALMOLOGY EXAM  Never done   URINE MICROALBUMIN  Never done   HIV Screening  Never done   Hepatitis C Screening  Never done   COLONOSCOPY (Pts 45-42yr Insurance coverage will need to be confirmed)  Never done    There are no preventive care reminders to display for this patient.  Lab Results  Component Value Date   TSH 1.438 09/06/2021   Lab Results  Component Value Date   WBC 12.8 (H) 05/18/2017   HGB 13.9 05/18/2017   HCT 40.8 05/18/2017   MCV 83.2 05/18/2017   PLT 251 05/18/2017   Lab Results  Component Value Date   NA 138 05/18/2017   K 4.5 05/18/2017   CO2 23 05/18/2017   GLUCOSE 110 (H) 05/18/2017   BUN 15 05/18/2017   CREATININE 0.85 05/18/2017   BILITOT 0.7 05/18/2017   ALKPHOS 66 05/18/2017   AST 24 05/18/2017   ALT 18 05/18/2017   PROT 7.3 05/18/2017   ALBUMIN 3.7 05/18/2017   CALCIUM 9.2 05/18/2017   ANIONGAP 7 05/18/2017   Lab Results  Component Value Date   CHOL 155 03/29/2022   Lab Results  Component Value Date   HDL 26 (L) 03/29/2022   Lab Results  Component Value Date   [facility] 03/[facility]     Comment:     . LDL cholesterol not calculated.  Triglyceride levels greater than 400 mg/dL invalidate calculated LDL results. . Reference range: <100 . Desirable range <100 mg/dL for primary prevention;   <70 mg/dL for patients with CHD or diabetic patients  with > or = 2 CHD risk factors. Marland Kitchen LDL-C is now calculated using the Martin-Hopkins  calculation, which is a validated novel method providing  better accuracy than the Friedewald equation in the  estimation of LDL-C.  Cresenciano Genre et al. Annamaria Helling. 2130;865(78): 2061-2068  (http://education.[employer].com/faq/FAQ164)    Lab Results  Component Value Date   TRIG 419 (H) 03/29/2022   Lab Results  Component Value Date   CHOLHDL 6.0 (H) 03/29/2022   Lab Results  Component Value Date   HGBA1C 5.0 08/02/2022      Assessment & Plan:   Problem List  Items Addressed This Visit       Cardiovascular and Mediastinum   Essential hypertension    Blood pressure 128/79 in the office today. Advised patient to eat low-salt heart healthy diet. Continue the current medication.      Relevant Medications   propranolol ER (INDERAL LA) 60 MG 24 hr capsule     Other   Prediabetes - Primary    Patient hemoglobin A1c 5 in the office today. Advised patient to eat healthy and follow regular exercise routine. Advised patient to continue Ozempic.       Relevant Orders   POCT glucose (manual entry) (Completed)   POCT glycosylated hemoglobin (Hb A1C) (Completed)   MDD (major depressive disorder), recurrent, in full remission ([place])    Stable with therapy.        Meds ordered this encounter  Medications   propranolol ER (INDERAL LA) 60 MG 24 hr capsule    Sig: Take 1 capsule (60 mg total) by mouth daily.    Dispense:  30 capsule    Refill:  6     Follow-up: Return in about 3 months (around 11/02/2022), or if symptoms worsen or fail to improve.    Theresia Lo, NP

## 2022-08-05 ENCOUNTER — Ambulatory Visit: Payer: Medicare HMO

## 2022-08-05 ENCOUNTER — Ambulatory Visit (INDEPENDENT_AMBULATORY_CARE_PROVIDER_SITE_OTHER): Payer: Medicare HMO | Admitting: Licensed Clinical Social Worker

## 2022-08-05 DIAGNOSIS — F3342 Major depressive disorder, recurrent, in full remission: Secondary | ICD-10-CM | POA: Diagnosis not present

## 2022-08-05 DIAGNOSIS — F4000 Agoraphobia, unspecified: Secondary | ICD-10-CM | POA: Diagnosis not present

## 2022-08-05 NOTE — Progress Notes (Signed)
Virtual Visit via Video Note  I connected with Mary Koch on 06/03/22 at  4:00 PM EDT by a video enabled telemedicine application and verified that I am speaking with the correct person using two identifiers.  Location: Patient: home Provider: remote office [place], [place])   I discussed the limitations of evaluation and management by telemedicine and the availability of in person appointments. The patient expressed understanding and agreed to proceed.   I discussed the assessment and treatment plan with the patient. The patient was provided an opportunity to ask questions and all were answered. The patient agreed with the plan and demonstrated an understanding of the instructions.   The patient was advised to call back or seek an in-person evaluation if the symptoms worsen or if the condition fails to improve as anticipated.  I provided 35 minutes of non-face-to-face time during this encounter.   Ishana Blades R Makala Fetterolf, LCSW   THERAPIST PROGRESS NOTE  Session Time: 4-435p  Participation Level: Active  Behavioral Response: Neat and Well GroomedAlertAnxious and Depressed  Type of Therapy: Individual Therapy  Treatment Goals addressed:   ProgressTowards Goals: Progressing  Interventions: Motivational Interviewing and Supportive  Summary: Mary Koch is a 51 y.o. female who presents with iimproving symptoms related to depression and anxiety. Pt reports that she is compliant with her medication (cymbalta) and is managing symptoms well. Pt states she is crying when she watches animal videos and feels that "something is wrong". Discussed differing sensitivities in others, especially if they are passionate about a topic (ex. Animals, children, etc.). Pt states that she loves animals and understands that this is a trigger for her.   Allowed pt to explore and express thoughts and feelings associated with recent life situations and external stressors. Patient reports that her friend  is still undecided as to whether she wants to move in with patient or not. Patient reports that she really doesn't want her friend to move in because "she talks too much patient also reports that her friend has multiple animals, and patient also has multiple animals--patient feels that this may not be a good mix. Patient reports that she wants to be there for her friend, and wants her friend to know that if things continue to stay bad between her friend and friend's boyfriend that she has a safe place to go (pts house).   Patient reports that she recently received a diagnosis of diabetes, and was given the prescription ozempic. Patient reports that she has lost over 23 pounds. Patient reports she does not feel more energetic yet. Discussed overall activity levels, and patient states that she knows she needs to do better period patient reports that she is a member of the [employer], and has a free gym membership. Patient reports that she does not like being in crowd, or being around people and that this is the primary barrier to attending classes or working out. Encouraged patient to choose times where the gym will be less crowded. Patient is aware that the times that are convenient for her may not work with structured classes.  Allow patient to share current coping skills for managing depression and anxiety symptoms patient states that she often forgets what to do in the moment.  Continued recommendations are as follows: self care behaviors, positive social engagements, focusing on overall work/home/life balance, and focusing on positive physical and emotional wellness.    Suicidal/Homicidal: No  Therapist Response: Pt is continuing to apply interventions learned in session into daily life  situations. Pt is currently on track to meet goals utilizing interventions mentioned above. Personal growth and progress noted. Treatment to continue as indicated.   Plan: Return again in 4  weeks.  Diagnosis:  Encounter Diagnoses  Name Primary?   MDD (major depressive disorder), recurrent, in full remission ([place]) Yes   Agoraphobia     Collaboration of Care: Other pt encouraged to continue care with psychiatrist of record, Dr. Ursula Alert  Patient/Guardian was advised Release of Information must be obtained prior to any record release in order to collaborate their care with an outside provider. Patient/Guardian was advised if they have not already done so to contact the registration department to sign all necessary forms in order for [place] to release information regarding their care.   Consent: Patient/Guardian gives verbal consent for treatment and assignment of benefits for services provided during this visit. Patient/Guardian expressed understanding and agreed to proceed.   [place], LCSW 08/05/2022

## 2022-08-09 ENCOUNTER — Ambulatory Visit: Payer: Medicare HMO | Attending: Obstetrics

## 2022-08-09 DIAGNOSIS — R278 Other lack of coordination: Secondary | ICD-10-CM | POA: Insufficient documentation

## 2022-08-09 DIAGNOSIS — M6289 Other specified disorders of muscle: Secondary | ICD-10-CM | POA: Diagnosis not present

## 2022-08-09 DIAGNOSIS — M6281 Muscle weakness (generalized): Secondary | ICD-10-CM | POA: Diagnosis not present

## 2022-08-09 NOTE — Therapy (Signed)
OUTPATIENT PHYSICAL THERAPY FEMALE PELVIC TREATMENT   Patient Name: Mary Koch MRN: [id number] DOB:03-13-1971, 51 y.o., female Today's Date: 08/09/2022   PT End of Session - 08/09/22 1014     Visit Number 3    Number of Visits 12    Date for PT Re-Evaluation 10/01/22    PT Start Time 5809    PT Stop Time 1055    PT Time Calculation (min) 40 min    Activity Tolerance Patient tolerated treatment well             Past Medical History:  Diagnosis Date   Allergic rhinitis    Anemia    Anxiety    Anxiety and depression    Carpal tunnel syndrome    COVID-19 01/15/2020   Depression    Fibroids    GERD (gastroesophageal reflux disease)    Headache(784.0)    Headache(784.0)    Heavy periods    Hyperthyroidism    right portion of thyroid removed   Muscle spasm    Neurological disorder    evaluation for ms   Painful menstrual periods    Shortness of breath    when i smoke a lot   Past Surgical History:  Procedure Laterality Date   BREAST BIOPSY Left    neg   carpel tunnel Left 2017   CESAREAN SECTION     times 2   CHOLECYSTECTOMY     THYROID LOBECTOMY     Patient Active Problem List   Diagnosis Date Noted   Hot flashes 03/29/2022   Shortness of breath 09/25/2021   Severe episode of recurrent major depressive disorder, without psychotic features ([place]) 09/13/2021   EKG abnormalities 09/10/2021   Insomnia due to medical condition 09/05/2021   At risk for prolonged QT interval syndrome 09/05/2021   Prediabetes 03/30/2021   Chronic pain of left knee 03/30/2021   MDD (major depressive disorder), recurrent episode, moderate ([place]) 11/13/2020   Essential hypertension 10/23/2020   Other specified diabetes mellitus with other specified complication ([place]) 98/33/8250   Recurrent major depressive disorder, in full remission ([place]) 07/25/2020   Tick bite 07/10/2020   Blurred vision 07/10/2020   Lethargy 07/10/2020   High risk medication use 04/06/2020   Panic  disorder 07/28/2019   Bereavement 07/28/2019   MDD (major depressive disorder), recurrent episode, with atypical features ([place]) 07/28/2019   Agoraphobia 07/28/2019   Insomnia due to other mental disorder 07/28/2019   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   Tobacco use disorder 12/16/2017   Anxiety 06/25/2015   Carpal tunnel syndrome 06/25/2015   Family planning 06/25/2015   Bloodgood disease 06/25/2015   Fibroid 06/25/2015   Acid reflux 06/25/2015   Excess, menstruation 06/25/2015   Increased BMI 06/25/2015   Class 3 severe obesity due to excess calories with serious comorbidity and body mass index (BMI) of 40.0 to 44.9 in adult [facility]) 06/25/2015   Dysmenorrhea 06/25/2015   Disease of thyroid gland 06/25/2015   Compulsive tobacco user syndrome 06/25/2015   Episodic tension type headache 08/23/2014   Allergic rhinitis 07/23/2013   Wheezing 07/23/2013   Lower urinary tract infectious disease 03/11/2013   Hematuria 03/11/2013   D (diarrhea) 01/13/2013   Vomiting 01/13/2013   Amenorrhea 12/22/2012   HLD (hyperlipidemia) 08/31/2011    PCP: Cletis Athens, MD  REFERRING PROVIDER: Lurlean Horns, CNM   REFERRING DIAG:  R32 (ICD-10-CM) - Urinary incontinence in female   THERAPY DIAG:  Other lack of coordination  Muscle weakness (generalized)  Pelvic floor  dysfunction  Rationale for Evaluation and Treatment: Rehabilitation  ONSET DATE: About 13 years                                                                                                                                                                                       PRECAUTIONS: None  WEIGHT BEARING RESTRICTIONS: No  FALLS:  Has patient fallen in last 6 months? No  OCCUPATION/SOCIAL ACTIVITIES: On disability, enjoys walking  PLOF: Independent   CHIEF CONCERN: Pt reports her Dr. referred her here, but did not know what to expect with PFPT. Pt is having urinary leakage with sit<>stand transfers, laughing,  coughing, and squatting. The leakage has become worse over the past year even though she has had urinary leakage since her last born child (40 years old).   LIVING ENVIRONMENT: Lives with: lives with their family Lives in: Mobile home   PATIENT GOALS: Pt wants to be able to not have any leakage where she needs to change and the ability to not use the bathroom as much.     UROLOGICAL HISTORY Fluid intake: Yes: water (3 12 oz), mountain dew, diet mountain dew (6 12oz or bigger), will mix the two (diet and regular)   Pain with urination: No Fully empty bladder: No Stream: constant and strong  Toileting posture: feet flat  Urgency: Yes:   Frequency: 12x/day Nocturia: 4x  Leakage: Urge to void, Walking to the bathroom, Coughing, Sneezing, Laughing, Lifting, and Bending forward Pads: No, will change and take a shower, 2x per day Bladder control (0-10): 4/10  GASTROINTESTINAL HISTORY Pt has no concerns    SEXUAL HISTORY/FUNCTION Pt has no concerns    OBSTETRICAL HISTORY Vaginal deliveries: G2P2 Tearing: No C-section deliveries: both c-section  Currently pregnant: No  GYNECOLOGICAL HISTORY Hysterectomy: no Pelvic Organ Prolapse: None Heaviness/pressure: no  SUBJECTIVE:  Pt has been able to practice breathing when she remembers. No significant changes.   PAIN:  Are you having pain? No   OBJECTIVE:   COGNITION: Overall cognitive status: Within functional limits for tasks assessed     POSTURE:  L shoulder higher, decreased lumbar lordosis in standing and sitting, posterior pelvic tilt in sitting     RANGE OF MOTION:    (Norm range in degrees)  LEFT 07/09/22 RIGHT 07/09/22  Lumbar forward flexion (65):  WNL    Lumbar extension (30): WNL    Lumbar lateral flexion (25):  WNL* WNL*  Thoracic and Lumbar rotation (30 degrees):    WNL WNL  Hip Flexion (0-125):   WNL WNL  Hip IR (0-45):  Restricted WNL  Hip ER (0-45):  WNL WNL*  Hip Adduction:  Hip Abduction  (0-40):  WNL WNL  Hip extension (0-15):     (*= pain, Blank rows = not tested)   STRENGTH: MMT   RLE 07/09/22 LLE 07/09/22  Hip Flexion 5 4  Hip Extension 5 5  Hip Abduction     Hip Adduction     Hip ER  4 4  Hip IR  4 4  Knee Extension 5 5  Knee Flexion 4 4  Dorsiflexion     Plantarflexion (seated) 5 5  (*= pain, Blank rows = not tested)   SPECIAL TESTS:  FABER (SN 81): positive on R, at inner groin FADIR (SN 94): negative B  PALPATION:  Abdominal:  Diastasis: none Scar mobility: present, 2 c-sections, restrictions noted above the scar and at with increased sharp/cutting feeling on the L side of the scar   Present at RUQ from gallbladder removal, 3 small scars, tension noted upon palpation    Palpation across abdomen: tenderness and discomfort at the center of abdomen and RLQ/LLQ, significant tension noted   TODAY'S TREATMENT  Manual Therapy: Myofascial abdominal release across the abdomen for improved tissue extensibility  Moderate tension upon palpation, towards the center of abdomen and RLQ/LLQ  Improved tension with increased time   Scar mobilization at c-section scar for improve scar restriction/extensibility Pt described feeling of cutting/sharp towards the L side of the scar which improved with continued intervention    Neuromuscular Re-education:  EXTERNAL PELVIC EXAM: Patient educated on the purpose of the pelvic exam and articulated understanding; patient consented to the exam verbally.  Breath coordination: inconsistent Voluntary Contraction: present, 3/5 MMT with Valsalva  Relaxation: full  Perineal movement with sustained IAP increase ("bear down"): no change Perineal movement with rapid IAP increase ("cough"): no change (0= no contraction, 1= flicker, 2= weak squeeze, 3= fair squeeze with lift, 4= good squeeze and lift against resistance, 5= strong squeeze against strong resistance)   Supine hooklying diaphragmatic breathing with VCs and TCs for  downregulation of the nervous system and improved management of IAP  Supine hooklying PFM lengthening techniques with diaphragmatic breathing, VCs and TCs as needed              B Single knee to chest   Butterfly pose "adductor stretch"   Supine piriformis pose B    Patient response to interventions: Pt felt less "tightness" at the bottom of the c-section scar. Pt reports feel a squeeze at the scar site when walking.    Patient Education:  Patientprovided with HEP including: PFM lengthening techniques above/seated diaphragmatic breathing. Patient educated throughout session on appropriate technique and form using multi-modal cueing, HEP, and activity modification. Patient will benefit from further education in order to maximize compliance and understanding for long-term therapeutic gains.    ASSESSMENT:  Clinical Impression: Patient with excellent motivation to participate in today's session. Pt continues to demonstrates deficits in IAP management, PFM coordination, PFM strength, posture, pain, muscular tension, and scar mobility. Upon PFM external examination, Pt demonstrates deficits in PFM coordination, PFM strength, and PFM extensibility as evidenced by inconsistent breath coordination, 3/5 MMT with noted Valsalva maneuver, and no change of PFM with sustained IAP increase ("bear down"). Pt continues to have moderate tension throughout the abdominal cavity and scar restriction causing a "cutting" feeling at the scar site. Pt reports feeling subsided with increased time of manual intervention. Pt required moderate VCs and TCs for proper technique. Pt responded well to all active, manual, and educational interventions. Patient will continue to benefit from  skilled therapeutic intervention to address deficits in IAP management, PFM coordination, PFM strength, posture, pain, muscular tension, and scar mobility in order to increase PLOF and improve overall QOL.    Objective Impairments:  decreased coordination, decreased endurance, decreased strength, increased fascial restrictions, improper body mechanics, postural dysfunction, and pain.   Activity Limitations: lifting, bending, sitting, standing, squatting, sleeping, continence, and locomotion level  Personal Factors: Age, Behavior pattern, Past/current experiences, Time since onset of injury/illness/exacerbation, and 1-2 comorbidities: hyperthyroidism and anxiety/depression  are also affecting patient's functional outcome.   Rehab Potential: Good  Clinical Decision Making: Evolving/moderate complexity  Evaluation Complexity: Moderate   GOALS: Goals reviewed with patient? Yes  SHORT TERM GOALS: Target date: 09/20/2022   Patient will decrease intake of caffeinated beverages and other bladder irritants to less than/= 3 cups in order to manage urinary frequency and bladder irritation for improved overall QOL and participation at home and in the community. Baseline: mountain dew, diet mountain dew (6 12oz or bigger), will mix the two (diet and regular)  Goal status: INITIAL    LONG TERM GOALS: Target date: 11/01/2022 (10/01/22)  Patient will report confidence in ability to control bladder > 7/10 in order to demonstrate improved function and ability to participate more fully in activities at home and in the community. Baseline: 4/10 Goal status: INITIAL  2.  Patient will report less than 5 incidents of stress urinary incontinence over the course of 3 weeks while coughing/sneezing/laughing/STS transfer/squatting in order to demonstrate improved PFM coordination, strength, and function for improved overall QOL. Baseline: leakage every time with any of the above  Goal status: INITIAL  3.  Patient will report a decrease in voiding intervals, matching age-related norms, during the night in order to demonstrate improved control of the bladder, PFM coordination, sleep quality, and overall QOL.  Baseline: 4x/night Goal  status: INITIAL  4.  Patient will report decreased number of changing clothes, due to leakage, as indicated by a 24 hour period to demonstrate improved bladder control and allow for increased participation in activities outside of the home without limitation or disruption. Baseline: 2x/day  Goal status: INITIAL  5.  Patient will demonstrate independent and coordinated diaphragmatic breathing in supine with a 1:2 breathing pattern for improved down-regulation of the nervous system and improved management of intra-abdominal pressures in order to increase function at home and in the community. Baseline: will assess next visit; (8/11): inconsistent breath Goal status: INITIAL  6.  Patient will score >/= 57 on FOTO Urinary Problem  in order to demonstrate  improved PFM coordination, improved IAP management, and overall improved QOL.  Baseline: 50 Goal status: INITIAL  PLAN: PT Frequency: 1x/week  PT Duration: 12 weeks  Planned Interventions: Therapeutic exercises, Therapeutic activity, Neuromuscular re-education, Balance training, Gait training, Patient/Family education, Joint mobilization, Spinal mobilization, Cryotherapy, Moist heat, scar mobilization, Taping, and Manual therapy  Plan For Next Session: how was scar massage? Start deep core   [employer], PT, DPT  08/09/2022, 10:15 AM

## 2022-08-12 ENCOUNTER — Ambulatory Visit: Payer: Medicare HMO

## 2022-08-14 ENCOUNTER — Ambulatory Visit: Payer: Medicare HMO

## 2022-08-14 ENCOUNTER — Encounter: Payer: Self-pay | Admitting: Nurse Practitioner

## 2022-08-14 DIAGNOSIS — F3342 Major depressive disorder, recurrent, in full remission: Secondary | ICD-10-CM | POA: Insufficient documentation

## 2022-08-14 NOTE — Assessment & Plan Note (Signed)
Patient hemoglobin A1c 5 in the office today. Advised patient to eat healthy and follow regular exercise routine. Advised patient to continue Ozempic.

## 2022-08-14 NOTE — Assessment & Plan Note (Signed)
Blood pressure 128/79 in the office today. Advised patient to eat low-salt heart healthy diet. Continue the current medication.

## 2022-08-14 NOTE — Assessment & Plan Note (Signed)
Stable with therapy.

## 2022-08-19 ENCOUNTER — Ambulatory Visit: Payer: Medicare HMO

## 2022-08-21 ENCOUNTER — Ambulatory Visit: Payer: Medicare HMO

## 2022-08-21 DIAGNOSIS — R278 Other lack of coordination: Secondary | ICD-10-CM | POA: Diagnosis not present

## 2022-08-21 DIAGNOSIS — M6289 Other specified disorders of muscle: Secondary | ICD-10-CM

## 2022-08-21 DIAGNOSIS — M6281 Muscle weakness (generalized): Secondary | ICD-10-CM | POA: Diagnosis not present

## 2022-08-21 NOTE — Therapy (Signed)
OUTPATIENT PHYSICAL THERAPY FEMALE PELVIC TREATMENT   Patient Name: Mary Koch MRN: [id number] DOB:Oct 09, 1971, 51 y.o., female Today's Date: 08/21/2022   PT End of Session - 08/21/22 1620     Visit Number 4    Number of Visits 12    Date for PT Re-Evaluation 10/01/22    Authorization Type IE: 07/09/22    PT Start Time 1617    PT Stop Time 7106    PT Time Calculation (min) 18 min    Activity Tolerance Patient tolerated treatment well             Past Medical History:  Diagnosis Date   Allergic rhinitis    Anemia    Anxiety    Anxiety and depression    Carpal tunnel syndrome    COVID-19 01/15/2020   Depression    Fibroids    GERD (gastroesophageal reflux disease)    Headache(784.0)    Headache(784.0)    Heavy periods    Hyperthyroidism    right portion of thyroid removed   Muscle spasm    Neurological disorder    evaluation for ms   Painful menstrual periods    Shortness of breath    when i smoke a lot   Past Surgical History:  Procedure Laterality Date   BREAST BIOPSY Left    neg   carpel tunnel Left 2017   CESAREAN SECTION     times 2   CHOLECYSTECTOMY     THYROID LOBECTOMY     Patient Active Problem List   Diagnosis Date Noted   MDD (major depressive disorder), recurrent, in full remission ([place]) 08/14/2022   Hot flashes 03/29/2022   Shortness of breath 09/25/2021   Severe episode of recurrent major depressive disorder, without psychotic features ([place]) 09/13/2021   EKG abnormalities 09/10/2021   Insomnia due to medical condition 09/05/2021   At risk for prolonged QT interval syndrome 09/05/2021   Prediabetes 03/30/2021   Chronic pain of left knee 03/30/2021   MDD (major depressive disorder), recurrent episode, moderate ([place]) 11/13/2020   Essential hypertension 10/23/2020   Other specified diabetes mellitus with other specified complication ([place]) 26/94/8546   Recurrent major depressive disorder, in full remission ([place]) 07/25/2020   Tick bite  07/10/2020   Blurred vision 07/10/2020   Lethargy 07/10/2020   High risk medication use 04/06/2020   Panic disorder 07/28/2019   Bereavement 07/28/2019   MDD (major depressive disorder), recurrent episode, with atypical features ([employer]) 07/28/2019   Agoraphobia 07/28/2019   Insomnia due to other mental disorder 07/28/2019   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   Tobacco use disorder 12/16/2017   Anxiety 06/25/2015   Carpal tunnel syndrome 06/25/2015   Family planning 06/25/2015   Bloodgood disease 06/25/2015   Fibroid 06/25/2015   Acid reflux 06/25/2015   Excess, menstruation 06/25/2015   Increased BMI 06/25/2015   Class 3 severe obesity due to excess calories with serious comorbidity and body mass index (BMI) of 40.0 to 44.9 in adult [facility]) 06/25/2015   Dysmenorrhea 06/25/2015   Disease of thyroid gland 06/25/2015   Compulsive tobacco user syndrome 06/25/2015   Episodic tension type headache 08/23/2014   Allergic rhinitis 07/23/2013   Wheezing 07/23/2013   Lower urinary tract infectious disease 03/11/2013   Hematuria 03/11/2013   D (diarrhea) 01/13/2013   Vomiting 01/13/2013   Amenorrhea 12/22/2012   HLD (hyperlipidemia) 08/31/2011    PCP: Cletis Athens, MD  REFERRING PROVIDER: Lurlean Horns, CNM   REFERRING DIAG:  R32 (ICD-10-CM) - Urinary  incontinence in female   THERAPY DIAG:  Other lack of coordination  Muscle weakness (generalized)  Pelvic floor dysfunction  Rationale for Evaluation and Treatment: Rehabilitation  ONSET DATE: About 13 years                                                                                                                                                                                       PRECAUTIONS: None  WEIGHT BEARING RESTRICTIONS: No  FALLS:  Has patient fallen in last 6 months? No  OCCUPATION/SOCIAL ACTIVITIES: On disability, enjoys walking  PLOF: Independent   CHIEF CONCERN: Pt reports her Dr. referred her here,  but did not know what to expect with PFPT. Pt is having urinary leakage with sit<>stand transfers, laughing, coughing, and squatting. The leakage has become worse over the past year even though she has had urinary leakage since her last born child (58 years old).   LIVING ENVIRONMENT: Lives with: lives with their family Lives in: Mobile home   PATIENT GOALS: Pt wants to be able to not have any leakage where she needs to change and the ability to not use the bathroom as much.     UROLOGICAL HISTORY Fluid intake: Yes: water (3 12 oz), mountain dew, diet mountain dew (6 12oz or bigger), will mix the two (diet and regular)   Pain with urination: No Fully empty bladder: No Stream: constant and strong  Toileting posture: feet flat  Urgency: Yes:   Frequency: 12x/day Nocturia: 4x  Leakage: Urge to void, Walking to the bathroom, Coughing, Sneezing, Laughing, Lifting, and Bending forward Pads: No, will change and take a shower, 2x per day Bladder control (0-10): 4/10  GASTROINTESTINAL HISTORY Pt has no concerns    SEXUAL HISTORY/FUNCTION Pt has no concerns    OBSTETRICAL HISTORY Vaginal deliveries: G2P2 Tearing: No C-section deliveries: both c-section  Currently pregnant: No  GYNECOLOGICAL HISTORY Hysterectomy: no Pelvic Organ Prolapse: None Heaviness/pressure: no  SUBJECTIVE:  Pt is having a flare up of arthritic pain today in the body. Pt feels most pain in the hands, arms, and elbows. Pt has been practicing scar massage at least 3x this week and continues to improve in sensation and decreased "tearing" pain she felt initially.  PAIN:  Are you having pain? Yes  NPRS: 8/10 -achy,weak   OBJECTIVE:   COGNITION: Overall cognitive status: Within functional limits for tasks assessed     POSTURE:  L shoulder higher, decreased lumbar lordosis in standing and sitting, posterior pelvic tilt in sitting     RANGE OF MOTION:    (Norm range in degrees)  LEFT 07/09/22  RIGHT 07/09/22  Lumbar forward flexion (65):  WNL    Lumbar extension (30): WNL    Lumbar lateral flexion (25):  WNL* WNL*  Thoracic and Lumbar rotation (30 degrees):    WNL WNL  Hip Flexion (0-125):   WNL WNL  Hip IR (0-45):  Restricted WNL  Hip ER (0-45):  WNL WNL*  Hip Adduction:      Hip Abduction (0-40):  WNL WNL  Hip extension (0-15):     (*= pain, Blank rows = not tested)   STRENGTH: MMT   RLE 07/09/22 LLE 07/09/22  Hip Flexion 5 4  Hip Extension 5 5  Hip Abduction     Hip Adduction     Hip ER  4 4  Hip IR  4 4  Knee Extension 5 5  Knee Flexion 4 4  Dorsiflexion     Plantarflexion (seated) 5 5  (*= pain, Blank rows = not tested)   SPECIAL TESTS:  FABER (SN 81): positive on R, at inner groin FADIR (SN 94): negative B  PALPATION:  Abdominal:  Diastasis: none Scar mobility: present, 2 c-sections, restrictions noted above the scar and at with increased sharp/cutting feeling on the L side of the scar   Present at RUQ from gallbladder removal, 3 small scars, tension noted upon palpation    Palpation across abdomen: tenderness and discomfort at the center of abdomen and RLQ/LLQ, significant tension noted   TODAY'S TREATMENT  Neuromuscular Re-education: Supine hooklying diaphragmatic breathing with VCs and TCs for downregulation of the nervous system and improved management of IAP  [facility] abdominal [facility]   Supine hooklying TrA contraction with coordinated exhale   Discussion on using diaphragmatic breathing as a mechanism for downregulation nervous system especially with increased pain. Pt verbalized understanding.    Patient response to interventions: Due to Pt with increased body aches and pain, treatment ended early.    Patient Education:  Patientprovided with HEP including: supine TrA activation. Patient educated throughout session on appropriate technique and form using multi-modal cueing, HEP, and activity modification. Patient will benefit from further  education in order to maximize compliance and understanding for long-term therapeutic gains.    ASSESSMENT:  Clinical Impression: Patient with excellent motivation to participate in today's session. Pt continues to demonstrates deficits in IAP management, PFM coordination, PFM strength, posture, pain, muscular tension, and scar mobility. Pt with increased pain in the hands, elbows, and arms, 8/10. Pt reports these flare-ups happen every so often where she it makes her feel weak. Pt only tolerated discussion and beginning of gentle TrA activation. Pt able to feel subtle tightening of TrA muscle with coordinated exhale with moderate VCs and TCs. Session ended early due to Pt not feeling well. Pt responded well to active and educational interventions. Patient will continue to benefit from skilled therapeutic intervention to address deficits in IAP management, PFM coordination, PFM strength, posture, pain, muscular tension, and scar mobility in order to increase PLOF and improve overall QOL.    Objective Impairments: decreased coordination, decreased endurance, decreased strength, increased fascial restrictions, improper body mechanics, postural dysfunction, and pain.   Activity Limitations: lifting, bending, sitting, standing, squatting, sleeping, continence, and locomotion level  Personal Factors: Age, Behavior pattern, Past/current experiences, Time since onset of injury/illness/exacerbation, and 1-2 comorbidities: hyperthyroidism and anxiety/depression  are also affecting patient's functional outcome.   Rehab Potential: Good  Clinical Decision Making: Evolving/moderate complexity  Evaluation Complexity: Moderate   GOALS: Goals reviewed with patient? Yes  SHORT TERM GOALS: Target date: 10/02/2022   Patient will decrease intake of  caffeinated beverages and other bladder irritants to less than/= 3 cups in order to manage urinary frequency and bladder irritation for improved overall QOL and  participation at home and in the community. Baseline: mountain dew, diet mountain dew (6 12oz or bigger), will mix the two (diet and regular)  Goal status: INITIAL    LONG TERM GOALS: Target date: 11/13/2022 (10/01/22)  Patient will report confidence in ability to control bladder > 7/10 in order to demonstrate improved function and ability to participate more fully in activities at home and in the community. Baseline: 4/10 Goal status: INITIAL  2.  Patient will report less than 5 incidents of stress urinary incontinence over the course of 3 weeks while coughing/sneezing/laughing/STS transfer/squatting in order to demonstrate improved PFM coordination, strength, and function for improved overall QOL. Baseline: leakage every time with any of the above  Goal status: INITIAL  3.  Patient will report a decrease in voiding intervals, matching age-related norms, during the night in order to demonstrate improved control of the bladder, PFM coordination, sleep quality, and overall QOL.  Baseline: 4x/night Goal status: INITIAL  4.  Patient will report decreased number of changing clothes, due to leakage, as indicated by a 24 hour period to demonstrate improved bladder control and allow for increased participation in activities outside of the home without limitation or disruption. Baseline: 2x/day  Goal status: INITIAL  5.  Patient will demonstrate independent and coordinated diaphragmatic breathing in supine with a 1:2 breathing pattern for improved down-regulation of the nervous system and improved management of intra-abdominal pressures in order to increase function at home and in the community. Baseline: will assess next visit; (8/11): inconsistent breath Goal status: INITIAL  6.  Patient will score >/= 57 on FOTO Urinary Problem  in order to demonstrate  improved PFM coordination, improved IAP management, and overall improved QOL.  Baseline: 50 Goal status: INITIAL  PLAN: PT Frequency:  1x/week  PT Duration: 12 weeks  Planned Interventions: Therapeutic exercises, Therapeutic activity, Neuromuscular re-education, Balance training, Gait training, Patient/Family education, Joint mobilization, Spinal mobilization, Cryotherapy, Moist heat, scar mobilization, Taping, and Manual therapy  Plan For Next Session:  how was scar massage? Continue deep core in diff positions    [facility], PT, DPT  08/21/2022, 4:38 PM

## 2022-08-22 ENCOUNTER — Telehealth (INDEPENDENT_AMBULATORY_CARE_PROVIDER_SITE_OTHER): Payer: Medicare HMO | Admitting: Psychiatry

## 2022-08-22 ENCOUNTER — Encounter: Payer: Self-pay | Admitting: Psychiatry

## 2022-08-22 ENCOUNTER — Other Ambulatory Visit: Payer: Self-pay | Admitting: Internal Medicine

## 2022-08-22 DIAGNOSIS — F410 Panic disorder [episodic paroxysmal anxiety] without agoraphobia: Secondary | ICD-10-CM

## 2022-08-22 DIAGNOSIS — F3342 Major depressive disorder, recurrent, in full remission: Secondary | ICD-10-CM

## 2022-08-22 DIAGNOSIS — G4701 Insomnia due to medical condition: Secondary | ICD-10-CM | POA: Diagnosis not present

## 2022-08-22 DIAGNOSIS — F1590 Other stimulant use, unspecified, uncomplicated: Secondary | ICD-10-CM

## 2022-08-22 DIAGNOSIS — F17200 Nicotine dependence, unspecified, uncomplicated: Secondary | ICD-10-CM | POA: Diagnosis not present

## 2022-08-22 DIAGNOSIS — F4000 Agoraphobia, unspecified: Secondary | ICD-10-CM | POA: Diagnosis not present

## 2022-08-22 MED ORDER — BREXPIPRAZOLE 0.25 MG PO TABS: 0.2500 mg | ORAL_TABLET | Freq: Every day | ORAL | 1 refills | Status: DC

## 2022-08-22 MED ORDER — DULOXETINE HCL 20 MG PO CPEP: 20.0000 mg | ORAL_CAPSULE | Freq: Every day | ORAL | 0 refills | Status: DC

## 2022-08-22 NOTE — Progress Notes (Signed)
Virtual Visit via Video Note  I connected with Mary Koch on 08/22/22 at 10:00 AM EDT by a video enabled telemedicine application and verified that I am speaking with the correct person using two identifiers.  Location Provider Location : [facility] Patient Location : Home  Participants: Patient , Provider    I discussed the limitations of evaluation and management by telemedicine and the availability of in person appointments. The patient expressed understanding and agreed to proceed   I discussed the assessment and treatment plan with the patient. The patient was provided an opportunity to ask questions and all were answered. The patient agreed with the plan and demonstrated an understanding of the instructions.   The patient was advised to call back or seek an in-person evaluation if the symptoms worsen or if the condition fails to improve as anticipated.   [place] MD OP Progress Note  08/22/2022 5:44 PM [place]  MRN:  [id number]  Chief Complaint:  Chief Complaint  Patient presents with   Follow-up: 51 year old Caucasian female who has a history of MDD, panic disorder, presented for medication management.   HPI: Mary Koch is a 51 year old Caucasian female who lives in [place], has a history of MDD, panic disorder, agoraphobia, insomnia, tobacco use disorder, caffeine use disorder was evaluated by telemedicine today.  Patient today reports she currently continues to struggle with anxiety, worries about things in general.  She reports her niece is no longer using her services to take care of her baby who is 12 years old.  Patient reports she has been ruminating quite a lot about that since she is not sure why that happened.  That does make her sad.  Patient also struggles with pain.  Patient reports some days she wakes up with significant pain, does have a history of chronic pain.  Reports currently she is going through a flareup.  Does  medications like meloxicam, gabapentin  available which does not seem to help.  Agrees to follow-up with her provider.  Patient reports sleep is good.  Currently denies any suicidality, homicidality or perceptual disturbances.  Patient is compliant on her medications, denies side effects.  Continues to follow-up with her psychotherapist.  Patient denies any other concerns today.  Visit Diagnosis:    ICD-10-CM   1. MDD (major depressive disorder), recurrent, in full remission ([place])  F33.42 DULoxetine (CYMBALTA) 20 MG capsule    brexpiprazole (REXULTI) 0.25 MG TABS tablet    2. Panic disorder  F41.0 brexpiprazole (REXULTI) 0.25 MG TABS tablet    3. Agoraphobia  F40.00     4. Insomnia due to medical condition  G47.01    mood, OSA    5. Tobacco use disorder  F17.200     6. 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  Past trials of Paxil, Luvox, Klonopin, Zoloft, Seroquel, Wellbutrin, Ambien.  Past Medical History:  Past Medical History:  Diagnosis Date   Allergic rhinitis    Anemia    Anxiety    Anxiety and depression    Carpal tunnel syndrome    COVID-19 01/15/2020   Depression    Fibroids    GERD (gastroesophageal reflux disease)    Headache(784.0)    Headache(784.0)    Heavy periods    Hyperthyroidism    right portion of thyroid removed   Muscle spasm    Neurological disorder    evaluation for ms  Painful menstrual periods    Shortness of breath    when i smoke a lot    Past Surgical History:  Procedure Laterality Date   BREAST BIOPSY Left    neg   carpel tunnel Left 2017   CESAREAN SECTION     times 2   CHOLECYSTECTOMY     THYROID LOBECTOMY      Family Psychiatric History: Reviewed family psychiatric history from progress note on 03/24/2018.  Family History:  Family History  Problem Relation Age of Onset   Osteoporosis Mother    Breast cancer Mother 35   Diabetes Father    Anxiety disorder Father     Depression Father    Post-traumatic stress disorder Father    Heart disease Father    Lung cancer Father    Anxiety disorder Sister    Alcohol abuse Brother    Colon cancer Neg Hx    Ovarian cancer Neg Hx     Social History: Reviewed social history from progress note on 03/24/2018. Social History   Socioeconomic History   Marital status: Legally Separated    Spouse name: Not on file   Number of children: 2   Years of education: Not on file   Highest education level: High school graduate  Occupational History   Not on file  Tobacco Use   Smoking status: Every Day    Packs/day: 1.00    Years: 25.00    Total pack years: 25.00    Types: Cigarettes    Start date: 05/22/1986   Smokeless tobacco: Never  Vaping Use   Vaping Use: Never used  Substance and Sexual Activity   Alcohol use: No    Alcohol/week: 0.0 standard drinks of alcohol   Drug use: No   Sexual activity: Yes    Partners: Male    Birth control/protection: Surgical, Injection  Other Topics Concern   Not on file  Social History Narrative   Not on file   Social Determinants of Health   Financial Resource Strain: Medium Risk (09/27/2021)   Overall Financial Resource Strain (CARDIA)    Difficulty of Paying Living Expenses: Somewhat hard  Food Insecurity: Food Insecurity Present (09/27/2021)   Hunger Vital Sign    Worried About [place] in the Last Year: Sometimes true    Ran Out of Food in the Last Year: Sometimes true  Transportation Needs: No Transportation Needs (09/27/2021)   PRAPARE - Hydrologist (Medical): No    Lack of Transportation (Non-Medical): No  Physical Activity: Insufficiently Active (09/27/2021)   Exercise Vital Sign    Days of Exercise per Week: 2 days    Minutes of Exercise per Session: 10 min  Stress: Stress Concern Present (09/27/2021)   [place]    Feeling of Stress : Very much   Social Connections: Socially Isolated (09/27/2021)   Social Connection and Isolation Panel [NHANES]    Frequency of Communication with Friends and Family: More than three times a week    Frequency of Social Gatherings with Friends and Family: More than three times a week    Attends Religious Services: Never    Marine scientist or Organizations: No    Attends Archivist Meetings: Never    Marital Status: Separated    Allergies:  Allergies  Allergen Reactions   Azithromycin Other (See Comments)    headache   Codeine Other (See Comments)  unknown   Penicillins Other (See Comments)    headaches    Metabolic Disorder Labs: Lab Results  Component Value Date   HGBA1C 5.0 08/02/2022   Lab Results  Component Value Date   PROLACTIN 4.3 03/29/2022   Lab Results  Component Value Date   CHOL 155 03/29/2022   TRIG 419 (H) 03/29/2022   HDL 26 (L) 03/29/2022   CHOLHDL 6.0 (H) 03/29/2022   [place]  03/29/2022     Comment:     . LDL cholesterol not calculated. Triglyceride levels greater than 400 mg/dL invalidate calculated LDL results. . Reference range: <100 . Desirable range <100 mg/dL for primary prevention;   <70 mg/dL for patients with CHD or diabetic patients  with > or = 2 CHD risk factors. Marland Kitchen LDL-C is now calculated using the Martin-Hopkins  calculation, which is a validated novel method providing  better accuracy than the Friedewald equation in the  estimation of LDL-C.  Cresenciano Genre et al. Annamaria Helling. 6546;503(54): 2061-2068  (http://education.[employer].com/faq/FAQ164)    Lab Results  Component Value Date   TSH 1.438 09/06/2021    Therapeutic Level Labs: No results found for: "LITHIUM" No results found for: "VALPROATE" No results found for: "CBMZ"  Current Medications: Current Outpatient Medications  Medication Sig Dispense Refill   albuterol (PROVENTIL) (2.5 MG/3ML) 0.083% nebulizer solution Take 3 mLs (2.5 mg total) by nebulization every  6 (six) hours as needed for wheezing or shortness of breath. 150 mL 1   albuterol (VENTOLIN HFA) 108 (90 Base) MCG/ACT inhaler INHALE 1 TO 2 PUFFS BY MOUTH DAILY 6.7 each 5   BREO ELLIPTA 100-25 MCG/ACT AEPB TAKE 1 PUFF BY MOUTH EVERY DAY 60 each 4   cyclobenzaprine (FLEXERIL) 10 MG tablet Take 10 mg by mouth 3 (three) times daily as needed for muscle spasms.     DULoxetine (CYMBALTA) 20 MG capsule Take 1 capsule (20 mg total) by mouth daily. Take along with 60 mg daily , total of 80 mg 90 capsule 0   DULoxetine (CYMBALTA) 60 MG capsule TAKE 1 CAPSULE BY MOUTH EVERY DAY 90 capsule 0   gabapentin (NEURONTIN) 600 MG tablet Take 1 tablet (600 mg total) by mouth 2 (two) times daily. (Patient taking differently: Take 900 mg by mouth as directed. 300 mg daily and 600 mg at bedtime) 180 tablet 1   gabapentin (NEURONTIN) 600 MG tablet Take by mouth.     meloxicam (MOBIC) 7.5 MG tablet Take 7.5 mg by mouth daily.     omeprazole (PRILOSEC) 40 MG capsule Take 40 mg by mouth daily.     OZEMPIC, 1 MG/DOSE, 4 MG/3ML SOPN Inject 1 mg into the skin once a week.     propranolol ER (INDERAL LA) 60 MG 24 hr capsule Take 1 capsule (60 mg total) by mouth daily. 30 capsule 6   rosuvastatin (CRESTOR) 20 MG tablet Take 1 tablet (20 mg total) by mouth daily. 90 tablet 3   brexpiprazole (REXULTI) 0.25 MG TABS tablet Take 1 tablet (0.25 mg total) by mouth daily. 90 tablet 1   candesartan (ATACAND) 4 MG tablet Take 4 mg by mouth daily. (Patient not taking: Reported on 08/22/2022)     Current Facility-Administered Medications  Medication Dose Route Frequency Provider Last Rate Last Admin   medroxyPROGESTERone (DEPO-PROVERA) injection 150 mg  150 mg Intramuscular Q90 days Rubie Maid, MD   150 mg at 06/10/22 1558     Musculoskeletal: Strength & Muscle Tone:  UTA Gait & Station:  Seated Patient leans:  N/A  Psychiatric Specialty Exam: Review of Systems  Musculoskeletal:  Positive for arthralgias.   Psychiatric/Behavioral:  The patient is nervous/anxious.   All other systems reviewed and are negative.   There were no vitals taken for this visit.There is no height or weight on file to calculate BMI.  General Appearance: Casual  Eye Contact:  Fair  Speech:  Clear and Coherent  Volume:  Normal  Mood:  Anxious  Affect:  Congruent  Thought Process:  Goal Directed and Descriptions of Associations: Intact  Orientation:  Full (Time, Place, and Person)  Thought Content: Logical   Suicidal Thoughts:  No  Homicidal Thoughts:  No  Memory:  Immediate;   Fair Recent;   Fair Remote;   Fair  Judgement:  Fair  Insight:  Fair  Psychomotor Activity:  Normal  Concentration:  Concentration: Fair and Attention Span: Fair  Recall:  [employer] of Knowledge: Fair  Language: Fair  Akathisia:  No  Handed:  Right  AIMS (if indicated): done  Assets:  Communication Skills Desire for Improvement Housing Social Support  ADL's:  Intact  Cognition: WNL  Sleep:  Fair   Screenings: AIMS    Flowsheet Row Video Visit from 08/22/2022 in [facility] Video Visit from 05/22/2022 in [place] Total Score 0 0      AUDIT    [place] Visit from 05/22/2016 in [facility]  Alcohol Use Disorder Identification Test Final Score (AUDIT) 0      GAD-7    Flowsheet Row Video Visit from 08/22/2022 in [facility] Counselor from 03/05/2022 in [facility] from 10/06/2020 in [facility] Visit from 08/10/2019 in [facility]  Total GAD-7 Score '11 20 15 14      '$ [place] from 09/27/2021 in [facility]  Total Score (max 30 points ) 29      PHQ2-9    Flowsheet Row Video Visit from 08/22/2022 in [facility] Counselor from  08/05/2022 in [facility] [place] Video Visit from 05/22/2022 in [facility] Video Visit from 03/25/2022 in [facility] Counselor from 03/05/2022 in [place]  PHQ-2 Total Score '1 1 1 '$ 0 6  PHQ-9 Total Score -- -- -- -- 18      Flowsheet Row Video Visit from 08/22/2022 in [place] [facility] Counselor from 08/05/2022 in [place] Counselor from 03/05/2022 in [place] Low Risk No Risk No Risk        Assessment and Plan: Mary Koch is a 51 year old Caucasian female who has a history of MDD, panic attacks, tobacco use disorder, caffeine use disorder, insomnia was evaluated by telemedicine today.  Patient is currently struggling with anxiety, acute flareup of her chronic pain, relationship struggles.  She will benefit from following plan.  Plan MDD in remission Cymbalta as prescribed Wellbutrin SR 200 mg p.o. daily in the morning Rexulti 0.25 mg p.o. daily-long-term plan to taper off. Continue CBT with Ms. Christina Hussami  Panic disorder-unstable Increase Cymbalta to 80 mg p.o. daily for anxiety Gabapentin 900 mg p.o. daily in divided dosage-per neurology Continue CBT  Agoraphobia-stable Continue CBT  Tobacco use disorder-improving Provided counseling for 2 minutes.  Caffeine use disorder-improving Will monitor closely  Follow-up in clinic in 6 weeks or sooner  if needed.   Collaboration of Care: Collaboration of Care: Referral or follow-up with counselor/therapist AEB patient encouraged to follow up with therapist.  Patient/Guardian was advised Release of Information must be obtained prior to any record release in order to collaborate their care with an outside provider. Patient/Guardian was advised if they have not already done so to contact the registration  department to sign all necessary forms in order for [place] to release information regarding their care.   Consent: Patient/Guardian gives verbal consent for treatment and assignment of benefits for services provided during this visit. Patient/Guardian expressed understanding and agreed to proceed.   This note was generated in part or whole with voice recognition software. Voice recognition is usually quite accurate but there are transcription errors that can and very often do occur. I apologize for any typographical errors that were not detected and corrected.       Ursula Alert, MD 08/22/2022, 5:44 PM

## 2022-08-26 ENCOUNTER — Other Ambulatory Visit: Payer: Self-pay | Admitting: Certified Nurse Midwife

## 2022-08-26 ENCOUNTER — Ambulatory Visit (INDEPENDENT_AMBULATORY_CARE_PROVIDER_SITE_OTHER): Payer: Medicare HMO | Admitting: Obstetrics

## 2022-08-26 ENCOUNTER — Other Ambulatory Visit: Payer: Self-pay | Admitting: Internal Medicine

## 2022-08-26 VITALS — BP 135/77 | HR 89 | Ht 66.0 in | Wt 243.0 lb

## 2022-08-26 DIAGNOSIS — N921 Excessive and frequent menstruation with irregular cycle: Secondary | ICD-10-CM

## 2022-08-26 DIAGNOSIS — Z3042 Encounter for surveillance of injectable contraceptive: Secondary | ICD-10-CM

## 2022-08-26 MED ORDER — MEDROXYPROGESTERONE ACETATE 150 MG/ML IM SUSP
150.0000 mg | Freq: Once | INTRAMUSCULAR | Status: AC
  Administered 2022-08-26: 150 mg via INTRAMUSCULAR

## 2022-08-26 NOTE — Progress Notes (Signed)
Date last pap: 06/06/22.Marland Kitchen Last Depo-Provera: 06/10/2022. Side Effects if any: N/A. Serum HCG indicated? N/A. Depo-Provera 150 mg IM given by: Levert Feinstein CMA . Next appointment due NOV 13-27.

## 2022-09-10 ENCOUNTER — Ambulatory Visit: Payer: Medicare HMO

## 2022-09-17 ENCOUNTER — Ambulatory Visit: Payer: Medicare HMO

## 2022-09-19 DIAGNOSIS — G5603 Carpal tunnel syndrome, bilateral upper limbs: Secondary | ICD-10-CM | POA: Diagnosis not present

## 2022-09-19 DIAGNOSIS — G43119 Migraine with aura, intractable, without status migrainosus: Secondary | ICD-10-CM | POA: Diagnosis not present

## 2022-09-19 DIAGNOSIS — E1369 Other specified diabetes mellitus with other specified complication: Secondary | ICD-10-CM | POA: Diagnosis not present

## 2022-09-19 DIAGNOSIS — G4730 Sleep apnea, unspecified: Secondary | ICD-10-CM | POA: Diagnosis not present

## 2022-09-19 DIAGNOSIS — R202 Paresthesia of skin: Secondary | ICD-10-CM | POA: Diagnosis not present

## 2022-09-19 DIAGNOSIS — R200 Anesthesia of skin: Secondary | ICD-10-CM | POA: Diagnosis not present

## 2022-09-19 DIAGNOSIS — H8113 Benign paroxysmal vertigo, bilateral: Secondary | ICD-10-CM | POA: Diagnosis not present

## 2022-09-19 DIAGNOSIS — G4700 Insomnia, unspecified: Secondary | ICD-10-CM | POA: Diagnosis not present

## 2022-09-20 ENCOUNTER — Other Ambulatory Visit: Payer: Self-pay | Admitting: Internal Medicine

## 2022-09-20 DIAGNOSIS — F17200 Nicotine dependence, unspecified, uncomplicated: Secondary | ICD-10-CM

## 2022-09-23 ENCOUNTER — Other Ambulatory Visit: Payer: Self-pay | Admitting: Psychiatry

## 2022-09-23 DIAGNOSIS — F3342 Major depressive disorder, recurrent, in full remission: Secondary | ICD-10-CM

## 2022-09-23 NOTE — Telephone Encounter (Signed)
Contacted patient to verify her Wellbutrin dosage.  Currently takes Wellbutrin SR 100 mg at bedtime.  Advised patient to change Wellbutrin to morning with her breakfast.  She is currently not on a 200 mg but the 100 mg per patient report-will continue the same dosage.  Will sent a new prescription to pharmacy.

## 2022-09-24 ENCOUNTER — Ambulatory Visit: Payer: Medicare HMO

## 2022-10-06 ENCOUNTER — Other Ambulatory Visit: Payer: Self-pay | Admitting: Internal Medicine

## 2022-10-17 ENCOUNTER — Encounter: Payer: Self-pay | Admitting: Psychiatry

## 2022-10-17 ENCOUNTER — Ambulatory Visit (HOSPITAL_COMMUNITY): Payer: Medicare HMO | Admitting: Licensed Clinical Social Worker

## 2022-10-17 ENCOUNTER — Telehealth (INDEPENDENT_AMBULATORY_CARE_PROVIDER_SITE_OTHER): Payer: Medicare HMO | Admitting: Psychiatry

## 2022-10-17 DIAGNOSIS — F17200 Nicotine dependence, unspecified, uncomplicated: Secondary | ICD-10-CM | POA: Diagnosis not present

## 2022-10-17 DIAGNOSIS — F1590 Other stimulant use, unspecified, uncomplicated: Secondary | ICD-10-CM | POA: Diagnosis not present

## 2022-10-17 DIAGNOSIS — F3342 Major depressive disorder, recurrent, in full remission: Secondary | ICD-10-CM | POA: Diagnosis not present

## 2022-10-17 DIAGNOSIS — F410 Panic disorder [episodic paroxysmal anxiety] without agoraphobia: Secondary | ICD-10-CM | POA: Diagnosis not present

## 2022-10-17 DIAGNOSIS — F4000 Agoraphobia, unspecified: Secondary | ICD-10-CM

## 2022-10-17 DIAGNOSIS — G4701 Insomnia due to medical condition: Secondary | ICD-10-CM | POA: Diagnosis not present

## 2022-10-17 MED ORDER — DULOXETINE HCL 60 MG PO CPEP: ORAL_CAPSULE | ORAL | 0 refills | Status: DC

## 2022-10-17 NOTE — Progress Notes (Signed)
Pt wanted to reschedule appt due to being in the car with her son and not feeling comfortable speaking in a non-confidential location.  Pt elects to reschedule 12/4'@3'$  virtual

## 2022-10-17 NOTE — Progress Notes (Signed)
Virtual Visit via Video Note  I connected with Mary Koch on 10/17/22 at  1:40 PM EDT by a video enabled telemedicine application and verified that I am speaking with the correct person using two identifiers.  Location Provider Location : [facility] Patient Location : Home  Participants: Patient , Provider    I discussed the limitations of evaluation and management by telemedicine and the availability of in person appointments. The patient expressed understanding and agreed to proceed.  I discussed the assessment and treatment plan with the patient. The patient was provided an opportunity to ask questions and all were answered. The patient agreed with the plan and demonstrated an understanding of the instructions.   The patient was advised to call back or seek an in-person evaluation if the symptoms worsen or if the condition fails to improve as anticipated.   [place] MD OP Progress Note  10/17/2022 2:28 PM [place]  MRN:  [id number]  Chief Complaint:  Chief Complaint  Patient presents with   Follow-up   Anxiety   Depression   HPI: Mary Koch is a 51 year old Caucasian female who lives in [place], has a history of MDD, panic disorder, agoraphobia, insomnia, tobacco use disorder, caffeine use disorder was evaluated by telemedicine today.  Patient today she just [place].  She reports she enjoyed it.  Currently she is staying busy homeschooling her son as well as babysitting for her niece's child once every other week or so.  Patient reports although anxious overall she has been coping okay.  Denies any depression symptoms.  Patient reports sleep as restless.  Reports she wakes up every night around midnight and stays awake for 30 minutes to an hour since the past few weeks.  She reports this has been happening every night and not sure what changed.  She however is able to fall back asleep.  Overall she has been getting around 7 or  more hours of sleep.  Patient was recently put back on gabapentin and topiramate by her neurologist.  Denies side effects.  Patient denies any suicidality, homicidality or perceptual disturbances.  Continues to be compliant on Cymbalta.  Denies side effects.  Patient continues to smoke cigarettes, reports she tried Chantix in the past for couple of months did not help.  Would like to think about whether she would like to try it again.  Currently on Wellbutrin for her depression which also should help with smoking cessation.  Patient however does not believe it is helpful.  However she would like to stay on it for her mood symptoms.  Denies side effects.  Patient denies any other concerns today.  Visit Diagnosis:    ICD-10-CM   1. MDD (major depressive disorder), recurrent, in full remission ([place])  F33.42     2. Panic disorder  F41.0 DULoxetine (CYMBALTA) 60 MG capsule    3. Agoraphobia  F40.00     4. Insomnia due to medical condition  G47.01    Mood, OSA    5. Tobacco use disorder  F17.200     6. 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  Past trials of Paxil, Luvox, Klonopin, Zoloft, Seroquel, Wellbutrin, Ambien.  Past Medical History:  Past Medical History:  Diagnosis Date   Allergic rhinitis    Anemia    Anxiety    Anxiety and depression  Carpal tunnel syndrome    COVID-19 01/15/2020   Depression    Fibroids    GERD (gastroesophageal reflux disease)    Headache(784.0)    Headache(784.0)    Heavy periods    Hyperthyroidism    right portion of thyroid removed   Muscle spasm    Neurological disorder    evaluation for ms   Painful menstrual periods    Shortness of breath    when i smoke a lot    Past Surgical History:  Procedure Laterality Date   BREAST BIOPSY Left    neg   carpel tunnel Left 2017   CESAREAN SECTION     times 2   CHOLECYSTECTOMY     THYROID LOBECTOMY       Family Psychiatric History: Reviewed family psychiatric history from progress note on 03/24/2018.  Family History:  Family History  Problem Relation Age of Onset   Osteoporosis Mother    Breast cancer Mother 45   Diabetes Father    Anxiety disorder Father    Depression Father    Post-traumatic stress disorder Father    Heart disease Father    Lung cancer Father    Anxiety disorder Sister    Alcohol abuse Brother    Colon cancer Neg Hx    Ovarian cancer Neg Hx     Social History: Reviewed social history from progress note on 03/24/2018. Social History   Socioeconomic History   Marital status: Legally Separated    Spouse name: Not on file   Number of children: 2   Years of education: Not on file   Highest education level: High school graduate  Occupational History   Not on file  Tobacco Use   Smoking status: Every Day    Packs/day: 1.00    Years: 25.00    Total pack years: 25.00    Types: Cigarettes    Start date: 05/22/1986   Smokeless tobacco: Never  Vaping Use   Vaping Use: Never used  Substance and Sexual Activity   Alcohol use: No    Alcohol/week: 0.0 standard drinks of alcohol   Drug use: No   Sexual activity: Yes    Partners: Male    Birth control/protection: Surgical, Injection  Other Topics Concern   Not on file  Social History Narrative   Not on file   Social Determinants of Health   Financial Resource Strain: Medium Risk (09/27/2021)   Overall Financial Resource Strain (CARDIA)    Difficulty of Paying Living Expenses: Somewhat hard  Food Insecurity: Food Insecurity Present (09/27/2021)   Hunger Vital Sign    Worried About [place] in the Last Year: Sometimes true    Ran Out of Food in the Last Year: Sometimes true  Transportation Needs: No Transportation Needs (09/27/2021)   PRAPARE - Hydrologist (Medical): No    Lack of Transportation (Non-Medical): No  Physical Activity: Insufficiently Active  (09/27/2021)   Exercise Vital Sign    Days of Exercise per Week: 2 days    Minutes of Exercise per Session: 10 min  Stress: Stress Concern Present (09/27/2021)   [place]    Feeling of Stress : Very much  Social Connections: Socially Isolated (09/27/2021)   Social Connection and Isolation Panel [NHANES]    Frequency of Communication with Friends and Family: More than three times a week    Frequency of Social Gatherings with Friends and Family: More than  three times a week    Attends Religious Services: Never    Active Member of Clubs or Organizations: No    Attends Archivist Meetings: Never    Marital Status: Separated    Allergies:  Allergies  Allergen Reactions   Azithromycin Other (See Comments)    headache   Codeine Other (See Comments)    unknown   Penicillins Other (See Comments)    headaches    Metabolic Disorder Labs: Lab Results  Component Value Date   HGBA1C 5.0 08/02/2022   Lab Results  Component Value Date   PROLACTIN 4.3 03/29/2022   Lab Results  Component Value Date   CHOL 155 03/29/2022   TRIG 419 (H) 03/29/2022   HDL 26 (L) 03/29/2022   CHOLHDL 6.0 (H) 03/29/2022   LDLCALC  03/29/2022     Comment:     . LDL cholesterol not calculated. Triglyceride levels greater than 400 mg/dL invalidate calculated LDL results. . Reference range: <100 . Desirable range <100 mg/dL for primary prevention;   <70 mg/dL for patients with CHD or diabetic patients  with > or = 2 CHD risk factors. Marland Kitchen LDL-C is now calculated using the Martin-Hopkins  calculation, which is a validated novel method providing  better accuracy than the Friedewald equation in the  estimation of LDL-C.  Cresenciano Genre et al. Annamaria Helling. 4431;540(08): 2061-2068  (http://education.[employer].com/faq/FAQ164)    Lab Results  Component Value Date   TSH 1.438 09/06/2021    Therapeutic Level Labs: No results found for:  "LITHIUM" No results found for: "VALPROATE" No results found for: "CBMZ"  Current Medications: Current Outpatient Medications  Medication Sig Dispense Refill   albuterol (PROVENTIL) (2.5 MG/3ML) 0.083% nebulizer solution INHALE 3 ML BY NEBULIZATION EVERY 6 HOURS AS NEEDED FOR WHEEZING OR SHORTNESS OF BREATH 150 mL 1   albuterol (VENTOLIN HFA) 108 (90 Base) MCG/ACT inhaler INHALE 1 TO 2 PUFFS BY MOUTH DAILY 6.7 each 5   BREO ELLIPTA 100-25 MCG/ACT AEPB TAKE 1 PUFF BY MOUTH EVERY DAY 60 each 4   brexpiprazole (REXULTI) 0.25 MG TABS tablet Take 1 tablet (0.25 mg total) by mouth daily. 90 tablet 1   buPROPion ER (WELLBUTRIN SR) 100 MG 12 hr tablet Take 1 tablet (100 mg total) by mouth daily with breakfast. 90 tablet 1   cyclobenzaprine (FLEXERIL) 10 MG tablet Take 10 mg by mouth 3 (three) times daily as needed for muscle spasms.     DULoxetine (CYMBALTA) 20 MG capsule Take 1 capsule (20 mg total) by mouth daily. Take along with 60 mg daily , total of 80 mg 90 capsule 0   gabapentin (NEURONTIN) 300 MG capsule Take one pill ('300mg'$ ) every morning and 2 pills ('600mg'$ ) in the evening     meloxicam (MOBIC) 7.5 MG tablet TAKE 1 TABLET BY MOUTH EVERY DAY 30 tablet 3   omeprazole (PRILOSEC) 40 MG capsule TAKE 1 CAPSULE BY MOUTH EVERY DAY 90 capsule 3   OZEMPIC, 1 MG/DOSE, 4 MG/3ML SOPN INJECT 1 MG ONCE A WEEK AS DIRECTED 3 mL 2   propranolol ER (INDERAL LA) 60 MG 24 hr capsule Take 1 capsule (60 mg total) by mouth daily. 30 capsule 6   rosuvastatin (CRESTOR) 20 MG tablet Take 1 tablet (20 mg total) by mouth daily. 90 tablet 3   topiramate (TOPAMAX) 100 MG tablet Take by mouth.     candesartan (ATACAND) 4 MG tablet Take 4 mg by mouth daily. (Patient not taking: Reported on 10/17/2022)  DULoxetine (CYMBALTA) 60 MG capsule TAKE 1 CAPSULE BY MOUTH EVERY DAY, take along with 20 mg daily 90 capsule 0   gabapentin (NEURONTIN) 600 MG tablet Take 1 tablet (600 mg total) by mouth 2 (two) times daily. (Patient not  taking: Reported on 10/17/2022) 180 tablet 1   gabapentin (NEURONTIN) 600 MG tablet Take by mouth.     Current Facility-Administered Medications  Medication Dose Route Frequency Provider Last Rate Last Admin   medroxyPROGESTERone (DEPO-PROVERA) injection 150 mg  150 mg Intramuscular Q90 days Rubie Maid, MD   150 mg at 06/10/22 1558     Musculoskeletal: Strength & Muscle Tone:  [place]: normal Patient leans: N/A  Psychiatric Specialty Exam: Review of Systems  Psychiatric/Behavioral:  Positive for sleep disturbance. The patient is nervous/anxious.   All other systems reviewed and are negative.   There were no vitals taken for this visit.There is no height or weight on file to calculate BMI.  General Appearance: Casual  Eye Contact:  Fair  Speech:  Clear and Coherent  Volume:  Normal  Mood:  Anxious Coping well  Affect:  Congruent  Thought Process:  Goal Directed and Descriptions of Associations: Intact  Orientation:  Full (Time, Place, and Person)  Thought Content: Logical   Suicidal Thoughts:  No  Homicidal Thoughts:  No  Memory:  Immediate;   Fair Recent;   Fair Remote;   Fair  Judgement:  Fair  Insight:  Fair  Psychomotor Activity:  Normal  Concentration:  Concentration: Fair and Attention Span: Fair  Recall:  [employer] of Knowledge: Fair  Language: Fair  Akathisia:  No  Handed:  Right  AIMS (if indicated): not done  Assets:  Communication Skills Desire for Improvement Housing Social Support Transportation  ADL's:  Intact  Cognition: WNL  Sleep:   Restless   Screenings: AIMS    Flowsheet Row Video Visit from 08/22/2022 in [facility] Video Visit from 05/22/2022 in [facility] Total Score 0 0      AUDIT    [place] Visit from 05/22/2016 in [place]  Alcohol Use Disorder Identification Test Final Score (AUDIT) 0      GAD-7     Flowsheet Row Video Visit from 08/22/2022 in [facility] Counselor from 03/05/2022 in [facility] from 10/06/2020 in [facility] Visit from 08/10/2019 in [facility]  Total GAD-7 Score '11 20 15 14      '$ [place] from 09/27/2021 in [facility]  Total Score (max 30 points ) 29      PHQ2-9    Flowsheet Row Video Visit from 10/17/2022 in [place] [facility] Video Visit from 08/22/2022 in [facility] Counselor from 08/05/2022 in [facility] Video Visit from 05/22/2022 in [facility] Video Visit from 03/25/2022 in [facility]  PHQ-2 Total Score 0 '1 1 1 '$ 0      Flowsheet Row Video Visit from 10/17/2022 in [facility] Video Visit from 08/22/2022 in [facility] Counselor from 08/05/2022 in [facility] Low Risk Low Risk No Risk        Assessment and Plan: Mary Koch is a 51 year old Caucasian female who has a history of MDD, panic attacks, tobacco use disorder, caffeine use disorder,  insomnia was evaluated by telemedicine today.  Patient with sleep problems as noted above, will benefit from the following plan.  Plan MDD in remission Cymbalta 80 mg p.o. daily Wellbutrin SR 100 mg p.o. daily in the morning Rexulti 0.25 mg p.o. daily-long-term plan to taper off. Continue CBT with Ms. Christina Hussami   Panic disorder-improving Cymbalta 80 mg p.o. daily Gabapentin as prescribed per neurology Continue CBT  Insomnia-unstable Patient advised to work on sleep hygiene and to keep a sleep diary. Will reevaluate for further management at her next visit.  Patient to keep a sleep log.   Patient to call back sooner if sleep gets worse.  Agoraphobia-stable Continue CBT  Tobacco use disorder-unstable Provided counseling for 1 minute.  Discussed restarting Chantix however not interested at this time.  Caffeine use disorder-improving Monitor closely.   Follow-up in clinic in 2 to 3 months or sooner if needed.  Collaboration of Care: Collaboration of Care: Referral or follow-up with counselor/therapist AEB encouraged to continue CBT.  Patient/Guardian was advised Release of Information must be obtained prior to any record release in order to collaborate their care with an outside provider. Patient/Guardian was advised if they have not already done so to contact the registration department to sign all necessary forms in order for [place] to release information regarding their care.   Consent: Patient/Guardian gives verbal consent for treatment and assignment of benefits for services provided during this visit. Patient/Guardian expressed understanding and agreed to proceed.   This note was generated in part or whole with voice recognition software. Voice recognition is usually quite accurate but there are transcription errors that can and very often do occur. I apologize for any typographical errors that were not detected and corrected.      Ursula Alert, MD 10/17/2022, 2:28 PM

## 2022-11-01 ENCOUNTER — Ambulatory Visit (INDEPENDENT_AMBULATORY_CARE_PROVIDER_SITE_OTHER): Payer: Medicare HMO | Admitting: Nurse Practitioner

## 2022-11-01 ENCOUNTER — Encounter: Payer: Self-pay | Admitting: Nurse Practitioner

## 2022-11-01 ENCOUNTER — Other Ambulatory Visit: Payer: Self-pay | Admitting: Nurse Practitioner

## 2022-11-01 VITALS — BP 132/84 | HR 82 | Ht 66.0 in | Wt 241.3 lb

## 2022-11-01 DIAGNOSIS — I10 Essential (primary) hypertension: Secondary | ICD-10-CM

## 2022-11-01 DIAGNOSIS — E669 Obesity, unspecified: Secondary | ICD-10-CM | POA: Diagnosis not present

## 2022-11-01 DIAGNOSIS — E785 Hyperlipidemia, unspecified: Secondary | ICD-10-CM | POA: Diagnosis not present

## 2022-11-01 DIAGNOSIS — Z1211 Encounter for screening for malignant neoplasm of colon: Secondary | ICD-10-CM | POA: Diagnosis not present

## 2022-11-01 DIAGNOSIS — R7303 Prediabetes: Secondary | ICD-10-CM | POA: Diagnosis not present

## 2022-11-01 DIAGNOSIS — K219 Gastro-esophageal reflux disease without esophagitis: Secondary | ICD-10-CM | POA: Diagnosis not present

## 2022-11-01 LAB — GLUCOSE, POCT (MANUAL RESULT ENTRY): POC Glucose: 118 mg/dl — AB (ref 70–99)

## 2022-11-01 MED ORDER — OMEPRAZOLE 40 MG PO CPDR: 40.0000 mg | DELAYED_RELEASE_CAPSULE | Freq: Every day | ORAL | 3 refills | Status: DC

## 2022-11-01 MED ORDER — LOSARTAN POTASSIUM 50 MG PO TABS: 50.0000 mg | ORAL_TABLET | Freq: Every day | ORAL | 1 refills | Status: DC

## 2022-11-01 MED ORDER — OZEMPIC (1 MG/DOSE) 4 MG/3ML ~~LOC~~ SOPN: 2.5000 mg | PEN_INJECTOR | SUBCUTANEOUS | 5 refills | Status: DC

## 2022-11-01 NOTE — Progress Notes (Signed)
Established Patient Office Visit  Subjective:  Patient ID: Mary Koch, female    DOB: August 31, 1971  Age: 51 y.o. MRN: [id number]  CC:  Chief Complaint  Patient presents with   Follow-up     HPI  [name] presents for 34-monthfollow-up.  She has history of  hypertension, prediabetes, headaches, GERD, obesity, anxiety and depression.  She is followed by psychologist for anxiety and depression.  She is overall doing fine.  No new complaints at present.  HPI   Past Medical History:  Diagnosis Date   Allergic rhinitis    Anemia    Anxiety    Anxiety and depression    Carpal tunnel syndrome    COVID-19 01/15/2020   Depression    Fibroids    GERD (gastroesophageal reflux disease)    Headache(784.0)    Headache(784.0)    Heavy periods    Hyperthyroidism    right portion of thyroid removed   Muscle spasm    Neurological disorder    evaluation for ms   Painful menstrual periods    Shortness of breath    when i smoke a lot    Past Surgical History:  Procedure Laterality Date   BREAST BIOPSY Left    neg   carpel tunnel Left 2017   CESAREAN SECTION     times 2   CHOLECYSTECTOMY     THYROID LOBECTOMY      Family History  Problem Relation Age of Onset   Osteoporosis Mother    Breast cancer Mother 751  Diabetes Father    Anxiety disorder Father    Depression Father    Post-traumatic stress disorder Father    Heart disease Father    Lung cancer Father    Anxiety disorder Sister    Alcohol abuse Brother    Colon cancer Neg Hx    Ovarian cancer Neg Hx     Social History   Socioeconomic History   Marital status: Legally Separated    Spouse name: Not on file   Number of children: 2   Years of education: Not on file   Highest education level: High school graduate  Occupational History   Not on file  Tobacco Use   Smoking status: Every Day    Packs/day: 1.00    Years: 25.00    Total pack years: 25.00    Types: Cigarettes    Start date: 05/22/1986    Smokeless tobacco: Never  Vaping Use   Vaping Use: Never used  Substance and Sexual Activity   Alcohol use: No    Alcohol/week: 0.0 standard drinks of alcohol   Drug use: No   Sexual activity: Yes    Partners: Male    Birth control/protection: Surgical, Injection  Other Topics Concern   Not on file  Social History Narrative   Not on file   Social Determinants of Health   Financial Resource Strain: Medium Risk (09/27/2021)   Overall Financial Resource Strain (CARDIA)    Difficulty of Paying Living Expenses: Somewhat hard  Food Insecurity: Food Insecurity Present (09/27/2021)   Hunger Vital Sign    Worried About Running Out of Food in the Last Year: Sometimes true    Ran Out of Food in the Last Year: Sometimes true  Transportation Needs: No Transportation Needs (09/27/2021)   PRAPARE - THydrologist(Medical): No    Lack of Transportation (Non-Medical): No  Physical Activity: Insufficiently Active (09/27/2021)   Exercise Vital  Sign    Days of Exercise per Week: 2 days    Minutes of Exercise per Session: 10 min  Stress: Stress Concern Present (09/27/2021)   [place]    Feeling of Stress : Very much  Social Connections: Socially Isolated (09/27/2021)   Social Connection and Isolation Panel [NHANES]    Frequency of Communication with Friends and Family: More than three times a week    Frequency of Social Gatherings with Friends and Family: More than three times a week    Attends Religious Services: Never    Marine scientist or Organizations: No    Attends Archivist Meetings: Never    Marital Status: Separated  Intimate Partner Violence: Not At Risk (09/27/2021)   Humiliation, Afraid, Rape, and Kick questionnaire    Fear of Current or Ex-Partner: No    Emotionally Abused: No    Physically Abused: No    Sexually Abused: No     Outpatient Medications Prior to Visit   Medication Sig Dispense Refill   albuterol (PROVENTIL) (2.5 MG/3ML) 0.083% nebulizer solution INHALE 3 ML BY NEBULIZATION EVERY 6 HOURS AS NEEDED FOR WHEEZING OR SHORTNESS OF BREATH 150 mL 1   BREO ELLIPTA 100-25 MCG/ACT AEPB TAKE 1 PUFF BY MOUTH EVERY DAY 60 each 4   brexpiprazole (REXULTI) 0.25 MG TABS tablet Take 1 tablet (0.25 mg total) by mouth daily. 90 tablet 1   buPROPion ER (WELLBUTRIN SR) 100 MG 12 hr tablet Take 1 tablet (100 mg total) by mouth daily with breakfast. 90 tablet 1   cyclobenzaprine (FLEXERIL) 10 MG tablet Take 10 mg by mouth 3 (three) times daily as needed for muscle spasms.     DULoxetine (CYMBALTA) 20 MG capsule Take 1 capsule (20 mg total) by mouth daily. Take along with 60 mg daily , total of 80 mg 90 capsule 0   DULoxetine (CYMBALTA) 60 MG capsule TAKE 1 CAPSULE BY MOUTH EVERY DAY, take along with 20 mg daily 90 capsule 0   gabapentin (NEURONTIN) 300 MG capsule Take one pill ('300mg'$ ) every morning and 2 pills ('600mg'$ ) in the evening     gabapentin (NEURONTIN) 600 MG tablet Take 600 mg by mouth 3 (three) times daily.     meloxicam (MOBIC) 7.5 MG tablet TAKE 1 TABLET BY MOUTH EVERY DAY 30 tablet 3   rosuvastatin (CRESTOR) 20 MG tablet Take 1 tablet (20 mg total) by mouth daily. 90 tablet 3   topiramate (TOPAMAX) 100 MG tablet Take by mouth.     omeprazole (PRILOSEC) 40 MG capsule TAKE 1 CAPSULE BY MOUTH EVERY DAY 90 capsule 3   OZEMPIC, 1 MG/DOSE, 4 MG/3ML SOPN INJECT 1 MG ONCE A WEEK AS DIRECTED 3 mL 2   propranolol ER (INDERAL LA) 60 MG 24 hr capsule Take 1 capsule (60 mg total) by mouth daily. 30 capsule 6   albuterol (VENTOLIN HFA) 108 (90 Base) MCG/ACT inhaler INHALE 1 TO 2 PUFFS BY MOUTH DAILY (Patient not taking: Reported on 11/01/2022) 6.7 each 5   candesartan (ATACAND) 4 MG tablet Take 4 mg by mouth daily. (Patient not taking: Reported on 10/17/2022)     gabapentin (NEURONTIN) 600 MG tablet Take 1 tablet (600 mg total) by mouth 2 (two) times daily. (Patient not  taking: Reported on 10/17/2022) 180 tablet 1   gabapentin (NEURONTIN) 600 MG tablet Take by mouth.     Facility-Administered Medications Prior to Visit  Medication Dose Route Frequency Provider Last  Rate Last Admin   medroxyPROGESTERone (DEPO-PROVERA) injection 150 mg  150 mg Intramuscular Q90 days Rubie Maid, MD   150 mg at 06/10/22 1558    Allergies  Allergen Reactions   Azithromycin Other (See Comments)    headache   Codeine Other (See Comments)    unknown   Penicillins Other (See Comments)    headaches    ROS Review of Systems  Constitutional: Negative.   HENT: Negative.    Eyes: Negative.   Respiratory:  Negative for chest tightness and shortness of breath.   Cardiovascular:  Negative for chest pain and leg swelling.  Gastrointestinal: Negative.   Genitourinary: Negative.   Musculoskeletal: Negative.   Skin: Negative.   Neurological:  Negative for dizziness, facial asymmetry and headaches.  Psychiatric/Behavioral:  Negative for agitation, behavioral problems and confusion.       Objective:    Physical Exam Constitutional:      Appearance: Normal appearance. She is obese.  HENT:     Head: Normocephalic.     Right Ear: Tympanic membrane normal.     Left Ear: Tympanic membrane normal.     Nose: Nose normal.     Mouth/Throat:     Mouth: Mucous membranes are moist.     Pharynx: Oropharynx is clear.  Eyes:     Extraocular Movements: Extraocular movements intact.     Conjunctiva/sclera: Conjunctivae normal.     Pupils: Pupils are equal, round, and reactive to light.  Cardiovascular:     Rate and Rhythm: Normal rate and regular rhythm.     Pulses: Normal pulses.     Heart sounds: Normal heart sounds.  Pulmonary:     Effort: Pulmonary effort is normal. No respiratory distress.     Breath sounds: Normal breath sounds. No rhonchi.  Abdominal:     General: Bowel sounds are normal.     Palpations: Abdomen is soft. There is no mass.     Tenderness: There is no  abdominal tenderness.     Hernia: No hernia is present.  Musculoskeletal:        General: Normal range of motion.     Cervical back: Neck supple. No tenderness.  Skin:    General: Skin is warm.     Capillary Refill: Capillary refill takes less than 2 seconds.  Neurological:     General: No focal deficit present.     Mental Status: She is alert and oriented to person, place, and time. Mental status is at baseline.  Psychiatric:        Mood and Affect: Mood normal.        Behavior: Behavior normal.        Thought Content: Thought content normal.        Judgment: Judgment normal.     BP 132/84   Pulse 82   Ht '5\' 6"'$  (1.676 m)   Wt 241 lb 4.8 oz (109.5 kg)   BMI 38.95 kg/m  Wt Readings from Last 3 Encounters:  11/01/22 241 lb 4.8 oz (109.5 kg)  08/26/22 243 lb (110.2 kg)  08/02/22 240 lb 14.4 oz (109.3 kg)      Health Maintenance  Topic Date Due   Diabetic kidney evaluation - Urine ACR  Never done   Diabetic kidney evaluation - GFR measurement  05/18/2018   Medicare Annual Wellness (AWV)  09/27/2022   INFLUENZA VACCINE  03/30/2023 (Originally 07/30/2022)   COLONOSCOPY (Pts 45-90yr Insurance coverage will need to be confirmed)  11/02/2023 (Originally 01/14/2018)   Hepatitis  C Screening  11/02/2023 (Originally 01/14/1991)   HIV Screening  11/02/2023 (Originally 01/15/1988)   OPHTHALMOLOGY EXAM  11/03/2023 (Originally 01/14/1983)   COVID-19 Vaccine (1) 11/18/2023 (Originally 01/14/1978)   FOOT EXAM  11/19/2023 (Originally 01/14/1983)   HEMOGLOBIN A1C  02/02/2023   PAP SMEAR-Modifier  06/06/2025   TETANUS/TDAP  08/07/2029   HPV VACCINES  Aged Out    There are no preventive care reminders to display for this patient.  Lab Results  Component Value Date   TSH 1.438 09/06/2021   Lab Results  Component Value Date   WBC 12.8 (H) 05/18/2017   HGB 13.9 05/18/2017   HCT 40.8 05/18/2017   MCV 83.2 05/18/2017   PLT 251 05/18/2017   Lab Results  Component Value Date   NA 138  05/18/2017   K 4.5 05/18/2017   CO2 23 05/18/2017   GLUCOSE 110 (H) 05/18/2017   BUN 15 05/18/2017   CREATININE 0.85 05/18/2017   BILITOT 0.7 05/18/2017   ALKPHOS 66 05/18/2017   AST 24 05/18/2017   ALT 18 05/18/2017   PROT 7.3 05/18/2017   ALBUMIN 3.7 05/18/2017   CALCIUM 9.2 05/18/2017   ANIONGAP 7 05/18/2017   Lab Results  Component Value Date   CHOL 155 03/29/2022   Lab Results  Component Value Date   HDL 26 (L) 03/29/2022   Lab Results  Component Value Date   [facility]  03/29/2022     Comment:     . LDL cholesterol not calculated. Triglyceride levels greater than 400 mg/dL invalidate calculated LDL results. . Reference range: <100 . Desirable range <100 mg/dL for primary prevention;   <70 mg/dL for patients with CHD or diabetic patients  with > or = 2 CHD risk factors. Marland Kitchen LDL-C is now calculated using the Martin-Hopkins  calculation, which is a validated novel method providing  better accuracy than the Friedewald equation in the  estimation of LDL-C.  Cresenciano Genre et al. Annamaria Helling. 3149;702(63): 2061-2068  (http://education.[employer].com/faq/FAQ164)    Lab Results  Component Value Date   TRIG 419 (H) 03/29/2022   Lab Results  Component Value Date   CHOLHDL 6.0 (H) 03/29/2022   Lab Results  Component Value Date   HGBA1C 5.0 08/02/2022      Assessment & Plan:   Problem List Items Addressed This Visit       Cardiovascular and Mediastinum   Essential hypertension - Primary    Her blood pressure 132/84 in the office today. Started her on losartan 50 mg. Stopped propanolol. Advised patient to check blood pressure at home and bring readings to the next appointment.       Relevant Medications   losartan (COZAAR) 50 MG tablet     Digestive   Acid reflux    Stable on medication. Refilled omeprazole 40 mg.      Relevant Medications   omeprazole (PRILOSEC) 40 MG capsule     Other   HLD (hyperlipidemia)    Patient had elevated triglyceride  levels and lower HDL levels on 03/29/2022. Encourage patient to avoid fast food, fatty and greasy food and follow regular exercise schedule. Continue rosuvastatin 20 mg. Will repeat lipids at next appointment.      Relevant Medications   losartan (COZAAR) 50 MG tablet   Obesity (BMI 30-39.9)    Body mass index is 38.95 kg/m. Advised pt to lose weight. Advised patient to avoid trans fat, fatty and fried food. Follow a regular physical activity schedule.         Relevant  Medications   Semaglutide, 1 MG/DOSE, (OZEMPIC, 1 MG/DOSE,) 4 MG/3ML SOPN   Prediabetes    Her blood sugar 118 in the office today and previous hemoglobin A1c 5.0 on 08/02/2022  Increase Ozempic 2.5 mg subcu weekly. Advised patient to watch diet and follow regular activity schedule.      Relevant Orders   POCT glucose (manual entry) (Completed)   Urine microalbumin-creatinine with uACR   Other Visit Diagnoses     Screening for colon cancer       Relevant Orders   Cologuard        Meds ordered this encounter  Medications   omeprazole (PRILOSEC) 40 MG capsule    Sig: Take 1 capsule (40 mg total) by mouth daily.    Dispense:  90 capsule    Refill:  3   losartan (COZAAR) 50 MG tablet    Sig: Take 1 tablet (50 mg total) by mouth daily.    Dispense:  30 tablet    Refill:  1   Semaglutide, 1 MG/DOSE, (OZEMPIC, 1 MG/DOSE,) 4 MG/3ML SOPN    Sig: Inject 2.5 mg as directed once a week.    Dispense:  3 mL    Refill:  5     Follow-up: Return in about 3 months (around 02/01/2023).    Theresia Lo, NP

## 2022-11-01 NOTE — Assessment & Plan Note (Signed)
Her blood sugar 118 in the office today and previous hemoglobin A1c 5.0 on 08/02/2022  Increase Ozempic 2.5 mg subcu weekly. Advised patient to watch diet and follow regular activity schedule.

## 2022-11-01 NOTE — Assessment & Plan Note (Signed)
Her blood pressure 132/84 in the office today. Started her on losartan 50 mg. Stopped propanolol. Advised patient to check blood pressure at home and bring readings to the next appointment.

## 2022-11-01 NOTE — Assessment & Plan Note (Addendum)
Patient had elevated triglyceride levels and lower HDL levels on 03/29/2022. Encourage patient to avoid fast food, fatty and greasy food and follow regular exercise schedule. Continue rosuvastatin 20 mg. Will repeat lipids at next appointment.

## 2022-11-01 NOTE — Assessment & Plan Note (Signed)
Body mass index is 38.95 kg/m. Advised pt to lose weight. Advised patient to avoid trans fat, fatty and fried food. Follow a regular physical activity schedule.

## 2022-11-01 NOTE — Assessment & Plan Note (Signed)
Stable on medication. Refilled omeprazole 40 mg.

## 2022-11-04 ENCOUNTER — Other Ambulatory Visit: Payer: Self-pay | Admitting: Nurse Practitioner

## 2022-11-04 NOTE — Telephone Encounter (Signed)
2.5 mg was sent in for ozempic. That is not a dose of ozempic. It is 2 mg. 2.5 mg is monjarro. IF you want to send titration up iot will be '2mg'$  but that dose is not available at this time. I am not sure what you want to do. Please advise.

## 2022-11-05 MED ORDER — OZEMPIC (1 MG/DOSE) 4 MG/3ML ~~LOC~~ SOPN: 1.0000 mg | PEN_INJECTOR | SUBCUTANEOUS | 5 refills | Status: DC

## 2022-11-11 ENCOUNTER — Ambulatory Visit: Payer: Medicare HMO

## 2022-11-17 ENCOUNTER — Other Ambulatory Visit: Payer: Self-pay | Admitting: Psychiatry

## 2022-11-17 DIAGNOSIS — F3342 Major depressive disorder, recurrent, in full remission: Secondary | ICD-10-CM

## 2022-11-18 ENCOUNTER — Ambulatory Visit (INDEPENDENT_AMBULATORY_CARE_PROVIDER_SITE_OTHER): Payer: Medicare HMO

## 2022-11-18 VITALS — Ht 66.0 in | Wt 241.0 lb

## 2022-11-18 VITALS — BP 128/74 | Wt 236.0 lb

## 2022-11-18 DIAGNOSIS — Z0000 Encounter for general adult medical examination without abnormal findings: Secondary | ICD-10-CM

## 2022-11-18 DIAGNOSIS — Z3042 Encounter for surveillance of injectable contraceptive: Secondary | ICD-10-CM

## 2022-11-18 MED ORDER — MEDROXYPROGESTERONE ACETATE 150 MG/ML IM SUSP
150.0000 mg | Freq: Once | INTRAMUSCULAR | Status: AC
  Administered 2022-11-18: 150 mg via INTRAMUSCULAR

## 2022-11-18 NOTE — Progress Notes (Signed)
I have reviewed this visit and agree with the documentation.   

## 2022-11-18 NOTE — Progress Notes (Addendum)
Last Depo-Provera: 08/26/2022. Side Effects if any: N/A. Serum HCG indicated? N/A. Depo-Provera 150 mg IM given by: Otelia Limes, CMA . Next appointment due Feb 5 - Feb 19.   BJS:[id number] LOT: [id number] EXP: 03/29/2024

## 2022-11-18 NOTE — Progress Notes (Signed)
Virtual Visit via Telephone Note  I connected with  Mary Koch on 11/18/22 at  1:45 PM EST by telephone and verified that I am speaking with the correct person using two identifiers.  Location: Patient: home Provider: [facility] Persons participating in the virtual visit: patient/Nurse Health Advisor   I discussed the limitations, risks, security and privacy concerns of performing an evaluation and management service by telephone and the availability of in person appointments. The patient expressed understanding and agreed to proceed.  Interactive audio and video telecommunications were attempted between this nurse and patient, however failed, due to patient having technical difficulties OR patient did not have access to video capability.  We continued and completed visit with audio only.  Some vital signs may be absent or patient reported.   Dionisio David, LPN  Subjective:   Mary Koch is a 51 y.o. female who presents for Medicare Annual (Subsequent) preventive examination.  Review of Systems     Cardiac Risk Factors include: advanced age (>89mn, >>4women);diabetes mellitus;hypertension     Objective:    There were no vitals filed for this visit. There is no height or weight on file to calculate BMI.     11/18/2022    1:45 PM 07/09/2022   11:11 AM 09/27/2021   11:01 AM 12/16/2018    5:00 PM 10/21/2017    1:19 PM 08/11/2017    1:23 PM 05/18/2017    8:45 AM  Advanced Directives  Does Patient Have a Medical Advance Directive? No No No    No  Would patient like information on creating a medical advance directive? No - Patient declined No - Patient declined No - Patient declined    No - Patient declined     Information is confidential and restricted. Go to Review Flowsheets to unlock data.    Current Medications (verified) Outpatient Encounter Medications as of 11/18/2022  Medication Sig   albuterol (PROVENTIL) (2.5 MG/3ML) 0.083% nebulizer solution INHALE 3 ML BY  NEBULIZATION EVERY 6 HOURS AS NEEDED FOR WHEEZING OR SHORTNESS OF BREATH   albuterol (VENTOLIN HFA) 108 (90 Base) MCG/ACT inhaler INHALE 1 TO 2 PUFFS BY MOUTH DAILY   BREO ELLIPTA 100-25 MCG/ACT AEPB TAKE 1 PUFF BY MOUTH EVERY DAY   brexpiprazole (REXULTI) 0.25 MG TABS tablet Take 1 tablet (0.25 mg total) by mouth daily.   buPROPion ER (WELLBUTRIN SR) 100 MG 12 hr tablet Take 1 tablet (100 mg total) by mouth daily with breakfast.   cyclobenzaprine (FLEXERIL) 10 MG tablet Take 10 mg by mouth 3 (three) times daily as needed for muscle spasms.   DULoxetine (CYMBALTA) 20 MG capsule TAKE 1 CAPSULE (20 MG TOTAL) BY MOUTH DAILY. TAKE ALONG WITH 60 MG DAILY , TOTAL OF 80 MG   DULoxetine (CYMBALTA) 60 MG capsule TAKE 1 CAPSULE BY MOUTH EVERY DAY, take along with 20 mg daily   gabapentin (NEURONTIN) 300 MG capsule Take one pill (3067m every morning and 2 pills (60067min the evening   gabapentin (NEURONTIN) 600 MG tablet Take 600 mg by mouth 3 (three) times daily.   losartan (COZAAR) 50 MG tablet Take 1 tablet (50 mg total) by mouth daily.   meloxicam (MOBIC) 7.5 MG tablet TAKE 1 TABLET BY MOUTH EVERY DAY   omeprazole (PRILOSEC) 40 MG capsule Take 1 capsule (40 mg total) by mouth daily.   rosuvastatin (CRESTOR) 20 MG tablet Take 1 tablet (20 mg total) by mouth daily.   Semaglutide, 1 MG/DOSE, (OZEMPIC, 1 MG/DOSE,) 4  MG/3ML SOPN Inject 1 mg as directed once a week.   topiramate (TOPAMAX) 100 MG tablet Take by mouth.   propranolol ER (INDERAL LA) 60 MG 24 hr capsule Take 60 mg by mouth daily. (Patient not taking: Reported on 11/18/2022)   Facility-Administered Encounter Medications as of 11/18/2022  Medication   medroxyPROGESTERone (DEPO-PROVERA) injection 150 mg    Allergies (verified) Azithromycin, Codeine, and Penicillins   History: Past Medical History:  Diagnosis Date   Allergic rhinitis    Anemia    Anxiety    Anxiety and depression    Carpal tunnel syndrome    COVID-19 01/15/2020    Depression    Fibroids    GERD (gastroesophageal reflux disease)    Headache(784.0)    Headache(784.0)    Heavy periods    Hyperthyroidism    right portion of thyroid removed   Muscle spasm    Neurological disorder    evaluation for ms   Painful menstrual periods    Shortness of breath    when i smoke a lot   Past Surgical History:  Procedure Laterality Date   BREAST BIOPSY Left    neg   carpel tunnel Left 2017   CESAREAN SECTION     times 2   CHOLECYSTECTOMY     THYROID LOBECTOMY     Family History  Problem Relation Age of Onset   Osteoporosis Mother    Breast cancer Mother 62   Diabetes Father    Anxiety disorder Father    Depression Father    Post-traumatic stress disorder Father    Heart disease Father    Lung cancer Father    Anxiety disorder Sister    Alcohol abuse Brother    Colon cancer Neg Hx    Ovarian cancer Neg Hx    Social History   Socioeconomic History   Marital status: Legally Separated    Spouse name: Not on file   Number of children: 2   Years of education: Not on file   Highest education level: High school graduate  Occupational History   Not on file  Tobacco Use   Smoking status: Every Day    Packs/day: 1.00    Years: 25.00    Total pack years: 25.00    Types: Cigarettes    Start date: 05/22/1986   Smokeless tobacco: Never  Vaping Use   Vaping Use: Never used  Substance and Sexual Activity   Alcohol use: No    Alcohol/week: 0.0 standard drinks of alcohol   Drug use: No   Sexual activity: Yes    Partners: Male    Birth control/protection: Surgical, Injection  Other Topics Concern   Not on file  Social History Narrative   Not on file   Social Determinants of Health   Financial Resource Strain: Low Risk  (11/18/2022)   Overall Financial Resource Strain (CARDIA)    Difficulty of Paying Living Expenses: Not hard at all  Food Insecurity: No Food Insecurity (11/18/2022)   Hunger Vital Sign    Worried About Running Out of Food  in the Last Year: Never true    Ran Out of Food in the Last Year: Never true  Transportation Needs: No Transportation Needs (11/18/2022)   PRAPARE - Hydrologist (Medical): No    Lack of Transportation (Non-Medical): No  Physical Activity: Insufficiently Active (11/18/2022)   Exercise Vital Sign    Days of Exercise per Week: 2 days    Minutes of Exercise  per Session: 20 min  Stress: No Stress Concern Present (11/18/2022)   [place]    Feeling of Stress : Only a little  Social Connections: Socially Isolated (11/18/2022)   Social Connection and Isolation Panel [NHANES]    Frequency of Communication with Friends and Family: More than three times a week    Frequency of Social Gatherings with Friends and Family: Never    Attends Religious Services: Never    Printmaker: No    Attends Music therapist: Never    Marital Status: Separated    Tobacco Counseling Ready to quit: Not Answered Counseling given: Not Answered   Clinical Intake:  Pre-visit preparation completed: Yes  Pain : No/denies pain     Nutritional Risks: None Diabetes: Yes CBG done?: No Did pt. bring in CBG monitor from home?: No  How often do you need to have someone help you when you read instructions, pamphlets, or other written materials from your doctor or pharmacy?: 1 - Never  Diabetic?yes Nutrition Risk Assessment:  Has the patient had any N/V/D within the last 2 months?  Yes  Does the patient have any non-healing wounds?  No  Has the patient had any unintentional weight loss or weight gain?  No   Diabetes:  Is the patient diabetic?  Yes  If diabetic, was a CBG obtained today?  No  Did the patient bring in their glucometer from home?  No  How often do you monitor your CBG's? occasionally.   Financial Strains and Diabetes Management:  Are you having any  financial strains with the device, your supplies or your medication? No .  Does the patient want to be seen by Chronic Care Management for management of their diabetes?  No  Would the patient like to be referred to a Nutritionist or for Diabetic Management?  No   Diabetic Exams:  Diabetic Eye Exam: Completed no. Overdue for diabetic eye exam. Pt has been advised about the importance in completing this exam.   Diabetic Foot Exam: Completed no. Pt has been advised about the importance in completing this exam.    Interpreter Needed?: No  Information entered by :: Kirke Shaggy, LPN   Activities of Daily Living    11/18/2022    1:45 PM  In your present state of health, do you have any difficulty performing the following activities:  Hearing? 0  Vision? 0  Difficulty concentrating or making decisions? 0  Walking or climbing stairs? 1  Dressing or bathing? 0  Doing errands, shopping? 0  Preparing Food and eating ? N  Using the Toilet? N  In the past six months, have you accidently leaked urine? N  Do you have problems with loss of bowel control? N  Managing your Medications? N  Managing your Finances? N  Housekeeping or managing your Housekeeping? N    Patient Care Team: Cletis Athens, MD as PCP - General (Internal Medicine)  Indicate any recent Medical Services you may have received from other than [facility] providers in the past year (date may be approximate).     Assessment:   This is a routine wellness examination for Mary Koch.  Hearing/Vision screen Hearing Screening - Comments:: No aids Vision Screening - Comments:: No glasses  Dietary issues and exercise activities discussed: Current Exercise Habits: Home exercise routine, Type of exercise: walking, Time (Minutes): 20, Frequency (Times/Week): 2, Weekly Exercise (Minutes/Week): 40, Intensity: Mild   Goals Addressed  This Visit's Progress    DIET - EAT MORE FRUITS AND VEGETABLES         Depression  Screen    11/18/2022    1:42 PM 11/01/2022   11:13 AM 10/17/2022    1:52 PM 08/22/2022   10:14 AM 08/05/2022    4:53 PM 05/22/2022   10:07 AM 03/25/2022   11:43 AM  PHQ 2/9 Scores  PHQ - 2 Score 3 0       PHQ- 9 Score 3 6          Information is confidential and restricted. Go to Review Flowsheets to unlock data.    Fall Risk    11/18/2022    1:45 PM 02/26/2022   11:17 AM 09/27/2021   11:01 AM 09/27/2021   10:48 AM 09/10/2021    3:23 PM  Fall Risk   Falls in the past year? 0 0 0 0 0  Number falls in past yr: 0 0 0 0 0  Injury with Fall? 0 0 0 0 0  Risk for fall due to : No Fall Risks No Fall Risks  No Fall Risks No Fall Risks  Follow up Falls prevention discussed;Falls evaluation completed Falls evaluation completed  Falls evaluation completed Falls evaluation completed    FALL RISK PREVENTION PERTAINING TO THE HOME:  Any stairs in or around the home? No  If so, are there any without handrails? No  Home free of loose throw rugs in walkways, pet beds, electrical cords, etc? Yes  Adequate lighting in your home to reduce risk of falls? Yes   ASSISTIVE DEVICES UTILIZED TO PREVENT FALLS:  Life alert? No  Use of a cane, walker or w/c? No  Grab bars in the bathroom? No  Shower chair or bench in shower? No  Elevated toilet seat or a handicapped toilet? No    Cognitive Function:    09/27/2021   11:03 AM 09/27/2021   10:54 AM  MMSE - Mini Mental State Exam  Orientation to time 5 5  Orientation to Place 5 5  Registration 3 3  Attention/ Calculation 5 5  Recall 3 3  Language- name 2 objects 2 2  Language- repeat 1 1  Language- follow 3 step command 3 3  Language- read & follow direction 1 1  Write a sentence 1 1  Copy design 0 0  Total score 29 29        11/18/2022    1:46 PM 09/27/2021   11:03 AM  6CIT Screen  What Year? 0 points 0 points  What month? 0 points 0 points  What time? 0 points 0 points  Count back from 20 0 points 0 points  Months in reverse 0 points  0 points  Repeat phrase 0 points 0 points  Total Score 0 points 0 points    Immunizations Immunization History  Administered Date(s) Administered   Influenza, Quadrivalent, Recombinant, Inj, Pf 11/01/2019   Influenza,inj,Quad PF,6+ Mos 10/08/2017, 09/10/2019   Tdap 08/08/2019    TDAP status: Up to date  Flu Vaccine status: Due, Education has been provided regarding the importance of this vaccine. Advised may receive this vaccine at local pharmacy or Health Dept. Aware to provide a copy of the vaccination record if obtained from local pharmacy or Health Dept. Verbalized acceptance and understanding.  Pneumococcal vaccine status: Declined,  Education has been provided regarding the importance of this vaccine but patient still declined. Advised may receive this vaccine at local pharmacy or Health Dept. Aware  to provide a copy of the vaccination record if obtained from local pharmacy or Health Dept. Verbalized acceptance and understanding.   Covid-19 vaccine status: Declined, Education has been provided regarding the importance of this vaccine but patient still declined. Advised may receive this vaccine at local pharmacy or Health Dept.or vaccine clinic. Aware to provide a copy of the vaccination record if obtained from local pharmacy or Health Dept. Verbalized acceptance and understanding.  Qualifies for Shingles Vaccine? No   Zostavax completed No   Shingrix Completed?: No.    Education has been provided regarding the importance of this vaccine. Patient has been advised to call insurance company to determine out of pocket expense if they have not yet received this vaccine. Advised may also receive vaccine at local pharmacy or Health Dept. Verbalized acceptance and understanding.  Screening Tests Health Maintenance  Topic Date Due   Diabetic kidney evaluation - Urine ACR  Never done   Diabetic kidney evaluation - GFR measurement  05/18/2018   INFLUENZA VACCINE  03/30/2023 (Originally  07/30/2022)   COLONOSCOPY (Pts 45-46yr Insurance coverage will need to be confirmed)  11/02/2023 (Originally 01/14/2018)   Hepatitis C Screening  11/02/2023 (Originally 01/14/1991)   HIV Screening  11/02/2023 (Originally 01/15/1988)   OPHTHALMOLOGY EXAM  11/03/2023 (Originally 01/14/1983)   COVID-19 Vaccine (1) 11/18/2023 (Originally 01/14/1978)   FOOT EXAM  11/19/2023 (Originally 01/14/1983)   HEMOGLOBIN A1C  02/02/2023   Medicare Annual Wellness (AWV)  11/19/2023   PAP SMEAR-Modifier  06/06/2025   HPV VACCINES  Aged Out    Health Maintenance  Health Maintenance Due  Topic Date Due   Diabetic kidney evaluation - Urine ACR  Never done   Diabetic kidney evaluation - GFR measurement  05/18/2018    Colorectal cancer screening: Type of screening: Cologuard. Completed needs to get it done, has the kit.. Repeat every 3 years  Mammogram status: Completed 11/07/20. Repeat every year- will reschedule appt.  Lung Cancer Screening: (Low Dose CT Chest recommended if Age 49-80years, 30 pack-year currently smoking OR have quit w/in 15years.) does not qualify.    Additional Screening:  Hepatitis C Screening: does qualify; Completed no  Vision Screening: Recommended annual ophthalmology exams for early detection of glaucoma and other disorders of the eye. Is the patient up to date with their annual eye exam?  No  Who is the provider or what is the name of the office in which the patient attends annual eye exams? no If pt is not established with a provider, would they like to be referred to a provider to establish care? No .   Dental Screening: Recommended annual dental exams for proper oral hygiene  Community Resource Referral / Chronic Care Management: CRR required this visit?  No   CCM required this visit?  No      Plan:     I have personally reviewed and noted the following in the patient's chart:   Medical and social history Use of alcohol, tobacco or illicit drugs  Current  medications and supplements including opioid prescriptions. Patient is not currently taking opioid prescriptions. Functional ability and status Nutritional status Physical activity Advanced directives List of other physicians Hospitalizations, surgeries, and ER visits in previous 12 months Vitals Screenings to include cognitive, depression, and falls Referrals and appointments  In addition, I have reviewed and discussed with patient certain preventive protocols, quality metrics, and best practice recommendations. A written personalized care plan for preventive services as well as general preventive health recommendations were provided to  patient.     Dionisio David, LPN   [id number]   Nurse Notes: none

## 2022-11-18 NOTE — Patient Instructions (Signed)
Mary Koch , Thank you for taking time to come for your Medicare Wellness Visit. I appreciate your ongoing commitment to your health goals. Please review the following plan we discussed and let me know if I can assist you in the future.   Screening recommendations/referrals: Colonoscopy: has Cologuard kit, need to complete Mammogram: 11/07/20, had appointment that was missed, needs to rechedule Recommended yearly ophthalmology/optometry visit for glaucoma screening and checkup Recommended yearly dental visit for hygiene and checkup  Vaccinations: Influenza vaccine: n/d Pneumococcal vaccine: n/d Tdap vaccine: 08/08/19 Shingles vaccine: n/d  Covid-19: n/d  Advanced directives: no  Conditions/risks identified: none  Next appointment: Follow up in one year for your annual wellness visit. 11/20/23 @ 10 am by phone  Preventive Care 40-64 Years, Female Preventive care refers to lifestyle choices and visits with your health care provider that can promote health and wellness. What does preventive care include? A yearly physical exam. This is also called an annual well check. Dental exams once or twice a year. Routine eye exams. Ask your health care provider how often you should have your eyes checked. Personal lifestyle choices, including: Daily care of your teeth and gums. Regular physical activity. Eating a healthy diet. Avoiding tobacco and drug use. Limiting alcohol use. Practicing safe sex. Taking low-dose aspirin daily starting at age 20. Taking vitamin and mineral supplements as recommended by your health care provider. What happens during an annual well check? The services and screenings done by your health care provider during your annual well check will depend on your age, overall health, lifestyle risk factors, and family history of disease. Counseling  Your health care provider may ask you questions about your: Alcohol use. Tobacco use. Drug use. Emotional well-being. Home  and relationship well-being. Sexual activity. Eating habits. Work and work Statistician. Method of birth control. Menstrual cycle. Pregnancy history. Screening  You may have the following tests or measurements: Height, weight, and BMI. Blood pressure. Lipid and cholesterol levels. These may be checked every 5 years, or more frequently if you are over 100 years old. Skin check. Lung cancer screening. You may have this screening every year starting at age 28 if you have a 30-pack-year history of smoking and currently smoke or have quit within the past 15 years. Fecal occult blood test (FOBT) of the stool. You may have this test every year starting at age 82. Flexible sigmoidoscopy or colonoscopy. You may have a sigmoidoscopy every 5 years or a colonoscopy every 10 years starting at age 71. Hepatitis C blood test. Hepatitis B blood test. Sexually transmitted disease (STD) testing. Diabetes screening. This is done by checking your blood sugar (glucose) after you have not eaten for a while (fasting). You may have this done every 1-3 years. Mammogram. This may be done every 1-2 years. Talk to your health care provider about when you should start having regular mammograms. This may depend on whether you have a family history of breast cancer. BRCA-related cancer screening. This may be done if you have a family history of breast, ovarian, tubal, or peritoneal cancers. Pelvic exam and Pap test. This may be done every 3 years starting at age 15. Starting at age 73, this may be done every 5 years if you have a Pap test in combination with an HPV test. Bone density scan. This is done to screen for osteoporosis. You may have this scan if you are at high risk for osteoporosis. Discuss your test results, treatment options, and if necessary, the need for  more tests with your health care provider. Vaccines  Your health care provider may recommend certain vaccines, such as: Influenza vaccine. This is  recommended every year. Tetanus, diphtheria, and acellular pertussis (Tdap, Td) vaccine. You may need a Td booster every 10 years. Zoster vaccine. You may need this after age 68. Pneumococcal 13-valent conjugate (PCV13) vaccine. You may need this if you have certain conditions and were not previously vaccinated. Pneumococcal polysaccharide (PPSV23) vaccine. You may need one or two doses if you smoke cigarettes or if you have certain conditions. Talk to your health care provider about which screenings and vaccines you need and how often you need them. This information is not intended to replace advice given to you by your health care provider. Make sure you discuss any questions you have with your health care provider. Document Released: 01/12/2016 Document Revised: 09/04/2016 Document Reviewed: 10/17/2015 Elsevier Interactive Patient Education  2017 [employer] [place] Prevention in the Home Falls can cause injuries. They can happen to people of all ages. There are many things you can do to make your home safe and to help prevent falls. What can I do on the outside of my home? Regularly fix the edges of walkways and driveways and fix any cracks. Remove anything that might make you trip as you walk through a door, such as a raised step or threshold. Trim any bushes or trees on the path to your home. Use bright outdoor lighting. Clear any walking paths of anything that might make someone trip, such as rocks or tools. Regularly check to see if handrails are loose or broken. Make sure that both sides of any steps have handrails. Any raised decks and porches should have guardrails on the edges. Have any leaves, snow, or ice cleared regularly. Use sand or salt on walking paths during winter. Clean up any spills in your garage right away. This includes oil or grease spills. What can I do in the bathroom? Use night lights. Install grab bars by the toilet and in the tub and shower. Do not use  towel bars as grab bars. Use non-skid mats or decals in the tub or shower. If you need to sit down in the shower, use a plastic, non-slip stool. Keep the floor dry. Clean up any water that spills on the floor as soon as it happens. Remove soap buildup in the tub or shower regularly. Attach bath mats securely with double-sided non-slip rug tape. Do not have throw rugs and other things on the floor that can make you trip. What can I do in the bedroom? Use night lights. Make sure that you have a light by your bed that is easy to reach. Do not use any sheets or blankets that are too big for your bed. They should not hang down onto the floor. Have a firm chair that has side arms. You can use this for support while you get dressed. Do not have throw rugs and other things on the floor that can make you trip. What can I do in the kitchen? Clean up any spills right away. Avoid walking on wet floors. Keep items that you use a lot in easy-to-reach places. If you need to reach something above you, use a strong step stool that has a grab bar. Keep electrical cords out of the way. Do not use floor polish or wax that makes floors slippery. If you must use wax, use non-skid floor wax. Do not have throw rugs and other things  on the floor that can make you trip. What can I do with my stairs? Do not leave any items on the stairs. Make sure that there are handrails on both sides of the stairs and use them. Fix handrails that are broken or loose. Make sure that handrails are as long as the stairways. Check any carpeting to make sure that it is firmly attached to the stairs. Fix any carpet that is loose or worn. Avoid having throw rugs at the top or bottom of the stairs. If you do have throw rugs, attach them to the floor with carpet tape. Make sure that you have a light switch at the top of the stairs and the bottom of the stairs. If you do not have them, ask someone to add them for you. What else can I do to  help prevent falls? Wear shoes that: Do not have high heels. Have rubber bottoms. Are comfortable and fit you well. Are closed at the toe. Do not wear sandals. If you use a stepladder: Make sure that it is fully opened. Do not climb a closed stepladder. Make sure that both sides of the stepladder are locked into place. Ask someone to hold it for you, if possible. Clearly mark and make sure that you can see: Any grab bars or handrails. First and last steps. Where the edge of each step is. Use tools that help you move around (mobility aids) if they are needed. These include: Canes. Walkers. Scooters. Crutches. Turn on the lights when you go into a dark area. Replace any light bulbs as soon as they burn out. Set up your furniture so you have a clear path. Avoid moving your furniture around. If any of your floors are uneven, fix them. If there are any pets around you, be aware of where they are. Review your medicines with your doctor. Some medicines can make you feel dizzy. This can increase your chance of falling. Ask your doctor what other things that you can do to help prevent falls. This information is not intended to replace advice given to you by your health care provider. Make sure you discuss any questions you have with your health care provider. Document Released: 10/12/2009 Document Revised: 05/23/2016 Document Reviewed: 01/20/2015 Elsevier Interactive Patient Education  2017 [employer].

## 2022-11-23 ENCOUNTER — Other Ambulatory Visit: Payer: Self-pay | Admitting: Internal Medicine

## 2022-11-24 ENCOUNTER — Other Ambulatory Visit: Payer: Self-pay | Admitting: Internal Medicine

## 2022-11-25 NOTE — Telephone Encounter (Signed)
Not sure if dose needs to be adjusted.

## 2022-12-02 ENCOUNTER — Ambulatory Visit (INDEPENDENT_AMBULATORY_CARE_PROVIDER_SITE_OTHER): Payer: Medicare HMO | Admitting: Licensed Clinical Social Worker

## 2022-12-02 DIAGNOSIS — F3342 Major depressive disorder, recurrent, in full remission: Secondary | ICD-10-CM

## 2022-12-02 DIAGNOSIS — F410 Panic disorder [episodic paroxysmal anxiety] without agoraphobia: Secondary | ICD-10-CM

## 2022-12-02 DIAGNOSIS — F4000 Agoraphobia, unspecified: Secondary | ICD-10-CM

## 2022-12-02 DIAGNOSIS — F4001 Agoraphobia with panic disorder: Secondary | ICD-10-CM

## 2022-12-02 NOTE — Progress Notes (Incomplete)
Virtual Visit via Video Note  I connected with [name] on 06/03/22 at  3:00 PM EST by a video enabled telemedicine application and verified that I am speaking with the correct person using two identifiers.  Location: Patient: home Provider: remote office [place], [place])   I discussed the limitations of evaluation and management by telemedicine and the availability of in person appointments. The patient expressed understanding and agreed to proceed.   I discussed the assessment and treatment plan with the patient. The patient was provided an opportunity to ask questions and all were answered. The patient agreed with the plan and demonstrated an understanding of the instructions.   The patient was advised to call back or seek an in-person evaluation if the symptoms worsen or if the condition fails to improve as anticipated.  I provided 20 minutes of non-face-to-face time during this encounter.   Grayden Burley R Dariona Postma, LCSW   THERAPIST PROGRESS NOTE  Session Time: 3-320p  Participation Level: Active  Behavioral Response: Neat and Well GroomedAlertAnxious and Depressed  Type of Therapy: Individual Therapy  Treatment Goals addressed: Problem: Reduce overall frequency, intensity, and duration of the anxiety so that daily functioning is not impaired per pt self report 3 out of 5 sessions documented.  Anxiety Disorder CCP Problem  1  Goal: LTG: Patient will score less than 5 on the Generalized Anxiety Disorder 7 Scale (GAD-7) Outcome: Progressing Intervention: Work with patient to identify the major components of a recent episode of anxiety: physical symptoms, major thoughts and images, and major behaviors they experienced Intervention: Encourage patient to identify triggers Intervention: Encourage self-care activities   Problem: Decrease depressive symptoms and improve levels of effective functioning-pt reports a decrease in overall depression symptoms 3 out of 5 sessions  documented. Depression CCP Problem  1  Goal: LTG: Reduce frequency, intensity, and duration of depression symptoms as evidenced by: pt self report  Outcome: Progressing Goal: STG: Chantae WILL PARTICIPATE IN AT LEAST 80% OF SCHEDULED INDIVIDUAL PSYCHOTHERAPY SESSIONS Outcome: Progressing Intervention: Encourage verbalization of feelings/concerns/expectations Intervention: REVIEW PLEASE SKILLS (TREAT PHYSICAL ILLNESS, BALANCE EATING, AVOID MOOD-ALTERING SUBSTANCES, BALANCE SLEEP AND GET EXERCISE) WITH Chrys Intervention: Perform motivational interviewing regarding physical activit  ProgressTowards Goals: Progressing  Interventions: Motivational Interviewing and Supportive  Summary: Skylin Kennerson is a 51 y.o. female who presents with iimproving symptoms related to depression and anxiety. Pt reports that she is compliant with her medication (cymbalta) and is managing symptoms well.  Allowed pt to explore and express thoughts and feelings associated with recent life situations and external stressors.Patient reports that she is still having some situational depression, but it's not more than one or two days at a time period patient reports that she uses her coping skills to pull herself out of the depression episodes. Patient reports that she is compliant with her medication. Reviewed importance of self-care, social engagement, and pushing herself outside of her comfort zone as interventions to help manage depression symptoms. Patient reflects understanding but feels that she is not quite emotionally ready to engage socially with others or to be more active.  Patient reports that she continues to have a relationship with her ex boyfriend he was also her neighbor. Patient reports that they do activities together on holidays since they share the children. Patient reports that this is a positive thing for everyone involved.  Patient reports that she is continuing to have panicky episodes at night--reviewed  panic management strategies with patient including deep breathing, and grounding exercises. Patient reflects understanding.  Patient reports that she has four baby puppies that she is currently bottle feeding--patient reports that this is keeping her very busy because the puppies have to drink milk every two hours.  Continued recommendations are as follows: self care behaviors, positive social engagements, focusing on overall work/home/life balance, and focusing on positive physical and emotional wellness.   Suicidal/Homicidal: No  Therapist Response: Pt is continuing to apply interventions learned in session into daily life situations. Pt is currently on track to meet goals utilizing interventions mentioned above. Personal growth and progress noted. Treatment to continue as indicated.   Plan: Return again in 4 weeks.  Diagnosis:  Encounter Diagnoses  Name Primary?   MDD (major depressive disorder), recurrent, in full remission ([place]) Yes   Panic disorder    Agoraphobia     Collaboration of Care: Other pt encouraged to continue care with psychiatrist of record, Dr. Ursula Alert  Patient/Guardian was advised Release of Information must be obtained prior to any record release in order to collaborate their care with an outside provider. Patient/Guardian was advised if they have not already done so to contact the registration department to sign all necessary forms in order for [place] to release information regarding their care.   Consent: Patient/Guardian gives verbal consent for treatment and assignment of benefits for services provided during this visit. Patient/Guardian expressed understanding and agreed to proceed.   [place], LCSW 12/03/2022

## 2022-12-13 ENCOUNTER — Encounter: Payer: Medicare HMO | Admitting: Nurse Practitioner

## 2022-12-16 ENCOUNTER — Ambulatory Visit (INDEPENDENT_AMBULATORY_CARE_PROVIDER_SITE_OTHER): Payer: Medicare HMO | Admitting: Dermatology

## 2022-12-16 ENCOUNTER — Telehealth: Payer: Self-pay

## 2022-12-16 VITALS — BP 131/76 | HR 92

## 2022-12-16 DIAGNOSIS — L821 Other seborrheic keratosis: Secondary | ICD-10-CM

## 2022-12-16 DIAGNOSIS — L814 Other melanin hyperpigmentation: Secondary | ICD-10-CM | POA: Diagnosis not present

## 2022-12-16 DIAGNOSIS — L820 Inflamed seborrheic keratosis: Secondary | ICD-10-CM | POA: Diagnosis not present

## 2022-12-16 DIAGNOSIS — Z808 Family history of malignant neoplasm of other organs or systems: Secondary | ICD-10-CM

## 2022-12-16 DIAGNOSIS — L578 Other skin changes due to chronic exposure to nonionizing radiation: Secondary | ICD-10-CM | POA: Diagnosis not present

## 2022-12-16 MED ORDER — OZEMPIC (1 MG/DOSE) 4 MG/3ML ~~LOC~~ SOPN: PEN_INJECTOR | SUBCUTANEOUS | 0 refills | Status: DC

## 2022-12-16 NOTE — Telephone Encounter (Signed)
MEDICATION: OZEMPIC 2.5   PHARMACY: [facility], [place], [place] [id number]  Comments:   **Let patient know to contact pharmacy at the end of the day to make sure medication is ready. **  ** Please notify patient to allow 48-72 hours to process**  **Encourage patient to contact the pharmacy for refills or they can request refills through [facility]**

## 2022-12-16 NOTE — Patient Instructions (Addendum)
Seborrheic Keratosis  What causes seborrheic keratoses? Seborrheic keratoses are harmless, common skin growths that first appear during adult life.  As time goes by, more growths appear.  Some people may develop a large number of them.  Seborrheic keratoses appear on both covered and uncovered body parts.  They are not caused by sunlight.  The tendency to develop seborrheic keratoses can be inherited.  They vary in color from skin-colored to gray, brown, or even black.  They can be either smooth or have a rough, warty surface.   Seborrheic keratoses are superficial and look as if they were stuck on the skin.  Under the microscope this type of keratosis looks like layers upon layers of skin.  That is why at times the top layer may seem to fall off, but the rest of the growth remains and re-grows.    Treatment Seborrheic keratoses do not need to be treated, but can easily be removed in the office.  Seborrheic keratoses often cause symptoms when they rub on clothing or jewelry.  Lesions can be in the way of shaving.  If they become inflamed, they can cause itching, soreness, or burning.  Removal of a seborrheic keratosis can be accomplished by freezing, burning, or surgery. If any spot bleeds, scabs, or grows rapidly, please return to have it checked, as these can be an indication of a skin cancer.  Cryotherapy Aftercare  Wash gently with soap and water everyday.   Apply Vaseline and Band-Aid daily until healed.     Melanoma ABCDEs  Melanoma is the most dangerous type of skin cancer, and is the leading cause of death from skin disease.  You are more likely to develop melanoma if you: Have light-colored skin, light-colored eyes, or red or blond hair Spend a lot of time in the sun Tan regularly, either outdoors or in a tanning bed Have had blistering sunburns, especially during childhood Have a close family member who has had a melanoma Have atypical moles or large birthmarks  Early detection  of melanoma is key since treatment is typically straightforward and cure rates are extremely high if we catch it early.   The first sign of melanoma is often a change in a mole or a new dark spot.  The ABCDE system is a way of remembering the signs of melanoma.  A for asymmetry:  The two halves do not match. B for border:  The edges of the growth are irregular. C for color:  A mixture of colors are present instead of an even brown color. D for diameter:  Melanomas are usually (but not always) greater than 33m - the size of a pencil eraser. E for evolution:  The spot keeps changing in size, shape, and color.  Please check your skin once per month between visits. You can use a small mirror in front and a large mirror behind you to keep an eye on the back side or your body.   If you see any new or changing lesions before your next follow-up, please call to schedule a visit.  Please continue daily skin protection including broad spectrum sunscreen SPF 30+ to sun-exposed areas, reapplying every 2 hours as needed when you're outdoors.    Due to recent changes in healthcare laws, you may see results of your pathology and/or laboratory studies on MyChart before the doctors have had a chance to review them. We understand that in some cases there may be results that are confusing or concerning to you. Please understand  that not all results are received at the same time and often the doctors may need to interpret multiple results in order to provide you with the best plan of care or course of treatment. Therefore, we ask that you please give [place] 2 business days to thoroughly review all your results before contacting the office for clarification. Should we see a critical lab result, you will be contacted sooner.   If You Need Anything After Your Visit  If you have any questions or concerns for your doctor, please call our main line at [phone] and press option 4 to reach your doctor's medical assistant.  If no one answers, please leave a voicemail as directed and we will return your call as soon as possible. Messages left after 4 pm will be answered the following business day.   You may also send [place] a message via [place]. We typically respond to MyChart messages within 1-2 business days.  For prescription refills, please ask your pharmacy to contact our office. Our fax number is [phone].  If you have an urgent issue when the clinic is closed that cannot wait until the next business day, you can page your doctor at the number below.    Please note that while we do our best to be available for urgent issues outside of office hours, we are not available 24/7.   If you have an urgent issue and are unable to reach [place], you may choose to seek medical care at your doctor's office, retail clinic, urgent care center, or emergency room.  If you have a medical emergency, please immediately call 911 or go to the emergency department.  Pager Numbers  - Dr. Nehemiah Massed: [phone]  - Dr. Laurence Ferrari: [phone]  - Dr. Nicole Kindred: [phone]  In the event of inclement weather, please call our main line at [phone] for an update on the status of any delays or closures.  Dermatology Medication Tips: Please keep the boxes that topical medications come in in order to help keep track of the instructions about where and how to use these. Pharmacies typically print the medication instructions only on the boxes and not directly on the medication tubes.   If your medication is too expensive, please contact our office at [phone] option 4 or send [place] a message through [place].   We are unable to tell what your co-pay for medications will be in advance as this is different depending on your insurance coverage. However, we may be able to find a substitute medication at lower cost or fill out paperwork to get insurance to cover a needed medication.   If a prior authorization is required to get your medication  covered by your insurance company, please allow [place] 1-2 business days to complete this process.  Drug prices often vary depending on where the prescription is filled and some pharmacies may offer cheaper prices.  The website www.goodrx.com contains coupons for medications through different pharmacies. The prices here do not account for what the cost may be with help from insurance (it may be cheaper with your insurance), but the website can give you the price if you did not use any insurance.  - You can print the associated coupon and take it with your prescription to the pharmacy.  - You may also stop by our office during regular business hours and pick up a GoodRx coupon card.  - If you need your prescription sent electronically to a different pharmacy, notify our office through [facility] or by  phone at [phone] option 4.     Si Usted Necesita Algo Despus de Su Visita  Tambin puede enviarnos un mensaje a travs de Pharmacist, community. Por lo general respondemos a los mensajes de MyChart en el transcurso de 1 a 2 das hbiles.  Para renovar recetas, por favor pida a su farmacia que se ponga en contacto con nuestra oficina. Harland Dingwall de fax es [place] [phone].  Si tiene un asunto urgente cuando la clnica est cerrada y que no puede esperar hasta el siguiente da hbil, puede llamar/localizar a su doctor(a) al nmero que aparece a continuacin.   Por favor, tenga en cuenta que aunque hacemos todo lo posible para estar disponibles para asuntos urgentes fuera del horario de [place], no estamos disponibles las 24 horas del da, los 7 das de la [place].   Si tiene un problema urgente y no puede comunicarse con nosotros, puede optar por buscar atencin mdica  en el consultorio de su doctor(a), en una clnica privada, en un centro de atencin urgente o en una sala de emergencias.  Si tiene Engineering geologist, por favor llame inmediatamente al 911 o vaya a la sala de  emergencias.  Nmeros de bper  - Dr. Nehemiah Massed: [phone]  - Dra. Moye: [phone]  - Dra. Nicole Kindred: [phone]  En caso de inclemencias del [place], por favor llame a Johnsie Kindred principal al [phone] para una actualizacin sobre el [place] de cualquier retraso o cierre.  Consejos para la medicacin en dermatologa: Por favor, guarde las cajas en las que vienen los medicamentos de uso tpico para ayudarle a seguir las instrucciones sobre dnde y cmo usarlos. Las farmacias generalmente imprimen las instrucciones del medicamento slo en las cajas y no directamente en los tubos del [place].   Si su medicamento es muy caro, por favor, pngase en contacto con Zigmund Daniel llamando al [phone] y presione la opcin 4 o envenos un mensaje a travs de Pharmacist, community.   No podemos decirle cul ser su copago por los medicamentos por adelantado ya que esto es diferente dependiendo de la cobertura de su seguro. Sin embargo, es posible que podamos encontrar un medicamento sustituto a Electrical engineer un formulario para que el seguro cubra el medicamento que se considera necesario.   Si se requiere una autorizacin previa para que su compaa de seguros [place] su medicamento, por favor permtanos de 1 a 2 das hbiles para completar este proceso.  Los precios de los medicamentos varan con frecuencia dependiendo del Environmental consultant de dnde se surte la receta y alguna farmacias pueden ofrecer precios ms baratos.  El sitio web www.goodrx.com tiene cupones para medicamentos de Airline pilot. Los precios aqu no tienen en cuenta lo que podra costar con la ayuda del seguro (puede ser ms barato con su seguro), pero el sitio web puede darle el precio si no utiliz Research scientist (physical sciences).  - Puede imprimir el cupn correspondiente y llevarlo con su receta a la farmacia.  - Tambin puede pasar por nuestra oficina durante el horario de atencin regular y Charity fundraiser una tarjeta de cupones de GoodRx.  - Si  necesita que su receta se enve electrnicamente a una farmacia diferente, informe a nuestra oficina a travs de MyChart de [place] o por telfono llamando al [phone] y presione la opcin 4.

## 2022-12-16 NOTE — Progress Notes (Signed)
   New Patient Visit  Subjective  Mary Koch is a 51 y.o. female who presents for the following: New Patient (Initial Visit) (Patient reports itchy brown spots at left forearm and spot at tip of nose. ). The patient has spots, moles and lesions to be evaluated, some may be new or changing and the patient has concerns that these could be cancer.  The following portions of the chart were reviewed this encounter and updated as appropriate:   Tobacco  Allergies  Meds  Problems  Med Hx  Surg Hx  Fam Hx     Review of Systems:  No other skin or systemic complaints except as noted in HPI or Assessment and Plan.  Objective  Well appearing patient in no apparent distress; mood and affect are within normal limits.  A focused examination was performed including face, bilateral arms. Relevant physical exam findings are noted in the Assessment and Plan.  left forearm x 4 (4) Erythematous stuck-on, waxy papule or plaque   Assessment & Plan  Inflamed seborrheic keratosis (4) left forearm x 4 Symptomatic, irritating, patient would like treated. Destruction of lesion - left forearm x 4 Complexity: simple   Destruction method: cryotherapy   Informed consent: discussed and consent obtained   Timeout:  patient name, date of birth, surgical site, and procedure verified Lesion destroyed using liquid nitrogen: Yes   Region frozen until ice ball extended beyond lesion: Yes   Outcome: patient tolerated procedure well with no complications   Post-procedure details: wound care instructions given   Additional details:  Prior to procedure, discussed risks of blister formation, small wound, skin dyspigmentation, or rare scar following cryotherapy. Recommend Vaseline ointment to treated areas while healing.  Seborrheic Keratoses At nose  - Stuck-on, waxy, tan-brown papules and/or plaques  - Benign-appearing - Discussed benign etiology and prognosis. - Observe - Call for any  changes  Lentigines - Scattered tan macules - Due to sun exposure - Benign-appearing, observe - Recommend daily broad spectrum sunscreen SPF 30+ to sun-exposed areas, reapply every 2 hours as needed. - Call for any changes  Actinic Damage - chronic, secondary to cumulative UV radiation exposure/sun exposure over time - diffuse scaly erythematous macules with underlying dyspigmentation - Recommend daily broad spectrum sunscreen SPF 30+ to sun-exposed areas, reapply every 2 hours as needed.  - Recommend staying in the shade or wearing long sleeves, sun glasses (UVA+UVB protection) and wide brim hats (4-inch brim around the entire circumference of the hat). - Call for new or changing lesions.  Family history of skin cancer - what type(s): not sure - who affected:dad and uncle   Return if symptoms worsen or fail to improve.  IRuthell Rummage, CMA, am acting as scribe for Sarina Ser, MD. Documentation: I have reviewed the above documentation for accuracy and completeness, and I agree with the above.  Sarina Ser, MD

## 2022-12-19 ENCOUNTER — Telehealth: Payer: Self-pay | Admitting: Nurse Practitioner

## 2022-12-19 MED ORDER — OZEMPIC (2 MG/DOSE) 8 MG/3ML ~~LOC~~ SOPN: 2.0000 mg | PEN_INJECTOR | SUBCUTANEOUS | 3 refills | Status: DC

## 2022-12-19 NOTE — Telephone Encounter (Signed)
2 mg dose was sent in.

## 2022-12-19 NOTE — Telephone Encounter (Signed)
Patient called to report her pharmacy says there is no 2.5 ozempic. Provider had wanted pt to do 2 and a half mg. Provider sent 1 MG. Pt states it should have been 2 mg. Pt says she started out on 1 mg so she does not want to back down to that.  PLEASE SEND RX FOR:   OZEMPIC 2 MG  [facility] [place], [address] AT [facility] [address] [place], [place] [address] Phone: [phone]  Fax: [phone]   Patient phone [phone]

## 2022-12-22 ENCOUNTER — Other Ambulatory Visit: Payer: Self-pay | Admitting: Nurse Practitioner

## 2022-12-24 ENCOUNTER — Encounter: Payer: Self-pay | Admitting: Psychiatry

## 2022-12-24 ENCOUNTER — Telehealth: Payer: Self-pay

## 2022-12-24 ENCOUNTER — Telehealth (INDEPENDENT_AMBULATORY_CARE_PROVIDER_SITE_OTHER): Payer: Medicare HMO | Admitting: Psychiatry

## 2022-12-24 DIAGNOSIS — F3342 Major depressive disorder, recurrent, in full remission: Secondary | ICD-10-CM | POA: Diagnosis not present

## 2022-12-24 DIAGNOSIS — F1590 Other stimulant use, unspecified, uncomplicated: Secondary | ICD-10-CM | POA: Diagnosis not present

## 2022-12-24 DIAGNOSIS — F17200 Nicotine dependence, unspecified, uncomplicated: Secondary | ICD-10-CM | POA: Diagnosis not present

## 2022-12-24 DIAGNOSIS — F4000 Agoraphobia, unspecified: Secondary | ICD-10-CM

## 2022-12-24 DIAGNOSIS — F410 Panic disorder [episodic paroxysmal anxiety] without agoraphobia: Secondary | ICD-10-CM

## 2022-12-24 DIAGNOSIS — G4701 Insomnia due to medical condition: Secondary | ICD-10-CM | POA: Diagnosis not present

## 2022-12-24 MED ORDER — VARENICLINE TARTRATE 0.5 MG PO TABS: 0.5000 mg | ORAL_TABLET | Freq: Two times a day (BID) | ORAL | 1 refills | Status: DC

## 2022-12-24 NOTE — Telephone Encounter (Signed)
PA initiated for Rexulti 0.25 mg tablets via CoverMyMeds  Approved PA case #[id number] Coverage ends 12/30/23 Pharamacy and patient made aware

## 2022-12-24 NOTE — Progress Notes (Unsigned)
Virtual Visit via Video Note  I connected with Mary Koch on 12/24/22 at  8:30 AM EST by a video enabled telemedicine application and verified that I am speaking with the correct person using two identifiers.  Location Provider Location : [facility] Patient Location : Home  Participants: Patient , Provider   I discussed the limitations of evaluation and management by telemedicine and the availability of in person appointments. The patient expressed understanding and agreed to proceed.   I discussed the assessment and treatment plan with the patient. The patient was provided an opportunity to ask questions and all were answered. The patient agreed with the plan and demonstrated an understanding of the instructions.   The patient was advised to call back or seek an in-person evaluation if the symptoms worsen or if the condition fails to improve as anticipated.    [place] MD OP Progress Note  12/25/2022 7:54 AM [place]  MRN:  [id number]  Chief Complaint:  Chief Complaint  Patient presents with   Follow-up   Medication Refill   Anxiety   Depression   HPI: Mary Koch is a 51 year old Caucasian female, lives in [place], legally separated,has a history of MDD, panic disorder, agoraphobia, tobacco use disorder, caffeine use disorder was evaluated by telemedicine today.  Patient today reports she is currently struggling with sleep.  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  Patient reports she is trying to catch up on her nap during the day.  That helps to some extent.  Reports she spent Christmas with her family including her mother.  It was not the same as previous years due to deaths in the family including her father.  Patient however reports she had a good time.  She reports overall mood symptoms are stable.  Would like to cut back on smoking and is agreeable to trial of Chantix.  Tried it in the past and that may have  helped.  Continues to use a lot of caffeine and has been unable to cut back.  However motivated to do so.  Currently compliant on medications like Rexulti, Cymbalta, Wellbutrin.  Denies side effects.  Denies any suicidality, homicidality or perceptual disturbances.  Patient denies any other concerns today.  Visit Diagnosis:    ICD-10-CM   1. MDD (major depressive disorder), recurrent, in full remission ([place])  F33.42     2. Panic disorder  F41.0     3. Agoraphobia  F40.00     4. Insomnia due to medical condition  G47.01    lack of sleep hygiene, OSA    5. Tobacco use disorder  F17.200 varenicline (CHANTIX) 0.5 MG tablet    6. 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  Past trials of Paxil, Luvox, Seroquel, Wellbutrin, Ambien, Klonopin.  Past Medical History:  Past Medical History:  Diagnosis Date   Allergic rhinitis    Anemia    Anxiety    Anxiety and depression    Carpal tunnel syndrome    COVID-19 01/15/2020   Depression    Fibroids    GERD (gastroesophageal reflux disease)    Headache(784.0)    Headache(784.0)    Heavy periods    Hyperthyroidism    right portion of thyroid removed   Muscle spasm    Neurological disorder    evaluation for ms   Painful  menstrual periods    Shortness of breath    when i smoke a lot    Past Surgical History:  Procedure Laterality Date   BREAST BIOPSY Left    neg   carpel tunnel Left 2017   CESAREAN SECTION     times 2   CHOLECYSTECTOMY     THYROID LOBECTOMY      Family Psychiatric History: Reviewed family psychiatric history from progress note on 03/24/2018.  Family History:  Family History  Problem Relation Age of Onset   Osteoporosis Mother    Breast cancer Mother 46   Diabetes Father    Anxiety disorder Father    Depression Father    Post-traumatic stress disorder Father    Heart disease Father    Lung cancer Father     Anxiety disorder Sister    Alcohol abuse Brother    Colon cancer Neg Hx    Ovarian cancer Neg Hx     Social History: Reviewed social history from progress note on 03/24/2018. Social History   Socioeconomic History   Marital status: Legally Separated    Spouse name: Not on file   Number of children: 2   Years of education: Not on file   Highest education level: High school graduate  Occupational History   Not on file  Tobacco Use   Smoking status: Every Day    Packs/day: 1.00    Years: 25.00    Total pack years: 25.00    Types: Cigarettes    Start date: 05/22/1986   Smokeless tobacco: Never  Vaping Use   Vaping Use: Never used  Substance and Sexual Activity   Alcohol use: No    Alcohol/week: 0.0 standard drinks of alcohol   Drug use: No   Sexual activity: Yes    Partners: Male    Birth control/protection: Surgical, Injection  Other Topics Concern   Not on file  Social History Narrative   Not on file   Social Determinants of Health   Financial Resource Strain: Low Risk  (11/18/2022)   Overall Financial Resource Strain (CARDIA)    Difficulty of Paying Living Expenses: Not hard at all  Food Insecurity: No Food Insecurity (11/18/2022)   Hunger Vital Sign    Worried About Running Out of Food in the Last Year: Never true    [place] in the Last Year: Never true  Transportation Needs: No Transportation Needs (11/18/2022)   PRAPARE - Hydrologist (Medical): No    Lack of Transportation (Non-Medical): No  Physical Activity: Insufficiently Active (11/18/2022)   Exercise Vital Sign    Days of Exercise per Week: 2 days    Minutes of Exercise per Session: 20 min  Stress: No Stress Concern Present (11/18/2022)   [place]    Feeling of Stress : Only a little  Social Connections: Socially Isolated (11/18/2022)   Social Connection and Isolation Panel [NHANES]    Frequency of  Communication with Friends and Family: More than three times a week    Frequency of Social Gatherings with Friends and Family: Never    Attends Religious Services: Never    Marine scientist or Organizations: No    Attends Archivist Meetings: Never    Marital Status: Separated    Allergies:  Allergies  Allergen Reactions   Azithromycin Other (See Comments)    headache   Codeine Other (See Comments)    unknown  Penicillins Other (See Comments)    headaches    Metabolic Disorder Labs: Lab Results  Component Value Date   HGBA1C 5.0 08/02/2022   Lab Results  Component Value Date   PROLACTIN 4.3 03/29/2022   Lab Results  Component Value Date   CHOL 155 03/29/2022   TRIG 419 (H) 03/29/2022   HDL 26 (L) 03/29/2022   CHOLHDL 6.0 (H) 03/29/2022   [place]  03/29/2022     Comment:     . LDL cholesterol not calculated. Triglyceride levels greater than 400 mg/dL invalidate calculated LDL results. . Reference range: <100 . Desirable range <100 mg/dL for primary prevention;   <70 mg/dL for patients with CHD or diabetic patients  with > or = 2 CHD risk factors. Marland Kitchen LDL-C is now calculated using the Martin-Hopkins  calculation, which is a validated novel method providing  better accuracy than the Friedewald equation in the  estimation of LDL-C.  Cresenciano Genre et al. Annamaria Helling. 2952;841(32): 2061-2068  (http://education.[employer].com/faq/FAQ164)    Lab Results  Component Value Date   TSH 1.438 09/06/2021    Therapeutic Level Labs: No results found for: "LITHIUM" No results found for: "VALPROATE" No results found for: "CBMZ"  Current Medications: Current Outpatient Medications  Medication Sig Dispense Refill   albuterol (PROVENTIL) (2.5 MG/3ML) 0.083% nebulizer solution INHALE 3 ML BY NEBULIZATION EVERY 6 HOURS AS NEEDED FOR WHEEZING OR SHORTNESS OF BREATH 150 mL 1   albuterol (VENTOLIN HFA) 108 (90 Base) MCG/ACT inhaler INHALE 1 TO 2 PUFFS BY MOUTH DAILY  6.7 each 5   BREO ELLIPTA 100-25 MCG/ACT AEPB TAKE 1 PUFF BY MOUTH EVERY DAY 60 each 4   brexpiprazole (REXULTI) 0.25 MG TABS tablet Take 1 tablet (0.25 mg total) by mouth daily. 90 tablet 1   buPROPion ER (WELLBUTRIN SR) 100 MG 12 hr tablet Take 1 tablet (100 mg total) by mouth daily with breakfast. 90 tablet 1   cyclobenzaprine (FLEXERIL) 10 MG tablet Take 10 mg by mouth 3 (three) times daily as needed for muscle spasms.     DULoxetine (CYMBALTA) 20 MG capsule TAKE 1 CAPSULE (20 MG TOTAL) BY MOUTH DAILY. TAKE ALONG WITH 60 MG DAILY , TOTAL OF 80 MG 90 capsule 0   DULoxetine (CYMBALTA) 60 MG capsule TAKE 1 CAPSULE BY MOUTH EVERY DAY, take along with 20 mg daily 90 capsule 0   gabapentin (NEURONTIN) 300 MG capsule Take one pill ('300mg'$ ) every morning and 2 pills ('600mg'$ ) in the evening     losartan (COZAAR) 50 MG tablet Take 1 tablet (50 mg total) by mouth daily. 30 tablet 1   meloxicam (MOBIC) 7.5 MG tablet TAKE 1 TABLET BY MOUTH EVERY DAY 30 tablet 3   omeprazole (PRILOSEC) 40 MG capsule Take 1 capsule (40 mg total) by mouth daily. 90 capsule 3   rosuvastatin (CRESTOR) 20 MG tablet Take 1 tablet (20 mg total) by mouth daily. 90 tablet 3   Semaglutide, 2 MG/DOSE, (OZEMPIC, 2 MG/DOSE,) 8 MG/3ML SOPN Inject 2 mg into the skin once a week. 9 mL 3   topiramate (TOPAMAX) 100 MG tablet Take by mouth.     varenicline (CHANTIX) 0.5 MG tablet Take 1 tablet (0.5 mg total) by mouth 2 (two) times daily. 60 tablet 1   Current Facility-Administered Medications  Medication Dose Route Frequency Provider Last Rate Last Admin   medroxyPROGESTERone (DEPO-PROVERA) injection 150 mg  150 mg Intramuscular Q90 days Rubie Maid, MD   150 mg at 06/10/22 1558     Musculoskeletal:  Strength & Muscle Tone:  Seated Gait & Station: normal Patient leans: N/A  Psychiatric Specialty Exam: Review of Systems  Psychiatric/Behavioral:  Positive for sleep disturbance.   All other systems reviewed and are negative.   There  were no vitals taken for this visit.There is no height or weight on file to calculate BMI.  General Appearance: Casual  Eye Contact:  Fair  Speech:  Clear and Coherent  Volume:  Normal  Mood:  Euthymic  Affect:  Congruent  Thought Process:  Goal Directed and Descriptions of Associations: Intact  Orientation:  Full (Time, Place, and Person)  Thought Content: Logical   Suicidal Thoughts:  No  Homicidal Thoughts:  No  Memory:  Immediate;   Fair Recent;   Fair Remote;   Fair  Judgement:  Fair  Insight:  Fair  Psychomotor Activity:  Normal  Concentration:  Concentration: Fair and Attention Span: Fair  Recall:  [employer] of Knowledge: Fair  Language: Fair  Akathisia:  No  Handed:  Right  AIMS (if indicated): not done  Assets:  Communication Skills Desire for Improvement Housing Social Support Transportation  ADL's:  Intact  Cognition: WNL  Sleep:   poor due to taking care of puppies   Screenings: AIMS    Flowsheet Row Video Visit from 08/22/2022 in [facility] Video Visit from 05/22/2022 in [place] Total Score 0 0      AUDIT    [place] Visit from 05/22/2016 in [place]  Alcohol Use Disorder Identification Test Final Score (AUDIT) 0      GAD-7    Flowsheet Row Video Visit from 08/22/2022 in [facility] Counselor from 03/05/2022 in [facility] from 10/06/2020 in [place] Visit from 08/10/2019 in [facility]  Total GAD-7 Score '11 20 15 14      '$ [place] from 09/27/2021 in [facility]  Total Score (max 30 points ) 29      PHQ2-9    Flowsheet Row Video Visit from 12/24/2022 in [facility] from 11/18/2022 in [place] Office Visit from  11/01/2022 in [place] Video Visit from 10/17/2022 in [place] Video Visit from 08/22/2022 in [facility]  PHQ-2 Total Score 2 3 0 0 1  PHQ-9 Total Score '9 3 6 '$ -- --      Flowsheet Row Video Visit from 12/24/2022 in [place] Video Visit from 10/17/2022 in [place] Video Visit from 08/22/2022 in [place] Low Risk Low Risk Low Risk        Assessment and Plan: Mary Koch is a 51 year old Caucasian female who has a history of MDD, panic attack, tobacco use disorder, caffeine use disorder, insomnia was evaluated by telemedicine today.  Patient currently struggling with sleep due to taking care of puppies at night.  Patient interested in smoking cessation, discussed plan as noted below.  Plan MDD in remission Cymbalta 80 mg p.o. daily Wellbutrin SR 100 mg p.o. daily in the morning Rexulti 0.25 mg p.o. daily-long-term plan to taper off. PHQ-9 slightly elevated today however it is due to her chronic fatigue as well as her sleep problems which is due to lack of sleep hygiene.  Will not make any medication changes today for the  same. Continue CBT with Ms. Christina Hussami, has upcoming appointment.  Panic disorder-improving Cymbalta 80 mg p.o. daily Gabapentin as prescribed per neurology Continue CBT  Insomnia-unstable Patient to work on sleep hygiene techniques  Agoraphobia-stable Continue CBT  Tobacco use disorder-unstable Provided counseling for 5 minutes. Start Chantix 0.5 mg p.o. twice daily Provided medication education, discussed side effects.  Caffeine use disorder-unstable Provided counseling  Follow-up in clinic in 2 to 3 months or sooner if needed.   Collaboration of Care: Collaboration of Care: Referral or follow-up with counselor/therapist AEB encouraged to continue to  follow-up with therapist.  Patient/Guardian was advised Release of Information must be obtained prior to any record release in order to collaborate their care with an outside provider. Patient/Guardian was advised if they have not already done so to contact the registration department to sign all necessary forms in order for [place] to release information regarding their care.   Consent: Patient/Guardian gives verbal consent for treatment and assignment of benefits for services provided during this visit. Patient/Guardian expressed understanding and agreed to proceed.   This note was generated in part or whole with voice recognition software. Voice recognition is usually quite accurate but there are transcription errors that can and very often do occur. I apologize for any typographical errors that were not detected and corrected.      Ursula Alert, MD 12/25/2022, 7:54 AM

## 2022-12-27 ENCOUNTER — Encounter: Payer: Self-pay | Admitting: Dermatology

## 2022-12-31 ENCOUNTER — Other Ambulatory Visit: Payer: Self-pay | Admitting: Psychiatry

## 2022-12-31 DIAGNOSIS — F3342 Major depressive disorder, recurrent, in full remission: Secondary | ICD-10-CM

## 2022-12-31 DIAGNOSIS — F410 Panic disorder [episodic paroxysmal anxiety] without agoraphobia: Secondary | ICD-10-CM

## 2023-01-12 ENCOUNTER — Other Ambulatory Visit: Payer: Self-pay | Admitting: Internal Medicine

## 2023-01-12 DIAGNOSIS — F17200 Nicotine dependence, unspecified, uncomplicated: Secondary | ICD-10-CM

## 2023-01-15 ENCOUNTER — Ambulatory Visit (INDEPENDENT_AMBULATORY_CARE_PROVIDER_SITE_OTHER): Payer: Medicare HMO | Admitting: Licensed Clinical Social Worker

## 2023-01-15 DIAGNOSIS — F3342 Major depressive disorder, recurrent, in full remission: Secondary | ICD-10-CM | POA: Diagnosis not present

## 2023-01-15 DIAGNOSIS — F4000 Agoraphobia, unspecified: Secondary | ICD-10-CM
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
## 2023-01-22 ENCOUNTER — Other Ambulatory Visit: Payer: Self-pay | Admitting: Psychiatry

## 2023-01-22 DIAGNOSIS — F17200 Nicotine dependence, unspecified, uncomplicated: Secondary | ICD-10-CM

## 2023-01-26 ENCOUNTER — Other Ambulatory Visit: Payer: Self-pay | Admitting: Internal Medicine

## 2023-01-31 ENCOUNTER — Other Ambulatory Visit: Payer: Self-pay | Admitting: Psychiatry

## 2023-01-31 DIAGNOSIS — F3342 Major depressive disorder, recurrent, in full remission: Secondary | ICD-10-CM

## 2023-02-01 ENCOUNTER — Other Ambulatory Visit: Payer: Self-pay | Admitting: Psychiatry

## 2023-02-01 DIAGNOSIS — F410 Panic disorder [episodic paroxysmal anxiety] without agoraphobia: Secondary | ICD-10-CM

## 2023-02-01 DIAGNOSIS — F3342 Major depressive disorder, recurrent, in full remission: Secondary | ICD-10-CM

## 2023-02-04 DIAGNOSIS — G5603 Carpal tunnel syndrome, bilateral upper limbs: Secondary | ICD-10-CM | POA: Diagnosis not present

## 2023-02-09 ENCOUNTER — Other Ambulatory Visit: Payer: Self-pay | Admitting: Psychiatry

## 2023-02-09 DIAGNOSIS — F410 Panic disorder [episodic paroxysmal anxiety] without agoraphobia: Secondary | ICD-10-CM

## 2023-02-10 ENCOUNTER — Ambulatory Visit (INDEPENDENT_AMBULATORY_CARE_PROVIDER_SITE_OTHER): Payer: Medicare HMO

## 2023-02-10 VITALS — BP 126/74 | HR 85 | Resp 16 | Ht 66.5 in | Wt 229.7 lb

## 2023-02-10 DIAGNOSIS — Z3042 Encounter for surveillance of injectable contraceptive: Secondary | ICD-10-CM | POA: Diagnosis not present

## 2023-02-10 MED ORDER — MEDROXYPROGESTERONE ACETATE 150 MG/ML IM SUSP
150.0000 mg | Freq: Once | INTRAMUSCULAR | Status: AC
  Administered 2023-02-10: 150 mg via INTRAMUSCULAR

## 2023-02-10 NOTE — Patient Instructions (Signed)

## 2023-02-10 NOTE — Progress Notes (Signed)
    NURSE VISIT NOTE  Subjective:    Patient ID: Mary Koch, female    DOB: August 14, 1971, 52 y.o.   MRN: [id number]  HPI  Patient is a 52 y.o. G75P2002 female who presents for depo provera injection.   Objective:    BP 126/74   Pulse 85   Resp 16   Ht 5' 6.5" (1.689 m)   Wt 229 lb 11.2 oz (104.2 kg)   BMI 36.52 kg/m   Last Annual: 08/10/2019. Last pap: 06/06/2022. Last Depo-Provera: 11/18/2022. Side Effects if any: She had 3 cycles last month. Each cycle lasted about 3 days. Serum HCG indicated? No . Depo-Provera 150 mg IM given by: Cristy Folks, CMA.  Lab Review  '@THIS'$  VISIT ONLY@  Assessment:   1. Encounter for surveillance of injectable contraceptive      Plan:   Next appointment due between April 30 and May 14.    Cristy Folks, [place]

## 2023-02-17 ENCOUNTER — Ambulatory Visit (INDEPENDENT_AMBULATORY_CARE_PROVIDER_SITE_OTHER): Payer: Medicare HMO | Admitting: Licensed Clinical Social Worker

## 2023-02-17 DIAGNOSIS — F4000 Agoraphobia, unspecified: Secondary | ICD-10-CM | POA: Diagnosis not present

## 2023-02-17 DIAGNOSIS — F331 Major depressive disorder, recurrent, moderate: Secondary | ICD-10-CM | POA: Diagnosis not present

## 2023-02-17 NOTE — Progress Notes (Unsigned)
Virtual Visit via Video Note  I connected with Mary Koch on 06/03/22 at  2:00 PM EST by a video enabled telemedicine application and verified that I am speaking with the correct person using two identifiers.  Location: Patient: home Provider: remote office [place], [place])   I discussed the limitations of evaluation and management by telemedicine and the availability of in person appointments. The patient expressed understanding and agreed to proceed.   I discussed the assessment and treatment plan with the patient. The patient was provided an opportunity to ask questions and all were answered. The patient agreed with the plan and demonstrated an understanding of the instructions.   The patient was advised to call back or seek an in-person evaluation if the symptoms worsen or if the condition fails to improve as anticipated.  I provided 20 minutes of non-face-to-face time during this encounter.   [place], LCSW   THERAPIST PROGRESS NOTE  Session Time: 2-220p  Participation Level: Active  Behavioral Response: Neat and Well GroomedAlertAnxious and Depressed  Type of Therapy: Individual Therapy  Treatment Goals addressed: Develop healthy thinking patterns and beliefs about self, others, and the world that lead to the alleviation and help prevent the relapse of depression per self report 3 out of 5 sessions documented   Learn and implement coping skills that result in a reduction of anxiety and worry, and improve daily functioning per pt report 3 out of 5 sessions documented   [place] will demonstrate interest in social activities by initiating/joining social activity without prompts  ProgressTowards Goals: Progressing  Interventions: Motivational Interviewing and Supportive  Summary: Mary Koch is a 52 y.o. female who presents with iimproving symptoms related to depression and anxiety. Pt reports that she is compliant with her medication (cymbalta) and is  managing symptoms well.  Allowed pt to explore and express thoughts and feelings associated with recent life situations and external stressors.  Patient reports that overall mood is stable--patient rated her depression on a scale of 1-10 with 10 being the most depression symptoms a 6.  Patient rated her anxiety on a scale of 1-10 with 10 being the most anxious at a 4.  Allowed patient to identify current coping skills that she is using to manage depression symptoms and manage anxiety symptoms.  Patient reports that she is spending most of her time in the home--patient states that she does go out if she needs to get something from the store, but she often will choose a time when there is not a lot of people around intentionally so that she does not have to be around people.  Patient reports that she is around her mom, her son, and her son's father.  Allow patient to explore and identify specific anxiety triggers when patient does leave the home, or is around individuals other than family.  Encouraged patient to use her coping skills to manage any symptoms of anxiety that may be triggered when she is in an anxious situation.  Patient states that she still has puppies in her home, and has not given any of the puppies away at time of session.  Patient expressed worry about finding the puppies a good home.  Patient made the statement that she finds joy and happiness when she spends time with the puppies.  Patient reports she still experiences chronic fatigue at times, and chronic pain.  Encouraged patient to have a good balance of rest and activity to manage those symptoms.  Patient denies any recent panic  attacks, no additional external stressors, patient denies any recent negative thoughts about herself, patient denies any recent relationship conflicts, and patient denies any current substance use.  Continued recommendations are as follows: self care behaviors, positive social engagements, focusing on  overall work/home/life balance, and focusing on positive physical and emotional wellness.   Suicidal/Homicidal: No  Therapist Response: Pt is continuing to apply interventions learned in session into daily life situations. Pt is currently on track to meet goals utilizing interventions mentioned above. Personal growth and progress noted. Treatment to continue as indicated.   Continued to encourage patient to reach out and go places beyond her comfort zone, and to manage any anxiety symptoms that are triggered from this.  Discussed the difference between dislike and phobia.  Patient is demonstrating ability to take steps to push herself outside of her comfort zone at times.  Plan: Return again in 4 weeks.  Diagnosis:  Encounter Diagnoses  Name Primary?   MDD (major depressive disorder), recurrent episode, moderate ([place]) Yes   Agoraphobia    Collaboration of Care: Other pt encouraged to continue care with psychiatrist of record, Dr. Ursula Alert  Patient/Guardian was advised Release of Information must be obtained prior to any record release in order to collaborate their care with an outside provider. Patient/Guardian was advised if they have not already done so to contact the registration department to sign all necessary forms in order for [place] to release information regarding their care.   Consent: Patient/Guardian gives verbal consent for treatment and assignment of benefits for services provided during this visit. Patient/Guardian expressed understanding and agreed to proceed.   [place], LCSW 02/17/2023

## 2023-03-25 ENCOUNTER — Encounter: Payer: Self-pay | Admitting: Psychiatry

## 2023-03-25 ENCOUNTER — Telehealth (INDEPENDENT_AMBULATORY_CARE_PROVIDER_SITE_OTHER): Payer: Medicare HMO | Admitting: Psychiatry

## 2023-03-25 DIAGNOSIS — F3342 Major depressive disorder, recurrent, in full remission: Secondary | ICD-10-CM | POA: Diagnosis not present

## 2023-03-25 DIAGNOSIS — F17210 Nicotine dependence, cigarettes, uncomplicated: Secondary | ICD-10-CM

## 2023-03-25 DIAGNOSIS — F410 Panic disorder [episodic paroxysmal anxiety] without agoraphobia: Secondary | ICD-10-CM | POA: Diagnosis not present

## 2023-03-25 DIAGNOSIS — F1590 Other stimulant use, unspecified, uncomplicated: Secondary | ICD-10-CM | POA: Diagnosis not present

## 2023-03-25 DIAGNOSIS — F331 Major depressive disorder, recurrent, moderate: Secondary | ICD-10-CM | POA: Diagnosis not present

## 2023-03-25 DIAGNOSIS — F4000 Agoraphobia, unspecified: Secondary | ICD-10-CM | POA: Diagnosis not present

## 2023-03-25 DIAGNOSIS — G4701 Insomnia due to medical condition: Secondary | ICD-10-CM | POA: Diagnosis not present

## 2023-03-25 DIAGNOSIS — F17200 Nicotine dependence, unspecified, uncomplicated: Secondary | ICD-10-CM

## 2023-03-25 MED ORDER — BREXPIPRAZOLE 0.25 MG PO TABS: 0.2500 mg | ORAL_TABLET | ORAL | 1 refills | Status: DC

## 2023-03-25 NOTE — Progress Notes (Signed)
Virtual Visit via Telephone Note  I connected with Mary Koch on 03/25/23 at  8:30 AM EDT by telephone and verified that I am speaking with the correct person using two identifiers. Location Provider Location : Remote Office Patient Location : Home  Participants: Patient , Provider     I discussed the limitations, risks, security and privacy concerns of performing an evaluation and management service by telephone and the availability of in person appointments. I also discussed with the patient that there may be a patient responsible charge related to this service. The patient expressed understanding and agreed to proceed.   I discussed the assessment and treatment plan with the patient. The patient was provided an opportunity to ask questions and all were answered. The patient agreed with the plan and demonstrated an understanding of the instructions.   The patient was advised to call back or seek an in-person evaluation if the symptoms worsen or if the condition fails to improve as anticipated.    [facility] MD OP Progress Note  03/25/2023 8:54 AM [place]  MRN:  [id number]  Chief Complaint:  Chief Complaint  Patient presents with   Follow-up   Medication Refill   HPI: Mary Koch is a 52 year old Caucasian female, lives in [place], legally separated, has a history of MDD, panic disorder, agoraphobia, tobacco use disorder, caffeine use disorder was evaluated by telephone today.  Patient today reports she is currently doing fairly well.  She is currently sleeping better.    Patient reports overall mood symptoms as stable.  Denies any sadness, hopelessness, anhedonia.  Reports fatigue has improved since she is sleeping through the night.  Patient denies any significant anxiety symptoms.  Patient denies any suicidality, homicidality or perceptual disturbances.  Patient is currently compliant on medications.  Denies side effects.  Agreeable to tapering off of Rexulti now that  she is doing well.  Continues to follow-up with her therapist Ms. Christina Hussami.  I have reviewed notes per Ms. Christina Hussami dated 02/17/2023-currently working on self-care behaviors, positive social engagements, work/home/life balance and physical and emotional wellness.  Patient reports she is currently cutting back on smoking cigarettes.  She craves cigarettes as soon as she wakes up in the morning and hence smokes 1 to 2 cigarettes in the morning.  Otherwise she has not been smoking.  She is compliant on the Chantix.  Denies side effects.  Denies any other concerns today.  Visit Diagnosis:    ICD-10-CM   1. MDD (major depressive disorder), recurrent episode, moderate (HCC)  F33.1     2. Panic disorder  F41.0 brexpiprazole (REXULTI) 0.25 MG TABS tablet    3. Agoraphobia  F40.00     4. Insomnia due to medical condition  G47.01    lack of sleep hygiene, OSA    5. Tobacco use disorder  F17.200     6. 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 MDD (major depressive disorder), recurrent, in full remission ([place])  F33.42 brexpiprazole (REXULTI) 0.25 MG TABS tablet      Past Psychiatric History: I have reviewed past psychiatric history from progress note on 03/24/2018.  Past trials of Paxil, Luvox, Seroquel, Wellbutrin, Ambien, Klonopin   Past Medical History:  Past Medical History:  Diagnosis Date   Allergic rhinitis    Anemia    Anxiety    Anxiety and depression    Carpal tunnel syndrome    COVID-19 01/15/2020   Depression    Fibroids  GERD (gastroesophageal reflux disease)    Headache(784.0)    Headache(784.0)    Heavy periods    Hyperthyroidism    right portion of thyroid removed   Muscle spasm    Neurological disorder    evaluation for ms   Painful menstrual periods    Shortness of breath    when i smoke a lot    Past Surgical History:  Procedure Laterality Date   BREAST BIOPSY Left    neg   carpel tunnel Left 2017   CESAREAN SECTION     times 2    CHOLECYSTECTOMY     THYROID LOBECTOMY      Family Psychiatric History: I have reviewed family psychiatric history from progress note on 03/24/2018.  Family History:  Family History  Problem Relation Age of Onset   Osteoporosis Mother    Breast cancer Mother 62   Diabetes Father    Anxiety disorder Father    Depression Father    Post-traumatic stress disorder Father    Heart disease Father    Lung cancer Father    Anxiety disorder Sister    Alcohol abuse Brother    Colon cancer Neg Hx    Ovarian cancer Neg Hx     Social History: I have reviewed social history from progress note on 03/24/2018. Social History   Socioeconomic History   Marital status: Legally Separated    Spouse name: Not on file   Number of children: 2   Years of education: Not on file   Highest education level: High school graduate  Occupational History   Not on file  Tobacco Use   Smoking status: Every Day    Years: 25    Types: Cigarettes    Start date: 05/22/1986   Smokeless tobacco: Never   Tobacco comments:    Currently 1-2 cig per day - cut back- 03/25/23  Vaping Use   Vaping Use: Never used  Substance and Sexual Activity   Alcohol use: No    Alcohol/week: 0.0 standard drinks of alcohol   Drug use: No   Sexual activity: Yes    Partners: Male    Birth control/protection: Surgical, Injection  Other Topics Concern   Not on file  Social History Narrative   Not on file   Social Determinants of Health   Financial Resource Strain: Low Risk  (11/18/2022)   Overall Financial Resource Strain (CARDIA)    Difficulty of Paying Living Expenses: Not hard at all  Food Insecurity: No Food Insecurity (11/18/2022)   Hunger Vital Sign    Worried About Running Out of Food in the Last Year: Never true    [place] in the Last Year: Never true  Transportation Needs: No Transportation Needs (11/18/2022)   PRAPARE - Hydrologist (Medical): No    Lack of Transportation  (Non-Medical): No  Physical Activity: Insufficiently Active (11/18/2022)   Exercise Vital Sign    Days of Exercise per Week: 2 days    Minutes of Exercise per Session: 20 min  Stress: No Stress Concern Present (11/18/2022)   [place]    Feeling of Stress : Only a little  Social Connections: Socially Isolated (11/18/2022)   Social Connection and Isolation Panel [NHANES]    Frequency of Communication with Friends and Family: More than three times a week    Frequency of Social Gatherings with Friends and Family: Never    Attends Religious Services:  Never    Active Member of Clubs or Organizations: No    Attends Archivist Meetings: Never    Marital Status: Separated    Allergies:  Allergies  Allergen Reactions   Azithromycin Other (See Comments)    headache   Codeine Other (See Comments)    unknown   Penicillins Other (See Comments)    headaches    Metabolic Disorder Labs: Lab Results  Component Value Date   HGBA1C 5.0 08/02/2022   Lab Results  Component Value Date   PROLACTIN 4.3 03/29/2022   Lab Results  Component Value Date   CHOL 155 03/29/2022   TRIG 419 (H) 03/29/2022   HDL 26 (L) 03/29/2022   CHOLHDL 6.0 (H) 03/29/2022   LDLCALC  03/29/2022     Comment:     . LDL cholesterol not calculated. Triglyceride levels greater than 400 mg/dL invalidate calculated LDL results. . Reference range: <100 . Desirable range <100 mg/dL for primary prevention;   <70 mg/dL for patients with CHD or diabetic patients  with > or = 2 CHD risk factors. [place] Kitchen LDL-C is now calculated using the Martin-Hopkins  calculation, which is a validated novel method providing  better accuracy than the Friedewald equation in the  estimation of LDL-C.  Cresenciano Genre et al. Annamaria Helling. [id number]): 2061-2068  (http://education.[employer].com/faq/FAQ164)    Lab Results  Component Value Date   TSH 1.438 09/06/2021     Therapeutic Level Labs: No results found for: "LITHIUM" No results found for: "VALPROATE" No results found for: "CBMZ"  Current Medications: Current Outpatient Medications  Medication Sig Dispense Refill   albuterol (PROVENTIL) (2.5 MG/3ML) 0.083% nebulizer solution INHALE 3 ML BY NEBULIZATION EVERY 6 HOURS AS NEEDED FOR WHEEZING OR SHORTNESS OF BREATH 150 mL 1   albuterol (VENTOLIN HFA) 108 (90 Base) MCG/ACT inhaler INHALE 1 TO 2 PUFFS BY MOUTH DAILY 6.7 each 5   BREO ELLIPTA 100-25 MCG/ACT AEPB TAKE 1 PUFF BY MOUTH EVERY DAY 60 each 4   buPROPion ER (WELLBUTRIN SR) 100 MG 12 hr tablet Take 1 tablet (100 mg total) by mouth daily with breakfast. 90 tablet 1   cyclobenzaprine (FLEXERIL) 10 MG tablet Take 10 mg by mouth 3 (three) times daily as needed for muscle spasms.     DULoxetine (CYMBALTA) 20 MG capsule TAKE 1 CAPSULE (20 MG TOTAL) BY MOUTH DAILY. TAKE ALONG WITH 60 MG DAILY , TOTAL OF 80 MG 90 capsule 0   DULoxetine (CYMBALTA) 60 MG capsule TAKE 1 CAPSULE BY MOUTH EVERY DAY, TAKE ALONG WITH 20 MG DAILY 90 capsule 0   gabapentin (NEURONTIN) 300 MG capsule Take one pill (300mg ) every morning and 2 pills (600mg ) in the evening     meloxicam (MOBIC) 7.5 MG tablet TAKE 1 TABLET BY MOUTH EVERY DAY 30 tablet 3   omeprazole (PRILOSEC) 40 MG capsule Take 1 capsule (40 mg total) by mouth daily. 90 capsule 3   propranolol ER (INDERAL LA) 60 MG 24 hr capsule Take 60 mg by mouth daily.     rosuvastatin (CRESTOR) 20 MG tablet Take 1 tablet (20 mg total) by mouth daily. 90 tablet 3   Semaglutide, 2 MG/DOSE, (OZEMPIC, 2 MG/DOSE,) 8 MG/3ML SOPN Inject 2 mg into the skin once a week. 9 mL 3   topiramate (TOPAMAX) 100 MG tablet Take by mouth.     varenicline (CHANTIX) 0.5 MG tablet TAKE 1 TABLET BY MOUTH 2 TIMES DAILY. 180 tablet 1   brexpiprazole (REXULTI) 0.25 MG TABS tablet Take 1  tablet (0.25 mg total) by mouth as directed. Take every other day for 3-4 weeks and stop 90 tablet 1   Current  Facility-Administered Medications  Medication Dose Route Frequency Provider Last Rate Last Admin   medroxyPROGESTERone (DEPO-PROVERA) injection 150 mg  150 mg Intramuscular Q90 days Rubie Maid, MD   150 mg at 06/10/22 1558     Musculoskeletal: Strength & Muscle Tone:  [place]: UTA Patient leans: N/A  Psychiatric Specialty Exam: Review of Systems  Psychiatric/Behavioral: Negative.    All other systems reviewed and are negative.   There were no vitals taken for this visit.There is no height or weight on file to calculate BMI.  General Appearance: UTA  Eye Contact:   UTA  Speech:  Clear and Coherent  Volume:  Normal  Mood:  Euthymic  Affect:   UTA  Thought Process:  Goal Directed and Descriptions of Associations: Intact  Orientation:  Full (Time, Place, and Person)  Thought Content: Logical   Suicidal Thoughts:  No  Homicidal Thoughts:  No  Memory:  Immediate;   Fair Recent;   Fair Remote;   Fair  Judgement:  Fair  Insight:  Fair  Psychomotor Activity:   UTA  Concentration:  Concentration: Fair and Attention Span: Fair  Recall:  [employer] of Knowledge: Fair  Language: Fair  Akathisia:  No  Handed:  Right  AIMS (if indicated): not done  Assets:  Communication Skills Desire for Improvement Housing Social Support  ADL's:  Intact  Cognition: WNL  Sleep:   improving   Screenings: AIMS    Flowsheet Row Video Visit from 08/22/2022 in [facility] Video Visit from 05/22/2022 in [facility] Total Score 0 0      AUDIT    [place] Office Visit from 05/22/2016 in [facility]  Alcohol Use Disorder Identification Test Final Score (AUDIT) 0      GAD-7    Flowsheet Row Video Visit from 08/22/2022 in [facility] Counselor from 03/05/2022 in [place] [facility] Counselor from 10/06/2020 in [facility] Office Visit from 08/10/2019 in [facility]  Total GAD-7 Score 11 20 15 14       [place] from 09/27/2021 in [facility]  Total Score (max 30 points ) 29      PHQ2-9    Flowsheet Row Video Visit from 12/24/2022 in [place] from 11/18/2022 in [facility] Office Visit from 11/01/2022 in [facility] Video Visit from 10/17/2022 in [place] Video Visit from 08/22/2022 in [place]  PHQ-2 Total Score 2 3 0 0 1  PHQ-9 Total Score 9 3 6  -- --      Flowsheet Row Counselor from 02/17/2023 in [place] at [facility] Video Visit from 12/24/2022 in [place] Video Visit from 10/17/2022 in [place] CATEGORY Low Risk Low Risk Low Risk        Assessment and Plan: Mary Koch is a 52 year old Caucasian female who has a history of MDD, panic attacks, tobacco use disorder, caffeine use disorder, insomnia was evaluated by telemedicine today.  Patient today  reports she is currently improving, will benefit from the following plan.  Plan MDD in remission Cymbalta 80 mg p.o. daily Wellbutrin SR 100 mg p.o. daily in the morning Discussed tapering off Rexulti since patient is currently stable.  Patient advised to take the Rexulti every other day for the next 3 to 4 weeks.  Patient to monitor for worsening mood symptoms.  If she does have worsening depression symptoms she could go back on the Rexulti 0.25 mg daily. Continue CBT with Ms. Christina Hussami  Panic disorder-stable Cymbalta 80 mg p.o. daily Gabapentin as  prescribed per neurology Continue CBT  Insomnia-improving Patient was advised to work on sleep hygiene techniques.  Agoraphobia-stable Continue CBT  Tobacco use disorder-improving Chantix 0.5 mg p.o. twice daily Provided counseling for 1 minute.  Caffeine use disorder-unstable Provided counseling.  Follow-up in clinic in 2  months or sooner if needed.  Collaboration of Care: Collaboration of Care: Other review notes per therapist-Ms. Christina Hussami as noted above.  Patient/Guardian was advised Release of Information must be obtained prior to any record release in order to collaborate their care with an outside provider. Patient/Guardian was advised if they have not already done so to contact the registration department to sign all necessary forms in order for [place] to release information regarding their care.   Consent: Patient/Guardian gives verbal consent for treatment and assignment of benefits for services provided during this visit. Patient/Guardian expressed understanding and agreed to proceed.    I have spent at least 12 minutes non face to face with patient today.    This note was generated in part or whole with voice recognition software. Voice recognition is usually quite accurate but there are transcription errors that can and very often do occur. I apologize for any typographical errors that were not detected and corrected.      Ursula Alert, MD 03/25/2023, 8:54 AM

## 2023-03-27 ENCOUNTER — Other Ambulatory Visit: Payer: Self-pay | Admitting: Nurse Practitioner

## 2023-03-27 DIAGNOSIS — E785 Hyperlipidemia, unspecified: Secondary | ICD-10-CM

## 2023-04-08 DIAGNOSIS — E538 Deficiency of other specified B group vitamins: Secondary | ICD-10-CM | POA: Diagnosis not present

## 2023-04-08 DIAGNOSIS — G43119 Migraine with aura, intractable, without status migrainosus: Secondary | ICD-10-CM | POA: Diagnosis not present

## 2023-04-08 DIAGNOSIS — H8113 Benign paroxysmal vertigo, bilateral: Secondary | ICD-10-CM | POA: Diagnosis not present

## 2023-04-16 DIAGNOSIS — J309 Allergic rhinitis, unspecified: Secondary | ICD-10-CM | POA: Diagnosis not present

## 2023-04-16 DIAGNOSIS — K219 Gastro-esophageal reflux disease without esophagitis: Secondary | ICD-10-CM | POA: Diagnosis not present

## 2023-04-16 DIAGNOSIS — E785 Hyperlipidemia, unspecified: Secondary | ICD-10-CM | POA: Diagnosis not present

## 2023-04-16 DIAGNOSIS — N946 Dysmenorrhea, unspecified: Secondary | ICD-10-CM | POA: Diagnosis not present

## 2023-04-16 DIAGNOSIS — I10 Essential (primary) hypertension: Secondary | ICD-10-CM | POA: Diagnosis not present

## 2023-04-16 DIAGNOSIS — R7303 Prediabetes: Secondary | ICD-10-CM | POA: Diagnosis not present

## 2023-04-21 ENCOUNTER — Ambulatory Visit (INDEPENDENT_AMBULATORY_CARE_PROVIDER_SITE_OTHER): Payer: Medicare HMO | Admitting: Licensed Clinical Social Worker

## 2023-04-21 DIAGNOSIS — F331 Major depressive disorder, recurrent, moderate: Secondary | ICD-10-CM | POA: Diagnosis not present

## 2023-04-21 DIAGNOSIS — F4000 Agoraphobia, unspecified: Secondary | ICD-10-CM | POA: Diagnosis not present

## 2023-04-21 NOTE — Progress Notes (Signed)
Virtual Visit via Video Note  I connected with Mary Koch on 04/21/23 at  1:00 PM EDT by a video enabled telemedicine application and verified that I am speaking with the correct person using two identifiers.  Location: Patient: home Provider: remote office [place], [name])   I discussed the limitations of evaluation and management by telemedicine and the availability of in person appointments. The patient expressed understanding and agreed to proceed.   I discussed the assessment and treatment plan with the patient. The patient was provided an opportunity to ask questions and all were answered. The patient agreed with the plan and demonstrated an understanding of the instructions.   The patient was advised to call back or seek an in-person evaluation if the symptoms worsen or if the condition fails to improve as anticipated.  I provided 30 minutes of non-face-to-face time during this encounter.   Mary Koch R Mary Briski, LCSW   THERAPIST PROGRESS NOTE  Session Time: 1-130p  Participation Level: Active  Behavioral Response: Neat and Well GroomedAlertAnxious and Depressed  Type of Therapy: Individual Therapy  Treatment Goals addressed: Develop healthy thinking patterns and beliefs about self, others, and the world that lead to the alleviation and help prevent the relapse of depression per self report 3 out of 5 sessions documented   Learn and implement coping skills that result in a reduction of anxiety and worry, and improve daily functioning per pt report 3 out of 5 sessions documented   Mary Koch will demonstrate interest in social activities by initiating/joining social activity without prompts  ProgressTowards Goals: Progressing  Interventions: Motivational Interviewing and Supportive  Summary: Mary Koch is a 52 y.o. female who presents with iimproving symptoms related to depression and anxiety. Pt reports that she is compliant with her medication (cymbalta) and is  managing symptoms well.  Clinician assisted pt with identifying situations/scenarios/schemas triggering anxiety and/or depression symptoms. Allowed pt to explore and express thoughts and feelings and discussed current coping mechanisms. Reviewed changes/recommendations.  Patient reports that she continues feeling bad about mood management, anxiety/stress management, and about maintaining her current levels of progress.  Patient reports that she did have some stress recently when her dog bit a family member, but patient states there is no issues or concerns between herself and the family member, they just recognize that the dog is aggressive.  Continue to assist patient in attempting to identify pleasurable activities.  Discussed getting out of the house-patient states that she has a plan, and does leave her home to purchase things that she needs from the store, and at other times.  Patient states this is not something that she necessarily wants to change-this is the way she has been her whole life, and does not feel that it impacts her getting her needs met.  Continued recommendations are as follows: self care behaviors, positive social engagements, focusing on overall work/home/life balance, and focusing on positive physical and emotional wellness.   Suicidal/Homicidal: No  Therapist Response: Pt is continuing to apply interventions learned in session into daily life situations. Pt is currently on track to meet goals utilizing interventions mentioned above. Personal growth and progress noted. Treatment to continue as indicated.   Continued to encourage patient to reach out and go places beyond her comfort zone, and to manage any anxiety symptoms that are triggered from this.  Discussed the difference between dislike and phobia.  Patient is demonstrating ability to take steps to push herself outside of her comfort zone at times.  Plan: Return again  in 3 months  Diagnosis:  Encounter Diagnoses  Name  Primary?   MDD (major depressive disorder), recurrent episode, moderate Yes   Agoraphobia    Collaboration of Care: Other pt encouraged to continue care with psychiatrist of record, Dr. Jomarie Longs  Patient/Guardian was advised Release of Information must be obtained prior to any record release in order to collaborate their care with an outside provider. Patient/Guardian was advised if they have not already done so to contact the registration department to sign all necessary forms in order for Mary Koch to release information regarding their care.   Consent: Patient/Guardian gives verbal consent for treatment and assignment of benefits for services provided during this visit. Patient/Guardian expressed understanding and agreed to proceed.   Ernest Haber Keno Caraway, LCSW 04/21/2023

## 2023-05-05 ENCOUNTER — Ambulatory Visit (INDEPENDENT_AMBULATORY_CARE_PROVIDER_SITE_OTHER): Payer: Medicare HMO

## 2023-05-05 VITALS — BP 136/83 | HR 79 | Wt 226.9 lb

## 2023-05-05 DIAGNOSIS — Z3042 Encounter for surveillance of injectable contraceptive: Secondary | ICD-10-CM | POA: Diagnosis not present

## 2023-05-05 MED ORDER — MEDROXYPROGESTERONE ACETATE 150 MG/ML IM SUSY
150.0000 mg | PREFILLED_SYRINGE | Freq: Once | INTRAMUSCULAR | Status: AC
  Administered 2023-05-05: 150 mg via INTRAMUSCULAR

## 2023-05-05 NOTE — Progress Notes (Signed)
    NURSE VISIT NOTE  Subjective:    Patient ID: Mary Koch, female    DOB: 29-Apr-1971, 52 y.o.   MRN: [id number]  HPI  Patient is a 52 y.o. G24P2002 female who presents for depo provera injection.   Objective:    BP 136/83   Pulse 79   Wt 226 lb 14.4 oz (102.9 kg)   BMI 36.07 kg/m   Last Annual: 06/06/2022. Last pap: 06/06/2022. Last Depo-Provera: 02/10/2023 Side Effects if any: N/A Serum HCG indicated? N/A Depo-Provera 150 mg IM given by: Doristine Devoid, CMA. Site: Right Deltoid  Lab Review  @THIS  VISIT ONLY@  Assessment:   1. Encounter for surveillance of injectable contraceptive      Plan:   Next appointment due between July 22 thru August 5.    Burtis Junes, CMA

## 2023-05-07 ENCOUNTER — Other Ambulatory Visit: Payer: Self-pay | Admitting: Nurse Practitioner

## 2023-05-07 ENCOUNTER — Other Ambulatory Visit: Payer: Self-pay | Admitting: Psychiatry

## 2023-05-07 DIAGNOSIS — F17200 Nicotine dependence, unspecified, uncomplicated: Secondary | ICD-10-CM

## 2023-05-07 DIAGNOSIS — F3342 Major depressive disorder, recurrent, in full remission: Secondary | ICD-10-CM

## 2023-05-07 DIAGNOSIS — F410 Panic disorder [episodic paroxysmal anxiety] without agoraphobia: Secondary | ICD-10-CM

## 2023-05-21 ENCOUNTER — Telehealth (INDEPENDENT_AMBULATORY_CARE_PROVIDER_SITE_OTHER): Payer: Medicare HMO | Admitting: Psychiatry

## 2023-05-21 ENCOUNTER — Encounter: Payer: Self-pay | Admitting: Psychiatry

## 2023-05-21 DIAGNOSIS — F4000 Agoraphobia, unspecified: Secondary | ICD-10-CM | POA: Diagnosis not present

## 2023-05-21 DIAGNOSIS — F1590 Other stimulant use, unspecified, uncomplicated: Secondary | ICD-10-CM | POA: Diagnosis not present

## 2023-05-21 DIAGNOSIS — G4701 Insomnia due to medical condition: Secondary | ICD-10-CM

## 2023-05-21 DIAGNOSIS — F410 Panic disorder [episodic paroxysmal anxiety] without agoraphobia: Secondary | ICD-10-CM | POA: Diagnosis not present

## 2023-05-21 DIAGNOSIS — F17200 Nicotine dependence, unspecified, uncomplicated: Secondary | ICD-10-CM

## 2023-05-21 DIAGNOSIS — F3342 Major depressive disorder, recurrent, in full remission: Secondary | ICD-10-CM

## 2023-05-21 DIAGNOSIS — F17210 Nicotine dependence, cigarettes, uncomplicated: Secondary | ICD-10-CM

## 2023-05-21 MED ORDER — BUPROPION HCL ER (SR) 100 MG PO TB12: 100.0000 mg | ORAL_TABLET | Freq: Every day | ORAL | 1 refills | Status: DC

## 2023-05-21 NOTE — Progress Notes (Signed)
Virtual Visit via Video Note  I connected with Mary Koch on 05/21/23 at 11:30 AM EDT by a video enabled telemedicine application and verified that I am speaking with the correct person using two identifiers.  Location Provider Location : [facility] Patient Location : Home  Participants: Patient , Provider   I discussed the limitations of evaluation and management by telemedicine and the availability of in person appointments. The patient expressed understanding and agreed to proceed.   I discussed the assessment and treatment plan with the patient. The patient was provided an opportunity to ask questions and all were answered. The patient agreed with the plan and demonstrated an understanding of the instructions.   The patient was advised to call back or seek an in-person evaluation if the symptoms worsen or if the condition fails to improve as anticipated.   BH MD OP Progress Note  05/21/2023 12:50 PM Mary Koch  MRN:  [id number]  Chief Complaint:  Chief Complaint  Patient presents with   Follow-up   Anxiety   Depression   Medication Refill   HPI: Mary Koch is a 52 year old Caucasian female, lives in [place], legally separated, has a history of MDD, panic disorder, agoraphobia, tobacco use disorder, caffeine use disorder was evaluated by telemedicine today.  Patient today reports she is currently struggling with situational anxiety.  She reports her son got COVID-19 infection and is gradually recovering.  Her son was around her mother who has breast cancer as well as her brother who has lung cancer right around the time he tested positive for COVID-19.  Although her mother and brother are currently okay , she continues to be anxious about that.  Patient reports she is currently working on her social anxiety especially in group situations in public places.  She is currently working with her therapist.  Has upcoming appointment scheduled with Ms. Christina Hussami.  Patient  reports she continues to make sure she goes to public places like [employer] for shopping when there are not a lot of people around.  That does seem to help.  Patient currently compliant on her medications.  She however was able to taper off the Rexulti as recommended at last visit.  She tolerated the tapering process well.  Patient reports sleep is overall okay.  She had quit smoking however recently restarted again since she was anxious about her son.  Motivated to work on quitting again.  She does have Chantix available.  Receptive to counseling.  Patient denies any suicidality, homicidality or perceptual disturbances.  Patient denies any other concerns today.  Visit Diagnosis:    ICD-10-CM   1. MDD (major depressive disorder), recurrent, in full remission (HCC)  F33.42 buPROPion ER (WELLBUTRIN SR) 100 MG 12 hr tablet    2. Panic disorder  F41.0     3. Agoraphobia  F40.00     4. Insomnia due to medical condition  G47.01    Lack of sleep hygiene    5. Tobacco use disorder  F17.200     6. Caffeine use disorder  F15.90       Past Psychiatric History: I have reviewed past psychiatric history from progress note on 03/24/2018.  Past trials of Paxil, Luvox, Seroquel, Wellbutrin, Ambien, Klonopin.  Past Medical History:  Past Medical History:  Diagnosis Date   Allergic rhinitis    Anemia    Anxiety    Anxiety and depression    Carpal tunnel syndrome    COVID-19 01/15/2020   Depression  Fibroids    GERD (gastroesophageal reflux disease)    Headache(784.0)    Headache(784.0)    Heavy periods    Hyperthyroidism    right portion of thyroid removed   Muscle spasm    Neurological disorder    evaluation for ms   Painful menstrual periods    Shortness of breath    when i smoke a lot    Past Surgical History:  Procedure Laterality Date   BREAST BIOPSY Left    neg   carpel tunnel Left 2017   CESAREAN SECTION     times 2   CHOLECYSTECTOMY     THYROID LOBECTOMY       Family Psychiatric History: I have reviewed family psychiatric history from progress note on 03/24/2018.  Family History:  Family History  Problem Relation Age of Onset   Osteoporosis Mother    Breast cancer Mother 106   Diabetes Father    Anxiety disorder Father    Depression Father    Post-traumatic stress disorder Father    Heart disease Father    Lung cancer Father    Anxiety disorder Sister    Alcohol abuse Brother    Colon cancer Neg Hx    Ovarian cancer Neg Hx     Social History: I have reviewed social history from progress note on 03/24/2018. Social History   Socioeconomic History   Marital status: Legally Separated    Spouse name: Not on file   Number of children: 2   Years of education: Not on file   Highest education level: High school graduate  Occupational History   Not on file  Tobacco Use   Smoking status: Every Day    Years: 25    Types: Cigarettes    Start date: 05/22/1986   Smokeless tobacco: Never   Tobacco comments:    Currently 1-2 cig per day - cut back- 03/25/23  Vaping Use   Vaping Use: Never used  Substance and Sexual Activity   Alcohol use: No    Alcohol/week: 0.0 standard drinks of alcohol   Drug use: No   Sexual activity: Yes    Partners: Male    Birth control/protection: Surgical, Injection  Other Topics Concern   Not on file  Social History Narrative   Not on file   Social Determinants of Health   Financial Resource Strain: Low Risk  (11/18/2022)   Overall Financial Resource Strain (CARDIA)    Difficulty of Paying Living Expenses: Not hard at all  Food Insecurity: No Food Insecurity (11/18/2022)   Hunger Vital Sign    Worried About Running Out of Food in the Last Year: Never true    Ran Out of Food in the Last Year: Never true  Transportation Needs: No Transportation Needs (11/18/2022)   PRAPARE - Administrator, Civil Service (Medical): No    Lack of Transportation (Non-Medical): No  Physical Activity:  Insufficiently Active (11/18/2022)   Exercise Vital Sign    Days of Exercise per Week: 2 days    Minutes of Exercise per Session: 20 min  Stress: No Stress Concern Present (11/18/2022)   [employer] of Occupational Health - Occupational Stress Questionnaire    Feeling of Stress : Only a little  Social Connections: Socially Isolated (11/18/2022)   Social Connection and Isolation Panel [NHANES]    Frequency of Communication with Friends and Family: More than three times a week    Frequency of Social Gatherings with Friends and Family: Never  Attends Religious Services: Never    Active Member of Clubs or Organizations: No    Attends Banker Meetings: Never    Marital Status: Separated    Allergies:  Allergies  Allergen Reactions   Azithromycin Other (See Comments)    headache   Codeine Other (See Comments)    unknown   Penicillins Other (See Comments)    headaches    Metabolic Disorder Labs: Lab Results  Component Value Date   HGBA1C 5.0 08/02/2022   Lab Results  Component Value Date   PROLACTIN 4.3 03/29/2022   Lab Results  Component Value Date   CHOL 155 03/29/2022   TRIG 419 (H) 03/29/2022   HDL 26 (L) 03/29/2022   CHOLHDL 6.0 (H) 03/29/2022   LDLCALC  03/29/2022     Comment:     . LDL cholesterol not calculated. Triglyceride levels greater than 400 mg/dL invalidate calculated LDL results. . Reference range: <100 . Desirable range <100 mg/dL for primary prevention;   <70 mg/dL for patients with CHD or diabetic patients  with > or = 2 CHD risk factors. Marland Kitchen LDL-C is now calculated using the Martin-Hopkins  calculation, which is a validated novel method providing  better accuracy than the Friedewald equation in the  estimation of LDL-C.  Horald Pollen et al. Lenox Ahr. 1610;960(45): 2061-2068  (http://education.QuestDiagnostics.com/faq/FAQ164)    Lab Results  Component Value Date   TSH 1.438 09/06/2021    Therapeutic Level Labs: No results  found for: "LITHIUM" No results found for: "VALPROATE" No results found for: "CBMZ"  Current Medications: Current Outpatient Medications  Medication Sig Dispense Refill   albuterol (PROVENTIL) (2.5 MG/3ML) 0.083% nebulizer solution INHALE 3 ML BY NEBULIZATION EVERY 6 HOURS AS NEEDED FOR WHEEZING OR SHORTNESS OF BREATH 150 mL 1   albuterol (VENTOLIN HFA) 108 (90 Base) MCG/ACT inhaler INHALE 1 TO 2 PUFFS BY MOUTH DAILY 6.7 each 5   B-D 3CC LUER-LOK SYR 18GX1-1/2 18G X 1-1/2" 3 ML MISC USE AS DIRECTED TO DRAW UP B12     B-D DISP NEEDLE 25GX1" 25G X 1" MISC USE AS DIRECTED TO INJECT B12 INTO MUSCLE. USE FOR INJECT AFTER DRAW UP WITH 18 GAUGE NEEDLE.     BREO ELLIPTA 100-25 MCG/ACT AEPB TAKE 1 PUFF BY MOUTH EVERY DAY 60 each 4   cyanocobalamin (VITAMIN B12) 1000 MCG/ML injection Inject 1,000 mcg into the skin once a week.     cyclobenzaprine (FLEXERIL) 10 MG tablet Take 10 mg by mouth 3 (three) times daily as needed for muscle spasms.     DULoxetine (CYMBALTA) 20 MG capsule TAKE 1 CAPSULE (20 MG TOTAL) BY MOUTH DAILY. TAKE ALONG WITH 60 MG DAILY , TOTAL OF 80 MG 90 capsule 1   DULoxetine (CYMBALTA) 60 MG capsule TAKE 1 CAPSULE BY MOUTH EVERY DAY, TAKE ALONG WITH 20 MG DAILY 90 capsule 1   gabapentin (NEURONTIN) 300 MG capsule Take one pill (300mg ) every morning and 2 pills (600mg ) in the evening     meloxicam (MOBIC) 7.5 MG tablet TAKE 1 TABLET BY MOUTH EVERY DAY 30 tablet 3   omeprazole (PRILOSEC) 40 MG capsule Take 1 capsule (40 mg total) by mouth daily. 90 capsule 3   propranolol ER (INDERAL LA) 60 MG 24 hr capsule Take 60 mg by mouth daily.     rosuvastatin (CRESTOR) 20 MG tablet TAKE 1 TABLET BY MOUTH EVERY DAY 90 tablet 3   Semaglutide, 2 MG/DOSE, (OZEMPIC, 2 MG/DOSE,) 8 MG/3ML SOPN Inject 2 mg into the  skin once a week. 9 mL 3   topiramate (TOPAMAX) 100 MG tablet Take by mouth.     varenicline (CHANTIX) 0.5 MG tablet TAKE 1 TABLET BY MOUTH 2 TIMES DAILY. 180 tablet 1   buPROPion ER  (WELLBUTRIN SR) 100 MG 12 hr tablet Take 1 tablet (100 mg total) by mouth daily with breakfast. 90 tablet 1   Current Facility-Administered Medications  Medication Dose Route Frequency Provider Last Rate Last Admin   medroxyPROGESTERone (DEPO-PROVERA) injection 150 mg  150 mg Intramuscular Q90 days Hildred Laser, MD   150 mg at 06/10/22 1558     Musculoskeletal: Strength & Muscle Tone:  UTA Gait & Station: normal Patient leans: N/A  Psychiatric Specialty Exam: Review of Systems  Psychiatric/Behavioral:  The patient is nervous/anxious.   All other systems reviewed and are negative.   There were no vitals taken for this visit.There is no height or weight on file to calculate BMI.  General Appearance: Casual  Eye Contact:  Fair  Speech:  Normal Rate  Volume:  Normal  Mood:  Anxious situational  Affect:  Congruent  Thought Process:  Goal Directed and Descriptions of Associations: Intact  Orientation:  Full (Time, Place, and Person)  Thought Content: Logical   Suicidal Thoughts:  No  Homicidal Thoughts:  No  Memory:  Immediate;   Fair Recent;   Fair Remote;   Fair  Judgement:  Fair  Insight:  Fair  Psychomotor Activity:  Normal  Concentration:  Concentration: Fair and Attention Span: Fair  Recall:  [employer] of Knowledge: Fair  Language: Fair  Akathisia:  No  Handed:  Right  AIMS (if indicated): not done  Assets:  Communication Skills Desire for Improvement Housing Social Support  ADL's:  Intact  Cognition: WNL  Sleep:  Fair   Screenings: AIMS    Flowsheet Row Video Visit from 08/22/2022 in [facility] Video Visit from 05/22/2022 in [facility]  AIMS Total Score 0 0      AUDIT    Flowsheet Row Office Visit from 05/22/2016 in [facility]  Alcohol Use Disorder Identification Test Final Score (AUDIT) 0      GAD-7    Flowsheet Row Video  Visit from 08/22/2022 in [facility] Counselor from 03/05/2022 in [facility] Counselor from 10/06/2020 in [facility] Office Visit from 08/10/2019 in [facility]  Total GAD-7 Score 11 20 15 14       Mini-Mental    Flowsheet Row Clinical Support from 09/27/2021 in [facility]  Total Score (max 30 points ) 29      PHQ2-9    Flowsheet Row Video Visit from 12/24/2022 in [facility] Clinical Support from 11/18/2022 in [facility] Office Visit from 11/01/2022 in [facility] Video Visit from 10/17/2022 in [facility] Video Visit from 08/22/2022 in [facility]  PHQ-2 Total Score 2 3 0 0 1  PHQ-9 Total Score 9 3 6  -- --      Flowsheet Row Video Visit from 05/21/2023 in [facility] Counselor from 02/17/2023 in [facility] Video Visit from 12/24/2022 in [facility]  C-SSRS RISK CATEGORY No Risk Low Risk Low Risk  Assessment and Plan: Mary Koch is a 52 year old Caucasian female who has a history of MDD, panic attacks, tobacco use disorder, caffeine use disorder, insomnia was evaluated by telemedicine today.  Patient is currently stable.  Plan as noted below.  Plan MDD in remission Cymbalta 80 mg p.o. daily Wellbutrin SR 100 mg p.o. daily in the morning Discontinue Rexulti-tapered off.  Panic disorder/agoraphobia-stable Continue CBT with Ms. Christina Hussami Cymbalta 80 mg p.o. daily Gabapentin as prescribed by neurology  Insomnia-stable Continue sleep hygiene techniques  Tobacco use disorder-unstable Chantix 0.5 mg p.o. twice  daily Provided counseling for 1 minute.  Caffeine use disorder-improving Provided counseling  Follow-up in clinic in 4 to 5 months or sooner if needed.   Collaboration of Care: Collaboration of Care: Referral or follow-up with counselor/therapist AEB patient encouraged to continue CBT.  Patient/Guardian was advised Release of Information must be obtained prior to any record release in order to collaborate their care with an outside provider. Patient/Guardian was advised if they have not already done so to contact the registration department to sign all necessary forms in order for [place] to release information regarding their care.   Consent: Patient/Guardian gives verbal consent for treatment and assignment of benefits for services provided during this visit. Patient/Guardian expressed understanding and agreed to proceed.   This note was generated in part or whole with voice recognition software. Voice recognition is usually quite accurate but there are transcription errors that can and very often do occur. I apologize for any typographical errors that were not detected and corrected.    Jomarie Longs, MD 05/21/2023, 12:50 PM

## 2023-06-24 ENCOUNTER — Telehealth: Payer: Self-pay

## 2023-06-24 NOTE — Telephone Encounter (Signed)
Pt calling; she tried to schedule her mammogram at [facility]; needs to know what doctor to put it on?  [phone]

## 2023-06-24 NOTE — Telephone Encounter (Signed)
I contacted the patient via phone, she is scheduled for annual exam with MS on 7/15. The patient is aware to let [place] know she is see Guadlupe Spanish.

## 2023-07-04 ENCOUNTER — Other Ambulatory Visit: Payer: Self-pay | Admitting: Nurse Practitioner

## 2023-07-14 ENCOUNTER — Ambulatory Visit: Payer: Medicare HMO | Admitting: Obstetrics

## 2023-07-15 ENCOUNTER — Ambulatory Visit (HOSPITAL_COMMUNITY): Payer: Medicare HMO | Admitting: Licensed Clinical Social Worker

## 2023-07-15 DIAGNOSIS — F3342 Major depressive disorder, recurrent, in full remission: Secondary | ICD-10-CM

## 2023-07-15 DIAGNOSIS — F4000 Agoraphobia, unspecified: Secondary | ICD-10-CM

## 2023-07-15 DIAGNOSIS — F410 Panic disorder [episodic paroxysmal anxiety] without agoraphobia: Secondary | ICD-10-CM | POA: Diagnosis not present

## 2023-07-15 NOTE — Progress Notes (Signed)
Virtual Visit via Video Note  I connected with Lonia Mad on 07/15/23 at  1:00 PM EDT by a video enabled telemedicine application and verified that I am speaking with the correct person using two identifiers.  Location: Patient: home Provider: remote office [place], [name])   I discussed the limitations of evaluation and management by telemedicine and the availability of in person appointments. The patient expressed understanding and agreed to proceed.   I discussed the assessment and treatment plan with the patient. The patient was provided an opportunity to ask questions and all were answered. The patient agreed with the plan and demonstrated an understanding of the instructions.   The patient was advised to call back or seek an in-person evaluation if the symptoms worsen or if the condition fails to improve as anticipated.  I provided 16 minutes of non-face-to-face time during this encounter.   Gracielynn Birkel R Maggy Wyble, LCSW   THERAPIST PROGRESS NOTE  Session Time: 1-16p  Participation Level: Active  Behavioral Response: Neat and Well GroomedAlertAnxious and Depressed  Type of Therapy: Individual Therapy  Treatment Goals addressed: Develop healthy thinking patterns and beliefs about self, others, and the world that lead to the alleviation and help prevent the relapse of depression per self report 3 out of 5 sessions documented   Learn and implement coping skills that result in a reduction of anxiety and worry, and improve daily functioning per pt report 3 out of 5 sessions documented   [place] will demonstrate interest in social activities by initiating/joining social activity without prompts  ProgressTowards Goals: Progressing  Interventions: Motivational Interviewing and Supportive  Summary: Milcah Dulany is a 52 y.o. female who presents with improving symptoms related to depression and anxiety. Pt reports that she is compliant with her medication (cymbalta) and is managing  symptoms well. Pt reports that she feels continuing fatigue on a regular basis, regardless of quality/quantity of sleep.  Pt denies any significant episodes of depression. Reviewed depression management strategies including increasing stimulating activities, social engagement, and physical activity. Pt states that she doesn't really get out much and is fine with that.  Pt feels that they are managing situational stressors well. Pt reports that sleep quality and quantity is good and pt reporting normal appetite. Pt feels that they are doing a good job of using coping skills in the moment. Pt does not have any other questions or concerns to discuss at today's session.   Continued recommendations are as follows: self care behaviors, positive social engagements, focusing on overall work/home/life balance, and focusing on positive physical and emotional wellness.   Suicidal/Homicidal: No  Therapist Response: Pt is continuing to apply interventions learned in session into daily life situations. Pt is currently on track to meet goals utilizing interventions mentioned above. Personal growth and progress noted. Treatment to continue as indicated.   Continued to encourage patient to reach out and go places beyond her comfort zone, and to manage any anxiety symptoms that are triggered from this.  Discussed the difference between dislike and phobia.  Patient is demonstrating ability to take steps to push herself outside of her comfort zone at times.  Plan: Informed patient that clinician will be leaving outpatient department. Allowed pt to explore any questions or concerns and discussed future counseling options/resources. Provided pt with psychoeducational resources and list of OPT therapists. Encouraged pt to continue with psychiatric med management appointments, if applicable.    Diagnosis:  Encounter Diagnoses  Name Primary?   MDD (major depressive disorder), recurrent, in  full remission (HCC) Yes   Panic  disorder    Agoraphobia     Collaboration of Care: Other pt encouraged to continue care with psychiatrist of record, Dr. Jomarie Longs  Patient/Guardian was advised Release of Information must be obtained prior to any record release in order to collaborate their care with an outside provider. Patient/Guardian was advised if they have not already done so to contact the registration department to sign all necessary forms in order for [place] to release information regarding their care.   Consent: Patient/Guardian gives verbal consent for treatment and assignment of benefits for services provided during this visit. Patient/Guardian expressed understanding and agreed to proceed.   Ernest Haber Ketsia Linebaugh, LCSW 07/15/2023

## 2023-07-15 NOTE — Patient Instructions (Signed)
Outpatient Psychiatry and Counseling  FOR CRISIS:  call 911, Therapeutic Alternatives: Mobile Crisis Management 24 hours:  [phone], call [phone], [facility] ([facility]) [address] walk in, or go to your local EMERGENCY DEPARTMENT  [facility] [address] [place], [name] [address]  [phone]  [facility] [address] [place], [name] [address] [phone]  [facility] [address] [place],  [name]  [address] [phone]  [facility] Address: [address] Maurine Cane [place], [name] [address] Phone: [phone]  [facility] [place] [place] Locations) [email or web address]  [employer] [facility] [employer] fee and walk in schedule: M-F 8am-12pm/1pm-3pm [address]  [place], [name] [address] [phone]  [facility] [address] [place], [name] [address] [phone]  [facility] Outpatient Services/ Intensive Outpatient Therapy Program/CDIOP/PHP [address] [place], [name] [address] [phone]  [facility] Urgent [facility], Outpatient Therapy Services, [place] in [place]      [phone]     [address]    [place], [name] [address]                 [facility]   [facility] [phone]. [address] [place], [name] [address]  [facility]          [address] Bea [place]  [place], [name] [address]       [phone]  [facility] [address] [place], [name] [address] [phone]  [facility]    [address]    [place], [name] [address]     [phone]       [facility] [address] [name] [address]  [facility]     [address] [place], [name]      [phone]       [facility] Eulogio Ditch, MD [address] [place], [name] [address] [phone]  [facility]     [address]     [place], [name] [address]     [phone]       [facility] [address] [place], [name] [address] [phone] Resources for Temporary Residential Assistance/Crisis Centers

## 2023-07-21 ENCOUNTER — Ambulatory Visit (INDEPENDENT_AMBULATORY_CARE_PROVIDER_SITE_OTHER): Payer: Medicare HMO

## 2023-07-21 VITALS — BP 127/85 | HR 87 | Ht 66.5 in | Wt 230.0 lb

## 2023-07-21 DIAGNOSIS — Z3042 Encounter for surveillance of injectable contraceptive: Secondary | ICD-10-CM | POA: Diagnosis not present

## 2023-07-21 MED ORDER — MEDROXYPROGESTERONE ACETATE 150 MG/ML IM SUSP
150.0000 mg | Freq: Once | INTRAMUSCULAR | Status: AC
Start: 2023-07-21 — End: 2023-07-21
  Administered 2023-07-21: 150 mg via INTRAMUSCULAR

## 2023-07-21 NOTE — Progress Notes (Signed)
    NURSE VISIT NOTE  Subjective:    Patient ID: Bertine Schlottman, female    DOB: 1971-10-08, 52 y.o.   MRN: [id number]  HPI  Patient is a 52 y.o. G65P2002 female who presents for depo provera injection.   Objective:    BP 127/85   Pulse 87   Ht 5' 6.5" (1.689 m)   Wt 230 lb (104.3 kg)   BMI 36.57 kg/m   Last Annual: 06/06/22. Last pap: 06/06/22. Last Depo-Provera: 05/05/23. Side Effects if any: n/a. Serum HCG indicated? N/a. Depo-Provera 150 mg IM given by: Georgiana Shore, CMA. Site: Right [place]    Assessment:   1. Encounter for surveillance of injectable contraceptive      Plan:   Next appointment due between 10/06/23 and 10/20/23.    Loman Chroman, CMA

## 2023-07-23 NOTE — Telephone Encounter (Signed)
Pt is seeing Noelle Penner on 8/6 at 1:35.

## 2023-07-29 DIAGNOSIS — G43119 Migraine with aura, intractable, without status migrainosus: Secondary | ICD-10-CM | POA: Diagnosis not present

## 2023-07-29 DIAGNOSIS — G5603 Carpal tunnel syndrome, bilateral upper limbs: Secondary | ICD-10-CM | POA: Diagnosis not present

## 2023-07-29 DIAGNOSIS — Z6841 Body Mass Index (BMI) 40.0 and over, adult: Secondary | ICD-10-CM | POA: Diagnosis not present

## 2023-07-29 DIAGNOSIS — E538 Deficiency of other specified B group vitamins: Secondary | ICD-10-CM | POA: Diagnosis not present

## 2023-07-29 DIAGNOSIS — E1369 Other specified diabetes mellitus with other specified complication: Secondary | ICD-10-CM | POA: Diagnosis not present

## 2023-07-31 ENCOUNTER — Other Ambulatory Visit: Payer: Self-pay | Admitting: Student

## 2023-07-31 DIAGNOSIS — G43119 Migraine with aura, intractable, without status migrainosus: Secondary | ICD-10-CM

## 2023-08-04 ENCOUNTER — Encounter: Payer: Self-pay | Admitting: Student

## 2023-08-05 ENCOUNTER — Ambulatory Visit (INDEPENDENT_AMBULATORY_CARE_PROVIDER_SITE_OTHER): Payer: Medicare HMO

## 2023-08-05 VITALS — BP 136/84 | HR 86 | Wt 224.0 lb

## 2023-08-05 DIAGNOSIS — Z0000 Encounter for general adult medical examination without abnormal findings: Secondary | ICD-10-CM

## 2023-08-05 DIAGNOSIS — Z01419 Encounter for gynecological examination (general) (routine) without abnormal findings: Secondary | ICD-10-CM | POA: Diagnosis not present

## 2023-08-05 NOTE — Progress Notes (Signed)
Outpatient Gynecology Note: Annual Visit  Assessment/Plan:    Caylor Heckaman is a 52 y.o. female [phone] with normal well-woman gynecologic exam.   Well woman exam - Reviewed health maintenance topics as documented below.  - Pap smear NILM/HPV negative in 2023. Due for repeat screening in 2028. - Due for mammogram. Order placed. - Declines STI testing. - Follows with PCP for other screening and health maintenance.  - Desires to continue with Depo Provera injections. Reviewed theoretical risk for bone density loss with prolonged use. Labs drawn today for surveillance of hormones to determine menopausal state given she has not had regular periods while on Depo Provera.      Risk factors identified in Subjective to review: history of smoking Orders Placed This Encounter  Procedures   MM 3D SCREENING MAMMOGRAM BILATERAL BREAST    Standing Status:   Future    Standing Expiration Date:   08/04/2024    Order Specific Question:   Reason for Exam (SYMPTOM  OR DIAGNOSIS REQUIRED)    Answer:   screening    Order Specific Question:   Is the patient pregnant?    Answer:   No    Order Specific Question:   Preferred imaging location?    Answer:   External   [facility]   Current Outpatient Medications  Medication Instructions   albuterol (PROVENTIL) (2.5 MG/3ML) 0.083% nebulizer solution INHALE 3 ML BY NEBULIZATION EVERY 6 HOURS AS NEEDED FOR WHEEZING OR SHORTNESS OF BREATH   albuterol (VENTOLIN HFA) 108 (90 Base) MCG/ACT inhaler INHALE 1 TO 2 PUFFS BY MOUTH DAILY   B-D 3CC LUER-LOK SYR 18GX1-1/2 18G X 1-1/2" 3 ML MISC USE AS DIRECTED TO DRAW UP B12   B-D DISP NEEDLE 25GX1" 25G X 1" MISC USE AS DIRECTED TO INJECT B12 INTO MUSCLE. USE FOR INJECT AFTER DRAW UP WITH 18 GAUGE NEEDLE.   BREO ELLIPTA 100-25 MCG/ACT AEPB TAKE 1 PUFF BY MOUTH EVERY DAY   buPROPion ER (WELLBUTRIN SR) 100 mg, Oral, Daily with breakfast   cyanocobalamin (VITAMIN B12) 1,000 mcg, Subcutaneous, Weekly   cyclobenzaprine  (FLEXERIL) 10 mg, Oral, 3 times daily PRN   DULoxetine (CYMBALTA) 60 MG capsule TAKE 1 CAPSULE BY MOUTH EVERY DAY, TAKE ALONG WITH 20 MG DAILY   DULoxetine (CYMBALTA) 20 mg, Oral, Daily, Take along with 60 mg daily , total of 80 mg   gabapentin (NEURONTIN) 300 MG capsule Take one pill (300mg ) every morning and 2 pills (600mg ) in the evening   meloxicam (MOBIC) 7.5 mg, Oral, Daily   omeprazole (PRILOSEC) 40 mg, Oral, Daily   propranolol ER (INDERAL LA) 60 mg, Oral, Daily   rosuvastatin (CRESTOR) 20 mg, Oral, Daily   Semaglutide, 2 MG/DOSE, (OZEMPIC, 2 MG/DOSE,) 8 MG/3ML SOPN INJECT 2MG  UNDER THE SKIN ONCE WEEKLY   topiramate (TOPAMAX) 100 MG tablet Oral   varenicline (CHANTIX) 0.5 mg, Oral, 2 times daily    No follow-ups on file.    Subjective:    Myrta Madeeha Virrueta is a 52 y.o. female [phone] who presents for annual wellness visit.   Occupation on disability    Lives with two sons.    CONCERNS? Has been on Depo for 14 years.   Well Woman Visit:  GYN HISTORY:  No LMP recorded. Patient has had an injection.     Menstrual History: OB History     Gravida  2   Para  2   Term  2   Preterm  AB      Living  2      SAB      IAB      Ectopic      Multiple      Live Births  2           Menarche age: 68 No LMP recorded. Patient has had an injection. Period Pattern: (!) Irregular    Intermenstrual bleeding, spotting, or discharge? Occasional spotting with Depo Urinary incontinence? no  Sexually active: yes Number of sexual partners: one Gender of sexual Partners: male Dyspareunia? no Last pap: 2023, NILM History of abnormal Pap: no Gardasil series:  no STI history: no Contraceptive methods: Depo Provera.  Health Maintenance > Reviewed breast self-awareness > History of abnormal mammogram: history of biopsy, normal biopsy > Exercise: walking, moderately active > Dietary Supplements: Vit B12 > Body mass index is 35.61 kg/m. > Recent dental visit  No. > Seat Belt Use: Yes.   > Texting and driving? No. > Guns in the house Yes.  , locked > STI/HIV testing or immunizations needed? No. > Concern for alcohol abuse? none   Tobacco or other drug use: Cigarettes, about half a pack a day Tobacco Use: High Risk (08/05/2023)   Patient History    Smoking Tobacco Use: Every Day    Smokeless Tobacco Use: Never    Passive Exposure: Not on file     PHQ-2 Score: In last two weeks, how often have you felt: Little interest or pleasure in doing things: Several days (+1) Feeling down, depressed or hopeless: Several days (+1) Score: 2 , sees therapist regulary  GAD-2 Over the last 2 weeks, how often have you been bothered by the following problems? Feeling nervous, anxious or on edge: Nearly every day (+3) Not being able to stop or control worrying: Nearly every day (+3)} Score: 6, sees therapist monthly.    If >50:  Last mammogram:  unsure, but at least 2-3 years Age at menopause: n/a Last colonoscopy: follows with PCP Last lipid screening: 2023   _________________________________________________________  Current Outpatient Medications  Medication Sig Dispense Refill   albuterol (PROVENTIL) (2.5 MG/3ML) 0.083% nebulizer solution INHALE 3 ML BY NEBULIZATION EVERY 6 HOURS AS NEEDED FOR WHEEZING OR SHORTNESS OF BREATH 150 mL 1   albuterol (VENTOLIN HFA) 108 (90 Base) MCG/ACT inhaler INHALE 1 TO 2 PUFFS BY MOUTH DAILY 6.7 each 5   B-D 3CC LUER-LOK SYR 18GX1-1/2 18G X 1-1/2" 3 ML MISC USE AS DIRECTED TO DRAW UP B12     B-D DISP NEEDLE 25GX1" 25G X 1" MISC USE AS DIRECTED TO INJECT B12 INTO MUSCLE. USE FOR INJECT AFTER DRAW UP WITH 18 GAUGE NEEDLE.     BREO ELLIPTA 100-25 MCG/ACT AEPB TAKE 1 PUFF BY MOUTH EVERY DAY 60 each 4   buPROPion ER (WELLBUTRIN SR) 100 MG 12 hr tablet Take 1 tablet (100 mg total) by mouth daily with breakfast. 90 tablet 1   cyanocobalamin (VITAMIN B12) 1000 MCG/ML injection Inject 1,000 mcg into the skin once a week.      cyclobenzaprine (FLEXERIL) 10 MG tablet Take 10 mg by mouth 3 (three) times daily as needed for muscle spasms.     DULoxetine (CYMBALTA) 20 MG capsule TAKE 1 CAPSULE (20 MG TOTAL) BY MOUTH DAILY. TAKE ALONG WITH 60 MG DAILY , TOTAL OF 80 MG 90 capsule 1   DULoxetine (CYMBALTA) 60 MG capsule TAKE 1 CAPSULE BY MOUTH EVERY DAY, TAKE ALONG WITH 20 MG DAILY 90 capsule 1  gabapentin (NEURONTIN) 300 MG capsule Take one pill (300mg ) every morning and 2 pills (600mg ) in the evening     meloxicam (MOBIC) 7.5 MG tablet TAKE 1 TABLET BY MOUTH EVERY DAY 30 tablet 3   omeprazole (PRILOSEC) 40 MG capsule Take 1 capsule (40 mg total) by mouth daily. 90 capsule 3   propranolol ER (INDERAL LA) 60 MG 24 hr capsule Take 60 mg by mouth daily.     rosuvastatin (CRESTOR) 20 MG tablet TAKE 1 TABLET BY MOUTH EVERY DAY 90 tablet 3   Semaglutide, 2 MG/DOSE, (OZEMPIC, 2 MG/DOSE,) 8 MG/3ML SOPN INJECT 2MG  UNDER THE SKIN ONCE WEEKLY 1 mL 2   topiramate (TOPAMAX) 100 MG tablet Take by mouth.     varenicline (CHANTIX) 0.5 MG tablet TAKE 1 TABLET BY MOUTH 2 TIMES DAILY. 180 tablet 1   No current facility-administered medications for this visit.   Allergies  Allergen Reactions   Azithromycin Other (See Comments)    headache   Codeine Other (See Comments)    unknown   Penicillins Other (See Comments)    headaches    Past Medical History:  Diagnosis Date   Allergic rhinitis    Anemia    Anxiety    Anxiety and depression    Carpal tunnel syndrome    COVID-19 01/15/2020   Depression    Fibroids    GERD (gastroesophageal reflux disease)    Headache(784.0)    Headache(784.0)    Heavy periods    Hyperthyroidism    right portion of thyroid removed   Muscle spasm    Neurological disorder    evaluation for ms   Painful menstrual periods    Shortness of breath    when i smoke a lot   Past Surgical History:  Procedure Laterality Date   BREAST BIOPSY Left    neg   carpel tunnel Left 2017   CESAREAN SECTION      times 2   CHOLECYSTECTOMY     THYROID LOBECTOMY     OB History     Gravida  2   Para  2   Term  2   Preterm      AB      Living  2      SAB      IAB      Ectopic      Multiple      Live Births  2          Social History   Tobacco Use   Smoking status: Every Day    Types: Cigarettes    Start date: 05/22/1986   Smokeless tobacco: Never   Tobacco comments:    Currently 1-2 cig per day - cut back- 03/25/23  Substance Use Topics   Alcohol use: No    Alcohol/week: 0.0 standard drinks of alcohol   Social History   Substance and Sexual Activity  Sexual Activity Yes   Partners: Male   Birth control/protection: Surgical, Injection    Immunization History  Administered Date(s) Administered   Influenza, Quadrivalent, Recombinant, Inj, Pf 11/01/2019   Influenza,inj,Quad PF,6+ Mos 10/08/2017, 09/10/2019   Tdap 08/08/2019     Review Of Systems  Constitutional: Denied constitutional symptoms, night sweats, recent illness, fatigue, fever, insomnia and weight loss.  Eyes: Denied eye symptoms, eye pain, photophobia, vision change and visual disturbance.  Ears/Nose/Throat/Neck: Denied ear, nose, throat or neck symptoms, hearing loss, nasal discharge, sinus congestion and sore throat.  Cardiovascular: Denied cardiovascular symptoms, arrhythmia, chest pain/pressure,  edema, exercise intolerance, orthopnea and palpitations.  Respiratory: Denied pulmonary symptoms, asthma, pleuritic pain, productive sputum, cough, dyspnea and wheezing.  Gastrointestinal: Denied, gastro-esophageal reflux, melena, nausea and vomiting.  Genitourinary: Denied genitourinary symptoms including symptomatic vaginal discharge, pelvic relaxation issues, and urinary complaints.  Musculoskeletal: Denied musculoskeletal symptoms, stiffness, swelling, muscle weakness and myalgia.  Dermatologic: Denied dermatology symptoms, rash and scar.  Neurologic: Denied neurology symptoms, dizziness, headache,  neck pain and syncope.  Psychiatric: Denied psychiatric symptoms, anxiety and depression.  Endocrine: Denied endocrine symptoms including hot flashes and night sweats.      Objective:    BP 136/84   Pulse 86   Wt 224 lb (101.6 kg)   BMI 35.61 kg/m   Constitutional: Well-developed, well-nourished female in no acute distress Neurological: Alert and oriented to person, place, and time Psychiatric: Mood and affect appropriate Skin: No rashes or lesions Neck: Supple without masses. Trachea is midline.Thyroid is normal size without masses Lymphatics: No cervical, axillary, supraclavicular, or inguinal adenopathy noted Respiratory: Clear to auscultation bilaterally. Good air movement with normal work of breathing. Cardiovascular: Regular rate and rhythm. Extremities grossly normal, nontender with no edema; pulses regular Gastrointestinal: Soft, nontender, nondistended. No masses or hernias appreciated. No hepatosplenomegaly. No fluid wave. No rebound or guarding. Breast Exam: normal appearance, no masses or tenderness Genitourinary:  deferred    Rectal: deferred    Autumn Messing, CNM  08/05/23 3:43 PM

## 2023-08-05 NOTE — Assessment & Plan Note (Addendum)
-   Reviewed health maintenance topics as documented below.  - Pap smear NILM/HPV negative in 2023. Due for repeat screening in 2028. - Due for mammogram. Order placed. - Declines STI testing. - Follows with PCP for other screening and health maintenance.  - Desires to continue with Depo Provera injections. Reviewed theoretical risk for bone density loss with prolonged use. Labs drawn today for surveillance of hormones to determine menopausal state given she has not had regular periods while on Depo Provera.

## 2023-08-07 ENCOUNTER — Inpatient Hospital Stay: Admission: RE | Admit: 2023-08-07 | Payer: Medicare HMO | Source: Ambulatory Visit

## 2023-08-11 ENCOUNTER — Ambulatory Visit
Admission: RE | Admit: 2023-08-11 | Discharge: 2023-08-11 | Disposition: A | Discharge status: Home / Self Care | Payer: Medicare HMO | Source: Ambulatory Visit | Attending: Student | Admitting: Student

## 2023-08-11 DIAGNOSIS — G43919 Migraine, unspecified, intractable, without status migrainosus: Secondary | ICD-10-CM | POA: Diagnosis not present

## 2023-08-11 DIAGNOSIS — G43119 Migraine with aura, intractable, without status migrainosus: Secondary | ICD-10-CM

## 2023-08-14 ENCOUNTER — Ambulatory Visit
Admission: RE | Admit: 2023-08-14 | Discharge: 2023-08-14 | Disposition: A | Discharge status: Home / Self Care | Payer: Medicare HMO | Source: Ambulatory Visit

## 2023-08-14 DIAGNOSIS — Z1231 Encounter for screening mammogram for malignant neoplasm of breast: Secondary | ICD-10-CM | POA: Insufficient documentation

## 2023-08-14 DIAGNOSIS — Z01419 Encounter for gynecological examination (general) (routine) without abnormal findings: Secondary | ICD-10-CM

## 2023-08-28 ENCOUNTER — Ambulatory Visit: Payer: Medicare HMO | Attending: Neurology

## 2023-08-28 DIAGNOSIS — R2681 Unsteadiness on feet: Secondary | ICD-10-CM | POA: Insufficient documentation

## 2023-08-28 DIAGNOSIS — R42 Dizziness and giddiness: Secondary | ICD-10-CM | POA: Diagnosis not present

## 2023-08-28 NOTE — Therapy (Signed)
OUTPATIENT PHYSICAL THERAPY VESTIBULAR EVALUATION     Patient Name: Mary Koch MRN: [id number] DOB:05-31-1971, 52 y.o., female Today's Date: 08/28/2023  END OF SESSION:  PT End of Session - 08/28/23 1102     Visit Number 1    Number of Visits 9    Date for PT Re-Evaluation 10/24/23    Authorization Type [employer]    PT Start Time 1102    PT Stop Time 1146    PT Time Calculation (min) 44 min    Equipment Utilized During Treatment Gait belt    Activity Tolerance Patient tolerated treatment well    Behavior During Therapy WFL for tasks assessed/performed             Past Medical History:  Diagnosis Date   Allergic rhinitis    Anemia    Anxiety    Anxiety and depression    Carpal tunnel syndrome    COVID-19 01/15/2020   Depression    Fibroids    GERD (gastroesophageal reflux disease)    Headache(784.0)    Headache(784.0)    Heavy periods    Hyperthyroidism    right portion of thyroid removed   Muscle spasm    Neurological disorder    evaluation for ms   Painful menstrual periods    Shortness of breath    when i smoke a lot   Past Surgical History:  Procedure Laterality Date   BREAST BIOPSY Left    neg   carpel tunnel Left 2017   CESAREAN SECTION     times 2   CHOLECYSTECTOMY     THYROID LOBECTOMY     Patient Active Problem List   Diagnosis Date Noted   Well woman exam 08/05/2023   MDD (major depressive disorder), recurrent, in full remission (HCC) 08/14/2022   Hot flashes 03/29/2022   Shortness of breath 09/25/2021   Severe episode of recurrent major depressive disorder, without psychotic features (HCC) 09/13/2021   EKG abnormalities 09/10/2021   Insomnia due to medical condition 09/05/2021   At risk for prolonged QT interval syndrome 09/05/2021   Prediabetes 03/30/2021   Chronic pain of left knee 03/30/2021   MDD (major depressive disorder), recurrent episode, moderate (HCC) 11/13/2020   Essential hypertension 10/23/2020   Other specified  diabetes mellitus with other specified complication (HCC) 10/23/2020   Recurrent major depressive disorder, in full remission (HCC) 07/25/2020   Tick bite 07/10/2020   Blurred vision 07/10/2020   Lethargy 07/10/2020   High risk medication use 04/06/2020   Panic disorder 07/28/2019   Bereavement 07/28/2019   MDD (major depressive disorder), recurrent episode, with atypical features (HCC) 07/28/2019   Agoraphobia 07/28/2019   Insomnia due to other mental disorder 07/28/2019   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   Tobacco use disorder 12/16/2017   Anxiety 06/25/2015   Carpal tunnel syndrome 06/25/2015   Family planning 06/25/2015   Bloodgood disease 06/25/2015   Fibroid 06/25/2015   Acid reflux 06/25/2015   Excess, menstruation 06/25/2015   Obesity (BMI 30-39.9) 06/25/2015   Class 3 severe obesity due to excess calories with serious comorbidity and body mass index (BMI) of 40.0 to 44.9 in adult [facility]) 06/25/2015   Dysmenorrhea 06/25/2015   Disease of thyroid gland 06/25/2015   Compulsive tobacco user syndrome 06/25/2015   Episodic tension type headache 08/23/2014   Allergic rhinitis 07/23/2013   Wheezing 07/23/2013   Lower urinary tract infectious disease 03/11/2013   Hematuria 03/11/2013   D (diarrhea) 01/13/2013   Vomiting 01/13/2013   Amenorrhea  12/22/2012   HLD (hyperlipidemia) 08/31/2011    PCP: Corky Downs, MD REFERRING PROVIDER: Lonell Face, MD  REFERRING DIAG: H81.10 (ICD-10-CM) - Benign paroxysmal positional vertigo  THERAPY DIAG:  Dizziness and giddiness  Unsteadiness on feet  ONSET DATE: 07/29/23  Rationale for Evaluation and Treatment: Rehabilitation  SUBJECTIVE:   SUBJECTIVE STATEMENT: Patient reports she was diagnosed with Vertigo. Reports started approximately one year. Reports when she sits up she feels very lightheadedness and has a spinning sensation. Reports it lasts approx 4-5 minutes. Denies nausea/vomiting. Reports her last episode 3 days ago.  History of migraines, has approx 3-4/month, that can last days as a time. Has phonophobia and photophobia, as well as visual auras.  Pt accompanied by: self  PERTINENT HISTORY: Anxiety, Migraines, Carpal Tunnel, GERD, Depression, Hyperthyroidism  PAIN:  Are you having pain? No  PRECAUTIONS: None  RED FLAGS: None   WEIGHT BEARING RESTRICTIONS: No  FALLS: Has patient fallen in last 6 months? No  LIVING ENVIRONMENT: Lives with: lives alone Lives in: House/apartment  PLOF: Independent  PATIENT GOALS: resolve the dizziness  OBJECTIVE:   DIAGNOSTIC FINDINGS: CT HEAD August 2024: No evidence of acute intracranial abnormality.  COGNITION: Overall cognitive status: Within functional limits for tasks assessed   STRENGTH: WFL  BED MOBILITY:  Sit to supine Complete Independence Supine to sit Complete Independence  TRANSFERS: Assistive device utilized: None  Sit to stand: Complete Independence Stand to sit: Complete Independence   GAIT: Gait pattern: WFL Distance walked: into/out of clinic Assistive device utilized: None Level of assistance: Complete Independence Comments: no instability noted with ambulation into/out of therapy session  PATIENT SURVEYS:  FOTO TBA at 2nd visit  VESTIBULAR ASSESSMENT:   SYMPTOM BEHAVIOR:  Subjective history: One year history of intermittent vertigo, reports short duration spending with quick movements, position changes. But can sometimes occur suddenly. Patient reports tinnitus and hearing changes in L ear. Patient also has history of migraines, approx 3-4/month. Phonophobia and photophobia.   Non-Vestibular symptoms: changes in hearing, headaches, tinnitus, nausea/vomiting, and migraine symptoms  Type of dizziness: Blurred Vision, Imbalance (Disequilibrium), Spinning/Vertigo, Unsteady with head/body turns, and Lightheadedness/Faint  Frequency: few times/week  Duration: minutes to hours  Aggravating factors: Induced by position change:  supine to sit and sit to stand and Induced by motion: occur when walking, looking up at the ceiling, turning body quickly, and turning head quickly  Relieving factors: medication and rest  Progression of symptoms: unchanged  OCULOMOTOR EXAM:  Ocular Alignment: normal  Ocular ROM: No Limitations  Spontaneous Nystagmus: absent  Gaze-Induced Nystagmus: absent  Smooth Pursuits: intact; moderate dizziness  Saccades: hypometric/undershoots; more symptomatic to L    VESTIBULAR - OCULAR REFLEX:   Slow VOR: Normal  VOR Cancellation: Comment: able to maintain gaze; moderate dizziness.   Head-Impulse Test: HIT Right: negative HIT Left: positive  Dynamic Visual Acuity:  TBA at next session   POSITIONAL TESTING: Right Dix-Hallpike: no nystagmus Left Dix-Hallpike: no nystagmus Right Roll Test: no nystagmus Left Roll Test: no nystagmus Patient reports mild spinning sensation with all positional testing; no nystagmus noted.  MOTION SENSITIVITY:  Motion Sensitivity Quotient Intensity: 0 = none, 1 = Lightheaded, 2 = Mild, 3 = Moderate, 4 = Severe, 5 = Vomiting  Intensity  1. Sitting to supine 2  2. Supine to L side 1  3. Supine to R side 1  4. Supine to sitting 2  5. L Hallpike-Dix 3  6. Up from L  3  7. R Hallpike-Dix 3  8.  Up from R  3  9. Sitting, head tipped to L knee 3  10. Head up from L knee 4  11. Sitting, head tipped to R knee 3  12. Head up from R knee 4  13. Sitting head turns x5 4  14.Sitting head nods x5 4  15. In stance, 180 turn to L  3  16. In stance, 180 turn to R 3    OTHOSTATICS: not done  PATIENT EDUCATION: Education details: Educated on Differential Diagnosis; including Vestibular Migraines; No nystagmus indicated BPPV at this time. Eval Findings and POC Person educated: Patient Education method: Explanation Education comprehension: verbalized understanding  HOME EXERCISE PROGRAM:  GOALS: Goals reviewed with patient? Yes  SHORT TERM GOALS: Target date:  09/26/2023  Pt will be independent with initial HEP for improved vestibular input and balance  Baseline: no HEP established Goal status: INITIAL  2.  FGA TBA and LTG to be set as applicable Baseline: TBA Goal status: INITIAL  3.  Pt will report </= 3/5 for all movements on MSQ to indicate improvement in motion sensitivity and improved activity tolerance.  Baseline: 3-4/5 Goal status: INITIAL   LONG TERM GOALS: Target date: 10/24/2023  Pt will be independent with final HEP for improved vestibular input and balance  Baseline: no HEP established Goal status: INITIAL  2.  Pt will improve DVA to </= 2 line difference to indicate improved VOR Baseline: TBA Goal status: INITIAL  3.  Pt will report </= 2/5 for all movements on MSQ to indicate improvement in motion sensitivity and improved activity tolerance.  Baseline: 3-4/5 Goal status: INITIAL  4.  Pt will improve FGA by 4 points from baseline to demonstrate improved balance and reduced fall risk Baseline: TBA Goal status: INITIAL  ASSESSMENT:  CLINICAL IMPRESSION: Patient is a 52 y.o. female who was seen today for physical therapy evaluation for Vertigo. Patient's PMH significant for the following: Anxiety, Migraines, Carpal Tunnel, GERD, Depression, Hyperthyroidism. Patient will benefit from skilled PT services to address the following impairments noted on evaluation: dizziness, migraines, impaired oculomotor exam, positive HIT on L, increased motion sensitivity with functional movements and reduced activity tolerance. Will benefit from further balance assessment to determine impact on balance and potential increased risk for falls. Patient also presenting with decreased hearing recently in L ear, with PT educating on potential option for ENT assessment. PT will inform MD of findings. Patient will benefit from skilled PT services to address impairments, improve symptoms and promote improved quality of life.    OBJECTIVE IMPAIRMENTS:  decreased activity tolerance, decreased balance, decreased knowledge of condition, dizziness, and pain.   ACTIVITY LIMITATIONS: bending, standing, squatting, bed mobility, reach over head, and locomotion level  PARTICIPATION LIMITATIONS: cleaning, driving, community activity, and yard work  PERSONAL FACTORS: Time since onset of injury/illness/exacerbation and 3+ comorbidities: Anxiety, Migraines, Carpal Tunnel, GERD, Depression, Hyperthyroidism  are also affecting patient's functional outcome.   REHAB POTENTIAL: Good  CLINICAL DECISION MAKING: Evolving/moderate complexity  EVALUATION COMPLEXITY: Moderate   PLAN:  PT FREQUENCY: 1x/week  PT DURATION: 8 weeks  PLANNED INTERVENTIONS: Therapeutic exercises, Therapeutic activity, Neuromuscular re-education, Balance training, Gait training, Patient/Family education, Self Care, Joint mobilization, Stair training, Vestibular training, Canalith repositioning, and Manual therapy  PLAN FOR NEXT SESSION: Assess DVA, FGA, FOTO (if have time). Initiate HEP focused on saccades, gaze stabilization.    Creed Copper Fairly, PT, DPT 08/28/23 11:59 AM

## 2023-09-02 ENCOUNTER — Ambulatory Visit: Payer: Medicare HMO

## 2023-09-04 ENCOUNTER — Ambulatory Visit: Payer: Medicare HMO | Attending: Neurology

## 2023-09-04 DIAGNOSIS — R2681 Unsteadiness on feet: Secondary | ICD-10-CM | POA: Insufficient documentation

## 2023-09-04 DIAGNOSIS — R42 Dizziness and giddiness: Secondary | ICD-10-CM | POA: Insufficient documentation

## 2023-09-04 NOTE — Therapy (Signed)
OUTPATIENT PHYSICAL THERAPY VESTIBULAR TREATMENT NOTE   Patient Name: Mary Koch MRN: [id number] DOB:07/26/1971, 52 y.o., female Today's Date: 09/04/2023  PCP: Corky Downs, MD  REFERRING PROVIDER: Lonell Face, MD   END OF SESSION:  PT End of Session - 09/04/23 1059     Visit Number 2    Number of Visits 9    Date for PT Re-Evaluation 10/24/23    Authorization Type [employer]    PT Start Time 1100    PT Stop Time 1144    PT Time Calculation (min) 44 min    Equipment Utilized During Treatment Gait belt    Activity Tolerance Patient tolerated treatment well    Behavior During Therapy WFL for tasks assessed/performed             Past Medical History:  Diagnosis Date   Allergic rhinitis    Anemia    Anxiety    Anxiety and depression    Carpal tunnel syndrome    COVID-19 01/15/2020   Depression    Fibroids    GERD (gastroesophageal reflux disease)    Headache(784.0)    Headache(784.0)    Heavy periods    Hyperthyroidism    right portion of thyroid removed   Muscle spasm    Neurological disorder    evaluation for ms   Painful menstrual periods    Shortness of breath    when i smoke a lot   Past Surgical History:  Procedure Laterality Date   BREAST BIOPSY Left    neg   carpel tunnel Left 2017   CESAREAN SECTION     times 2   CHOLECYSTECTOMY     THYROID LOBECTOMY     Patient Active Problem List   Diagnosis Date Noted   Well woman exam 08/05/2023   MDD (major depressive disorder), recurrent, in full remission (HCC) 08/14/2022   Hot flashes 03/29/2022   Shortness of breath 09/25/2021   Severe episode of recurrent major depressive disorder, without psychotic features (HCC) 09/13/2021   EKG abnormalities 09/10/2021   Insomnia due to medical condition 09/05/2021   At risk for prolonged QT interval syndrome 09/05/2021   Prediabetes 03/30/2021   Chronic pain of left knee 03/30/2021   MDD (major depressive disorder), recurrent episode, moderate (HCC)  11/13/2020   Essential hypertension 10/23/2020   Other specified diabetes mellitus with other specified complication (HCC) 10/23/2020   Recurrent major depressive disorder, in full remission (HCC) 07/25/2020   Tick bite 07/10/2020   Blurred vision 07/10/2020   Lethargy 07/10/2020   High risk medication use 04/06/2020   Panic disorder 07/28/2019   Bereavement 07/28/2019   MDD (major depressive disorder), recurrent episode, with atypical features (HCC) 07/28/2019   Agoraphobia 07/28/2019   Insomnia due to other mental disorder 07/28/2019   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   Tobacco use disorder 12/16/2017   Anxiety 06/25/2015   Carpal tunnel syndrome 06/25/2015   Family planning 06/25/2015   Bloodgood disease 06/25/2015   Fibroid 06/25/2015   Acid reflux 06/25/2015   Excess, menstruation 06/25/2015   Obesity (BMI 30-39.9) 06/25/2015   Class 3 severe obesity due to excess calories with serious comorbidity and body mass index (BMI) of 40.0 to 44.9 in adult [facility]) 06/25/2015   Dysmenorrhea 06/25/2015   Disease of thyroid gland 06/25/2015   Compulsive tobacco user syndrome 06/25/2015   Episodic tension type headache 08/23/2014   Allergic rhinitis 07/23/2013   Wheezing 07/23/2013   Lower urinary tract infectious disease 03/11/2013   Hematuria 03/11/2013  D (diarrhea) 01/13/2013   Vomiting 01/13/2013   Amenorrhea 12/22/2012   HLD (hyperlipidemia) 08/31/2011    ONSET DATE: 07/29/23  REFERRING DIAG: H81.10 (ICD-10-CM) - Benign paroxysmal positional vertigo   THERAPY DIAG:  Dizziness and giddiness  Unsteadiness on feet  Rationale for Evaluation and Treatment: Rehabilitation  PERTINENT HISTORY: 07/29/23  PRECAUTIONS: None  SUBJECTIVE: No new changes. Patient reports she has a migraine currently.   PAIN:  Are you having pain? Yes: NPRS scale: 4/10 Pain location: Front Head Pain description: Headache/Migraine   OBJECTIVE: DVA:  Static: Line 3 Dynamic: Line 3 (increased  dizziness) Pt reports has been some time since optometry evaluation, encouraged getting her eyes checked due to vision changes.     [facility] PT Assessment - 09/04/23 0001       Functional Gait  Assessment   Gait assessed  Yes    Gait Level Surface Walks 20 ft in less than 5.5 sec, no assistive devices, good speed, no evidence for imbalance, normal gait pattern, deviates no more than 6 in outside of the 12 in walkway width.    Change in Gait Speed Able to smoothly change walking speed without loss of balance or gait deviation. Deviate no more than 6 in outside of the 12 in walkway width.    Gait with Horizontal Head Turns Performs head turns smoothly with slight change in gait velocity (eg, minor disruption to smooth gait path), deviates 6-10 in outside 12 in walkway width, or uses an assistive device.    Gait with Vertical Head Turns Performs task with slight change in gait velocity (eg, minor disruption to smooth gait path), deviates 6 - 10 in outside 12 in walkway width or uses assistive device    Gait and Pivot Turn Pivot turns safely within 3 sec and stops quickly with no loss of balance.    Step Over Obstacle Is able to step over one shoe box (4.5 in total height) without changing gait speed. No evidence of imbalance.    Gait with Narrow Base of Support Ambulates 7-9 steps.    Gait with Eyes Closed Walks 20 ft, slow speed, abnormal gait pattern, evidence for imbalance, deviates 10-15 in outside 12 in walkway width. Requires more than 9 sec to ambulate 20 ft.    Ambulating Backwards Walks 20 ft, uses assistive device, slower speed, mild gait deviations, deviates 6-10 in outside 12 in walkway width.    Steps Alternating feet, must use rail.    Total Score 22            FOTO:   DPS: 43  DFS: 48.8  VESTIBULAR TREATMENT:  Gaze Adaptation: x1 Viewing Horizontal: Position: seated, Time: 30 seconds, and Reps: 2 and x1 Viewing Vertical:  Position: seated, Time: 30 seconds, and Reps:  2  Standing Balance: Surface: Airex Position: Narrow Base of Support Feet Hip Width Apart Completed with: Eyes Closed; 2 x 30 seconds with feet hip width apart. Then completed with narrow BOS, significant challenge noted.    Established following HEP:  URL: [email or web address] Date: 09/04/2023 Prepared by: Nehemiah Settle Fairly  Exercises - Standing Balance with Eyes Closed on Foam  - 1 x daily - 5 x weekly - 1 sets - 3 reps - 30 seconds hold - Walking with Head Rotation  - 1 x daily - 5 x weekly - 1 sets - 3 reps  PATIENT EDUCATION: Education details: FGA Results; Initial HEP Person educated: Patient Education method: Explanation Education comprehension: verbalized understanding   HOME EXERCISE PROGRAM:  Access Code: [email or web address]; Gaze Stabilization x 1   GOALS: Goals reviewed with patient? Yes   SHORT TERM GOALS: Target date: 09/26/2023   Pt will be independent with initial HEP for improved vestibular input and balance  Baseline: no HEP established Goal status: INITIAL   2.  FGA TBA and LTG to be set as applicable Baseline: 22/30 Goal status: MET   3.  Pt will report </= 3/5 for all movements on MSQ to indicate improvement in motion sensitivity and improved activity tolerance.  Baseline: 3-4/5 Goal status: INITIAL     LONG TERM GOALS: Target date: 10/24/2023   Pt will be independent with final HEP for improved vestibular input and balance  Baseline: no HEP established Goal status: INITIAL   2.  Pt will improve DVA to </= 2 line difference to indicate improved VOR Baseline: Line 3 for static/dynamic Goal status: DEFERRED   3.  Pt will report </= 2/5 for all movements on MSQ to indicate improvement in motion sensitivity and improved activity tolerance.  Baseline: 3-4/5 Goal status: INITIAL   4.  Pt will improve FGA by 4 points from baseline to demonstrate improved balance and reduced fall risk Baseline: 22/30 Goal status: INITIAL   ASSESSMENT:    CLINICAL IMPRESSION: Today's skilled PT session focused on further balance assessment with FGA, patient scoring 22/30 indicating low fall risk but did demonstrate increased balance challenge with those targeting vestibular input. Rest of session spent establishing initial HEP focused on dynamic balance and gaze stabilization. Limited in tolerance today due to migraine. Will continue to progress as tolerated toward goals.    OBJECTIVE IMPAIRMENTS: decreased activity tolerance, decreased balance, decreased knowledge of condition, dizziness, and pain.    ACTIVITY LIMITATIONS: bending, standing, squatting, bed mobility, reach over head, and locomotion level   PARTICIPATION LIMITATIONS: cleaning, driving, community activity, and yard work   PERSONAL FACTORS: Time since onset of injury/illness/exacerbation and 3+ comorbidities: Anxiety, Migraines, Carpal Tunnel, GERD, Depression, Hyperthyroidism  are also affecting patient's functional outcome.    REHAB POTENTIAL: Good   CLINICAL DECISION MAKING: Evolving/moderate complexity   EVALUATION COMPLEXITY: Moderate     PLAN:   PT FREQUENCY: 1x/week   PT DURATION: 8 weeks   PLANNED INTERVENTIONS: Therapeutic exercises, Therapeutic activity, Neuromuscular re-education, Balance training, Gait training, Patient/Family education, Self Care, Joint mobilization, Stair training, Vestibular training, Canalith repositioning, and Manual therapy   PLAN FOR NEXT SESSION: Continue saccades, gaze stabilization. Balance focused on vestibular input. Head turns, eyes closed.    Howie Ill, PT, DPT 09/04/23 11:56 AM

## 2023-09-09 ENCOUNTER — Ambulatory Visit: Payer: Medicare HMO

## 2023-09-11 ENCOUNTER — Ambulatory Visit: Payer: Medicare HMO

## 2023-09-16 ENCOUNTER — Ambulatory Visit: Payer: Medicare HMO

## 2023-09-18 ENCOUNTER — Ambulatory Visit: Payer: Medicare HMO

## 2023-09-23 ENCOUNTER — Ambulatory Visit: Payer: Medicare HMO

## 2023-09-25 ENCOUNTER — Ambulatory Visit: Payer: Medicare HMO

## 2023-09-30 ENCOUNTER — Other Ambulatory Visit: Payer: Self-pay | Admitting: Nurse Practitioner

## 2023-10-02 ENCOUNTER — Ambulatory Visit: Payer: Medicare HMO

## 2023-10-06 ENCOUNTER — Ambulatory Visit: Payer: Medicare HMO

## 2023-10-06 VITALS — BP 141/87 | HR 82 | Ht 66.0 in | Wt 232.0 lb

## 2023-10-06 DIAGNOSIS — Z3042 Encounter for surveillance of injectable contraceptive: Secondary | ICD-10-CM

## 2023-10-06 MED ORDER — MEDROXYPROGESTERONE ACETATE 150 MG/ML IM SUSY
150.0000 mg | PREFILLED_SYRINGE | Freq: Once | INTRAMUSCULAR | Status: AC
Start: 2023-10-06 — End: 2023-10-06
  Administered 2023-10-06: 150 mg via INTRAMUSCULAR

## 2023-10-06 NOTE — Progress Notes (Signed)
    NURSE VISIT NOTE  Subjective:    Patient ID: Makana Feigel, female    DOB: 08-Apr-1971, 52 y.o.   MRN: [id number]  HPI  Patient is a 52 y.o. G48P2002 female who presents for depo provera injection. Pt aware her BP was high in office stated she has medication and a PCP who manages her BP.  Objective:    BP (!) 141/87   Pulse 82   Ht 5\' 6"  (1.676 m)   Wt 232 lb (105.2 kg)   BMI 37.45 kg/m   Last Annual: 08/05/23. Last pap: 06/06/22. Last Depo-Provera: 07/26/23. Side Effects if any: none. Serum HCG indicated? No . Depo-Provera 150 mg IM given by: Beverely Pace, CMA. Site: Right Deltoid  Assessment:   1. Surveillance for Depo-Provera contraception      Plan:   Next appointment due between DEC 23-JAN 6      Loney Laurence, CMA

## 2023-10-07 ENCOUNTER — Ambulatory Visit: Payer: Medicare HMO

## 2023-10-08 DIAGNOSIS — H8110 Benign paroxysmal vertigo, unspecified ear: Secondary | ICD-10-CM | POA: Diagnosis not present

## 2023-10-08 DIAGNOSIS — D72829 Elevated white blood cell count, unspecified: Secondary | ICD-10-CM | POA: Diagnosis not present

## 2023-10-08 DIAGNOSIS — G43119 Migraine with aura, intractable, without status migrainosus: Secondary | ICD-10-CM | POA: Diagnosis not present

## 2023-10-08 DIAGNOSIS — R030 Elevated blood-pressure reading, without diagnosis of hypertension: Secondary | ICD-10-CM | POA: Diagnosis not present

## 2023-10-08 DIAGNOSIS — E538 Deficiency of other specified B group vitamins: Secondary | ICD-10-CM | POA: Diagnosis not present

## 2023-10-09 ENCOUNTER — Ambulatory Visit: Payer: Medicare HMO

## 2023-10-14 ENCOUNTER — Inpatient Hospital Stay: Payer: Medicare HMO

## 2023-10-14 ENCOUNTER — Ambulatory Visit: Payer: Medicare HMO

## 2023-10-14 ENCOUNTER — Encounter: Payer: Self-pay | Admitting: Oncology

## 2023-10-14 ENCOUNTER — Inpatient Hospital Stay: Payer: Medicare HMO | Attending: Oncology | Admitting: Oncology

## 2023-10-14 VITALS — BP 144/76 | HR 76 | Temp 97.0°F | Resp 16 | Ht 66.0 in | Wt 227.0 lb

## 2023-10-14 DIAGNOSIS — F17210 Nicotine dependence, cigarettes, uncomplicated: Secondary | ICD-10-CM | POA: Diagnosis not present

## 2023-10-14 DIAGNOSIS — Z801 Family history of malignant neoplasm of trachea, bronchus and lung: Secondary | ICD-10-CM | POA: Diagnosis not present

## 2023-10-14 DIAGNOSIS — Z803 Family history of malignant neoplasm of breast: Secondary | ICD-10-CM | POA: Diagnosis not present

## 2023-10-14 DIAGNOSIS — Z862 Personal history of diseases of the blood and blood-forming organs and certain disorders involving the immune mechanism: Secondary | ICD-10-CM | POA: Diagnosis not present

## 2023-10-14 DIAGNOSIS — D72829 Elevated white blood cell count, unspecified: Secondary | ICD-10-CM | POA: Diagnosis not present

## 2023-10-14 LAB — IRON AND TIBC
Iron: 46 ug/dL (ref 28–170)
Saturation Ratios: 12 % (ref 10.4–31.8)
TIBC: 391 ug/dL (ref 250–450)
UIBC: 345 ug/dL

## 2023-10-14 LAB — FOLATE: Folate: 10.8 ng/mL (ref >5.9)

## 2023-10-14 LAB — CBC (CANCER CENTER ONLY)
HCT: 46.1 % — ABNORMAL HIGH (ref 36.0–46.0)
Hemoglobin: 15 g/dL (ref 12.0–15.0)
MCH: 29.2 pg (ref 26.0–34.0)
MCHC: 32.5 g/dL (ref 30.0–36.0)
MCV: 89.9 fL (ref 80.0–100.0)
Platelet Count: 271 10*3/uL (ref 150–400)
RBC: 5.13 MIL/uL — ABNORMAL HIGH (ref 3.87–5.11)
RDW: 13.3 % (ref 11.5–15.5)
WBC Count: 11.1 10*3/uL — ABNORMAL HIGH (ref 4.0–10.5)
nRBC: 0 % (ref 0.0–0.2)

## 2023-10-14 LAB — FERRITIN: Ferritin: 92 ng/mL (ref 11–307)

## 2023-10-14 NOTE — Progress Notes (Signed)
[facility]  [phone] [phone]) [phone]  ID: Mary Koch OB: 03-Apr-1971  MR#: [id number]  UXL#:[id number]  Patient Care Team: Corky Downs, MD as PCP - General (Internal Medicine)  CHIEF COMPLAINT: Leukocytosis.  INTERVAL HISTORY: Patient is a 52 year old female with chronic migraine headaches who was noted to have a persistently elevated white blood cell count on routine blood work.  Upon review of patient's chart, noted her white blood cell count has been elevated since approximately May 2018.  She has mild symptoms of a migraine, but otherwise feels well.  She has no other neurologic complaints.  She denies any recent fevers or illnesses.  She has no new medications.  She has a good appetite and denies weight loss.  She has no chest pain, shortness of breath, cough, or hemoptysis.  She denies any nausea, vomiting, constipation, or diarrhea.  She has no urinary complaints.  Patient offers no specific complaints today.  REVIEW OF SYSTEMS:   Review of Systems  Constitutional: Negative.  Negative for fever, malaise/fatigue and weight loss.  Respiratory: Negative.  Negative for cough, hemoptysis and shortness of breath.   Cardiovascular: Negative.  Negative for chest pain and leg swelling.  Gastrointestinal: Negative.  Negative for abdominal pain.  Genitourinary: Negative.  Negative for dysuria.  Musculoskeletal: Negative.  Negative for back pain.  Skin: Negative.  Negative for rash.  Neurological:  Positive for headaches. Negative for dizziness, focal weakness and weakness.  Psychiatric/Behavioral: Negative.  The patient is not nervous/anxious.     As per HPI. Otherwise, a complete review of systems is negative.  PAST MEDICAL HISTORY: Past Medical History:  Diagnosis Date   Allergic rhinitis    Anemia    Anxiety    Anxiety and depression    Carpal tunnel syndrome    COVID-19 01/15/2020   Depression    Fibroids    GERD (gastroesophageal  reflux disease)    Headache(784.0)    Headache(784.0)    Heavy periods    Hyperthyroidism    right portion of thyroid removed   Muscle spasm    Neurological disorder    evaluation for ms   Painful menstrual periods    Shortness of breath    when i smoke a lot    PAST SURGICAL HISTORY: Past Surgical History:  Procedure Laterality Date   BREAST BIOPSY Left    neg   carpel tunnel Left 2017   CESAREAN SECTION     times 2   CHOLECYSTECTOMY     THYROID LOBECTOMY      FAMILY HISTORY: Family History  Problem Relation Age of Onset   Osteoporosis Mother    Breast cancer Mother 30   Diabetes Father    Anxiety disorder Father    Depression Father    Post-traumatic stress disorder Father    Heart disease Father    Lung cancer Father    Anxiety disorder Sister    Alcohol abuse Brother    Colon cancer Neg Hx    Ovarian cancer Neg Hx     ADVANCED DIRECTIVES (Y/N):  N  HEALTH MAINTENANCE: Social History   Tobacco Use   Smoking status: Every Day    Types: Cigarettes    Start date: 05/22/1986   Smokeless tobacco: Never   Tobacco comments:    Currently 1-2 cig per day - cut back- 03/25/23  Vaping Use   Vaping status: Never Used  Substance Use Topics   Alcohol use: No    Alcohol/week: 0.0 standard drinks  of alcohol   Drug use: No     Colonoscopy:  PAP:  Bone density:  Lipid panel:  Allergies  Allergen Reactions   Azithromycin Other (See Comments)    headache   Codeine Other (See Comments)    unknown   Penicillins Other (See Comments)    headaches    Current Outpatient Medications  Medication Sig Dispense Refill   albuterol (PROVENTIL) (2.5 MG/3ML) 0.083% nebulizer solution INHALE 3 ML BY NEBULIZATION EVERY 6 HOURS AS NEEDED FOR WHEEZING OR SHORTNESS OF BREATH 150 mL 1   albuterol (VENTOLIN HFA) 108 (90 Base) MCG/ACT inhaler INHALE 1 TO 2 PUFFS BY MOUTH DAILY 6.7 each 5   B-D 3CC LUER-LOK SYR 18GX1-1/2 18G X 1-1/2" 3 ML MISC USE AS DIRECTED TO DRAW UP B12      B-D DISP NEEDLE 25GX1" 25G X 1" MISC USE AS DIRECTED TO INJECT B12 INTO MUSCLE. USE FOR INJECT AFTER DRAW UP WITH 18 GAUGE NEEDLE.     BREO ELLIPTA 100-25 MCG/ACT AEPB TAKE 1 PUFF BY MOUTH EVERY DAY 60 each 4   buPROPion ER (WELLBUTRIN SR) 100 MG 12 hr tablet Take 1 tablet (100 mg total) by mouth daily with breakfast. 90 tablet 1   cyanocobalamin (VITAMIN B12) 1000 MCG/ML injection Inject 1,000 mcg into the skin once a week.     cyclobenzaprine (FLEXERIL) 10 MG tablet Take 10 mg by mouth 3 (three) times daily as needed for muscle spasms.     DULoxetine (CYMBALTA) 20 MG capsule TAKE 1 CAPSULE (20 MG TOTAL) BY MOUTH DAILY. TAKE ALONG WITH 60 MG DAILY , TOTAL OF 80 MG 90 capsule 1   DULoxetine (CYMBALTA) 60 MG capsule TAKE 1 CAPSULE BY MOUTH EVERY DAY, TAKE ALONG WITH 20 MG DAILY 90 capsule 1   gabapentin (NEURONTIN) 300 MG capsule Take one pill (300mg ) every morning and 2 pills (600mg ) in the evening     meloxicam (MOBIC) 7.5 MG tablet TAKE 1 TABLET BY MOUTH EVERY DAY 30 tablet 3   omeprazole (PRILOSEC) 40 MG capsule Take 1 capsule (40 mg total) by mouth daily. 90 capsule 3   propranolol ER (INDERAL LA) 60 MG 24 hr capsule Take 60 mg by mouth daily.     rosuvastatin (CRESTOR) 20 MG tablet TAKE 1 TABLET BY MOUTH EVERY DAY 90 tablet 3   topiramate (TOPAMAX) 100 MG tablet Take 100 mg by mouth daily.     varenicline (CHANTIX) 0.5 MG tablet TAKE 1 TABLET BY MOUTH 2 TIMES DAILY. 180 tablet 1   Semaglutide, 2 MG/DOSE, (OZEMPIC, 2 MG/DOSE,) 8 MG/3ML SOPN INJECT 2MG  UNDER THE SKIN ONCE WEEKLY (Patient not taking: Reported on 10/14/2023) 1 mL 2   No current facility-administered medications for this visit.    OBJECTIVE: Vitals:   10/14/23 1514  BP: (!) 144/76  Pulse: 76  Resp: 16  Temp: (!) 97 F (36.1 C)  SpO2: 100%     Body mass index is 36.64 kg/m.    ECOG FS:0 - Asymptomatic  General: Well-developed, well-nourished, no acute distress. Eyes: Pink conjunctiva, anicteric sclera. HEENT:  Normocephalic, moist mucous membranes. Lungs: No audible wheezing or coughing. Heart: Regular rate and rhythm. Abdomen: Soft, nontender, no obvious distention. Musculoskeletal: No edema, cyanosis, or clubbing. Neuro: Alert, answering all questions appropriately. Cranial nerves grossly intact. Skin: No rashes or petechiae noted. Psych: Normal affect. Lymphatics: No cervical, calvicular, axillary or inguinal LAD.   LAB RESULTS:  Lab Results  Component Value Date   NA 138 05/18/2017  K 4.5 05/18/2017   CL 108 05/18/2017   CO2 23 05/18/2017   GLUCOSE 110 (H) 05/18/2017   BUN 15 05/18/2017   CREATININE 0.85 05/18/2017   CALCIUM 9.2 05/18/2017   PROT 7.3 05/18/2017   ALBUMIN 3.7 05/18/2017   AST 24 05/18/2017   ALT 18 05/18/2017   ALKPHOS 66 05/18/2017   BILITOT 0.7 05/18/2017   GFRNONAA >60 05/18/2017   GFRAA >60 05/18/2017    Lab Results  Component Value Date   WBC 11.1 (H) 10/14/2023   HGB 15.0 10/14/2023   HCT 46.1 (H) 10/14/2023   MCV 89.9 10/14/2023   PLT 271 10/14/2023     STUDIES: No results found.  ASSESSMENT: Leukocytosis.  PLAN:    Leukocytosis: Patient's white blood cell count is mildly elevated today at 11.1.  Upon review of her chart it appears patient has had an elevated white blood cell count since May 2018, although lab values are infrequent.  All of her other laboratory work from today including flow cytometry and BCR-ABL mutation are pending at time of dictation.  No intervention is needed.  Patient does not require bone marrow biopsy.  Return to clinic in 3 weeks with video-assisted telemedicine visit for further evaluation and discussion of her results.  I spent a total of 45 minutes reviewing chart data, face-to-face evaluation with the patient, counseling and coordination of care as detailed above.   Patient expressed understanding and was in agreement with this plan. She also understands that She can call clinic at any time with any questions,  concerns, or complaints.    Jeralyn Ruths, MD   10/14/2023 4:25 PM

## 2023-10-15 LAB — VITAMIN B12: Vitamin B-12: 388 pg/mL (ref 180–914)

## 2023-10-16 ENCOUNTER — Ambulatory Visit (INDEPENDENT_AMBULATORY_CARE_PROVIDER_SITE_OTHER): Payer: Medicare HMO | Admitting: Psychiatry

## 2023-10-16 ENCOUNTER — Ambulatory Visit: Payer: Medicare HMO

## 2023-10-16 ENCOUNTER — Encounter: Payer: Self-pay | Admitting: Psychiatry

## 2023-10-16 VITALS — BP 136/93 | HR 76 | Temp 97.6°F | Ht 66.0 in | Wt 227.0 lb

## 2023-10-16 DIAGNOSIS — F4000 Agoraphobia, unspecified: Secondary | ICD-10-CM | POA: Diagnosis not present

## 2023-10-16 DIAGNOSIS — F3342 Major depressive disorder, recurrent, in full remission: Secondary | ICD-10-CM | POA: Diagnosis not present

## 2023-10-16 DIAGNOSIS — F1590 Other stimulant use, unspecified, uncomplicated: Secondary | ICD-10-CM | POA: Diagnosis not present

## 2023-10-16 DIAGNOSIS — G4701 Insomnia due to medical condition: Secondary | ICD-10-CM | POA: Diagnosis not present

## 2023-10-16 DIAGNOSIS — F17200 Nicotine dependence, unspecified, uncomplicated: Secondary | ICD-10-CM | POA: Diagnosis not present

## 2023-10-16 DIAGNOSIS — F410 Panic disorder [episodic paroxysmal anxiety] without agoraphobia: Secondary | ICD-10-CM | POA: Diagnosis not present

## 2023-10-16 MED ORDER — BREXPIPRAZOLE 0.25 MG PO TABS
0.2500 mg | ORAL_TABLET | Freq: Every day | ORAL | 1 refills | Status: DC
Start: 2023-10-16 — End: 2024-04-01

## 2023-10-16 MED ORDER — BUPROPION HCL ER (SR) 100 MG PO TB12
100.0000 mg | ORAL_TABLET | Freq: Every day | ORAL | 1 refills | Status: DC
Start: 2023-10-16 — End: 2024-04-01

## 2023-10-16 MED ORDER — DULOXETINE HCL 60 MG PO CPEP
ORAL_CAPSULE | ORAL | 1 refills | Status: DC
Start: 2023-10-16 — End: 2024-04-01

## 2023-10-16 MED ORDER — VARENICLINE TARTRATE 1 MG PO TABS
1.0000 mg | ORAL_TABLET | Freq: Two times a day (BID) | ORAL | 2 refills | Status: DC
Start: 2023-10-16 — End: 2024-01-18

## 2023-10-16 MED ORDER — DULOXETINE HCL 20 MG PO CPEP
20.0000 mg | ORAL_CAPSULE | Freq: Every day | ORAL | 1 refills | Status: DC
Start: 2023-10-16 — End: 2024-04-01

## 2023-10-16 NOTE — Progress Notes (Signed)
BH MD OP Progress Note  10/16/2023 4:24 PM Mary Koch  MRN:  [id number]  Chief Complaint:  Chief Complaint  Patient presents with   Follow-up   Depression   Anxiety   Medication Refill   HPI: Mary Koch is a 52 year old Caucasian female who lives in [place], has a history of MDD, panic disorder, agoraphobia, insomnia, tobacco use disorder, caffeine use disorder was evaluated in office today.  Patient today reports she is currently struggling with worsening depression and anxiety symptoms.  She struggles with sadness, low motivation, low energy, excessive sleepiness during the day, sleep problems at night.  Patient also reports anxiety, worrying about her mother, her brother who are currently struggling with cancer.  Her mother has breast cancer and is about to get a mastectomy.  Patient reports her brother is currently going through chemotherapy for his lung cancer.  She has has been having a lot of anxiety, has trouble relaxing, and has racing thoughts.  Patient reports she has difficulty sleeping at night, she cannot stay in bed the whole day and sleeps all day if she wants to.  This has been going on since the past few weeks.  Patient currently denies any suicidality, homicidality or perceptual disturbances.  She is currently compliant on her duloxetine, Wellbutrin.  Denies side effects.  Patient continues to smoke cigarettes and does not believe the Chantix at this dosage has helped.  In agreeable to dosage increase.  Patient continues to follow-up with her other providers continues to be in chronic pain currently on medications including gabapentin for the same.  Patient denies any other concerns today.  Visit Diagnosis:    ICD-10-CM   1. MDD (major depressive disorder), recurrent, in full remission (HCC)  F33.42 brexpiprazole (REXULTI) 0.25 MG TABS tablet    buPROPion ER (WELLBUTRIN SR) 100 MG 12 hr tablet    DULoxetine (CYMBALTA) 20 MG capsule    2. Panic disorder   F41.0 DULoxetine (CYMBALTA) 60 MG capsule    3. Agoraphobia  F40.00     4. Insomnia due to medical condition  G47.01    Lack of sleep hygiene    5. Tobacco use disorder  F17.200 varenicline (CHANTIX) 1 MG tablet    6. Caffeine use disorder  F15.90       Past Psychiatric History: I have past psychiatric history from progress note on 03/24/2018.  Past trials of Paxil, Luvox, Klonopin, Zoloft, Seroquel, Wellbutrin, Ambien.  Past Medical History:  Past Medical History:  Diagnosis Date   Allergic rhinitis    Anemia    Anxiety    Anxiety and depression    Carpal tunnel syndrome    COVID-19 01/15/2020   Depression    Fibroids    GERD (gastroesophageal reflux disease)    Headache(784.0)    Headache(784.0)    Heavy periods    Hyperthyroidism    right portion of thyroid removed   Muscle spasm    Neurological disorder    evaluation for ms   Painful menstrual periods    Shortness of breath    when i smoke a lot    Past Surgical History:  Procedure Laterality Date   BREAST BIOPSY Left    neg   carpel tunnel Left 2017   CESAREAN SECTION     times 2   CHOLECYSTECTOMY     THYROID LOBECTOMY      Family Psychiatric History: I have reviewed family psychiatric history from progress note on 03/24/2018.  Family History:  Family  History  Problem Relation Age of Onset   Osteoporosis Mother    Breast cancer Mother 41   Diabetes Father    Anxiety disorder Father    Depression Father    Post-traumatic stress disorder Father    Heart disease Father    Lung cancer Father    Anxiety disorder Sister    Alcohol abuse Brother    Colon cancer Neg Hx    Ovarian cancer Neg Hx     Social History: I have reviewed social history from progress note on 03/24/2018. Social History   Socioeconomic History   Marital status: Legally Separated    Spouse name: Not on file   Number of children: 2   Years of education: Not on file   Highest education level: High school graduate  Occupational  History   Not on file  Tobacco Use   Smoking status: Every Day    Types: Cigarettes    Start date: 05/22/1986   Smokeless tobacco: Never   Tobacco comments:    Currently 1-2 cig per day - cut back- 03/25/23  Vaping Use   Vaping status: Never Used  Substance and Sexual Activity   Alcohol use: No    Alcohol/week: 0.0 standard drinks of alcohol   Drug use: No   Sexual activity: Yes    Partners: Male    Birth control/protection: Surgical, Injection  Other Topics Concern   Not on file  Social History Narrative   Not on file   Social Determinants of Health   Financial Resource Strain: Low Risk  (11/18/2022)   Overall Financial Resource Strain (CARDIA)    Difficulty of Paying Living Expenses: Not hard at all  Food Insecurity: No Food Insecurity (10/14/2023)   Hunger Vital Sign    Worried About Running Out of Food in the Last Year: Never true    Ran Out of Food in the Last Year: Never true  Transportation Needs: No Transportation Needs (10/14/2023)   PRAPARE - Administrator, Civil Service (Medical): No    Lack of Transportation (Non-Medical): No  Physical Activity: Insufficiently Active (11/18/2022)   Exercise Vital Sign    Days of Exercise per Week: 2 days    Minutes of Exercise per Session: 20 min  Stress: No Stress Concern Present (11/18/2022)   [employer] of Occupational Health - Occupational Stress Questionnaire    Feeling of Stress : Only a little  Social Connections: Socially Isolated (11/18/2022)   Social Connection and Isolation Panel [NHANES]    Frequency of Communication with Friends and Family: More than three times a week    Frequency of Social Gatherings with Friends and Family: Never    Attends Religious Services: Never    Database administrator or Organizations: No    Attends Banker Meetings: Never    Marital Status: Separated    Allergies:  Allergies  Allergen Reactions   Azithromycin Other (See Comments)    headache    Codeine Other (See Comments)    unknown   Penicillins Other (See Comments)    headaches    Metabolic Disorder Labs: Lab Results  Component Value Date   HGBA1C 5.0 08/02/2022   Lab Results  Component Value Date   PROLACTIN 4.3 03/29/2022   Lab Results  Component Value Date   CHOL 155 03/29/2022   TRIG 419 (H) 03/29/2022   HDL 26 (L) 03/29/2022   CHOLHDL 6.0 (H) 03/29/2022   LDLCALC  03/29/2022     Comment:     .  LDL cholesterol not calculated. Triglyceride levels greater than 400 mg/dL invalidate calculated LDL results. . Reference range: <100 . Desirable range <100 mg/dL for primary prevention;   <70 mg/dL for patients with CHD or diabetic patients  with > or = 2 CHD risk factors. Marland Kitchen LDL-C is now calculated using the Martin-Hopkins  calculation, which is a validated novel method providing  better accuracy than the Friedewald equation in the  estimation of LDL-C.  Horald Pollen et al. Lenox Ahr. 1610;960(45): 2061-2068  (http://education.[employer].com/faq/FAQ164)    Lab Results  Component Value Date   TSH 1.438 09/06/2021    Therapeutic Level Labs: No results found for: "LITHIUM" No results found for: "VALPROATE" No results found for: "CBMZ"  Current Medications: Current Outpatient Medications  Medication Sig Dispense Refill   albuterol (PROVENTIL) (2.5 MG/3ML) 0.083% nebulizer solution INHALE 3 ML BY NEBULIZATION EVERY 6 HOURS AS NEEDED FOR WHEEZING OR SHORTNESS OF BREATH 150 mL 1   albuterol (VENTOLIN HFA) 108 (90 Base) MCG/ACT inhaler INHALE 1 TO 2 PUFFS BY MOUTH DAILY 6.7 each 5   B-D 3CC LUER-LOK SYR 18GX1-1/2 18G X 1-1/2" 3 ML MISC USE AS DIRECTED TO DRAW UP B12     B-D DISP NEEDLE 25GX1" 25G X 1" MISC USE AS DIRECTED TO INJECT B12 INTO MUSCLE. USE FOR INJECT AFTER DRAW UP WITH 18 GAUGE NEEDLE.     BREO ELLIPTA 100-25 MCG/ACT AEPB TAKE 1 PUFF BY MOUTH EVERY DAY 60 each 4   brexpiprazole (REXULTI) 0.25 MG TABS tablet Take 1 tablet (0.25 mg total) by mouth  daily. 30 tablet 1   cyanocobalamin (VITAMIN B12) 1000 MCG/ML injection Inject 1,000 mcg into the skin once a week.     cyclobenzaprine (FLEXERIL) 10 MG tablet Take 10 mg by mouth 3 (three) times daily as needed for muscle spasms.     gabapentin (NEURONTIN) 300 MG capsule Take one pill (300mg ) every morning and 2 pills (600mg ) in the evening     meloxicam (MOBIC) 7.5 MG tablet TAKE 1 TABLET BY MOUTH EVERY DAY 30 tablet 3   omeprazole (PRILOSEC) 40 MG capsule Take 1 capsule (40 mg total) by mouth daily. 90 capsule 3   propranolol ER (INDERAL LA) 60 MG 24 hr capsule Take 60 mg by mouth daily.     rosuvastatin (CRESTOR) 20 MG tablet TAKE 1 TABLET BY MOUTH EVERY DAY 90 tablet 3   Semaglutide, 2 MG/DOSE, (OZEMPIC, 2 MG/DOSE,) 8 MG/3ML SOPN INJECT 2MG  UNDER THE SKIN ONCE WEEKLY 1 mL 2   topiramate (TOPAMAX) 100 MG tablet Take 100 mg by mouth daily.     varenicline (CHANTIX) 1 MG tablet Take 1 tablet (1 mg total) by mouth 2 (two) times daily. 60 tablet 2   buPROPion ER (WELLBUTRIN SR) 100 MG 12 hr tablet Take 1 tablet (100 mg total) by mouth daily with breakfast. 90 tablet 1   DULoxetine (CYMBALTA) 20 MG capsule Take 1 capsule (20 mg total) by mouth daily. Take along with 60 mg daily , total of 80 mg 90 capsule 1   DULoxetine (CYMBALTA) 60 MG capsule TAKE 1 CAPSULE BY MOUTH EVERY DAY, take along with 20 mg daily 90 capsule 1   Ubrogepant 100 MG TABS Take by mouth. (Patient not taking: Reported on 10/16/2023)     No current facility-administered medications for this visit.     Musculoskeletal: Strength & Muscle Tone: within normal limits Gait & Station: normal Patient leans: N/A  Psychiatric Specialty Exam: Review of Systems  Psychiatric/Behavioral:  Positive for  dysphoric mood and sleep disturbance. The patient is nervous/anxious.     Blood pressure (!) 136/93, pulse 76, temperature 97.6 F (36.4 C), temperature source Skin, height 5\' 6"  (1.676 m), weight 227 lb (103 kg).Body mass index is  36.64 kg/m.  General Appearance: Fairly Groomed  Eye Contact:  Fair  Speech:  Clear and Coherent  Volume:  Normal  Mood:  Anxious and Depressed  Affect:  Congruent  Thought Process:  Goal Directed and Descriptions of Associations: Intact  Orientation:  Full (Time, Place, and Person)  Thought Content: Logical   Suicidal Thoughts:  No  Homicidal Thoughts:  No  Memory:  Immediate;   Fair Recent;   Fair Remote;   Fair  Judgement:  Fair  Insight:  Fair  Psychomotor Activity:  Normal  Concentration:  Concentration: Fair and Attention Span: Fair  Recall:  [employer] of Knowledge: Fair  Language: Fair  Akathisia:  No  Handed:  Right  AIMS (if indicated): not done  Assets:  Communication Skills Desire for Improvement Housing Social Support  ADL's:  Intact  Cognition: WNL  Sleep:  Poor   Screenings: AIMS    Flowsheet Row Video Visit from 08/22/2022 in [facility] Video Visit from 05/22/2022 in [facility]  AIMS Total Score 0 0      AUDIT    Flowsheet Row Office Visit from 05/22/2016 in [facility]  Alcohol Use Disorder Identification Test Final Score (AUDIT) 0      GAD-7    Flowsheet Row Office Visit from 10/16/2023 in [facility] Video Visit from 08/22/2022 in [facility] Counselor from 03/05/2022 in [facility] Counselor from 10/06/2020 in [facility] Office Visit from 08/10/2019 in [facility]  Total GAD-7 Score 16 11 20 15 14       Mini-Mental    Flowsheet Row Clinical Support from 09/27/2021 in [facility]  Total Score (max 30 points ) 29      PHQ2-9    Flowsheet Row Office Visit from 10/16/2023 in [facility]  Office Visit from 10/14/2023 in [facility] Video Visit from 12/24/2022 in [facility] Clinical Support from 11/18/2022 in [facility] Office Visit from 11/01/2022 in [facility]  PHQ-2 Total Score 5 2 2 3  0  PHQ-9 Total Score 19 -- 9 3 6       Flowsheet Row Office Visit from 10/16/2023 in [facility] Counselor from 07/15/2023 in [facility] Video Visit from 05/21/2023 in [facility]  C-SSRS RISK CATEGORY No Risk No Risk No Risk        Assessment and Plan: Orine Goga is a 52 year old Caucasian female who has a history of MDD, panic attacks, tobacco use disorder, caffeine use disorder, insomnia was evaluated in office today.  Patient currently with worsening mood symptoms, multiple psychosocial stressors including cancer diagnosis in her mother and brother, which does have an impact on her mood, will benefit from medication readjustment and reestablishing care with a therapist.  Plan as noted below.  Plan MDD-unstable Cymbalta 80 mg p.o. daily Wellbutrin SR 100 mg p.o. daily in the morning Start Rexulti 0.25 mg p.o. daily.  She tolerated this dose well before. Patient advised to reestablish care with therapist, patient to communicate with staff here at the practice.  Panic disorder/agoraphobia-unstable Patient advised to reestablish care with therapist Cymbalta 80 mg p.o. daily Continue gabapentin as prescribed by neurology  Insomnia-unstable We will consider adding a sleep medication in the future Added Rexulti as noted above Patient to work on sleep hygiene and to avoid excessive naps during the day.  Tobacco use disorder-unstable Increase Chantix to 1 mg p.o. twice daily Provided counseling for 5 minutes.  Caffeine  disorder-improving Will monitor closely  Follow-up in clinic in 4 weeks or sooner in person.   Collaboration of Care: Collaboration of Care: Referral or follow-up with counselor/therapist AEB patient encouraged to establish care with therapist.  Patient/Guardian was advised Release of Information must be obtained prior to any record release in order to collaborate their care with an outside provider. Patient/Guardian was advised if they have not already done so to contact the registration department to sign all necessary forms in order for [place] to release information regarding their care.   Consent: Patient/Guardian gives verbal consent for treatment and assignment of benefits for services provided during this visit. Patient/Guardian expressed understanding and agreed to proceed.   This note was generated in part or whole with voice recognition software. Voice recognition is usually quite accurate but there are transcription errors that can and very often do occur. I apologize for any typographical errors that were not detected and corrected.    Jomarie Longs, MD 10/16/2023, 4:24 PM

## 2023-10-20 LAB — COMP PANEL: LEUKEMIA/LYMPHOMA

## 2023-10-20 LAB — BCR-ABL1, CML/ALL, PCR, QUANT
E1A2 Transcript: <0.0032 %
Interpretation (BCRAL):: NEGATIVE
b2a2 transcript: <0.0032 %
b3a2 transcript: <0.0032 %

## 2023-10-21 ENCOUNTER — Ambulatory Visit: Payer: Medicare HMO

## 2023-10-23 ENCOUNTER — Ambulatory Visit: Payer: Medicare HMO

## 2023-10-30 ENCOUNTER — Ambulatory Visit: Payer: Medicare HMO

## 2023-11-03 DIAGNOSIS — R7303 Prediabetes: Secondary | ICD-10-CM | POA: Diagnosis not present

## 2023-11-03 DIAGNOSIS — R232 Flushing: Secondary | ICD-10-CM | POA: Diagnosis not present

## 2023-11-03 DIAGNOSIS — J45909 Unspecified asthma, uncomplicated: Secondary | ICD-10-CM | POA: Diagnosis not present

## 2023-11-03 DIAGNOSIS — F419 Anxiety disorder, unspecified: Secondary | ICD-10-CM | POA: Diagnosis not present

## 2023-11-07 ENCOUNTER — Telehealth: Payer: Medicare HMO | Admitting: Oncology

## 2023-11-10 ENCOUNTER — Encounter: Payer: Self-pay | Admitting: Oncology

## 2023-11-10 ENCOUNTER — Inpatient Hospital Stay: Payer: Medicare HMO | Attending: Oncology | Admitting: Oncology

## 2023-11-10 DIAGNOSIS — Z803 Family history of malignant neoplasm of breast: Secondary | ICD-10-CM | POA: Diagnosis not present

## 2023-11-10 DIAGNOSIS — Z801 Family history of malignant neoplasm of trachea, bronchus and lung: Secondary | ICD-10-CM | POA: Diagnosis not present

## 2023-11-10 DIAGNOSIS — D72829 Elevated white blood cell count, unspecified: Secondary | ICD-10-CM

## 2023-11-10 DIAGNOSIS — F17210 Nicotine dependence, cigarettes, uncomplicated: Secondary | ICD-10-CM | POA: Diagnosis not present

## 2023-11-10 NOTE — Progress Notes (Unsigned)
[facility]  Telephone:([phone]) [phone] [phone]) [phone]  ID: Mary Koch OB: 06/14/1971  MR#: [id number]  WUX#:[id number]  Patient Care Team: Corky Downs, MD as PCP - General (Internal Medicine)  I connected with Mary Koch on 11/11/23 at  3:30 PM EST by video enabled telemedicine visit and verified that I am speaking with the correct person using two identifiers.   I discussed the limitations, risks, security and privacy concerns of performing an evaluation and management service by telemedicine and the availability of in-person appointments. I also discussed with the patient that there may be a patient responsible charge related to this service. The patient expressed understanding and agreed to proceed.   Other persons participating in the visit and their role in the encounter: Patient, MD.  Patient's location: Home. Provider's location: Clinic.  CHIEF COMPLAINT: Leukocytosis.  INTERVAL HISTORY: Patient agreed to video-assisted telemedicine visit for further evaluation and discussion of her laboratory results.  She currently feels well and is asymptomatic.  She does not complain of migraine symptoms today.  She has no neurologic complaints.  She denies any recent fevers or illnesses. She has a good appetite and denies weight loss.  She has no chest pain, shortness of breath, cough, or hemoptysis.  She denies any nausea, vomiting, constipation, or diarrhea.  She has no urinary complaints.  Patient offers no specific complaints today.  REVIEW OF SYSTEMS:   Review of Systems  Constitutional: Negative.  Negative for fever, malaise/fatigue and weight loss.  Respiratory: Negative.  Negative for cough, hemoptysis and shortness of breath.   Cardiovascular: Negative.  Negative for chest pain and leg swelling.  Gastrointestinal: Negative.  Negative for abdominal pain.  Genitourinary: Negative.  Negative for dysuria.  Musculoskeletal: Negative.  Negative for back pain.   Skin: Negative.  Negative for rash.  Neurological: Negative.  Negative for dizziness, focal weakness, weakness and headaches.  Psychiatric/Behavioral: Negative.  The patient is not nervous/anxious.     As per HPI. Otherwise, a complete review of systems is negative.  PAST MEDICAL HISTORY: Past Medical History:  Diagnosis Date   Allergic rhinitis    Anemia    Anxiety    Anxiety and depression    Carpal tunnel syndrome    COVID-19 01/15/2020   Depression    Fibroids    GERD (gastroesophageal reflux disease)    Headache(784.0)    Headache(784.0)    Heavy periods    Hyperthyroidism    right portion of thyroid removed   Muscle spasm    Neurological disorder    evaluation for ms   Painful menstrual periods    Shortness of breath    when i smoke a lot    PAST SURGICAL HISTORY: Past Surgical History:  Procedure Laterality Date   BREAST BIOPSY Left    neg   carpel tunnel Left 2017   CESAREAN SECTION     times 2   CHOLECYSTECTOMY     THYROID LOBECTOMY      FAMILY HISTORY: Family History  Problem Relation Age of Onset   Osteoporosis Mother    Breast cancer Mother 84   Diabetes Father    Anxiety disorder Father    Depression Father    Post-traumatic stress disorder Father    Heart disease Father    Lung cancer Father    Anxiety disorder Sister    Alcohol abuse Brother    Colon cancer Neg Hx    Ovarian cancer Neg Hx     ADVANCED DIRECTIVES (Y/N):  N  HEALTH MAINTENANCE: Social History   Tobacco Use   Smoking status: Every Day    Types: Cigarettes    Start date: 05/22/1986   Smokeless tobacco: Never   Tobacco comments:    Currently 1-2 cig per day - cut back- 03/25/23  Vaping Use   Vaping status: Never Used  Substance Use Topics   Alcohol use: No    Alcohol/week: 0.0 standard drinks of alcohol   Drug use: No     Colonoscopy:  PAP:  Bone density:  Lipid panel:  Allergies  Allergen Reactions   Azithromycin Other (See Comments)    headache    Codeine Other (See Comments)    unknown   Penicillins Other (See Comments)    headaches    Current Outpatient Medications  Medication Sig Dispense Refill   albuterol (PROVENTIL) (2.5 MG/3ML) 0.083% nebulizer solution INHALE 3 ML BY NEBULIZATION EVERY 6 HOURS AS NEEDED FOR WHEEZING OR SHORTNESS OF BREATH 150 mL 1   albuterol (VENTOLIN HFA) 108 (90 Base) MCG/ACT inhaler INHALE 1 TO 2 PUFFS BY MOUTH DAILY 6.7 each 5   B-D 3CC LUER-LOK SYR 18GX1-1/2 18G X 1-1/2" 3 ML MISC USE AS DIRECTED TO DRAW UP B12     B-D DISP NEEDLE 25GX1" 25G X 1" MISC USE AS DIRECTED TO INJECT B12 INTO MUSCLE. USE FOR INJECT AFTER DRAW UP WITH 18 GAUGE NEEDLE.     BREO ELLIPTA 100-25 MCG/ACT AEPB TAKE 1 PUFF BY MOUTH EVERY DAY 60 each 4   brexpiprazole (REXULTI) 0.25 MG TABS tablet Take 1 tablet (0.25 mg total) by mouth daily. 30 tablet 1   buPROPion ER (WELLBUTRIN SR) 100 MG 12 hr tablet Take 1 tablet (100 mg total) by mouth daily with breakfast. 90 tablet 1   cyanocobalamin (VITAMIN B12) 1000 MCG/ML injection Inject 1,000 mcg into the skin once a week.     cyclobenzaprine (FLEXERIL) 10 MG tablet Take 10 mg by mouth 3 (three) times daily as needed for muscle spasms.     DULoxetine (CYMBALTA) 20 MG capsule Take 1 capsule (20 mg total) by mouth daily. Take along with 60 mg daily , total of 80 mg 90 capsule 1   DULoxetine (CYMBALTA) 60 MG capsule TAKE 1 CAPSULE BY MOUTH EVERY DAY, take along with 20 mg daily 90 capsule 1   gabapentin (NEURONTIN) 300 MG capsule Take one pill (300mg ) every morning and 2 pills (600mg ) in the evening     meloxicam (MOBIC) 7.5 MG tablet TAKE 1 TABLET BY MOUTH EVERY DAY 30 tablet 3   omeprazole (PRILOSEC) 40 MG capsule Take 1 capsule (40 mg total) by mouth daily. 90 capsule 3   propranolol ER (INDERAL LA) 60 MG 24 hr capsule Take 60 mg by mouth daily.     rosuvastatin (CRESTOR) 20 MG tablet TAKE 1 TABLET BY MOUTH EVERY DAY 90 tablet 3   Semaglutide, 2 MG/DOSE, (OZEMPIC, 2 MG/DOSE,) 8 MG/3ML SOPN  INJECT 2MG  UNDER THE SKIN ONCE WEEKLY 1 mL 2   topiramate (TOPAMAX) 100 MG tablet Take 100 mg by mouth daily.     Ubrogepant 100 MG TABS Take by mouth. (Patient not taking: Reported on 10/16/2023)     varenicline (CHANTIX) 1 MG tablet Take 1 tablet (1 mg total) by mouth 2 (two) times daily. 60 tablet 2   No current facility-administered medications for this visit.    OBJECTIVE: There were no vitals filed for this visit.    There is no height or weight on file  to calculate BMI.    ECOG FS:0 - Asymptomatic  General: Well-developed, well-nourished, no acute distress. HEENT: Normocephalic. Neuro: Alert, answering all questions appropriately. Cranial nerves grossly intact. Psych: Normal affect.  LAB RESULTS:  Lab Results  Component Value Date   NA 138 05/18/2017   K 4.5 05/18/2017   CL 108 05/18/2017   CO2 23 05/18/2017   GLUCOSE 110 (H) 05/18/2017   BUN 15 05/18/2017   CREATININE 0.85 05/18/2017   CALCIUM 9.2 05/18/2017   PROT 7.3 05/18/2017   ALBUMIN 3.7 05/18/2017   AST 24 05/18/2017   ALT 18 05/18/2017   ALKPHOS 66 05/18/2017   BILITOT 0.7 05/18/2017   GFRNONAA >60 05/18/2017   GFRAA >60 05/18/2017    Lab Results  Component Value Date   WBC 11.1 (H) 10/14/2023   HGB 15.0 10/14/2023   HCT 46.1 (H) 10/14/2023   MCV 89.9 10/14/2023   PLT 271 10/14/2023     STUDIES: No results found.  ASSESSMENT: Leukocytosis.  PLAN:    Leukocytosis: Patient's most recent white blood cell count is only mildly elevated at 11.1.  Upon review of her chart it appears patient has had an elevated white blood cell count since May 2018, although lab values are infrequent.  All of her other laboratory work from today including flow cytometry and BCR-ABL mutation are either negative or within normal limits.  No intervention is needed at this time patient does not require bone marrow biopsy.  After discussion with the patient, is agreed upon that no further follow-up is necessary.  I  provided 20 minutes of face-to-face video visit time during this encounter which included chart review, counseling, and coordination of care as documented above.   Patient expressed understanding and was in agreement with this plan. She also understands that She can call clinic at any time with any questions, concerns, or complaints.    Jeralyn Ruths, MD   11/11/2023 10:39 AM

## 2023-11-17 ENCOUNTER — Telehealth (INDEPENDENT_AMBULATORY_CARE_PROVIDER_SITE_OTHER): Payer: Medicare HMO | Admitting: Psychiatry

## 2023-11-17 ENCOUNTER — Encounter: Payer: Self-pay | Admitting: Psychiatry

## 2023-11-17 DIAGNOSIS — F331 Major depressive disorder, recurrent, moderate: Secondary | ICD-10-CM | POA: Diagnosis not present

## 2023-11-17 DIAGNOSIS — Z79899 Other long term (current) drug therapy: Secondary | ICD-10-CM | POA: Diagnosis not present

## 2023-11-17 DIAGNOSIS — F410 Panic disorder [episodic paroxysmal anxiety] without agoraphobia: Secondary | ICD-10-CM

## 2023-11-17 DIAGNOSIS — F4000 Agoraphobia, unspecified: Secondary | ICD-10-CM

## 2023-11-17 DIAGNOSIS — F1590 Other stimulant use, unspecified, uncomplicated: Secondary | ICD-10-CM | POA: Diagnosis not present

## 2023-11-17 DIAGNOSIS — G4700 Insomnia, unspecified: Secondary | ICD-10-CM

## 2023-11-17 DIAGNOSIS — F17210 Nicotine dependence, cigarettes, uncomplicated: Secondary | ICD-10-CM | POA: Diagnosis not present

## 2023-11-17 DIAGNOSIS — F17200 Nicotine dependence, unspecified, uncomplicated: Secondary | ICD-10-CM

## 2023-11-17 DIAGNOSIS — G4701 Insomnia due to medical condition: Secondary | ICD-10-CM

## 2023-11-17 NOTE — Progress Notes (Unsigned)
Virtual Visit via Video Note  I connected with Mary Koch on 11/17/23 at  3:30 PM EST by a video enabled telemedicine application and verified that I am speaking with the correct person using two identifiers.  Location Provider Location : [facility] Patient Location : Home  Participants: Patient , Provider    I discussed the limitations of evaluation and management by telemedicine and the availability of in person appointments. The patient expressed understanding and agreed to proceed.   I discussed the assessment and treatment plan with the patient. The patient was provided an opportunity to ask questions and all were answered. The patient agreed with the plan and demonstrated an understanding of the instructions.   The patient was advised to call back or seek an in-person evaluation if the symptoms worsen or if the condition fails to improve as anticipated.   [facility] MD OP Progress Note  11/18/2023 2:34 PM Ahlani Zoii Kalin  MRN:  [id number]  Chief Complaint:  Chief Complaint  Patient presents with   Follow-up   Depression   Anxiety   Medication Refill   HPI: Mary Koch is a 52 year old Caucasian female who lives in [place], has a history of MDD, panic disorder, agoraphobia, insomnia, tobacco use disorder, caffeine use disorder was evaluated by telemedicine today.  Patient today reports she is currently improving on the addition of Rexulti.  Her sadness, mood swings has improved.  She feels more motivated to do things.  She reports she continues to have a lot of anxiety due to her situational stressors.  Her mother has breast cancer and her brother is currently going through chemotherapy for lung cancer.  She does have racing thoughts at night which wakes her up.  She has been playing games on her phone at night which helps to calm her down.  She reports what she is able to fall asleep she is able to stay asleep.  She is not able to afford psychotherapy visits.  Patient denies any  suicidality, homicidality or perceptual disturbances.  She continues to smoke cigarettes, she is on Chantix.  Receptive to counseling.  She is compliant on duloxetine, Wellbutrin.  Denies side effects.  Patient currently denies any other concerns today.  Visit Diagnosis:    ICD-10-CM   1. MDD (major depressive disorder), recurrent episode, moderate (HCC)  F33.1    improving    2. Panic disorder  F41.0     3. Agoraphobia  F40.00     4. Insomnia due to medical condition  G47.01    Lack of sleep hygiene    5. Tobacco use disorder  F17.200     6. 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 High risk medication use  Z79.899       Past Psychiatric History: I have reviewed past psychiatric history from progress note on 03/24/2018.  Past trials of Paxil, Luvox, Klonopin, Zoloft, Seroquel, Wellbutrin, Ambien.  Past Medical History:  Past Medical History:  Diagnosis Date   Allergic rhinitis    Anemia    Anxiety    Anxiety and depression    Carpal tunnel syndrome    COVID-19 01/15/2020   Depression    Fibroids    GERD (gastroesophageal reflux disease)    Headache(784.0)    Headache(784.0)    Heavy periods    Hyperthyroidism    right portion of thyroid removed   Muscle spasm    Neurological disorder    evaluation for ms   Painful menstrual periods  Shortness of breath    when i smoke a lot    Past Surgical History:  Procedure Laterality Date   BREAST BIOPSY Left    neg   carpel tunnel Left 2017   CESAREAN SECTION     times 2   CHOLECYSTECTOMY     THYROID LOBECTOMY      Family Psychiatric History: I have reviewed family psychiatric history from progress note on 03/24/2018.  Family History:  Family History  Problem Relation Age of Onset   Osteoporosis Mother    Breast cancer Mother 74   Diabetes Father    Anxiety disorder Father    Depression Father    Post-traumatic stress disorder Father    Heart disease Father    Lung cancer Father    Anxiety disorder  Sister    Alcohol abuse Brother    Colon cancer Neg Hx    Ovarian cancer Neg Hx     Social History: I have reviewed social history from progress note on 03/24/2018. Social History   Socioeconomic History   Marital status: Legally Separated    Spouse name: Not on file   Number of children: 2   Years of education: Not on file   Highest education level: High school graduate  Occupational History   Not on file  Tobacco Use   Smoking status: Every Day    Types: Cigarettes    Start date: 05/22/1986   Smokeless tobacco: Never   Tobacco comments:    Currently 1-2 cig per day - cut back- 03/25/23  Vaping Use   Vaping status: Never Used  Substance and Sexual Activity   Alcohol use: No    Alcohol/week: 0.0 standard drinks of alcohol   Drug use: No   Sexual activity: Yes    Partners: Male    Birth control/protection: Surgical, Injection  Other Topics Concern   Not on file  Social History Narrative   Not on file   Social Determinants of Health   Financial Resource Strain: Low Risk  (11/18/2022)   Overall Financial Resource Strain (CARDIA)    Difficulty of Paying Living Expenses: Not hard at all  Food Insecurity: No Food Insecurity (10/14/2023)   Hunger Vital Sign    Worried About Running Out of Food in the Last Year: Never true    Ran Out of Food in the Last Year: Never true  Transportation Needs: No Transportation Needs (10/14/2023)   PRAPARE - Administrator, Civil Service (Medical): No    Lack of Transportation (Non-Medical): No  Physical Activity: Insufficiently Active (11/18/2022)   Exercise Vital Sign    Days of Exercise per Week: 2 days    Minutes of Exercise per Session: 20 min  Stress: No Stress Concern Present (11/18/2022)   [employer] of Occupational Health - Occupational Stress Questionnaire    Feeling of Stress : Only a little  Social Connections: Socially Isolated (11/18/2022)   Social Connection and Isolation Panel [NHANES]    Frequency of  Communication with Friends and Family: More than three times a week    Frequency of Social Gatherings with Friends and Family: Never    Attends Religious Services: Never    Database administrator or Organizations: No    Attends Banker Meetings: Never    Marital Status: Separated    Allergies:  Allergies  Allergen Reactions   Azithromycin Other (See Comments)    headache   Codeine Other (See Comments)    unknown  Penicillins Other (See Comments)    headaches    Metabolic Disorder Labs: Lab Results  Component Value Date   HGBA1C 5.0 08/02/2022   Lab Results  Component Value Date   PROLACTIN 4.3 03/29/2022   Lab Results  Component Value Date   CHOL 155 03/29/2022   TRIG 419 (H) 03/29/2022   HDL 26 (L) 03/29/2022   CHOLHDL 6.0 (H) 03/29/2022   LDLCALC  03/29/2022     Comment:     . LDL cholesterol not calculated. Triglyceride levels greater than 400 mg/dL invalidate calculated LDL results. . Reference range: <100 . Desirable range <100 mg/dL for primary prevention;   <70 mg/dL for patients with CHD or diabetic patients  with > or = 2 CHD risk factors. Marland Kitchen LDL-C is now calculated using the Martin-Hopkins  calculation, which is a validated novel method providing  better accuracy than the Friedewald equation in the  estimation of LDL-C.  Horald Pollen et al. Lenox Ahr. 3244;010(27): 2061-2068  (http://education.[facility].com/faq/FAQ164)    Lab Results  Component Value Date   TSH 1.438 09/06/2021    Therapeutic Level Labs: No results found for: "LITHIUM" No results found for: "VALPROATE" No results found for: "CBMZ"  Current Medications: Current Outpatient Medications  Medication Sig Dispense Refill   albuterol (PROVENTIL) (2.5 MG/3ML) 0.083% nebulizer solution INHALE 3 ML BY NEBULIZATION EVERY 6 HOURS AS NEEDED FOR WHEEZING OR SHORTNESS OF BREATH 150 mL 1   albuterol (VENTOLIN HFA) 108 (90 Base) MCG/ACT inhaler INHALE 1 TO 2 PUFFS BY MOUTH DAILY  6.7 each 5   B-D 3CC LUER-LOK SYR 18GX1-1/2 18G X 1-1/2" 3 ML MISC USE AS DIRECTED TO DRAW UP B12     B-D DISP NEEDLE 25GX1" 25G X 1" MISC USE AS DIRECTED TO INJECT B12 INTO MUSCLE. USE FOR INJECT AFTER DRAW UP WITH 18 GAUGE NEEDLE.     BREO ELLIPTA 100-25 MCG/ACT AEPB TAKE 1 PUFF BY MOUTH EVERY DAY 60 each 4   brexpiprazole (REXULTI) 0.25 MG TABS tablet Take 1 tablet (0.25 mg total) by mouth daily. 30 tablet 1   buPROPion ER (WELLBUTRIN SR) 100 MG 12 hr tablet Take 1 tablet (100 mg total) by mouth daily with breakfast. 90 tablet 1   cyanocobalamin (VITAMIN B12) 1000 MCG/ML injection Inject 1,000 mcg into the skin once a week.     cyclobenzaprine (FLEXERIL) 10 MG tablet Take 10 mg by mouth 3 (three) times daily as needed for muscle spasms.     DULoxetine (CYMBALTA) 20 MG capsule Take 1 capsule (20 mg total) by mouth daily. Take along with 60 mg daily , total of 80 mg 90 capsule 1   DULoxetine (CYMBALTA) 60 MG capsule TAKE 1 CAPSULE BY MOUTH EVERY DAY, take along with 20 mg daily 90 capsule 1   gabapentin (NEURONTIN) 300 MG capsule Take one pill (300mg ) every morning and 2 pills (600mg ) in the evening     meloxicam (MOBIC) 7.5 MG tablet TAKE 1 TABLET BY MOUTH EVERY DAY 30 tablet 3   omeprazole (PRILOSEC) 40 MG capsule Take 1 capsule (40 mg total) by mouth daily. 90 capsule 3   propranolol ER (INDERAL LA) 60 MG 24 hr capsule Take 60 mg by mouth daily.     rosuvastatin (CRESTOR) 20 MG tablet TAKE 1 TABLET BY MOUTH EVERY DAY 90 tablet 3   Semaglutide, 2 MG/DOSE, (OZEMPIC, 2 MG/DOSE,) 8 MG/3ML SOPN INJECT 2MG  UNDER THE SKIN ONCE WEEKLY 1 mL 2   topiramate (TOPAMAX) 100 MG tablet Take 100 mg  by mouth daily.     Ubrogepant 100 MG TABS Take by mouth. (Patient not taking: Reported on 10/16/2023)     varenicline (CHANTIX) 1 MG tablet Take 1 tablet (1 mg total) by mouth 2 (two) times daily. 60 tablet 2   No current facility-administered medications for this visit.     Musculoskeletal: Strength & Muscle  Tone:  UTA Gait & Station:  Seated Patient leans: N/A  Psychiatric Specialty Exam: Review of Systems  Psychiatric/Behavioral:  Positive for dysphoric mood and sleep disturbance. The patient is nervous/anxious.     There were no vitals taken for this visit.There is no height or weight on file to calculate BMI.  General Appearance: Casual  Eye Contact:  Fair  Speech:  Clear and Coherent  Volume:  Normal  Mood:  Anxious and Depressed improving  Affect:  Congruent  Thought Process:  Goal Directed and Descriptions of Associations: Intact  Orientation:  Full (Time, Place, and Person)  Thought Content: Logical   Suicidal Thoughts:  No  Homicidal Thoughts:  No  Memory:  Immediate;   Fair Recent;   Fair Remote;   Fair  Judgement:  Fair  Insight:  Fair  Psychomotor Activity:  Normal  Concentration:  Concentration: Fair and Attention Span: Fair  Recall:  [employer] of Knowledge: Fair  Language: Fair  Akathisia:  No  Handed:  Right  AIMS (if indicated): not done  Assets:  Communication Skills Desire for Improvement Housing Social Support  ADL's:  Intact  Cognition: WNL  Sleep:   improving   Screenings: AIMS    Flowsheet Row Video Visit from 08/22/2022 in [facility] Video Visit from 05/22/2022 in [facility]  AIMS Total Score 0 0      AUDIT    Flowsheet Row Office Visit from 05/22/2016 in [facility]  Alcohol Use Disorder Identification Test Final Score (AUDIT) 0      GAD-7    Flowsheet Row Office Visit from 10/16/2023 in [facility] Video Visit from 08/22/2022 in [facility] Counselor from 03/05/2022 in [facility] Counselor from 10/06/2020 in [facility] Office Visit from 08/10/2019 in [facility]  Total GAD-7 Score 16 11 20 15 14       Mini-Mental    Flowsheet Row Clinical Support from 09/27/2021 in [facility]  Total Score (max 30 points ) 29      PHQ2-9    Flowsheet Row Office Visit from 10/16/2023 in [facility] Office Visit from 10/14/2023 in [facility] Video Visit from 12/24/2022 in [facility] Clinical Support from 11/18/2022 in [facility] Office Visit from 11/01/2022 in [facility]  PHQ-2 Total Score 5 2 2 3  0  PHQ-9 Total Score 19 -- 9 3 6       Flowsheet Row Video Visit from 11/17/2023 in [facility] Office Visit from 10/16/2023 in [facility] Counselor from 07/15/2023 in [facility] RISK CATEGORY Moderate Risk No Risk No Risk        Assessment and Plan: Mary Koch is a 52 year old Caucasian female who has a history of  MDD, panic attacks, tobacco use disorder, caffeine use disorder, insomnia was evaluated by telemedicine today.  Patient with improvement in her mood symptoms, on the addition of Rexulti, will benefit from repeating labs since she is on an atypical antipsychotic as noted below.  Plan MDD-improving Cymbalta 80 mg p.o. daily Wellbutrin SR 100 mg p.o. daily in the morning Rexulti 0.25 mg p.o. daily  Panic disorder/agoraphobia-improving Cymbalta 80 mg p.o. daily Continue gabapentin as prescribed by neurology Patient was advised to establish care with therapist-pending  Insomnia-improving Patient to work on sleep hygiene. We will consider adding a sleep medication in the future.  Tobacco use disorder-unstable Chantix 1 mg p.o. twice daily Provided counseling for 1 minute.  Caffeine use  disorder-improving Will monitor closely  High risk medication use-patient will benefit from labs repeat-hemoglobin A1c, prolactin level, TSH.  Patient prefers to get it done at primary care provider's office.  Will consider ordering next visit if she is unable to do it at her primary care.  Follow-up in clinic in 2 months or sooner in person. Consent: Patient/Guardian gives verbal consent for treatment and assignment of benefits for services provided during this visit. Patient/Guardian expressed understanding and agreed to proceed.   This note was generated in part or whole with voice recognition software. Voice recognition is usually quite accurate but there are transcription errors that can and very often do occur. I apologize for any typographical errors that were not detected and corrected.    Jomarie Longs, MD 11/18/2023, 2:34 PM

## 2023-12-15 DIAGNOSIS — R7303 Prediabetes: Secondary | ICD-10-CM | POA: Diagnosis not present

## 2023-12-15 DIAGNOSIS — F419 Anxiety disorder, unspecified: Secondary | ICD-10-CM | POA: Diagnosis not present

## 2023-12-15 DIAGNOSIS — N946 Dysmenorrhea, unspecified: Secondary | ICD-10-CM | POA: Diagnosis not present

## 2023-12-15 DIAGNOSIS — K219 Gastro-esophageal reflux disease without esophagitis: Secondary | ICD-10-CM | POA: Diagnosis not present

## 2023-12-15 DIAGNOSIS — I10 Essential (primary) hypertension: Secondary | ICD-10-CM | POA: Diagnosis not present

## 2023-12-15 DIAGNOSIS — F410 Panic disorder [episodic paroxysmal anxiety] without agoraphobia: Secondary | ICD-10-CM | POA: Diagnosis not present

## 2023-12-22 ENCOUNTER — Ambulatory Visit (INDEPENDENT_AMBULATORY_CARE_PROVIDER_SITE_OTHER): Payer: Medicare HMO

## 2023-12-22 VITALS — BP 120/66 | HR 86 | Ht 66.0 in | Wt 240.0 lb

## 2023-12-22 DIAGNOSIS — Z3042 Encounter for surveillance of injectable contraceptive: Secondary | ICD-10-CM | POA: Diagnosis not present

## 2023-12-22 MED ORDER — MEDROXYPROGESTERONE ACETATE 150 MG/ML IM SUSY
150.0000 mg | PREFILLED_SYRINGE | Freq: Once | INTRAMUSCULAR | Status: AC
Start: 2023-12-22 — End: 2023-12-22
  Administered 2023-12-22: 150 mg via INTRAMUSCULAR

## 2023-12-22 NOTE — Progress Notes (Signed)
    NURSE VISIT NOTE  Subjective:    Patient ID: Mary Koch, female    DOB: 1971-09-01, 52 y.o.   MRN: [id number]  HPI  Patient is a 52 y.o. G52P2002 female who presents for depo provera injection.   Objective:    BP 120/66   Pulse 86   Ht 5\' 6"  (1.676 m)   Wt 240 lb (108.9 kg)   BMI 38.74 kg/m   Last Annual: 08/05/23. Last pap: 06/06/22. Last Depo-Provera: 10/06/23. Side Effects if any: none. Serum HCG indicated? No . Depo-Provera 150 mg IM given by: Donnetta Hail, CMA. Site: Right Deltoid   Assessment:   1. Encounter for surveillance of injectable contraceptive      Plan:   Next appointment due between March 10 and March 24.    Donnetta Hail, CMA

## 2023-12-22 NOTE — Patient Instructions (Signed)

## 2024-01-06 ENCOUNTER — Telehealth: Payer: Self-pay

## 2024-01-06 NOTE — Telephone Encounter (Signed)
 Received a fax for a prior authorization for the patients Rexulti  0.25 mg tablet after initiating via CoverMyMeds message received stating  Information regarding your request Authorization already on file for this request. Authorization starting on 09/30/2023 and ending on 12/29/2024.

## 2024-01-18 ENCOUNTER — Other Ambulatory Visit: Payer: Self-pay | Admitting: Psychiatry

## 2024-01-18 DIAGNOSIS — F17200 Nicotine dependence, unspecified, uncomplicated: Secondary | ICD-10-CM

## 2024-02-04 DIAGNOSIS — F410 Panic disorder [episodic paroxysmal anxiety] without agoraphobia: Secondary | ICD-10-CM | POA: Diagnosis not present

## 2024-02-04 DIAGNOSIS — E785 Hyperlipidemia, unspecified: Secondary | ICD-10-CM | POA: Diagnosis not present

## 2024-02-04 DIAGNOSIS — J45909 Unspecified asthma, uncomplicated: Secondary | ICD-10-CM | POA: Diagnosis not present

## 2024-02-04 DIAGNOSIS — R062 Wheezing: Secondary | ICD-10-CM | POA: Diagnosis not present

## 2024-02-04 DIAGNOSIS — R7303 Prediabetes: Secondary | ICD-10-CM | POA: Diagnosis not present

## 2024-02-04 DIAGNOSIS — K219 Gastro-esophageal reflux disease without esophagitis: Secondary | ICD-10-CM | POA: Diagnosis not present

## 2024-02-18 ENCOUNTER — Telehealth (INDEPENDENT_AMBULATORY_CARE_PROVIDER_SITE_OTHER): Payer: Self-pay | Admitting: Psychiatry

## 2024-02-18 DIAGNOSIS — F331 Major depressive disorder, recurrent, moderate: Secondary | ICD-10-CM

## 2024-02-18 NOTE — Progress Notes (Signed)
 No response to call or text or video invite

## 2024-03-16 ENCOUNTER — Ambulatory Visit: Payer: Medicare HMO

## 2024-03-19 ENCOUNTER — Ambulatory Visit

## 2024-03-22 ENCOUNTER — Ambulatory Visit (INDEPENDENT_AMBULATORY_CARE_PROVIDER_SITE_OTHER)

## 2024-03-22 VITALS — BP 142/68 | HR 83 | Wt 237.5 lb

## 2024-03-22 DIAGNOSIS — Z3042 Encounter for surveillance of injectable contraceptive: Secondary | ICD-10-CM | POA: Diagnosis not present

## 2024-03-22 MED ORDER — MEDROXYPROGESTERONE ACETATE 150 MG/ML IM SUSY
150.0000 mg | PREFILLED_SYRINGE | Freq: Once | INTRAMUSCULAR | Status: AC
Start: 2024-03-22 — End: 2024-03-22
  Administered 2024-03-22: 150 mg via INTRAMUSCULAR

## 2024-03-22 NOTE — Progress Notes (Signed)
    NURSE VISIT NOTE  Subjective:    Patient ID: Natane Heward, female    DOB: [birth year], 53 y.o.   MRN: [id number]  HPI  Patient is a 53 y.o. G70P2002 female who presents for depo provera injection.   Objective:    BP (!) 142/68 (BP Location: Left Arm, Patient Position: Sitting, Cuff Size: Large)   Pulse 83   Wt 237 lb 8 oz (107.7 kg)   BMI 38.33 kg/m   Last Annual: 08/05/2023. Last pap: 06/06/2022. Last Depo-Provera: 12/22/2023. Side Effects if any: none. Serum HCG indicated? No . Depo-Provera 150 mg IM given by: Doristine Devoid, CMA. Site: Right Deltoid   Assessment:   No diagnosis found.   Plan:   Next appointment due between June 9th and June 23rd.    Burtis Junes, CMA

## 2024-04-01 ENCOUNTER — Encounter: Payer: Self-pay | Admitting: Psychiatry

## 2024-04-01 ENCOUNTER — Telehealth: Admitting: Psychiatry

## 2024-04-01 DIAGNOSIS — F4000 Agoraphobia, unspecified: Secondary | ICD-10-CM

## 2024-04-01 DIAGNOSIS — F1590 Other stimulant use, unspecified, uncomplicated: Secondary | ICD-10-CM

## 2024-04-01 DIAGNOSIS — F17200 Nicotine dependence, unspecified, uncomplicated: Secondary | ICD-10-CM | POA: Diagnosis not present

## 2024-04-01 DIAGNOSIS — F410 Panic disorder [episodic paroxysmal anxiety] without agoraphobia: Secondary | ICD-10-CM | POA: Diagnosis not present

## 2024-04-01 DIAGNOSIS — G4701 Insomnia due to medical condition: Secondary | ICD-10-CM

## 2024-04-01 DIAGNOSIS — Z79899 Other long term (current) drug therapy: Secondary | ICD-10-CM | POA: Diagnosis not present

## 2024-04-01 DIAGNOSIS — F331 Major depressive disorder, recurrent, moderate: Secondary | ICD-10-CM

## 2024-04-01 MED ORDER — DULOXETINE HCL 60 MG PO CPEP
ORAL_CAPSULE | ORAL | 1 refills | Status: DC
Start: 2024-04-01 — End: 2024-11-18

## 2024-04-01 MED ORDER — DULOXETINE HCL 20 MG PO CPEP
20.0000 mg | ORAL_CAPSULE | Freq: Every day | ORAL | 1 refills | Status: DC
Start: 2024-04-01 — End: 2024-11-18

## 2024-04-01 MED ORDER — BUPROPION HCL ER (SR) 100 MG PO TB12: 100.0000 mg | ORAL_TABLET | Freq: Every day | ORAL | 1 refills | Status: DC

## 2024-04-01 MED ORDER — BREXPIPRAZOLE 0.25 MG PO TABS
0.2500 mg | ORAL_TABLET | Freq: Every day | ORAL | 1 refills | Status: DC
Start: 2024-04-01 — End: 2024-06-04

## 2024-04-01 NOTE — Progress Notes (Unsigned)
 Virtual Visit via Video Note  I connected with Mary Koch on 04/01/24 at  2:00 PM EDT by a video enabled telemedicine application and verified that I am speaking with the correct person using two identifiers.  Location Provider Location : [facility] Patient Location : Home  Participants: Patient , Provider    I discussed the limitations of evaluation and management by telemedicine and the availability of in person appointments. The patient expressed understanding and agreed to proceed.    I discussed the assessment and treatment plan with the patient. The patient was provided an opportunity to ask questions and all were answered. The patient agreed with the plan and demonstrated an understanding of the instructions.   The patient was advised to call back or seek an in-person evaluation if the symptoms worsen or if the condition fails to improve as anticipated.   BH MD OP Progress Note  04/02/2024 1:05 PM Mary Koch  MRN:  [id number]  Chief Complaint:  Chief Complaint  Patient presents with   Follow-up   Depression   Anxiety   Medication Refill   HPI: Mary Koch is a 53 year old Caucasian female who lives in [place], has a history of MDD, panic disorder, agoraphobia, insomnia, tobacco use disorder, caffeine use disorder was evaluated by telemedicine today.  For the past month, she has experienced increased anxiety, particularly at night, characterized by a fear of not waking up if she falls asleep. This coincides with recent life stressors, including her brother being placed in hospice care and the loss of her car due to payment issues.  She also continues to have depression symptoms including low motivation, low energy, feeling down often.  Her current medications include Cymbalta, Wellbutrin, and Rexulti, although she has not taken Rexulti for a couple of months due to forgetfulness. She reported no side effects when she was taking Rexulti.  She has a history of caffeine  use disorder and tobacco use disorder. Recently, she has been smoking more due to stress, consuming about five cigarettes before bed, whereas she was previously down to half a pack a day. Regarding caffeine, she consumes about three [employer] a day, which she acknowledges is an improvement but still high.  Socially, she lives in a mobile home provided by her son's father, who lives nearby, and has access to her backup vehicle for transportation needs. She expresses some dissatisfaction with this arrangement but acknowledges it provides necessary support.  She currently denies any suicidality, homicidality or perceptual disturbances.  Visit Diagnosis:    ICD-10-CM   1. MDD (major depressive disorder), recurrent episode, moderate (HCC)  F33.1 brexpiprazole (REXULTI) 0.25 MG TABS tablet    buPROPion ER (WELLBUTRIN SR) 100 MG 12 hr tablet    DULoxetine (CYMBALTA) 20 MG capsule    2. Panic disorder  F41.0 Urine drugs of abuse scrn w alc, routine (Ref Lab)    DULoxetine (CYMBALTA) 60 MG capsule    3. Agoraphobia  F40.00     4. Insomnia due to medical condition  G47.01 Urine drugs of abuse scrn w alc, routine (Ref Lab)   Multifactorial including lack of sleep hygiene, depression, anxiety    5. Tobacco use disorder  F17.200 Urine drugs of abuse scrn w alc, routine (Ref Lab)    6. Caffeine use disorder  F15.90 Urine drugs of abuse scrn w alc, routine (Ref Lab)    7. High risk medication use  Z79.899 Urine drugs of abuse scrn w alc, routine (Ref Lab)  Past Psychiatric History: I have reviewed past psychiatric history from progress note on 03/24/2018.  Past trials of Paxil, Luvox, Klonopin, Zoloft, Seroquel, Wellbutrin, Ambien.  Past Medical History:  Past Medical History:  Diagnosis Date   Allergic rhinitis    Anemia    Anxiety    Anxiety and depression    Carpal tunnel syndrome    COVID-19 01/15/2020   Depression    Fibroids    GERD (gastroesophageal reflux disease)     Headache(784.0)    Headache(784.0)    Heavy periods    Hyperthyroidism    right portion of thyroid removed   Muscle spasm    Neurological disorder    evaluation for ms   Painful menstrual periods    Shortness of breath    when i smoke a lot    Past Surgical History:  Procedure Laterality Date   BREAST BIOPSY Left    neg   carpel tunnel Left 2017   CESAREAN SECTION     times 2   CHOLECYSTECTOMY     THYROID LOBECTOMY      Family Psychiatric History: I have reviewed family psychiatric history from progress note on 03/24/2018.  Family History:  Family History  Problem Relation Age of Onset   Osteoporosis Mother    Breast cancer Mother 18   Diabetes Father    Anxiety disorder Father    Depression Father    Post-traumatic stress disorder Father    Heart disease Father    Lung cancer Father    Anxiety disorder Sister    Alcohol abuse Brother    Colon cancer Neg Hx    Ovarian cancer Neg Hx     Social History: I have reviewed social history from progress note on 03/24/2018. Social History   Socioeconomic History   Marital status: Legally Separated    Spouse name: Not on file   Number of children: 2   Years of education: Not on file   Highest education level: High school graduate  Occupational History   Not on file  Tobacco Use   Smoking status: Every Day    Types: Cigarettes    Start date: 05/22/1986   Smokeless tobacco: Never   Tobacco comments:    Currently 1-2 cig per day - cut back- 03/25/23  Vaping Use   Vaping status: Never Used  Substance and Sexual Activity   Alcohol use: No    Alcohol/week: 0.0 standard drinks of alcohol   Drug use: No   Sexual activity: Yes    Partners: Male    Birth control/protection: Surgical, Injection  Other Topics Concern   Not on file  Social History Narrative   Not on file   Social Drivers of Health   Financial Resource Strain: Low Risk  (11/18/2022)   Overall Financial Resource Strain (CARDIA)    Difficulty of Paying  Living Expenses: Not hard at all  Food Insecurity: No Food Insecurity (10/14/2023)   Hunger Vital Sign    Worried About Running Out of Food in the Last Year: Never true    Ran Out of Food in the Last Year: Never true  Transportation Needs: No Transportation Needs (10/14/2023)   PRAPARE - Administrator, Civil Service (Medical): No    Lack of Transportation (Non-Medical): No  Physical Activity: Insufficiently Active (11/18/2022)   Exercise Vital Sign    Days of Exercise per Week: 2 days    Minutes of Exercise per Session: 20 min  Stress: No Stress Concern Present (  11/18/2022)   [place] [employer] - Occupational Stress Questionnaire    Feeling of Stress : Only a little  Social Connections: Socially Isolated (11/18/2022)   Social Connection and Isolation Panel [NHANES]    Frequency of Communication with Friends and Family: More than three times a week    Frequency of Social Gatherings with Friends and Family: Never    Attends Religious Services: Never    Database administrator or Organizations: No    Attends Banker Meetings: Never    Marital Status: Separated    Allergies:  Allergies  Allergen Reactions   Azithromycin Other (See Comments)    headache   Codeine Other (See Comments)    unknown   Penicillins Other (See Comments)    headaches    Metabolic Disorder Labs: Lab Results  Component Value Date   HGBA1C 5.0 08/02/2022   Lab Results  Component Value Date   PROLACTIN 4.3 03/29/2022   Lab Results  Component Value Date   CHOL 155 03/29/2022   TRIG 419 (H) 03/29/2022   HDL 26 (L) 03/29/2022   CHOLHDL 6.0 (H) 03/29/2022   LDLCALC  03/29/2022     Comment:     . LDL cholesterol not calculated. Triglyceride levels greater than 400 mg/dL invalidate calculated LDL results. . Reference range: <100 . Desirable range <100 mg/dL for primary prevention;   <70 mg/dL for patients with CHD or diabetic patients  with > or = 2  CHD risk factors. Marland Kitchen LDL-C is now calculated using the Martin-Hopkins  calculation, which is a validated novel method providing  better accuracy than the Friedewald equation in the  estimation of LDL-C.  Horald Pollen et al. Lenox Ahr. 1610;960(45): 2061-2068  (http://education.[employer].com/faq/FAQ164)    Lab Results  Component Value Date   TSH 1.438 09/06/2021    Therapeutic Level Labs: No results found for: "LITHIUM" No results found for: "VALPROATE" No results found for: "CBMZ"  Current Medications: Current Outpatient Medications  Medication Sig Dispense Refill   albuterol (PROVENTIL) (2.5 MG/3ML) 0.083% nebulizer solution INHALE 3 ML BY NEBULIZATION EVERY 6 HOURS AS NEEDED FOR WHEEZING OR SHORTNESS OF BREATH 150 mL 1   albuterol (VENTOLIN HFA) 108 (90 Base) MCG/ACT inhaler INHALE 1 TO 2 PUFFS BY MOUTH DAILY 6.7 each 5   BREO ELLIPTA 100-25 MCG/ACT AEPB TAKE 1 PUFF BY MOUTH EVERY DAY 60 each 4   brexpiprazole (REXULTI) 0.25 MG TABS tablet Take 1 tablet (0.25 mg total) by mouth daily. 30 tablet 1   buPROPion ER (WELLBUTRIN SR) 100 MG 12 hr tablet Take 1 tablet (100 mg total) by mouth daily with breakfast. 90 tablet 1   cyclobenzaprine (FLEXERIL) 10 MG tablet Take 10 mg by mouth 3 (three) times daily as needed for muscle spasms.     DULoxetine (CYMBALTA) 20 MG capsule Take 1 capsule (20 mg total) by mouth daily. Take along with 60 mg daily , total of 80 mg 90 capsule 1   DULoxetine (CYMBALTA) 60 MG capsule TAKE 1 CAPSULE BY MOUTH EVERY DAY, take along with 20 mg daily 90 capsule 1   gabapentin (NEURONTIN) 300 MG capsule Take one pill (300mg ) every morning and 2 pills (600mg ) in the evening     meloxicam (MOBIC) 7.5 MG tablet TAKE 1 TABLET BY MOUTH EVERY DAY 30 tablet 3   omeprazole (PRILOSEC) 40 MG capsule Take 1 capsule (40 mg total) by mouth daily. 90 capsule 3   propranolol ER (INDERAL LA) 60 MG 24 hr capsule  Take 60 mg by mouth daily.     rosuvastatin (CRESTOR) 20 MG tablet TAKE  1 TABLET BY MOUTH EVERY DAY 90 tablet 3   topiramate (TOPAMAX) 100 MG tablet Take 100 mg by mouth daily.     Ubrogepant 100 MG TABS Take by mouth.     varenicline (CHANTIX) 1 MG tablet TAKE 1 TABLET BY MOUTH TWICE A DAY 180 tablet 0   No current facility-administered medications for this visit.     Musculoskeletal: Strength & Muscle Tone:  UTA Gait & Station:  Seated Patient leans: N/A  Psychiatric Specialty Exam: Review of Systems  Psychiatric/Behavioral:  Positive for decreased concentration, dysphoric mood and sleep disturbance. The patient is nervous/anxious.     There were no vitals taken for this visit.There is no height or weight on file to calculate BMI.  General Appearance: Casual  Eye Contact:  Good  Speech:  Clear and Coherent  Volume:  Normal  Mood:  Anxious and Depressed  Affect:  Congruent  Thought Process:  Goal Directed and Descriptions of Associations: Intact  Orientation:  Full (Time, Place, and Person)  Thought Content: Logical   Suicidal Thoughts:  No  Homicidal Thoughts:  No  Memory:  Immediate;   Fair Recent;   Fair Remote;   Fair  Judgement:  Fair  Insight:  Fair  Psychomotor Activity:  Normal  Concentration:  Concentration: Fair and Attention Span: Fair  Recall:  [employer] of Knowledge: Fair  Language: Fair  Akathisia:  No  Handed:  Right  AIMS (if indicated): not done  Assets:  Communication Skills Desire for Improvement Housing Social Support  ADL's:  Intact  Cognition: WNL  Sleep:  Poor   Screenings: AIMS    Flowsheet Row Video Visit from 08/22/2022 in [facility] Video Visit from 05/22/2022 in [facility]  AIMS Total Score 0 0      AUDIT    Flowsheet Row Office Visit from 05/22/2016 in [facility]  Alcohol Use Disorder Identification Test Final Score (AUDIT) 0      GAD-7    Flowsheet Row Office Visit  from 10/16/2023 in [facility] Video Visit from 08/22/2022 in [facility] Counselor from 03/05/2022 in [facility] Counselor from 10/06/2020 in [facility] Office Visit from 08/10/2019 in [facility]  Total GAD-7 Score 16 11 20 15 14       Mini-Mental    Flowsheet Row Clinical Support from 09/27/2021 in [facility]  Total Score (max 30 points ) 29      PHQ2-9    Flowsheet Row Office Visit from 10/16/2023 in [facility] Office Visit from 10/14/2023 in [facility] Video Visit from 12/24/2022 in [facility] Clinical Support from 11/18/2022 in [facility] Office Visit from 11/01/2022 in [facility]  PHQ-2 Total Score 5 2 2 3  0  PHQ-9 Total Score 19 -- 9 3 6       Flowsheet Row Video Visit from 04/01/2024 in [facility] Video Visit from 11/17/2023 in [facility] Office Visit from 10/16/2023 in [facility]  C-SSRS RISK CATEGORY Moderate Risk  Moderate Risk No Risk        Assessment and Plan: Twania Bujak is a 53 year old Caucasian female who has a history of MDD, panic attacks, tobacco use disorder, caffeine use disorder, insomnia was evaluated by telemedicine today.  Discussed assessment and plan as noted below.  Panic disorder/Agoraphobia-unstable Increased anxiety at night, fearing she won't wake up if she falls asleep, related to recent life stressors including brother's hospice care and loss of car. No new medications started recently. - Order urine drug screen at [facility]  lab. - Prescribe Conazepam 0.5 mg, instruct to take half to one tablet up to three to four times a week initially, then reduce to two to three times a week. - Advise against daily use of Clonazepam due to risk of habit formation. - Continue Cymbalta 80 mg daily  Insomnia-unstable Difficulty sleeping due to anxiety about not waking up, developed over the past month. Clonazepam may aid in sleep by addressing anxiety. - Address anxiety as a contributing factor to insomnia. - Prescribe Clonazepam as needed for anxiety, which may also aid in sleep. - Continue sleep hygiene techniques  Major Depressive Disorder-unstable Currently on Cymbalta, Wellbutrin, and Rexulti. She has not taken Rexulti for a couple of months due to forgetfulness, but reported no side effects when taking it. Resumption of Rexulti is encouraged for managing depression. - Continue Cymbalta 80 mg daily - Continue Wellbutrin SR 100 mg daily - Restart Rexulti 0.25 mg daily, patient noncompliant - Restart psychotherapy session, patient currently scheduled to start psychotherapy with Ms. Janina Mayo.  Tobacco Use Disorder-unstable Increased smoking due to recent stressors, currently more than half a pack a day. Previously reduced to half a pack a day. - Encourage reduction in smoking. - Discuss potential use of Chantix if needed.  Caffeine Use Disorder-unstable Consumes about three [employer] a day, reduced but still high. Caffeine intake may affect mood and sleep. - Encourage further reduction in caffeine intake. - Advise monitoring caffeine consumption and its effects on mood and sleep.  High risk medication use-will order urine drug screen.  Patient to go to [facility] lab.  Once lab results reviewed we will consider sending Clonazepam short supply to pharmacy as discussed.  Follow-up Next appointment scheduled for May 9th at 10 AM by video. Importance of keeping therapist appointment in May emphasized due to limited  availability. - Schedule follow-up appointment on May 9th at 10 AM by video. - Ensure she keeps therapist appointment in May. Marland Kitchen  Collaboration of Care: Collaboration of Care: Referral or follow-up with counselor/therapist AEB patient encouraged to establish care with therapist.  Patient/Guardian was advised Release of Information must be obtained prior to any record release in order to collaborate their care with an outside provider. Patient/Guardian was advised if they have not already done so to contact the registration department to sign all necessary forms in order for [place] to release information regarding their care.   Consent: Patient/Guardian gives verbal consent for treatment and assignment of benefits for services provided during this visit. Patient/Guardian expressed understanding and agreed to proceed.  Discussed the use of a AI scribe software for clinical note transcription with the patient, who gave verbal consent to proceed.  This note was generated in part or whole with voice recognition software. Voice recognition is usually quite accurate but there are transcription errors that can and very often do occur. I apologize for any typographical errors that were not detected and corrected.     Mary Giovanetti,  MD 04/02/2024, 1:05 PM

## 2024-04-02 ENCOUNTER — Other Ambulatory Visit
Admission: RE | Admit: 2024-04-02 | Discharge: 2024-04-02 | Disposition: A | Discharge status: Home / Self Care | Attending: Psychiatry | Admitting: Psychiatry

## 2024-04-02 DIAGNOSIS — F410 Panic disorder [episodic paroxysmal anxiety] without agoraphobia: Secondary | ICD-10-CM | POA: Insufficient documentation

## 2024-04-02 DIAGNOSIS — Z79899 Other long term (current) drug therapy: Secondary | ICD-10-CM | POA: Insufficient documentation

## 2024-04-02 DIAGNOSIS — F1590 Other stimulant use, unspecified, uncomplicated: Secondary | ICD-10-CM | POA: Diagnosis not present

## 2024-04-02 DIAGNOSIS — F17200 Nicotine dependence, unspecified, uncomplicated: Secondary | ICD-10-CM | POA: Insufficient documentation

## 2024-04-02 DIAGNOSIS — G4701 Insomnia due to medical condition: Secondary | ICD-10-CM | POA: Insufficient documentation

## 2024-04-05 ENCOUNTER — Telehealth: Payer: Self-pay | Admitting: Psychiatry

## 2024-04-05 DIAGNOSIS — F410 Panic disorder [episodic paroxysmal anxiety] without agoraphobia: Secondary | ICD-10-CM

## 2024-04-05 LAB — URINE DRUGS OF ABUSE SCREEN W ALC, ROUTINE (REF LAB)
Amphetamines, Urine: NEGATIVE ng/mL
Barbiturate, Ur: NEGATIVE ng/mL
Benzodiazepine Quant, Ur: NEGATIVE ng/mL
Cannabinoid Quant, Ur: NEGATIVE ng/mL
Cocaine (Metab.): NEGATIVE ng/mL
Ethanol U, Quan: NEGATIVE %
Methadone Screen, Urine: NEGATIVE ng/mL
Phencyclidine, Ur: NEGATIVE ng/mL
Propoxyphene, Urine: NEGATIVE ng/mL

## 2024-04-05 MED ORDER — CLONAZEPAM 0.5 MG PO TABS
0.2500 mg | ORAL_TABLET | Freq: Every day | ORAL | 0 refills | Status: AC | PRN
Start: 2024-04-05 — End: 2024-05-05

## 2024-04-05 NOTE — Telephone Encounter (Signed)
 I have sent clonazepam to pharmacy as needed.  Urine drug screen reviewed and normal.

## 2024-04-06 NOTE — Telephone Encounter (Signed)
 Pt.notified

## 2024-05-07 ENCOUNTER — Telehealth (INDEPENDENT_AMBULATORY_CARE_PROVIDER_SITE_OTHER): Admitting: Psychiatry

## 2024-05-07 ENCOUNTER — Encounter: Payer: Self-pay | Admitting: Psychiatry

## 2024-05-07 DIAGNOSIS — F1590 Other stimulant use, unspecified, uncomplicated: Secondary | ICD-10-CM

## 2024-05-07 DIAGNOSIS — F17200 Nicotine dependence, unspecified, uncomplicated: Secondary | ICD-10-CM

## 2024-05-07 DIAGNOSIS — G4701 Insomnia due to medical condition: Secondary | ICD-10-CM | POA: Diagnosis not present

## 2024-05-07 DIAGNOSIS — F4000 Agoraphobia, unspecified: Secondary | ICD-10-CM

## 2024-05-07 DIAGNOSIS — F331 Major depressive disorder, recurrent, moderate: Secondary | ICD-10-CM | POA: Diagnosis not present

## 2024-05-07 DIAGNOSIS — F410 Panic disorder [episodic paroxysmal anxiety] without agoraphobia: Secondary | ICD-10-CM

## 2024-05-07 MED ORDER — TRAZODONE HCL 50 MG PO TABS
25.0000 mg | ORAL_TABLET | Freq: Every evening | ORAL | 1 refills | Status: DC | PRN
Start: 2024-05-07 — End: 2024-06-04

## 2024-05-07 NOTE — Progress Notes (Signed)
 Virtual Visit via Video Note  I connected with Mary Koch on 05/07/24 at 10:00 AM EDT by a video enabled telemedicine application and verified that I am speaking with the correct person using two identifiers.  Location Provider Location : [facility] Patient Location : Home  Participants: Patient , Provider    I discussed the limitations of evaluation and management by telemedicine and the availability of in person appointments. The patient expressed understanding and agreed to proceed.   I discussed the assessment and treatment plan with the patient. The patient was provided an opportunity to ask questions and all were answered. The patient agreed with the plan and demonstrated an understanding of the instructions.   The patient was advised to call back or seek an in-person evaluation if the symptoms worsen or if the condition fails to improve as anticipated.  [facility] MD OP Progress Note  05/07/2024 1:11 PM Timya Aylissa Lohn  MRN:  [id number]  Chief Complaint:  Chief Complaint  Patient presents with   Depression   Anxiety   Medication Refill   Follow-up   Insomnia   Discussed the use of AI scribe software for clinical note transcription with the patient, who gave verbal consent to proceed.  History of Present Illness Mary Koch is a 53 year old Caucasian female who lives in [place], has a history of MDD, panic disorder, agoraphobia, insomnia, tobacco use disorder, caffeine use disorder was evaluated by telemedicine today.  She has been experiencing sleep disturbances for about a month, characterized by waking up around 3:25 AM and having difficulty returning to sleep until around 6:00 AM. This issue persists despite her current medication regimen, which includes Rexulti , Cymbalta , Wellbutrin , and Chantix .  She was recently restarted on Rexulti . While she is doing well with the medication, she is not sleeping through the night. She has been prescribed clonazepam  for emergencies and has  used it during her brother's passing and funeral, as well as on a morning when she woke up and felt she couldn't breathe.  She has a history of snoring and underwent a sleep apnea test over 14 years ago, which showed no issues at that time.  She may have tried trazodone  previously however does not remember how she tolerated it.  She is agreeable to trial.  She smokes about half a pack of cigarettes daily and is actively trying to cut back.  She does have Chantix  available.  She is also reducing caffeine intake by avoiding caffeinated drinks and opting for water with lemonade mixes.  Her brother recently passed away on 25-May-2025and she is coping with the loss. She has an upcoming appointment with a therapist on May 13th. Her mother and niece are also involved in accessing grief counseling services.  She currently denies any suicidality, homicidality or perceptual disturbances.    Visit Diagnosis:    ICD-10-CM   1. MDD (major depressive disorder), recurrent episode, moderate (HCC)  F33.1 traZODone  (DESYREL ) 50 MG tablet    2. Panic disorder  F41.0     3. Agoraphobia  F40.00     4. Insomnia due to medical condition  G47.01 traZODone  (DESYREL ) 50 MG tablet   Multiple including sleep hygiene techniques mood symptoms, grief,, rule out sleep apnea    5. Tobacco use disorder  F17.200     6. Caffeine use disorder  F15.90       Past Psychiatric History: I have reviewed past psychiatric history from progress note on 03/24/2018.  Past trials of Paxil,  Luvox, Klonopin , Zoloft, Seroquel , Wellbutrin , Ambien .  Past Medical History:  Past Medical History:  Diagnosis Date   Allergic rhinitis    Anemia    Anxiety    Anxiety and depression    Carpal tunnel syndrome    COVID-19 01/15/2020   Depression    Fibroids    GERD (gastroesophageal reflux disease)    Headache(784.0)    Headache(784.0)    Heavy periods    Hyperthyroidism    right portion of thyroid  removed   Muscle spasm     Neurological disorder    evaluation for ms   Painful menstrual periods    Shortness of breath    when i smoke a lot    Past Surgical History:  Procedure Laterality Date   BREAST BIOPSY Left    neg   carpel tunnel Left 2017   CESAREAN SECTION     times 2   CHOLECYSTECTOMY     THYROID  LOBECTOMY      Family Psychiatric History: I have reviewed family psychiatric history from progress note on 03/24/2018.  Family History:  Family History  Problem Relation Age of Onset   Osteoporosis Mother    Breast cancer Mother 31   Diabetes Father    Anxiety disorder Father    Depression Father    Post-traumatic stress disorder Father    Heart disease Father    Lung cancer Father    Anxiety disorder Sister    Alcohol abuse Brother    Colon cancer Neg Hx    Ovarian cancer Neg Hx     Social History: I have reviewed social history from progress note on 03/24/2018. Social History   Socioeconomic History   Marital status: Legally Separated    Spouse name: Not on file   Number of children: 2   Years of education: Not on file   Highest education level: High school graduate  Occupational History   Not on file  Tobacco Use   Smoking status: Every Day    Types: Cigarettes    Start date: 05/22/1986   Smokeless tobacco: Never   Tobacco comments:    Currently 1-2 cig per day - cut back- 03/25/23  Vaping Use   Vaping status: Never Used  Substance and Sexual Activity   Alcohol use: No    Alcohol/week: 0.0 standard drinks of alcohol   Drug use: No   Sexual activity: Yes    Partners: Male    Birth control/protection: Surgical, Injection  Other Topics Concern   Not on file  Social History Narrative   Not on file   Social Drivers of Health   Financial Resource Strain: Low Risk  (11/18/2022)   Overall Financial Resource Strain (CARDIA)    Difficulty of Paying Living Expenses: Not hard at all  Food Insecurity: No Food Insecurity (10/14/2023)   Hunger Vital Sign    Worried About Running  Out of Food in the Last Year: Never true    Ran Out of Food in the Last Year: Never true  Transportation Needs: No Transportation Needs (10/14/2023)   PRAPARE - Administrator, Civil Service (Medical): No    Lack of Transportation (Non-Medical): No  Physical Activity: Insufficiently Active (11/18/2022)   Exercise Vital Sign    Days of Exercise per Week: 2 days    Minutes of Exercise per Session: 20 min  Stress: No Stress Concern Present (11/18/2022)   [employer] of Occupational Health - Occupational Stress Questionnaire    Feeling of Stress :  Only a little  Social Connections: Socially Isolated (11/18/2022)   Social Connection and Isolation Panel [NHANES]    Frequency of Communication with Friends and Family: More than three times a week    Frequency of Social Gatherings with Friends and Family: Never    Attends Religious Services: Never    Database administrator or Organizations: No    Attends Banker Meetings: Never    Marital Status: Separated    Allergies:  Allergies  Allergen Reactions   Azithromycin Other (See Comments)    headache   Codeine Other (See Comments)    unknown   Penicillins Other (See Comments)    headaches    Metabolic Disorder Labs: Lab Results  Component Value Date   HGBA1C 5.0 08/02/2022   Lab Results  Component Value Date   PROLACTIN 4.3 03/29/2022   Lab Results  Component Value Date   CHOL 155 03/29/2022   TRIG 419 (H) 03/29/2022   HDL 26 (L) 03/29/2022   CHOLHDL 6.0 (H) 03/29/2022   LDLCALC  03/29/2022     Comment:     . LDL cholesterol not calculated. Triglyceride levels greater than 400 mg/dL invalidate calculated LDL results. . Reference range: <100 . Desirable range <100 mg/dL for primary prevention;   <70 mg/dL for patients with CHD or diabetic patients  with > or = 2 CHD risk factors. Aaron Aas LDL-C is now calculated using the Martin-Hopkins  calculation, which is a validated novel method providing   better accuracy than the Friedewald equation in the  estimation of LDL-C.  Melinda Sprawls et al. Erroll Heard. 4098;119(14): 2061-2068  (http://education.[employer].com/faq/FAQ164)    Lab Results  Component Value Date   TSH 1.438 09/06/2021    Therapeutic Level Labs: No results found for: "LITHIUM" No results found for: "VALPROATE" No results found for: "CBMZ"  Current Medications: Current Outpatient Medications  Medication Sig Dispense Refill   clindamycin (CLEOCIN) 300 MG capsule Take 300 mg by mouth 4 (four) times daily.     traZODone  (DESYREL ) 50 MG tablet Take 0.5-1 tablets (25-50 mg total) by mouth at bedtime as needed for sleep. 30 tablet 1   albuterol  (PROVENTIL ) (2.5 MG/3ML) 0.083% nebulizer solution INHALE 3 ML BY NEBULIZATION EVERY 6 HOURS AS NEEDED FOR WHEEZING OR SHORTNESS OF BREATH 150 mL 1   albuterol  (VENTOLIN  HFA) 108 (90 Base) MCG/ACT inhaler INHALE 1 TO 2 PUFFS BY MOUTH DAILY 6.7 each 5   BREO ELLIPTA  100-25 MCG/ACT AEPB TAKE 1 PUFF BY MOUTH EVERY DAY 60 each 4   brexpiprazole  (REXULTI ) 0.25 MG TABS tablet Take 1 tablet (0.25 mg total) by mouth daily. 30 tablet 1   buPROPion  ER (WELLBUTRIN  SR) 100 MG 12 hr tablet Take 1 tablet (100 mg total) by mouth daily with breakfast. 90 tablet 1   clonazePAM  (KLONOPIN ) 0.5 MG tablet Take 0.5-1 tablets (0.25-0.5 mg total) by mouth daily as needed for anxiety. Should last 30 days , please limit use 18 tablet 0   cyclobenzaprine (FLEXERIL) 10 MG tablet Take 10 mg by mouth 3 (three) times daily as needed for muscle spasms.     DULoxetine  (CYMBALTA ) 20 MG capsule Take 1 capsule (20 mg total) by mouth daily. Take along with 60 mg daily , total of 80 mg 90 capsule 1   DULoxetine  (CYMBALTA ) 60 MG capsule TAKE 1 CAPSULE BY MOUTH EVERY DAY, take along with 20 mg daily 90 capsule 1   gabapentin  (NEURONTIN ) 300 MG capsule Take one pill (300mg ) every morning and 2  pills (600mg ) in the evening     meloxicam  (MOBIC ) 7.5 MG tablet TAKE 1 TABLET BY  MOUTH EVERY DAY 30 tablet 3   omeprazole  (PRILOSEC) 40 MG capsule Take 1 capsule (40 mg total) by mouth daily. 90 capsule 3   propranolol  ER (INDERAL  LA) 60 MG 24 hr capsule Take 60 mg by mouth daily.     rosuvastatin  (CRESTOR ) 20 MG tablet TAKE 1 TABLET BY MOUTH EVERY DAY 90 tablet 3   topiramate  (TOPAMAX ) 100 MG tablet Take 100 mg by mouth daily.     Ubrogepant 100 MG TABS Take by mouth.     varenicline  (CHANTIX ) 1 MG tablet TAKE 1 TABLET BY MOUTH TWICE A DAY 180 tablet 0   No current facility-administered medications for this visit.     Musculoskeletal: Strength & Muscle Tone: UTA Gait & Station: Seated Patient leans: N/A  Psychiatric Specialty Exam: Review of Systems  Psychiatric/Behavioral:  Positive for dysphoric mood and sleep disturbance.        Grief    There were no vitals taken for this visit.There is no height or weight on file to calculate BMI.  General Appearance: Casual  Eye Contact:  Good  Speech:  Clear and Coherent  Volume:  Normal  Mood:  Depressed, and Grief  Affect:  Congruent  Thought Process:  Goal Directed and Descriptions of Associations: Intact  Orientation:  Full (Time, Place, and Person)  Thought Content: Logical   Suicidal Thoughts:  No  Homicidal Thoughts:  No  Memory:  Immediate;   Fair Recent;   Fair Remote;   Fair  Judgement:  Fair  Insight:  Fair  Psychomotor Activity:  Normal  Concentration:  Concentration: Fair and Attention Span: Fair  Recall:  [employer] of Knowledge: Fair  Language: Fair  Akathisia:  No  Handed:  Right  AIMS (if indicated): not done  Assets:  Desire for Improvement Housing Social Support Transportation  ADL's:  Intact  Cognition: WNL  Sleep:  Poor   Screenings: AIMS    Flowsheet Row Video Visit from 08/22/2022 in [facility] Video Visit from 05/22/2022 in [facility]  AIMS Total Score 0 0      AUDIT    Flowsheet Row  Office Visit from 05/22/2016 in [facility]  Alcohol Use Disorder Identification Test Final Score (AUDIT) 0      GAD-7    Flowsheet Row Office Visit from 10/16/2023 in [facility] Video Visit from 08/22/2022 in [facility] Counselor from 03/05/2022 in [facility] Counselor from 10/06/2020 in [facility] Office Visit from 08/10/2019 in [facility]  Total GAD-7 Score 16 11 20 15 14       Mini-Mental    Flowsheet Row Clinical Support from 09/27/2021 in [facility]  Total Score (max 30 points ) 29      PHQ2-9    Flowsheet Row Office Visit from 10/16/2023 in [facility] Office Visit from 10/14/2023 in [facility] Video Visit from 12/24/2022 in [facility] Clinical Support from 11/18/2022 in [facility] Office Visit from 11/01/2022 in [facility]  PHQ-2 Total Score 5 2 2 3  0  PHQ-9 Total  Score 19 -- 9 3 6       Flowsheet Row Video Visit from 05/07/2024 in [facility] Video Visit from 04/01/2024 in [facility] Video Visit from 11/17/2023 in [facility]  C-SSRS RISK CATEGORY Moderate Risk Moderate Risk Moderate Risk        Assessment and Plan: Mary Koch is a 54 year old Caucasian female who has a history of MDD, panic attacks, tobacco use disorder, caffeine use disorder, insomnia was evaluated by telemedicine today.  Discussed assessment and plan as noted below.  Panic disorder/Agoraphobia-improving Reports current anxiety symptoms as improving on  the current medication regimen. - Continue Clonazepam  0.5 mg half to 1 tablet 3-4 times a week only for severe anxiety symptoms. - Reviewed [place] PMP AWARxE - Continue Cymbalta  80 mg daily  Insomnia - unstable Currently reports sleep continues to be poor.  Recent grief likely has affected sleep as well.  Does report snoring and may have had sleep study 14 years ago which did not show sleep apnea. - Continue sleep hygiene techniques. - Start Trazodone  25-50 mg at bedtime as needed. - Consider referral for sleep study again.  We will discuss this in future sessions.  MDD -unstable Currently depression is complicated by comorbid grief.  Currently compliant on Cymbalta , Wellbutrin  and Rexulti  which has helped to manage mood symptoms. - Continue Cymbalta  80 mg daily - Continue Wellbutrin  SR 100 mg daily - Continue Rexulti  0.25 mg daily. - Continue psychotherapy sessions with Ms. Josiephine Nightingale, has a first appointment coming up on 05/11/2024.  Tobacco use disorder-unstable Increased smoking due to recent stressors.  - Provided counseling for 1 minute. - Continue Chantix  1 mg twice daily.  Caffeine use disorder-unstable Currently interested in cutting back and has started weaning herself off.  Encouraged cutting back. - Will reevaluate in future sessions.  Urine drug screen reviewed and discussed dated 04/02/2024-negative.  Follow-up Follow-up in clinic in 1 month or sooner if needed.   Collaboration of Care: Collaboration of Care: Referral or follow-up with counselor/therapist AEB patient encouraged to continue CBT.  Patient/Guardian was advised Release of Information must be obtained prior to any record release in order to collaborate their care with an outside provider. Patient/Guardian was advised if they have not already done so to contact the registration department to sign all necessary forms in order for us  to release information regarding their care.   Consent: Patient/Guardian  gives verbal consent for treatment and assignment of benefits for services provided during this visit. Patient/Guardian expressed understanding and agreed to proceed.   This note was generated in part or whole with voice recognition software. Voice recognition is usually quite accurate but there are transcription errors that can and very often do occur. I apologize for any typographical errors that were not detected and corrected.    Natahlia Hoggard, MD 05/07/2024, 1:11 PM

## 2024-05-11 ENCOUNTER — Ambulatory Visit (INDEPENDENT_AMBULATORY_CARE_PROVIDER_SITE_OTHER): Admitting: Professional Counselor

## 2024-05-11 DIAGNOSIS — F333 Major depressive disorder, recurrent, severe with psychotic symptoms: Secondary | ICD-10-CM | POA: Diagnosis not present

## 2024-05-11 NOTE — Progress Notes (Signed)
 Comprehensive Clinical Assessment (CCA) Note  05/11/2024 Mary Koch [id number]  Chief Complaint:  Chief Complaint  Patient presents with   Establish Care    "I want to feel better. I want to be able to go out and do things. I want the old me back."    Visit Diagnosis: MDD    CCA Screening, Triage and Referral (STR)  Patient Reported Information How did you hear about us ? Other (Comment)  Referral name: Established patient  Whom do you see for routine medical problems? I don't have a doctor  What Is the Reason for Your Visit/Call Today? Establish therapy  How Long Has This Been Causing You Problems? > than 6 months  What Do You Feel Would Help You the Most Today? Treatment for Depression or other mood problem  Have You Recently Been in Any Inpatient Treatment (Hospital/Detox/Crisis Center/28-Day Program)? No  Have You Ever Received Services From [employer] [facility] [facility] Before? Yes  Who Do You See at [facility]? Dr. Tere Felts  Have You Recently Had Any Thoughts About Hurting Yourself? No  Are You Planning to Commit Suicide/Harm Yourself At This time? No  Have you Recently Had Thoughts About Hurting Someone Marigene Shoulder? No  Have You Used Any Alcohol or Drugs in the Past 24 Hours? No  Do You Currently Have a Therapist/Psychiatrist? Yes  Name of Therapist/Psychiatrist: Dr. Tere Felts  Have You Been Recently Discharged From Any Office Practice or Programs? No    CCA Screening Triage Referral Assessment Type of Contact: Face-to-Face  Is this Initial or Reassessment? Initial  Collateral Involvement: None  Does Patient Have a Automotive engineer Guardian? No  Is CPS involved or ever been involved? In the Past ("It's been about 14 years ago. But that's just cause the pediatrician was mad I wouldn't put my son in public school. But she came out and checked everything and said that it was good.")  Is APS involved or ever been involved? Never  Patient Determined To Be At Risk for Harm  To Self or Others Based on Review of Patient Reported Information or Presenting Complaint? No  Are There Guns or Other Weapons in Your Home? No  Do You Have any Outstanding Charges, Pending Court Dates, Parole/Probation? No  Location of Assessment: Other (comment) ([facility])  Does Patient Present under Involuntary Commitment? No  County of Residence: [place]  Patient Currently Receiving the Following Services: Medication Management  Determination of Need: Routine (7 days)  Options For Referral: Outpatient Therapy   CCA Biopsychosocial Intake/Chief Complaint:  Depression  Current Symptoms/Problems: "Right now it's my depression and missing my brother and death in general."  Patient Reported Schizophrenia/Schizoaffective Diagnosis in Past: No  Strengths: "I don't know."  Preferences: "It really don't matter to me. Virtual is fine with me."  Abilities: "Taking care of my animals. I can cook."  Type of Services Patient Feels are Needed: "I don't know really. I guess I just give up."  Initial Clinical Notes/Concerns: No data recorded  Mental Health Symptoms Depression:  Change in energy/activity; Fatigue; Hopelessness; Increase/decrease in appetite; Sleep (too much or little); Weight gain/loss; Worthlessness   Duration of Depressive symptoms: Less than two weeks   Mania:  None   Anxiety:   Difficulty concentrating; Fatigue; Irritability; Sleep; Worrying   Psychosis:  Hallucinations ("Sometimes when I'm driving I see stuff cross the road and ain't nothing there or sometimes I hear stuff in the house when ain't nothing there. I just think it's a ghost coming to visit me and  leave it at that.")   Duration of Psychotic symptoms: Greater than six months   Trauma:  None   Obsessions:  None   Compulsions:  None   Inattention:  None   Hyperactivity/Impulsivity:  None   Oppositional/Defiant Behaviors:  None   Emotional Irregularity:  None   Other Mood/Personality  Symptoms:  No data recorded   Mental Status Exam Appearance and self-care  Stature:  Tall   Weight:  Overweight   Clothing:  Neat/clean   Grooming:  Normal   Cosmetic use:  Age appropriate   Posture/gait:  Normal   Motor activity:  Not Remarkable   Sensorium  Attention:  Normal   Concentration:  Normal   Orientation:  X5   Recall/memory:  Normal   Affect and Mood  Affect:  Appropriate   Mood:  Dysphoric   Relating  Eye contact:  Normal   Facial expression:  Responsive   Attitude toward examiner:  Cooperative   Thought and Language  Speech flow: Clear and Coherent   Thought content:  Appropriate to Mood and Circumstances   Preoccupation:  None   Hallucinations:  None   Organization:  No data recorded  [employer] of Knowledge:  Fair   Intelligence:  Average   Abstraction:  Functional   Judgement:  Fair   Dance movement psychotherapist:  Realistic   Insight:  Fair   Decision Making:  Normal   Social Functioning  Social Maturity:  Isolates   Social Judgement:  Normal   Stress  Stressors:  Grief/losses; Illness; Family conflict (Brother passed May 1st, Mother has breast cancer, Son being a "typical 68 year old.")   Coping Ability:  No data recorded  Skill Deficits:  Activities of daily living; Self-care; Responsibility   Supports:  Support needed ("I used to have my best friend but she just up and stopped coming around. I don't really have no help." Exhusband allows her to use the car though.)       05/11/2024    1:10 PM 10/16/2023   11:59 AM 10/14/2023    3:25 PM  Depression screen PHQ 2/9  Decreased Interest 3 2 1   Down, Depressed, Hopeless 2 3 1   PHQ - 2 Score 5 5 2   Altered sleeping 3 2   Tired, decreased energy 3 3   Change in appetite 2 3   Feeling bad or failure about yourself  3 2   Trouble concentrating 1 3   Moving slowly or fidgety/restless 1 1   Suicidal thoughts 0 0   PHQ-9 Score 18 19   Difficult doing work/chores  Very difficult Extremely dIfficult       05/11/2024    1:09 PM 10/16/2023   11:59 AM 08/22/2022   10:17 AM 03/05/2022    9:22 AM  GAD 7 : Generalized Anxiety Score  Nervous, Anxious, on Edge 3 3 2 3   Control/stop worrying 3 2 3 3   Worry too much - different things 3 3 3 3   Trouble relaxing 1 3 0 3  Restless 0 1 1 2   Easily annoyed or irritable 3 3 0 3  Afraid - awful might happen 1 1 2 3   Total GAD 7 Score 14 16 11 20   Anxiety Difficulty Somewhat difficult Very difficult Very difficult Somewhat difficult   Religion: Religion/Spirituality Are You A Religious Person?: Yes How Might This Affect Treatment?: Reports she believes in God but doesn't want to call herself a [place] as that would be a lie.  Leisure/Recreation: Leisure / Recreation Do You Have Hobbies?: Yes Leisure and Hobbies: "Taking care of all my animals. I got 15 dogs, 7 cats, and a turtle." Has a cow in pasture too  Exercise/Diet: Exercise/Diet Do You Exercise?: Yes What Type of Exercise Do You Do?: Run/Walk How Many Times a Week Do You Exercise?: 1-3 times a week Have You Gained or Lost A Significant Amount of Weight in the Past Six Months?: Yes-Gained Do You Follow a Special Diet?: No Do You Have Any Trouble Sleeping?: Yes Explanation of Sleeping Difficulties: Staying asleep or sleeping too much   CCA Employment/Education Employment/Work Situation: Employment / Work Situation Employment Situation: On disability Why is Patient on Disability: Depression, anxiety, "my hands where I got carpal tunnel." How Long has Patient Been on Disability: Years What is the Longest Time Patient has Held a Job?: 5 years Where was the Patient Employed at that Time?: [employer] Has Patient ever Been in the [employer]?: No  Education: Education Is Patient Currently Attending School?: No Did Garment/textile technologist From [employer]?: No (Reports "people" got in the way of finishing school. Obtained GED in 2001.) Did You Attend College?:  No Did You Attend Graduate School?: No Did You Have An Individualized Education Program (IIEP): No Did You Have Any Difficulty At School?: No Patient's Education Has Been Impacted by Current Illness: No   CCA Family/Childhood History Family and Relationship History: Family history Marital status: Separated Separated, when?: 2012 What types of issues is patient dealing with in the relationship?: "I was married and he wasn't. He didn't cheat but he was going to topless bars and going out to eat and not thinking about my and my youngin." Are you sexually active?: Yes What is your sexual orientation?: Heterosexual Has your sexual activity been affected by drugs, alcohol, medication, or emotional stress?: "No." Does patient have children?: Yes How many children?: 2 How is patient's relationship with their children?: Two sons, 42 and 84 y.o. Oldest is helpful, youngest is being a typical teen  Childhood History:  Childhood History By whom was/is the patient raised?: Both parents Additional childhood history information: Raised by both parents, described childhood as "good. We got what we needed and at Christmas time we got what we wanted." Description of patient's relationship with caregiver when they were a child: Mother - "Good." Father - "Good." Patient's description of current relationship with people who raised him/her: Mother - "Good. My momma still buying clothes for them and stuff like that."  " Father passed in 2019, "It was good. They helped me raise my youngins." Does patient have siblings?: Yes Number of Siblings: 3 Description of patient's current relationship with siblings: Two brothers and one sister, one brother passed on 05/03/25not close to other brother or sister Did patient suffer any verbal/emotional/physical/sexual abuse as a child?: No Did patient suffer from severe childhood neglect?: No Has patient ever been sexually abused/assaulted/raped as an adolescent or  adult?: No Was the patient ever a victim of a crime or a disaster?: No Witnessed domestic violence?: No Has patient been affected by domestic violence as an adult?: No   CCA Substance Use Alcohol/Drug Use: Alcohol / Drug Use Pain Medications: See MAR Prescriptions: See MAR Over the Counter: See MAR History of alcohol / drug use?: No history of alcohol / drug abuse  ASAM's:  Six Dimensions of Multidimensional Assessment  Dimension 1:  Acute Intoxication and/or Withdrawal Potential:      Dimension 2:  Biomedical Conditions and Complications:  Dimension 3:  Emotional, Behavioral, or Cognitive Conditions and Complications:     Dimension 4:  Readiness to Change:     Dimension 5:  Relapse, Continued use, or Continued Problem Potential:     Dimension 6:  Recovery/Living Environment:     ASAM Severity Score:    ASAM Recommended Level of Treatment:     Substance use Disorder (SUD) N/A    Recommendations for Services/Supports/Treatments: N/A    DSM5 Diagnoses: Patient Active Problem List   Diagnosis Date Noted   Well woman exam 08/05/2023   MDD (major depressive disorder), recurrent, in full remission (HCC) 08/14/2022   Hot flashes 03/29/2022   Shortness of breath 09/25/2021   Severe episode of recurrent major depressive disorder, without psychotic features (HCC) 09/13/2021   EKG abnormalities 09/10/2021   Insomnia due to medical condition 09/05/2021   At risk for prolonged QT interval syndrome 09/05/2021   Prediabetes 03/30/2021   Chronic pain of left knee 03/30/2021   MDD (major depressive disorder), recurrent episode, moderate (HCC) 11/13/2020   Essential hypertension 10/23/2020   Other specified diabetes mellitus with other specified complication (HCC) 10/23/2020   Recurrent major depressive disorder, in full remission (HCC) 07/25/2020   Tick bite 07/10/2020   Blurred vision 07/10/2020   Lethargy 07/10/2020   High risk medication use 04/06/2020   Panic disorder  07/28/2019   Bereavement 07/28/2019   MDD (major depressive disorder), recurrent episode, with atypical features (HCC) 07/28/2019   Agoraphobia 07/28/2019   Insomnia due to other mental disorder 07/28/2019   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   Tobacco use disorder 12/16/2017   Anxiety 06/25/2015   Carpal tunnel syndrome 06/25/2015   Family planning 06/25/2015   Bloodgood disease 06/25/2015   Fibroid 06/25/2015   Acid reflux 06/25/2015   Excess, menstruation 06/25/2015   Obesity (BMI 30-39.9) 06/25/2015   Class 3 severe obesity due to excess calories with serious comorbidity and body mass index (BMI) of 40.0 to 44.9 in adult 06/25/2015   Dysmenorrhea 06/25/2015   Disease of thyroid  gland 06/25/2015   Compulsive tobacco user syndrome 06/25/2015   Episodic tension type headache 08/23/2014   Allergic rhinitis 07/23/2013   Wheezing 07/23/2013   Lower urinary tract infectious disease 03/11/2013   Hematuria 03/11/2013   Diarrhea 01/13/2013   Vomiting 01/13/2013   Amenorrhea 12/22/2012   HLD (hyperlipidemia) 08/31/2011   Referrals to Alternative Service(s): Referred to Alternative Service(s):   Place:   Date:   Time:    Referred to Alternative Service(s):   Place:   Date:   Time:    Referred to Alternative Service(s):   Place:   Date:   Time:    Referred to Alternative Service(s):   Place:   Date:   Time:     Collaboration of Care: Medication Management AEB chart review  Summary: Mary Koch is a separated 53 y.o. Caucasian female. She presents to [facility] to re-establish outpatient therapy. She is currently engaged in medication management with Dr. Eappen. She reported the following reason for seeking therapy, "I want to feel better. I want to be able to go out and do things. I want the old me back."   Mary Koch appeared dysphoric but oriented x5. She was neatly dressed and appeared well-groomed. She was cooperative and responsive during assessment. Her speech was normal in tone/volume, thought  content/process was logical and linear. She denied current SI/HI. She reported one suicide attempt, "40 years ago." She reported ongoing AVH, seeing things crossing the road while driving or hearing things  in the house. She reported this has really been going on since she initially started having mental health issues, but she hasn't shared it previously. Mary Koch denied mania, OCD, or ADHD.   Mary Koch was raised by both parents. She reported her childhood was good and they got everything they needed and things they wanted for Christmas. She has two brothers and one sister. She was closest to her brother who recently passed away. She reported good relationship with her parents. Her father passed in 2019 and her mother currently has breast cancer. Mary Koch was married and has two sons, age 58 and 90, from this marriage. They separated in 2012. She continues to reside in one of her ex-husband's properties and stated he offers some support, like she drove his truck to today's appointment. She is close with her oldest son, who will also help her out if needed. She reported her youngest son, is "being a typical teenager." She denies having friends, citing her best friend stopped coming around about a year ago.  Mary Koch denied issues in school but didn't complete traditional high school. She obtained her GED in 2001. She reported she worked at [employer] and [employer] in the past. She has been on disability for years, due to mental health and carpal tunnel. She enjoys taking care of her 15 dogs, 7 cats, and 1 turtle.   Mary Koch meets criteria for the following: F33.3 Major depressive disorder, recurrent episode, severe with psychotic features AEB depressed mood most of the day, nearly every day; feelings of hopelessness, worthlessness, or emptiness; significant weight changes; sleep disturbances of insomnia/hypersomnia; fatigue; diminished ability to think/concentrate and AVH.  Recommendations: Mary Koch is recommended to continue  with medication management and engage in outpatient therapy. She is in agreement with these recommendations. Mary Koch has been advised of confidentiality limitations and no-show policy.   Patient/Guardian was advised Release of Information must be obtained prior to any record release in order to collaborate their care with an outside provider. Patient/Guardian was advised if they have not already done so to contact the registration department to sign all necessary forms in order for us  to release information regarding their care.   Consent: Patient/Guardian gives verbal consent for treatment and assignment of benefits for services provided during this visit. Patient/Guardian expressed understanding and agreed to proceed.   Len Quale, LCMHC

## 2024-06-04 ENCOUNTER — Telehealth: Admitting: Psychiatry

## 2024-06-04 ENCOUNTER — Encounter: Payer: Self-pay | Admitting: Psychiatry

## 2024-06-04 DIAGNOSIS — G4701 Insomnia due to medical condition: Secondary | ICD-10-CM | POA: Diagnosis not present

## 2024-06-04 DIAGNOSIS — F4000 Agoraphobia, unspecified: Secondary | ICD-10-CM

## 2024-06-04 DIAGNOSIS — F1590 Other stimulant use, unspecified, uncomplicated: Secondary | ICD-10-CM | POA: Diagnosis not present

## 2024-06-04 DIAGNOSIS — F17200 Nicotine dependence, unspecified, uncomplicated: Secondary | ICD-10-CM | POA: Diagnosis not present

## 2024-06-04 DIAGNOSIS — F410 Panic disorder [episodic paroxysmal anxiety] without agoraphobia: Secondary | ICD-10-CM | POA: Diagnosis not present

## 2024-06-04 DIAGNOSIS — F331 Major depressive disorder, recurrent, moderate: Secondary | ICD-10-CM

## 2024-06-04 MED ORDER — TRAZODONE HCL 50 MG PO TABS
50.0000 mg | ORAL_TABLET | Freq: Every evening | ORAL | 1 refills | Status: DC | PRN
Start: 2024-06-04 — End: 2024-07-30

## 2024-06-04 MED ORDER — BREXPIPRAZOLE 0.25 MG PO TABS
0.2500 mg | ORAL_TABLET | Freq: Every day | ORAL | 1 refills | Status: AC
Start: 2024-06-04 — End: ?

## 2024-06-04 NOTE — Progress Notes (Signed)
 Virtual Visit via Video Note  I connected with Mary Koch on 06/04/24 at  9:20 AM EDT by a video enabled telemedicine application and verified that I am speaking with the correct person using two identifiers.  Location Provider Location : [facility] Patient Location : Home  Participants: Patient , Provider   I discussed the limitations of evaluation and management by telemedicine and the availability of in person appointments. The patient expressed understanding and agreed to proceed.   I discussed the assessment and treatment plan with the patient. The patient was provided an opportunity to ask questions and all were answered. The patient agreed with the plan and demonstrated an understanding of the instructions.   The patient was advised to call back or seek an in-person evaluation if the symptoms worsen or if the condition fails to improve as anticipated.  [facility] MD OP Progress Note  06/04/2024 11:40 AM Mary Koch  MRN:  [id number]  Chief Complaint:  Chief Complaint  Patient presents with   Follow-up   Anxiety   Depression   Insomnia    Discussed the use of AI scribe software for clinical note transcription with the patient, who gave verbal consent to proceed.  History of Present Illness Mary Koch is a 53 year old Caucasian female who lives in [place], has a history of MDD, panic disorder, agoraphobia, insomnia, tobacco use disorder, caffeine use disorder was evaluated by telemedicine today.  She continues to experience insomnia despite taking trazodone  at 9 PM. She falls asleep around midnight or 1 AM, wakes up at 3:30 AM, and again at 6:30 or 7 AM. There is no specific reason for waking up at 3:30 AM, such as pain or the need to urinate. She spends the time between taking the medication and falling asleep lying in bed with the TV on, trying to keep her eyes closed to avoid the light.She consumes caffeine primarily in the morning, limiting intake after lunchtime, and drinks  two 12-ounce cans of [employer] daily. She smokes cigarettes approximately every 1.5 to 2 hours during the day, with the last cigarette at 9 PM before bed.  Likely this is also affecting her sleep.  She does have Chantix  available however she is not compliant.  She uses it only some days.  She is struggling with grief following the loss of her brother, with her emotional state being variable. She has started seeing therapist and has an upcoming in-person therapy appointment scheduled for June 18th.  Denies suicidal thoughts or thoughts of harming others.  She is currently compliant on her medications including Rexulti , Cymbalta , Wellbutrin , denies side effects.   Visit Diagnosis:    ICD-10-CM   1. MDD (major depressive disorder), recurrent episode, moderate (HCC)  F33.1 traZODone  (DESYREL ) 50 MG tablet    brexpiprazole  (REXULTI ) 0.25 MG TABS tablet    2. Panic disorder  F41.0     3. Agoraphobia  F40.00     4. Insomnia due to medical condition  G47.01 traZODone  (DESYREL ) 50 MG tablet   Multiple including sleep hygiene techniques mood symptoms, grief, rule out sleep apnea, caffeine intake, smoking    5. Tobacco use disorder  F17.200     6. Caffeine use disorder  F15.90       Past Psychiatric History: I have reviewed past psychiatric history from progress note on 03/24/2018.  Past trials of Paxil, Luvox, Klonopin , Zoloft, Seroquel , Wellbutrin , Ambien   Past Medical History:  Past Medical History:  Diagnosis Date   Allergic rhinitis  Anemia    Anxiety    Anxiety and depression    Carpal tunnel syndrome    COVID-19 01/15/2020   Depression    Fibroids    GERD (gastroesophageal reflux disease)    Headache(784.0)    Headache(784.0)    Heavy periods    Hyperthyroidism    right portion of thyroid  removed   Muscle spasm    Neurological disorder    evaluation for ms   Painful menstrual periods    Shortness of breath    when i smoke a lot    Past Surgical History:   Procedure Laterality Date   BREAST BIOPSY Left    neg   carpel tunnel Left 2017   CESAREAN SECTION     times 2   CHOLECYSTECTOMY     THYROID  LOBECTOMY      Family Psychiatric History: I have reviewed family psychiatric history from progress note on 03/24/2018.  Family History:  Family History  Problem Relation Age of Onset   Osteoporosis Mother    Breast cancer Mother 69   Diabetes Father    Anxiety disorder Father    Depression Father    Post-traumatic stress disorder Father    Heart disease Father    Lung cancer Father    Anxiety disorder Sister    Alcohol abuse Brother    Colon cancer Neg Hx    Ovarian cancer Neg Hx     Social History: I have reviewed social history from progress note on 03/24/2018. Social History   Socioeconomic History   Marital status: Legally Separated    Spouse name: Not on file   Number of children: 2   Years of education: Not on file   Highest education level: High school graduate  Occupational History   Not on file  Tobacco Use   Smoking status: Every Day    Types: Cigarettes    Start date: 05/22/1986   Smokeless tobacco: Never   Tobacco comments:    Currently 1-2 cig per day - cut back- 03/25/23  Vaping Use   Vaping status: Never Used  Substance and Sexual Activity   Alcohol use: No    Alcohol/week: 0.0 standard drinks of alcohol   Drug use: No   Sexual activity: Yes    Partners: Male    Birth control/protection: Surgical, Injection  Other Topics Concern   Not on file  Social History Narrative   Not on file   Social Drivers of Health   Financial Resource Strain: Medium Risk (05/11/2024)   Overall Financial Resource Strain (CARDIA)    Difficulty of Paying Living Expenses: Somewhat hard  Food Insecurity: No Food Insecurity (05/11/2024)   Hunger Vital Sign    Worried About Running Out of Food in the Last Year: Never true    Ran Out of Food in the Last Year: Never true  Transportation Needs: No Transportation Needs (05/11/2024)    PRAPARE - Administrator, Civil Service (Medical): No    Lack of Transportation (Non-Medical): No  Physical Activity: Insufficiently Active (05/11/2024)   Exercise Vital Sign    Days of Exercise per Week: 5 days    Minutes of Exercise per Session: 10 min  Stress: Stress Concern Present (05/11/2024)   [employer] of Occupational Health - Occupational Stress Questionnaire    Feeling of Stress : Very much  Social Connections: Socially Isolated (05/11/2024)   Social Connection and Isolation Panel [NHANES]    Frequency of Communication with Friends and Family: More than  three times a week    Frequency of Social Gatherings with Friends and Family: Never    Attends Religious Services: Never    Database administrator or Organizations: No    Attends Banker Meetings: Never    Marital Status: Separated    Allergies:  Allergies  Allergen Reactions   Azithromycin Other (See Comments)    headache   Codeine Other (See Comments)    unknown   Penicillins Other (See Comments)    headaches    Metabolic Disorder Labs: Lab Results  Component Value Date   HGBA1C 5.0 08/02/2022   Lab Results  Component Value Date   PROLACTIN 4.3 03/29/2022   Lab Results  Component Value Date   CHOL 155 03/29/2022   TRIG 419 (H) 03/29/2022   HDL 26 (L) 03/29/2022   CHOLHDL 6.0 (H) 03/29/2022   LDLCALC  03/29/2022     Comment:     . LDL cholesterol not calculated. Triglyceride levels greater than 400 mg/dL invalidate calculated LDL results. . Reference range: <100 . Desirable range <100 mg/dL for primary prevention;   <70 mg/dL for patients with CHD or diabetic patients  with > or = 2 CHD risk factors. Aaron Aas LDL-C is now calculated using the Martin-Hopkins  calculation, which is a validated novel method providing  better accuracy than the Friedewald equation in the  estimation of LDL-C.  Melinda Sprawls et al. Erroll Heard. 4540;981(19): 2061-2068   (http://education.[employer].com/faq/FAQ164)    Lab Results  Component Value Date   TSH 1.438 09/06/2021    Therapeutic Level Labs: No results found for: "LITHIUM" No results found for: "VALPROATE" No results found for: "CBMZ"  Current Medications: Current Outpatient Medications  Medication Sig Dispense Refill   albuterol  (PROVENTIL ) (2.5 MG/3ML) 0.083% nebulizer solution INHALE 3 ML BY NEBULIZATION EVERY 6 HOURS AS NEEDED FOR WHEEZING OR SHORTNESS OF BREATH 150 mL 1   albuterol  (VENTOLIN  HFA) 108 (90 Base) MCG/ACT inhaler INHALE 1 TO 2 PUFFS BY MOUTH DAILY 6.7 each 5   BREO ELLIPTA  100-25 MCG/ACT AEPB TAKE 1 PUFF BY MOUTH EVERY DAY 60 each 4   brexpiprazole  (REXULTI ) 0.25 MG TABS tablet Take 1 tablet (0.25 mg total) by mouth daily. 30 tablet 1   buPROPion  ER (WELLBUTRIN  SR) 100 MG 12 hr tablet Take 1 tablet (100 mg total) by mouth daily with breakfast. 90 tablet 1   clindamycin (CLEOCIN) 300 MG capsule Take 300 mg by mouth 4 (four) times daily.     clonazePAM  (KLONOPIN ) 0.5 MG tablet Take 0.5-1 tablets (0.25-0.5 mg total) by mouth daily as needed for anxiety. Should last 30 days , please limit use 18 tablet 0   cyclobenzaprine (FLEXERIL) 10 MG tablet Take 10 mg by mouth 3 (three) times daily as needed for muscle spasms.     DULoxetine  (CYMBALTA ) 20 MG capsule Take 1 capsule (20 mg total) by mouth daily. Take along with 60 mg daily , total of 80 mg 90 capsule 1   DULoxetine  (CYMBALTA ) 60 MG capsule TAKE 1 CAPSULE BY MOUTH EVERY DAY, take along with 20 mg daily 90 capsule 1   gabapentin  (NEURONTIN ) 300 MG capsule Take one pill (300mg ) every morning and 2 pills (600mg ) in the evening     meloxicam  (MOBIC ) 7.5 MG tablet TAKE 1 TABLET BY MOUTH EVERY DAY 30 tablet 3   omeprazole  (PRILOSEC) 40 MG capsule Take 1 capsule (40 mg total) by mouth daily. 90 capsule 3   propranolol  ER (INDERAL  LA) 60 MG 24 hr  capsule Take 60 mg by mouth daily.     rosuvastatin  (CRESTOR ) 20 MG tablet TAKE 1  TABLET BY MOUTH EVERY DAY 90 tablet 3   topiramate  (TOPAMAX ) 100 MG tablet Take 100 mg by mouth daily.     traZODone  (DESYREL ) 50 MG tablet Take 1-2 tablets (50-100 mg total) by mouth at bedtime as needed for sleep. 60 tablet 1   Ubrogepant 100 MG TABS Take by mouth.     varenicline  (CHANTIX ) 1 MG tablet TAKE 1 TABLET BY MOUTH TWICE A DAY 180 tablet 0   No current facility-administered medications for this visit.     Musculoskeletal: Strength & Muscle Tone: UTA Gait & Station: Seated Patient leans: N/A  Psychiatric Specialty Exam: Review of Systems  Psychiatric/Behavioral:  Positive for dysphoric mood and sleep disturbance.        Grief    There were no vitals taken for this visit.There is no height or weight on file to calculate BMI.  General Appearance: Casual  Eye Contact:  Fair  Speech:  Normal Rate  Volume:  Normal  Mood:  Depressed, grief  Affect:  Congruent  Thought Process:  Goal Directed and Descriptions of Associations: Intact  Orientation:  Full (Time, Place, and Person)  Thought Content: Logical   Suicidal Thoughts:  No  Homicidal Thoughts:  No  Memory:  Immediate;   Fair Recent;   Fair Remote;   Fair  Judgement:  Fair  Insight:  Fair  Psychomotor Activity:  Normal  Concentration:  Concentration: Fair and Attention Span: Fair  Recall:  [employer] of Knowledge: Fair  Language: Fair  Akathisia:  No  Handed:  Right  AIMS (if indicated): not done  Assets:  Therapist, sports  ADL's:  Intact  Cognition: WNL  Sleep:  Poor   Screenings: AIMS    Flowsheet Row Video Visit from 08/22/2022 in [facility] Video Visit from 05/22/2022 in [facility]  AIMS Total Score 0 0      AUDIT    Flowsheet Row Office Visit from 05/22/2016 in [facility]  Alcohol Use Disorder Identification Test Final Score  (AUDIT) 0      GAD-7    Flowsheet Row Counselor from 05/11/2024 in [facility] Office Visit from 10/16/2023 in [facility] Video Visit from 08/22/2022 in [facility] Counselor from 03/05/2022 in [facility] Counselor from 10/06/2020 in [facility]  Total GAD-7 Score 14 16 11 20 15       Mini-Mental    Flowsheet Row Clinical Support from 09/27/2021 in [facility]  Total Score (max 30 points ) 29      PHQ2-9    Flowsheet Row Counselor from 05/11/2024 in [facility] Office Visit from 10/16/2023 in [facility] Office Visit from 10/14/2023 in [facility] Video Visit from 12/24/2022 in [facility] Clinical Support from 11/18/2022 in [facility]  PHQ-2 Total Score 5 5 2 2 3   PHQ-9 Total Score 18 19 -- 9 3      Flowsheet Row Video Visit from 06/04/2024 in [facility] Counselor from 05/11/2024 in [facility] Video Visit from 05/07/2024 in [facility]  C-SSRS RISK CATEGORY Moderate Risk Moderate Risk Moderate Risk        Assessment and Plan: Mary Koch is a 53 year old Caucasian female who has a history of MDD, panic attacks, tobacco use disorder, caffeine use disorder, insomnia was evaluated by telemedicine today.  Discussed assessment and plan as noted below.  Panic disorder/Agoraphobia-improving Currently reports anxiety symptoms overall managed with the current medication regimen. Continue Clonazepam  0.5 mg takes half to 1 tablet 3-4 times a  week only for severe anxiety Continue Cymbalta  80 mg daily Reviewed [place] AWARxE  Insomnia-unstable Currently sleep continues to be interrupted likely due to lack of sleep hygiene, smoking as well as caffeine use.Currently tolerating trazodone  well. Increase Trazodone  to 50 to 100 mg at bedtime as needed Discussed sleep hygiene techniques including avoiding caffeine, smoking cessation, electronic devices. Consider referral for sleep study again.  MDD-improving Currently reports coping with grief better than before.  Currently compliant on medications like Cymbalta , Rexulti  and Wellbutrin  which are beneficial. Continue Cymbalta  80 mg daily Continue Wellbutrin  SR 100 mg daily Continue Rexulti  0.25 mg daily Continue CBT with Ms. Josiephine Nightingale  Tobacco use disorder-unstable She continues to smoke cigarettes and is not compliant on the Chantix  Provided counseling for 1 minute.   Encouraged to use Chantix  1 mg twice daily and to request a refill as needed.  Caffeine use disorder-unstable Continues to use caffeine which likely affecting sleep Encouraged to cut back  Follow-up Follow-up in clinic in 8 weeks or sooner if needed.   Collaboration of Care: Collaboration of Care: Referral or follow-up with counselor/therapist AEB encouraged to continue CBT  Patient/Guardian was advised Release of Information must be obtained prior to any record release in order to collaborate their care with an outside provider. Patient/Guardian was advised if they have not already done so to contact the registration department to sign all necessary forms in order for us  to release information regarding their care.   Consent: Patient/Guardian gives verbal consent for treatment and assignment of benefits for services provided during this visit. Patient/Guardian expressed understanding and agreed to proceed.  This note was generated in part or whole with voice recognition software. Voice recognition is  usually quite accurate but there are transcription errors that can and very often do occur. I apologize for any typographical errors that were not detected and corrected.     Durwin Davisson, MD 06/04/2024, 11:40 AM

## 2024-06-14 ENCOUNTER — Ambulatory Visit (INDEPENDENT_AMBULATORY_CARE_PROVIDER_SITE_OTHER)

## 2024-06-14 VITALS — BP 118/77 | HR 87 | Ht 66.5 in | Wt 239.0 lb

## 2024-06-14 DIAGNOSIS — Z3042 Encounter for surveillance of injectable contraceptive: Secondary | ICD-10-CM

## 2024-06-14 MED ORDER — MEDROXYPROGESTERONE ACETATE 150 MG/ML IM SUSY
150.0000 mg | PREFILLED_SYRINGE | Freq: Once | INTRAMUSCULAR | Status: AC
Start: 2024-06-14 — End: 2024-06-14
  Administered 2024-06-14: 150 mg via INTRAMUSCULAR

## 2024-06-14 NOTE — Progress Notes (Signed)
    NURSE VISIT NOTE  Subjective:    Patient ID: Mary Koch, female    DOB: 11/29/1971, 53 y.o.   MRN: [id number]  HPI  Patient is a 53 y.o. G109P2002 female who presents for depo provera  injection.   Objective:    BP 118/77   Pulse 87   Ht 5' 6.5 (1.689 m)   Wt 239 lb (108.4 kg)   BMI 38.00 kg/m   Last Annual: 08/05/23. Last pap: 6/8/023. Last Depo-Provera : 03/22/24. Side Effects if any: none. Serum HCG indicated? No . Depo-Provera  150 mg IM given by: Ila Malay, CMA. Site: Right Deltoid    Assessment:   1. Surveillance for Depo-Provera  contraception      Plan:   Next appointment due between SEPT 1-15    Abram Abraham, CMA

## 2024-06-16 ENCOUNTER — Ambulatory Visit: Admitting: Family Medicine

## 2024-06-16 ENCOUNTER — Encounter: Payer: Self-pay | Admitting: Family Medicine

## 2024-06-16 ENCOUNTER — Telehealth: Payer: Self-pay | Admitting: Family Medicine

## 2024-06-16 VITALS — BP 120/68 | HR 77 | Resp 16 | Ht 66.0 in | Wt 236.0 lb

## 2024-06-16 DIAGNOSIS — Z1211 Encounter for screening for malignant neoplasm of colon: Secondary | ICD-10-CM

## 2024-06-16 DIAGNOSIS — E785 Hyperlipidemia, unspecified: Secondary | ICD-10-CM | POA: Diagnosis not present

## 2024-06-16 DIAGNOSIS — E890 Postprocedural hypothyroidism: Secondary | ICD-10-CM

## 2024-06-16 DIAGNOSIS — E66812 Obesity, class 2: Secondary | ICD-10-CM

## 2024-06-16 DIAGNOSIS — R140 Abdominal distension (gaseous): Secondary | ICD-10-CM

## 2024-06-16 DIAGNOSIS — R197 Diarrhea, unspecified: Secondary | ICD-10-CM

## 2024-06-16 DIAGNOSIS — I10 Essential (primary) hypertension: Secondary | ICD-10-CM | POA: Diagnosis not present

## 2024-06-16 DIAGNOSIS — Z0000 Encounter for general adult medical examination without abnormal findings: Secondary | ICD-10-CM | POA: Diagnosis not present

## 2024-06-16 DIAGNOSIS — Z5181 Encounter for therapeutic drug level monitoring: Secondary | ICD-10-CM

## 2024-06-16 DIAGNOSIS — Z1159 Encounter for screening for other viral diseases: Secondary | ICD-10-CM | POA: Diagnosis not present

## 2024-06-16 DIAGNOSIS — K219 Gastro-esophageal reflux disease without esophagitis: Secondary | ICD-10-CM

## 2024-06-16 DIAGNOSIS — Z6838 Body mass index (BMI) 38.0-38.9, adult: Secondary | ICD-10-CM

## 2024-06-16 DIAGNOSIS — F3342 Major depressive disorder, recurrent, in full remission: Secondary | ICD-10-CM

## 2024-06-16 DIAGNOSIS — R7303 Prediabetes: Secondary | ICD-10-CM | POA: Diagnosis not present

## 2024-06-16 DIAGNOSIS — F1520 Other stimulant dependence, uncomplicated: Secondary | ICD-10-CM | POA: Diagnosis not present

## 2024-06-16 NOTE — Progress Notes (Unsigned)
 Name: Mary Koch   MRN: [id number]    DOB: 1971-04-27   Date:06/16/2024       Progress Note  Chief Complaint  Patient presents with   Establish Care     Subjective:   Mary Koch is a 53 y.o. female, presents to clinic to establish care   Pt prior pcp was managing diabetes/prediabetes?, HTN, HLD, and joint pain (meloxicam )  She also sees Dr. Tere Felts and neurology   She remembers her last A1c was 6.0 and she was started on ozempic  and then wegovy  - started at 1 mg dose and was very sick with itoff meds earlier this year when the clinic closed Lab Results  Component Value Date   HGBA1C 5.0 08/02/2022    Taking propanolol BID cause her BP was high earlier this year  She is prescribed 60 mg once daily BP Readings from Last 3 Encounters:  06/16/24 120/68  06/14/24 118/77  03/22/24 (!) 142/68   Pulse Readings from Last 3 Encounters:  06/16/24 77  06/14/24 87  03/22/24 83   HLD on statin Lab Results  Component Value Date   CHOL 155 03/29/2022   HDL 26 (L) 03/29/2022   LDLCALC  03/29/2022     Comment:     . LDL cholesterol not calculated. Triglyceride levels greater than 400 mg/dL invalidate calculated LDL results. . Reference range: <100 . Desirable range <100 mg/dL for primary prevention;   <70 mg/dL for patients with CHD or diabetic patients  with > or = 2 CHD risk factors. Aaron Aas LDL-C is now calculated using the Martin-Hopkins  calculation, which is a validated novel method providing  better accuracy than the Friedewald equation in the  estimation of LDL-C.  Melinda Sprawls et al. Erroll Heard. 7829;562(13): 2061-2068  (http://education.[facility].com/faq/FAQ164)    TRIG 419 (H) 03/29/2022   CHOLHDL 6.0 (H) 03/29/2022           Current Outpatient Medications:    albuterol  (PROVENTIL ) (2.5 MG/3ML) 0.083% nebulizer solution, INHALE 3 ML BY NEBULIZATION EVERY 6 HOURS AS NEEDED FOR WHEEZING OR SHORTNESS OF BREATH, Disp: 150 mL, Rfl: 1   albuterol   (VENTOLIN  HFA) 108 (90 Base) MCG/ACT inhaler, INHALE 1 TO 2 PUFFS BY MOUTH DAILY, Disp: 6.7 each, Rfl: 5   BREO ELLIPTA  100-25 MCG/ACT AEPB, TAKE 1 PUFF BY MOUTH EVERY DAY, Disp: 60 each, Rfl: 4   brexpiprazole  (REXULTI ) 0.25 MG TABS tablet, Take 1 tablet (0.25 mg total) by mouth daily., Disp: 30 tablet, Rfl: 1   buPROPion  ER (WELLBUTRIN  SR) 100 MG 12 hr tablet, Take 1 tablet (100 mg total) by mouth daily with breakfast., Disp: 90 tablet, Rfl: 1   cyclobenzaprine (FLEXERIL) 10 MG tablet, Take 10 mg by mouth 3 (three) times daily as needed for muscle spasms., Disp: , Rfl:    DULoxetine  (CYMBALTA ) 20 MG capsule, Take 1 capsule (20 mg total) by mouth daily. Take along with 60 mg daily , total of 80 mg, Disp: 90 capsule, Rfl: 1   DULoxetine  (CYMBALTA ) 60 MG capsule, TAKE 1 CAPSULE BY MOUTH EVERY DAY, take along with 20 mg daily, Disp: 90 capsule, Rfl: 1   gabapentin  (NEURONTIN ) 300 MG capsule, Take one pill (300mg ) every morning and 2 pills (600mg ) in the evening, Disp: , Rfl:    meloxicam  (MOBIC ) 7.5 MG tablet, TAKE 1 TABLET BY MOUTH EVERY DAY, Disp: 30 tablet, Rfl: 3   omeprazole  (PRILOSEC) 40 MG capsule, Take 1 capsule (40 mg total) by mouth daily., Disp: 90 capsule, Rfl:  3   propranolol  ER (INDERAL  LA) 60 MG 24 hr capsule, Take 60 mg by mouth daily. (Patient taking differently: Take 60 mg by mouth 2 (two) times daily.), Disp: , Rfl:    rosuvastatin  (CRESTOR ) 20 MG tablet, TAKE 1 TABLET BY MOUTH EVERY DAY, Disp: 90 tablet, Rfl: 3   topiramate  (TOPAMAX ) 100 MG tablet, Take 100 mg by mouth daily., Disp: , Rfl:    traZODone  (DESYREL ) 50 MG tablet, Take 1-2 tablets (50-100 mg total) by mouth at bedtime as needed for sleep., Disp: 60 tablet, Rfl: 1   Ubrogepant 100 MG TABS, Take by mouth., Disp: , Rfl:    varenicline  (CHANTIX ) 1 MG tablet, TAKE 1 TABLET BY MOUTH TWICE A DAY, Disp: 180 tablet, Rfl: 0   clindamycin (CLEOCIN) 300 MG capsule, Take 300 mg by mouth 4 (four) times daily. (Patient not taking:  Reported on 06/16/2024), Disp: , Rfl:    clonazePAM  (KLONOPIN ) 0.5 MG tablet, Take 0.5-1 tablets (0.25-0.5 mg total) by mouth daily as needed for anxiety. Should last 30 days , please limit use (Patient not taking: Reported on 06/16/2024), Disp: 18 tablet, Rfl: 0  Patient Active Problem List   Diagnosis Date Noted   Well woman exam 08/05/2023   MDD (major depressive disorder), recurrent, in full remission (HCC) 08/14/2022   Hot flashes 03/29/2022   Shortness of breath 09/25/2021   Severe episode of recurrent major depressive disorder, without psychotic features (HCC) 09/13/2021   EKG abnormalities 09/10/2021   Insomnia due to medical condition 09/05/2021   At risk for prolonged QT interval syndrome 09/05/2021   Prediabetes 03/30/2021   Chronic pain of left knee 03/30/2021   MDD (major depressive disorder), recurrent episode, moderate (HCC) 11/13/2020   Essential hypertension 10/23/2020   Other specified diabetes mellitus with other specified complication (HCC) 10/23/2020   Recurrent major depressive disorder, in full remission (HCC) 07/25/2020   Tick bite 07/10/2020   Blurred vision 07/10/2020   Lethargy 07/10/2020   High risk medication use 04/06/2020   Panic disorder 07/28/2019   Bereavement 07/28/2019   MDD (major depressive disorder), recurrent episode, with atypical features (HCC) 07/28/2019   Agoraphobia 07/28/2019   Insomnia due to other mental disorder 07/28/2019   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   Tobacco use disorder 12/16/2017   Anxiety 06/25/2015   Carpal tunnel syndrome 06/25/2015   Family planning 06/25/2015   Bloodgood disease 06/25/2015   Fibroid 06/25/2015   Acid reflux 06/25/2015   Excess, menstruation 06/25/2015   Obesity (BMI 30-39.9) 06/25/2015   Class 3 severe obesity due to excess calories with serious comorbidity and body mass index (BMI) of 40.0 to 44.9 in adult 06/25/2015   Dysmenorrhea 06/25/2015   Disease of thyroid  gland 06/25/2015   Compulsive  tobacco user syndrome 06/25/2015   Episodic tension type headache 08/23/2014   Allergic rhinitis 07/23/2013   Wheezing 07/23/2013   Lower urinary tract infectious disease 03/11/2013   Hematuria 03/11/2013   Diarrhea 01/13/2013   Vomiting 01/13/2013   Amenorrhea 12/22/2012   HLD (hyperlipidemia) 08/31/2011    Past Surgical History:  Procedure Laterality Date   BREAST BIOPSY Left    neg   carpel tunnel Left 2017   CESAREAN SECTION     times 2   CHOLECYSTECTOMY     THYROID  LOBECTOMY      Family History  Problem Relation Age of Onset   Osteoporosis Mother    Breast cancer Mother 33   Diabetes Father    Anxiety disorder Father  Depression Father    Post-traumatic stress disorder Father    Heart disease Father    Lung cancer Father    Anxiety disorder Sister    Alcohol abuse Brother    Colon cancer Neg Hx    Ovarian cancer Neg Hx     Social History   Tobacco Use   Smoking status: Every Day    Types: Cigarettes    Start date: 05/22/1986   Smokeless tobacco: Never   Tobacco comments:    Currently 1-2 cig per day - cut back- 03/25/23  Vaping Use   Vaping status: Never Used  Substance Use Topics   Alcohol use: No    Alcohol/week: 0.0 standard drinks of alcohol   Drug use: No     Allergies  Allergen Reactions   Azithromycin Other (See Comments)    headache   Codeine Other (See Comments)    unknown   Penicillins Other (See Comments)    headaches    Health Maintenance  Topic Date Due   FOOT EXAM  Never done   HIV Screening  Never done   Diabetic kidney evaluation - Urine ACR  Never done   Hepatitis C Screening  Never done   Diabetic kidney evaluation - eGFR measurement  05/18/2018   HEMOGLOBIN A1C  02/02/2023   Medicare Annual Wellness (AWV)  11/19/2023   OPHTHALMOLOGY EXAM  06/16/2024 (Originally 01/14/1983)   COVID-19 Vaccine (1) 07/02/2024 (Originally 01/14/1978)   Zoster Vaccines- Shingrix (1 of 2) 09/16/2024 (Originally 01/15/1992)   Lung Cancer  Screening  06/16/2025 (Originally 01/14/2023)   Pneumococcal Vaccine 45-59 Years old (1 of 2 - PCV) 06/16/2025 (Originally 01/15/1992)   Colonoscopy  06/16/2025 (Originally 01/14/2018)   INFLUENZA VACCINE  07/30/2024   MAMMOGRAM  08/13/2025   Cervical Cancer Screening (HPV/Pap Cotest)  06/07/2027   DTaP/Tdap/Td (4 - Td or Tdap) 08/07/2029   HPV VACCINES  Aged Out   Meningococcal B Vaccine  Aged Out    Chart Review Today: ***  Review of Systems   Objective:   Vitals:   06/16/24 1320  BP: 120/68  Pulse: 77  Resp: 16  SpO2: 96%  Weight: 236 lb (107 kg)  Height: 5' 6 (1.676 m)    Body mass index is 38.09 kg/m.  Physical Exam   Functional Status Survey: Is the patient deaf or have difficulty hearing?: No Does the patient have difficulty seeing, even when wearing glasses/contacts?: Yes Does the patient have difficulty concentrating, remembering, or making decisions?: No Does the patient have difficulty walking or climbing stairs?: No Does the patient have difficulty dressing or bathing?: No Does the patient have difficulty doing errands alone such as visiting a doctor's office or shopping?: No Results for orders placed or performed during the hospital encounter of 04/02/24  Urine drugs of abuse scrn w alc, routine (Ref Lab)   Collection Time: 04/02/24 11:20 AM  Result Value Ref Range   Amphetamines, Urine Negative Cutoff=1000 ng/mL   Barbiturate, Ur Negative Cutoff=300 ng/mL   Benzodiazepine Quant, Ur Negative Cutoff=300 ng/mL   Cannabinoid Quant, Ur Negative Cutoff=50 ng/mL   Cocaine (Metab.) Negative Cutoff=300 ng/mL   Opiate Quant, Ur Negative Cutoff=300 ng/mL   Phencyclidine, Ur Negative Cutoff=25 ng/mL   Methadone Screen, Urine Negative Cutoff=300 ng/mL   Propoxyphene, Urine Negative Cutoff=300 ng/mL   Ethanol U, Quan Negative Cutoff=0.020 %      Assessment & Plan:   Encounter for medical examination to establish care  Prediabetes  Essential  hypertension  Hyperlipidemia, unspecified hyperlipidemia  type  MDD (major depressive disorder), recurrent, in full remission (HCC)  Screening for viral disease  Colon cancer screening     No follow-ups on file.   Adeline Hone, PA-C 06/16/24 1:53 PM

## 2024-06-16 NOTE — Telephone Encounter (Signed)
 Please call to schedule mwv

## 2024-06-17 ENCOUNTER — Ambulatory Visit: Payer: Self-pay | Admitting: Family Medicine

## 2024-06-18 LAB — CBC WITH DIFFERENTIAL/PLATELET
Absolute Lymphocytes: 3360 {cells}/uL (ref 850–3900)
Absolute Monocytes: 660 {cells}/uL (ref 200–950)
Basophils Absolute: 48 {cells}/uL (ref 0–200)
Basophils Relative: 0.4 %
Eosinophils Absolute: 912 {cells}/uL — ABNORMAL HIGH (ref 15–500)
Eosinophils Relative: 7.6 %
HCT: 46.1 % — ABNORMAL HIGH (ref 35.0–45.0)
Hemoglobin: 14.6 g/dL (ref 11.7–15.5)
MCH: 28.3 pg (ref 27.0–33.0)
MCHC: 31.7 g/dL — ABNORMAL LOW (ref 32.0–36.0)
MCV: 89.3 fL (ref 80.0–100.0)
MPV: 10.1 fL (ref 7.5–12.5)
Monocytes Relative: 5.5 %
Neutro Abs: 7020 {cells}/uL (ref 1500–7800)
Neutrophils Relative %: 58.5 %
Platelets: 287 10*3/uL (ref 140–400)
RBC: 5.16 10*6/uL — ABNORMAL HIGH (ref 3.80–5.10)
RDW: 13.1 % (ref 11.0–15.0)
Total Lymphocyte: 28 %
WBC: 12 10*3/uL — ABNORMAL HIGH (ref 3.8–10.8)

## 2024-06-18 LAB — HEMOGLOBIN A1C
Hgb A1c MFr Bld: 5.3 % (ref <5.7)
Mean Plasma Glucose: 105 mg/dL
eAG (mmol/L): 5.8 mmol/L

## 2024-06-18 LAB — COMPREHENSIVE METABOLIC PANEL WITH GFR
AG Ratio: 1.6 (calc) (ref 1.0–2.5)
ALT: 16 U/L (ref 6–29)
AST: 13 U/L (ref 10–35)
Albumin: 4.2 g/dL (ref 3.6–5.1)
Alkaline phosphatase (APISO): 77 U/L (ref 37–153)
BUN: 10 mg/dL (ref 7–25)
CO2: 28 mmol/L (ref 20–32)
Calcium: 9.9 mg/dL (ref 8.6–10.4)
Chloride: 107 mmol/L (ref 98–110)
Creat: 0.88 mg/dL (ref 0.50–1.03)
Globulin: 2.6 g/dL (ref 1.9–3.7)
Glucose, Bld: 81 mg/dL (ref 65–99)
Potassium: 4.5 mmol/L (ref 3.5–5.3)
Sodium: 143 mmol/L (ref 135–146)
Total Bilirubin: 0.3 mg/dL (ref 0.2–1.2)
Total Protein: 6.8 g/dL (ref 6.1–8.1)
eGFR: 80 mL/min/{1.73_m2} (ref >=60)

## 2024-06-18 LAB — LIPID PANEL
Cholesterol: 149 mg/dL (ref <200)
HDL: 28 mg/dL — ABNORMAL LOW (ref >=50)
LDL Cholesterol (Calc): 84 mg/dL
Non-HDL Cholesterol (Calc): 121 mg/dL (ref <130)
Total CHOL/HDL Ratio: 5.3 (calc) — ABNORMAL HIGH (ref <5.0)
Triglycerides: 278 mg/dL — ABNORMAL HIGH (ref <150)

## 2024-06-18 LAB — TSH: TSH: 1.16 m[IU]/L

## 2024-06-18 LAB — CELIAC DISEASE PANEL
(tTG) Ab, IgA: <1 U/mL
(tTG) Ab, IgG: <1 U/mL
Gliadin IgA: 1.4 U/mL
Gliadin IgG: <1 U/mL
Immunoglobulin A: 348 mg/dL — ABNORMAL HIGH (ref 47–310)

## 2024-06-18 LAB — MICROALBUMIN / CREATININE URINE RATIO
Creatinine, Urine: 156 mg/dL (ref 20–275)
Microalb Creat Ratio: 6 mg/g{creat} (ref <30)
Microalb, Ur: 1 mg/dL

## 2024-06-18 LAB — HIV ANTIBODY (ROUTINE TESTING W REFLEX): HIV 1&2 Ab, 4th Generation: NONREACTIVE

## 2024-06-18 LAB — HEPATITIS C ANTIBODY: Hepatitis C Ab: NONREACTIVE

## 2024-06-21 ENCOUNTER — Ambulatory Visit (INDEPENDENT_AMBULATORY_CARE_PROVIDER_SITE_OTHER): Admitting: Professional Counselor

## 2024-06-21 DIAGNOSIS — F1520 Other stimulant dependence, uncomplicated: Secondary | ICD-10-CM | POA: Insufficient documentation

## 2024-06-21 DIAGNOSIS — F331 Major depressive disorder, recurrent, moderate: Secondary | ICD-10-CM | POA: Diagnosis not present

## 2024-06-21 DIAGNOSIS — F4000 Agoraphobia, unspecified: Secondary | ICD-10-CM

## 2024-06-21 DIAGNOSIS — F410 Panic disorder [episodic paroxysmal anxiety] without agoraphobia: Secondary | ICD-10-CM | POA: Diagnosis not present

## 2024-06-21 MED ORDER — PROPRANOLOL HCL ER 120 MG PO CP24: 120.0000 mg | ORAL_CAPSULE | Freq: Every day | ORAL | 1 refills | Status: DC

## 2024-06-21 MED ORDER — OMEPRAZOLE 40 MG PO CPDR: 40.0000 mg | DELAYED_RELEASE_CAPSULE | Freq: Every day | ORAL | 3 refills | Status: DC

## 2024-06-21 MED ORDER — ROSUVASTATIN CALCIUM 20 MG PO TABS: 20.0000 mg | ORAL_TABLET | Freq: Every day | ORAL | 3 refills | Status: AC

## 2024-06-21 NOTE — Progress Notes (Signed)
  THERAPIST PROGRESS NOTE  Session Time: 2:20 PM - 2:50 PM   Participation Level: Active  Behavioral Response: Casual, Alert, Dysphoric  Type of Therapy: Individual Therapy  Treatment Goals addressed: Active OP Anxiety  LTG: I want to be able to go out in public, take my yougins places. Anxiety and panic attacks.    Start:  06/21/24    Expected End:  06/20/25     STG: To reduce symptoms of anxiety AEB reduction in GAD7 scores by using coping mechanisms over the next 90 days.    STG: I want to go grocery shopping without worrying about someone staring or judging you. To increase daily activities AEB restructuring maladaptive patterns of thinking and exposure exercises over the next 90 days.    STG: Well you can't bring my brother and my daddy back. To reduce impact of grief/loss AEB movement through stages of grief and coming to a place of acceptance over the next 12 weeks.    ProgressTowards Goals: Initial  Interventions: CBT, Motivational Interviewing, Supportive, and Other: Coping skills  Summary: Mary Koch is a 53 y.o. female who presents with a history of anxiety and panic disorder and depression. She appeared somber but oriented x5. She stated things are basically the same since her initial CCA. She engaged in developing her treatment plan. She actively listened to coping skills and engaged in 5-4-3-2-1 grounding mechanism. [name] actively engaged in guided meditation and expressed it seemed calming and helpful. She was in agreement to practice skills between now and next session.   Therapist Response: Conducted session with [name]. Began session with check-in/update since previous session. Utilized empathetic and reflective listening. Developed treatment plan with input from [name] on current strengths, needs, and progress towards goals. Provided psychoeducation on cognitive model, coping skills (breathing exercises, TIP) and engaged [name] in 5-4-3-2-1 grounding mechanism.  Also engaged in a guided meditation and encouraged [name] to try these for mindfulness and sleep hygiene. Scheduled additional appointment and concluded session.   Suicidal/Homicidal: No  Plan: Return again in 2 weeks.  Diagnosis: Panic disorder  MDD (major depressive disorder), recurrent episode, moderate (HCC)  Agoraphobia  Collaboration of Care: Medication Management AEB chart review  Patient/Guardian was advised Release of Information must be obtained prior to any record release in order to collaborate their care with an outside provider. Patient/Guardian was advised if they have not already done so to contact the registration department to sign all necessary forms in order for us  to release information regarding their care.   Consent: Patient/Guardian gives verbal consent for treatment and assignment of benefits for services provided during this visit. Patient/Guardian expressed understanding and agreed to proceed.   Mary Koch, [facility] 06/21/2024

## 2024-06-21 NOTE — Assessment & Plan Note (Signed)
Per psychiatry 

## 2024-06-21 NOTE — Assessment & Plan Note (Signed)
 Last A1c per pt report was 6.0 Will recheck labs today

## 2024-06-21 NOTE — Assessment & Plan Note (Signed)
 She reports chronic diarrhea since cholecystectomy

## 2024-06-21 NOTE — Assessment & Plan Note (Signed)
 With associated HTN, HLD, prediabetes

## 2024-06-21 NOTE — Assessment & Plan Note (Signed)
 On statin, which she tolerates Will recheck labs and refill meds if lipids appear well controlled with current med and dose

## 2024-06-24 ENCOUNTER — Ambulatory Visit

## 2024-06-24 DIAGNOSIS — Z0000 Encounter for general adult medical examination without abnormal findings: Secondary | ICD-10-CM

## 2024-06-24 DIAGNOSIS — Z1231 Encounter for screening mammogram for malignant neoplasm of breast: Secondary | ICD-10-CM

## 2024-06-24 NOTE — Patient Instructions (Addendum)
 Mary Koch , Thank you for taking time out of your busy schedule to complete your Annual Wellness Visit with me. I enjoyed our conversation and look forward to speaking with you again next year. I, as well as your care team,  appreciate your ongoing commitment to your health goals. Please review the following plan we discussed and let me know if I can assist you in the future.  Mammogram ordered You have an order for:  []   2D Mammogram  [x]   3D Mammogram  []   Bone Density     Please call for appointment:  [facility]  [address] [name] [address] [phone] [facility] [address] [name], [name] [address] [phone] [facility] [address] [name], [name] [address] [phone]   Make sure to wear two-piece clothing.  No lotions, powders, or deodorants the day of the appointment. Make sure to bring picture ID and insurance card.  Bring list of medications you are currently taking including any supplements.   Schedule your [place] screening mammogram through MyChart!   Log into your MyChart account.  Go to 'Visit' (or 'Appointments' if on mobile App) --> Schedule an Appointment  Under 'Select a Reason for Visit' choose the Mammogram Screening option.  Complete the pre-visit questions and select the time and place that best fits your schedule.   Follow up Visits: Next Medicare AWV with our clinical staff:   07/07/25 @ 10:50 AM BY PHONE Have you seen your provider in the last 6 months (3 months if uncontrolled diabetes)? Yes   Clinician Recommendations:  Aim for 30 minutes of exercise or brisk walking, 6-8 glasses of water, and 5 servings of fruits and vegetables each day. Take care!      This is a list of the screening recommended for you and due dates:  Health Maintenance  Topic Date Due   Hepatitis B Vaccine (1 of 3 - 19+  3-dose series) Never done   COVID-19 Vaccine (1) 07/02/2024*   Zoster (Shingles) Vaccine (1 of 2) 09/16/2024*   Screening for Lung Cancer  06/16/2025*   Pneumococcal Vaccination (1 of 2 - PCV) 06/16/2025*   Colon Cancer Screening  06/16/2025*   Flu Shot  07/30/2024   Mammogram  08/13/2024   Medicare Annual Wellness Visit  06/24/2025   Pap with HPV screening  06/07/2027   DTaP/Tdap/Td vaccine (4 - Td or Tdap) 08/07/2029   Hepatitis C Screening  Completed   HIV Screening  Completed   HPV Vaccine  Aged Out   Meningitis B Vaccine  Aged Out  *Topic was postponed. The date shown is not the original due date.    Advanced directives: (ACP Link)Information on Advanced Care Planning can be found at [place]  Secretary of [facility] Advance Health Care Directives Advance Health Care Directives. [email or web address]  Advance Care Planning is important because it:  [x]  Makes sure you receive the medical care that is consistent with your values, goals, and preferences  [x]  It provides guidance to your family and loved ones and reduces their decisional burden about whether or not they are making the right decisions based on your wishes.  Follow the link provided in your after visit summary or read over the paperwork we have mailed to you to help you started getting your Advance Directives in place. If you need assistance in completing these, please reach out to  us  so that we can help you!

## 2024-06-24 NOTE — Progress Notes (Signed)
 Subjective:   Mary Koch is a 53 y.o. who presents for a Medicare Wellness preventive visit.  As a reminder, Annual Wellness Visits don't include a physical exam, and some assessments may be limited, especially if this visit is performed virtually. We may recommend an in-person follow-up visit with your provider if needed.  Visit Complete: Virtual I connected with  [name] on 06/24/24 by a audio enabled telemedicine application and verified that I am speaking with the correct person using two identifiers.  Patient Location: Home  Provider Location: Home Office  I discussed the limitations of evaluation and management by telemedicine. The patient expressed understanding and agreed to proceed.  Vital Signs: Because this visit was a virtual/telehealth visit, some criteria may be missing or patient reported. Any vitals not documented were not able to be obtained and vitals that have been documented are patient reported.  VideoDeclined- This patient declined Librarian, academic. Therefore the visit was completed with audio only.  Persons Participating in Visit: Patient.  AWV Questionnaire: No: Patient Medicare AWV questionnaire was not completed prior to this visit.  Cardiac Risk Factors include: advanced age (>42men, >31 women);dyslipidemia;hypertension;obesity (BMI >30kg/m2);smoking/ tobacco exposure     Objective:    There were no vitals filed for this visit. There is no height or weight on file to calculate BMI.     06/24/2024   11:36 AM 10/14/2023    3:16 PM 11/18/2022    1:45 PM 07/09/2022   11:11 AM 09/27/2021   11:01 AM 12/16/2018    5:00 PM 10/21/2017    1:19 PM  Advanced Directives  Does Patient Have a Medical Advance Directive? No No No No No    Would patient like information on creating a medical advance directive? No - Patient declined No - Patient declined No - Patient declined No - Patient declined No - Patient declined        Information is confidential and restricted. Go to Review Flowsheets to unlock data.    Current Medications (verified) Outpatient Encounter Medications as of 06/24/2024  Medication Sig   albuterol  (PROVENTIL ) (2.5 MG/3ML) 0.083% nebulizer solution INHALE 3 ML BY NEBULIZATION EVERY 6 HOURS AS NEEDED FOR WHEEZING OR SHORTNESS OF BREATH   albuterol  (VENTOLIN  HFA) 108 (90 Base) MCG/ACT inhaler INHALE 1 TO 2 PUFFS BY MOUTH DAILY   BREO ELLIPTA  100-25 MCG/ACT AEPB TAKE 1 PUFF BY MOUTH EVERY DAY   brexpiprazole  (REXULTI ) 0.25 MG TABS tablet Take 1 tablet (0.25 mg total) by mouth daily.   buPROPion  ER (WELLBUTRIN  SR) 100 MG 12 hr tablet Take 1 tablet (100 mg total) by mouth daily with breakfast.   cyclobenzaprine (FLEXERIL) 10 MG tablet Take 10 mg by mouth 3 (three) times daily as needed for muscle spasms.   DULoxetine  (CYMBALTA ) 20 MG capsule Take 1 capsule (20 mg total) by mouth daily. Take along with 60 mg daily , total of 80 mg   DULoxetine  (CYMBALTA ) 60 MG capsule TAKE 1 CAPSULE BY MOUTH EVERY DAY, take along with 20 mg daily   gabapentin  (NEURONTIN ) 300 MG capsule Take one pill (300mg ) every morning and 2 pills (600mg ) in the evening   omeprazole  (PRILOSEC) 40 MG capsule Take 1 capsule (40 mg total) by mouth daily.   propranolol  ER (INDERAL  LA) 120 MG 24 hr capsule Take 1 capsule (120 mg total) by mouth daily.   rosuvastatin  (CRESTOR ) 20 MG tablet Take 1 tablet (20 mg total) by mouth daily.   topiramate  (TOPAMAX ) 100  MG tablet Take 100 mg by mouth daily.   traZODone  (DESYREL ) 50 MG tablet Take 1-2 tablets (50-100 mg total) by mouth at bedtime as needed for sleep.   Ubrogepant 100 MG TABS Take by mouth.   varenicline  (CHANTIX ) 1 MG tablet TAKE 1 TABLET BY MOUTH TWICE A DAY   meloxicam  (MOBIC ) 7.5 MG tablet TAKE 1 TABLET BY MOUTH EVERY DAY (Patient not taking: Reported on 06/24/2024)   No facility-administered encounter medications on file as of 06/24/2024.    Allergies  (verified) Azithromycin, Codeine, and Penicillins   History: Past Medical History:  Diagnosis Date   Allergic rhinitis    Anemia    Anxiety    Anxiety and depression    Carpal tunnel syndrome    COVID-19 01/15/2020   Depression    Fibroids    GERD (gastroesophageal reflux disease)    Headache(784.0)    Headache(784.0)    Heavy periods    Hyperthyroidism    right portion of thyroid  removed   Muscle spasm    Neurological disorder    evaluation for ms   Painful menstrual periods    Shortness of breath    when i smoke a lot   Past Surgical History:  Procedure Laterality Date   BREAST BIOPSY Left    neg   carpel tunnel Left 2017   CESAREAN SECTION     times 2   CHOLECYSTECTOMY     THYROID  LOBECTOMY     Family History  Problem Relation Age of Onset   Osteoporosis Mother    Breast cancer Mother 36   Diabetes Father    Anxiety disorder Father    Depression Father    Post-traumatic stress disorder Father    Heart disease Father    Lung cancer Father    Anxiety disorder Sister    Alcohol abuse Brother    Colon cancer Neg Hx    Ovarian cancer Neg Hx    Social History   Socioeconomic History   Marital status: Legally Separated    Spouse name: Not on file   Number of children: 2   Years of education: Not on file   Highest education level: High school graduate  Occupational History   Not on file  Tobacco Use   Smoking status: Every Day    Types: Cigarettes    Start date: 05/22/1986   Smokeless tobacco: Never   Tobacco comments:    Currently 1-2 cig per day - cut back- 03/25/23  Vaping Use   Vaping status: Never Used  Substance and Sexual Activity   Alcohol use: No    Alcohol/week: 0.0 standard drinks of alcohol   Drug use: No   Sexual activity: Yes    Partners: Male    Birth control/protection: Surgical, Injection  Other Topics Concern   Not on file  Social History Narrative   Not on file   Social Drivers of Health   Financial Resource Strain: Low  Risk  (06/24/2024)   Overall Financial Resource Strain (CARDIA)    Difficulty of Paying Living Expenses: Not hard at all  Recent Concern: Financial Resource Strain - Medium Risk (05/11/2024)   Overall Financial Resource Strain (CARDIA)    Difficulty of Paying Living Expenses: Somewhat hard  Food Insecurity: No Food Insecurity (06/24/2024)   Hunger Vital Sign    Worried About Running Out of Food in the Last Year: Never true    Ran Out of Food in the Last Year: Never true  Transportation Needs: No Transportation  Needs (06/24/2024)   PRAPARE - Administrator, Civil Service (Medical): No    Lack of Transportation (Non-Medical): No  Physical Activity: Insufficiently Active (06/24/2024)   Exercise Vital Sign    Days of Exercise per Week: 3 days    Minutes of Exercise per Session: 10 min  Stress: No Stress Concern Present (06/24/2024)   [employer] of Occupational Health - Occupational Stress Questionnaire    Feeling of Stress: Only a little  Recent Concern: Stress - Stress Concern Present (05/11/2024)   [employer] of Occupational Health - Occupational Stress Questionnaire    Feeling of Stress : Very much  Social Connections: Socially Isolated (06/24/2024)   Social Connection and Isolation Panel    Frequency of Communication with Friends and Family: Twice a week    Frequency of Social Gatherings with Friends and Family: Never    Attends Religious Services: Never    Database administrator or Organizations: No    Attends Engineer, structural: Never    Marital Status: Separated    Tobacco Counseling Ready to quit: Not Answered Counseling given: Not Answered Tobacco comments: Currently 1-2 cig per day - cut back- 03/25/23    Clinical Intake:  Pre-visit preparation completed: Yes  Pain : No/denies pain     BMI - recorded: 38.1 Nutritional Status: BMI > 30  Obese Nutritional Risks: None Diabetes: No  Lab Results  Component Value Date   HGBA1C 5.3  06/16/2024   HGBA1C 5.0 08/02/2022   HGBA1C 5.2 03/30/2021     How often do you need to have someone help you when you read instructions, pamphlets, or other written materials from your doctor or pharmacy?: 1 - Never  Interpreter Needed?: No  Information entered by :: JHONNIE DAS, LPN   Activities of Daily Living    06/24/2024   11:37 AM 06/16/2024    1:19 PM  In your present state of health, do you have any difficulty performing the following activities:  Hearing? 0 0  Vision? 1 1  Difficulty concentrating or making decisions? 1 0  Walking or climbing stairs? 1 0  Comment SOMETIMES   Dressing or bathing? 0 0  Doing errands, shopping? 0 0  Preparing Food and eating ? N   Using the Toilet? N   In the past six months, have you accidently leaked urine? N   Do you have problems with loss of bowel control? N   Managing your Medications? N   Managing your Finances? N   Housekeeping or managing your Housekeeping? N     Patient Care Team: Tapia, Leisa, PA-C as PCP - General (Family Medicine)  I have updated your Care Teams any recent Medical Services you may have received from other providers in the past year.     Assessment:   This is a routine wellness examination for Rosette.  Hearing/Vision screen Hearing Screening - Comments:: NO AIDS Vision Screening - Comments:: WEARS GLASSES-    Goals Addressed             This Visit's Progress    DIET - INCREASE WATER INTAKE         Depression Screen     06/24/2024   11:34 AM 06/16/2024    1:30 PM 05/11/2024    1:10 PM 10/16/2023   11:59 AM 10/14/2023    3:25 PM 12/24/2022    8:38 AM 11/18/2022    1:42 PM  PHQ 2/9 Scores  PHQ - 2 Score 2 6  2  3  PHQ- 9 Score 4 18     3      Information is confidential and restricted. Go to Review Flowsheets to unlock data.    Fall Risk     06/24/2024   11:37 AM 06/16/2024    1:19 PM 02/10/2023    3:35 PM 11/18/2022    1:45 PM 02/26/2022   11:17 AM  Fall Risk   Falls in the  past year? 1 0 0 0 0  Number falls in past yr: 0 0 0 0 0  Injury with Fall? 0 0 0 0 0  Risk for fall due to :  No Fall Risks No Fall Risks No Fall Risks No Fall Risks  Follow up Falls evaluation completed;Falls prevention discussed Falls prevention discussed Falls evaluation completed Falls prevention discussed;Falls evaluation completed  Falls evaluation completed      Data saved with a previous flowsheet row definition    MEDICARE RISK AT HOME:  Medicare Risk at Home Any stairs in or around the home?: Yes If so, are there any without handrails?: No Home free of loose throw rugs in walkways, pet beds, electrical cords, etc?: Yes Adequate lighting in your home to reduce risk of falls?: Yes Life alert?: No Use of a cane, walker or w/c?: No Grab bars in the bathroom?: No Shower chair or bench in shower?: No Elevated toilet seat or a handicapped toilet?: No  TIMED UP AND GO:  Was the test performed?  No  Cognitive Function: 6CIT completed    09/27/2021   11:03 AM 09/27/2021   10:54 AM  MMSE - Mini Mental State Exam  Orientation to time 5 5  Orientation to Place 5 5  Registration 3 3  Attention/ Calculation 5 5  Recall 3 3  Language- name 2 objects 2 2  Language- repeat 1 1  Language- follow 3 step command 3 3  Language- read & follow direction 1 1  Write a sentence 1 1  Copy design 0 0  Total score 29 29        06/24/2024   11:39 AM 11/18/2022    1:46 PM 09/27/2021   11:03 AM  6CIT Screen  What Year? 0 points 0 points 0 points  What month? 0 points 0 points 0 points  What time? 0 points 0 points 0 points  Count back from 20 0 points 0 points 0 points  Months in reverse 0 points 0 points 0 points  Repeat phrase 2 points 0 points 0 points  Total Score 2 points 0 points 0 points    Immunizations Immunization History  Administered Date(s) Administered   Influenza, Quadrivalent, Recombinant, Inj, Pf 11/01/2019   Influenza,inj,Quad PF,6+ Mos 10/08/2017, 09/10/2019    Td 04/24/1989   Td (Adult),5 Lf Tetanus Toxid, Preservative Free 12/19/2004   Tdap 08/08/2019    Screening Tests Health Maintenance  Topic Date Due   Hepatitis B Vaccines (1 of 3 - 19+ 3-dose series) Never done   COVID-19 Vaccine (1) 07/02/2024 (Originally 01/14/1976)   Zoster Vaccines- Shingrix (1 of 2) 09/16/2024 (Originally 01/15/1992)   Lung Cancer Screening  06/16/2025 (Originally 01/14/2023)   Pneumococcal Vaccine 70-71 Years old (1 of 2 - PCV) 06/16/2025 (Originally 01/15/1992)   Colonoscopy  06/16/2025 (Originally 01/14/2018)   INFLUENZA VACCINE  07/30/2024   MAMMOGRAM  08/13/2024   Medicare Annual Wellness (AWV)  06/24/2025   Cervical Cancer Screening (HPV/Pap Cotest)  06/07/2027   DTaP/Tdap/Td (4 - Td or Tdap) 08/07/2029  Hepatitis C Screening  Completed   HIV Screening  Completed   HPV VACCINES  Aged Out   Meningococcal B Vaccine  Aged Out    Health Maintenance  Health Maintenance Due  Topic Date Due   Hepatitis B Vaccines (1 of 3 - 19+ 3-dose series) Never done   Health Maintenance Items Addressed: Mammogram ordered; UP TO DATE ON TDAP; NEEDS PNA, COVID & SHINGRIX  Additional Screening:  Vision Screening: Recommended annual ophthalmology exams for early detection of glaucoma and other disorders of the eye. Would you like a referral to an eye doctor? No    Dental Screening: Recommended annual dental exams for proper oral hygiene  Community Resource Referral / Chronic Care Management: CRR required this visit?  No   CCM required this visit?  No   Plan:    I have personally reviewed and noted the following in the patient's chart:   Medical and social history Use of alcohol, tobacco or illicit drugs  Current medications and supplements including opioid prescriptions. Patient is not currently taking opioid prescriptions. Functional ability and status Nutritional status Physical activity Advanced directives List of other physicians Hospitalizations,  surgeries, and ER visits in previous 12 months Vitals Screenings to include cognitive, depression, and falls Referrals and appointments  In addition, I have reviewed and discussed with patient certain preventive protocols, quality metrics, and best practice recommendations. A written personalized care plan for preventive services as well as general preventive health recommendations were provided to patient.   Jhonnie GORMAN Das, LPN   [id number]   After Visit Summary: (MyChart) Due to this being a telephonic visit, the after visit summary with patients personalized plan was offered to patient via MyChart   Notes: MAMMOGRAM ORDERED

## 2024-07-03 ENCOUNTER — Other Ambulatory Visit: Payer: Self-pay | Admitting: Psychiatry

## 2024-07-03 DIAGNOSIS — F410 Panic disorder [episodic paroxysmal anxiety] without agoraphobia: Secondary | ICD-10-CM

## 2024-07-05 ENCOUNTER — Ambulatory Visit (INDEPENDENT_AMBULATORY_CARE_PROVIDER_SITE_OTHER): Admitting: Professional Counselor

## 2024-07-05 DIAGNOSIS — F410 Panic disorder [episodic paroxysmal anxiety] without agoraphobia: Secondary | ICD-10-CM

## 2024-07-05 DIAGNOSIS — F4000 Agoraphobia, unspecified: Secondary | ICD-10-CM | POA: Diagnosis not present

## 2024-07-05 DIAGNOSIS — F331 Major depressive disorder, recurrent, moderate: Secondary | ICD-10-CM

## 2024-07-05 NOTE — Progress Notes (Signed)
  THERAPIST PROGRESS NOTE  Session Time: 2:00 PM - 2:50 PM   Participation Level: Active  Behavioral Response: Casual, Alert, Dysphoric  Type of Therapy: Individual Therapy  Treatment Goals addressed: Active OP Anxiety  LTG: I want to be able to go out in public, take my yougins places. Anxiety and panic attacks.                Start:  06/21/24    Expected End:  06/20/25      STG: To reduce symptoms of anxiety AEB reduction in GAD7 scores by using coping mechanisms over the next 90 days.     STG: I want to go grocery shopping without worrying about someone staring or judging you. To increase daily activities AEB restructuring maladaptive patterns of thinking and exposure exercises over the next 90 days.     STG: Well you can't bring my brother and my daddy back. To reduce impact of grief/loss AEB movement through stages of grief and coming to a place of acceptance over the next 12 weeks.    ProgressTowards Goals: Progressing  Interventions: CBT, Motivational Interviewing, and Supportive  Summary: Mary Koch is a 53 y.o. female who presents with a history of anxiety, panic disorder, agoraphobia, and depression. She appeared dysphoric but oriented x5. She stated things are the same. She reported she has been practicing breathing exercises - smell the roses, blow out the candles. She reported it was helpful in a mid-day [employer] trip. Mary Koch engaged in writing exercise. She was able to identify things within and outside of her control. She was receptive to input from Conservation officer, nature. Mary Koch processed feelings of grief around her father and brother. She shared her struggle to adjust to her son being gay, which she learned a year ago. Mary Koch identified values in her relationship, family, and thoughts on marriage. She wants to be supportive of her son. She will practicing focusing on what's within her control.   Therapist Response: Conducted session with Mary Koch. Began session with  check-in/update since previous session. Utilized empathetic and reflective listening. Used open-ended questions to facilitate discussion and summarized thoughts/feelings. Engaged Mary Koch in writing exercise - donut/circles of control. Assisted with identifying things within and outside of Mary Koch's control. Processed and normalized grief experiences. Explored Mary Koch's thoughts around her son's sexuality. Scheduled additional appointment and concluded session.   Suicidal/Homicidal: No  Plan: Return again in 2 weeks.  Diagnosis: MDD (major depressive disorder), recurrent episode, moderate (HCC)  Panic disorder  Agoraphobia  Collaboration of Care: Medication Management AEB chart review   Patient/Guardian was advised Release of Information must be obtained prior to any record release in order to collaborate their care with an outside provider. Patient/Guardian was advised if they have not already done so to contact the registration department to sign all necessary forms in order for us  to release information regarding their care.   Consent: Patient/Guardian gives verbal consent for treatment and assignment of benefits for services provided during this visit. Patient/Guardian expressed understanding and agreed to proceed.   Mary Koch, [facility] 07/05/2024

## 2024-07-19 ENCOUNTER — Ambulatory Visit (INDEPENDENT_AMBULATORY_CARE_PROVIDER_SITE_OTHER): Admitting: Professional Counselor

## 2024-07-19 DIAGNOSIS — F410 Panic disorder [episodic paroxysmal anxiety] without agoraphobia: Secondary | ICD-10-CM

## 2024-07-19 DIAGNOSIS — F331 Major depressive disorder, recurrent, moderate: Secondary | ICD-10-CM | POA: Diagnosis not present

## 2024-07-19 NOTE — Progress Notes (Signed)
  THERAPIST PROGRESS NOTE  Session Time: 2:00 PM - 2:37 PM  Participation Level: Active  Behavioral Response: Casual, Alert, Euthymic  Type of Therapy: Individual Therapy  Treatment Goals addressed:  Active OP Anxiety  LTG: I want to be able to go out in public, take my yougins places. Anxiety and panic attacks.                Start:  06/21/24    Expected End:  06/20/25      STG: To reduce symptoms of anxiety AEB reduction in GAD7 scores by using coping mechanisms over the next 90 days.     STG: I want to go grocery shopping without worrying about someone staring or judging you. To increase daily activities AEB restructuring maladaptive patterns of thinking and exposure exercises over the next 90 days.     STG: Well you can't bring my brother and my daddy back. To reduce impact of grief/loss AEB movement through stages of grief and coming to a place of acceptance over the next 12 weeks.    ProgressTowards Goals: Progressing  Interventions: CBT, Motivational Interviewing, and Supportive  Summary: Jazzie Lafaye Mcelmurry is a 53 y.o. female who presents with a history of anxiety, panic disorder, agoraphobia, and depression. She appeared alert and oriented x5. She stated her brother and sister-in-law gifted her a car; she just has to get tags and insurance. Mettie reported that was very nice of them. She engaged in discussion about her son's sexuality and thoughts about it. She engaged in writing exercise. She was able to identify her cognitive distortions and was able to restructure those thoughts with some assistance. Shanicka noted the difference in the emotions attached to different thoughts.   Therapist Response: Conducted session with [name]. Began session with check-in/update since previous session. Utilized empathetic and reflective listening. Used open-ended questions to facilitate discussion and summarized thoughts/feelings. Engaged Shonika in writing exercise, Cracking the NUTS and  eliminating the ANTS. Provided psychoeducation on cognitive distortions and assisted with restructuring negative thoughts. Encouraged Camrynn to pay attention to her thoughts and keep thought log for further analyzing. Scheduled additional appointment and concluded session.   Suicidal/Homicidal: No  Plan: Return again in 2 weeks.  Diagnosis: MDD (major depressive disorder), recurrent episode, moderate (HCC)  Panic disorder  Collaboration of Care: Medication Management AEB chart review  Patient/Guardian was advised Release of Information must be obtained prior to any record release in order to collaborate their care with an outside provider. Patient/Guardian was advised if they have not already done so to contact the registration department to sign all necessary forms in order for us  to release information regarding their care.   Consent: Patient/Guardian gives verbal consent for treatment and assignment of benefits for services provided during this visit. Patient/Guardian expressed understanding and agreed to proceed.   [name], [facility] 07/19/2024

## 2024-07-20 ENCOUNTER — Other Ambulatory Visit: Payer: Self-pay | Admitting: Psychiatry

## 2024-07-20 DIAGNOSIS — F17200 Nicotine dependence, unspecified, uncomplicated: Secondary | ICD-10-CM

## 2024-07-30 ENCOUNTER — Other Ambulatory Visit: Payer: Self-pay | Admitting: Psychiatry

## 2024-07-30 DIAGNOSIS — G4701 Insomnia due to medical condition: Secondary | ICD-10-CM

## 2024-07-30 DIAGNOSIS — F331 Major depressive disorder, recurrent, moderate: Secondary | ICD-10-CM

## 2024-08-03 ENCOUNTER — Ambulatory Visit (INDEPENDENT_AMBULATORY_CARE_PROVIDER_SITE_OTHER): Admitting: Professional Counselor

## 2024-08-03 DIAGNOSIS — F410 Panic disorder [episodic paroxysmal anxiety] without agoraphobia: Secondary | ICD-10-CM | POA: Diagnosis not present

## 2024-08-03 DIAGNOSIS — F4000 Agoraphobia, unspecified: Secondary | ICD-10-CM

## 2024-08-03 DIAGNOSIS — F331 Major depressive disorder, recurrent, moderate: Secondary | ICD-10-CM

## 2024-08-03 NOTE — Progress Notes (Signed)
  THERAPIST PROGRESS NOTE  Session Time: 1:00 PM - 1:54 PM   Participation Level: Active  Behavioral Response: Casual, Alert, Anxious and Dysphoric  Type of Therapy: Individual Therapy  Treatment Goals addressed: Active OP Anxiety  LTG: I want to be able to go out in public, take my yougins places. Anxiety and panic attacks.                Start:  06/21/24    Expected End:  06/20/25      STG: To reduce symptoms of anxiety AEB reduction in GAD7 scores by using coping mechanisms over the next 90 days.     STG: I want to go grocery shopping without worrying about someone staring or judging you. To increase daily activities AEB restructuring maladaptive patterns of thinking and exposure exercises over the next 90 days.     STG: Well you can't bring my brother and my daddy back. To reduce impact of grief/loss AEB movement through stages of grief and coming to a place of acceptance over the next 12 weeks.    ProgressTowards Goals: Progressing  Interventions: CBT, Motivational Interviewing, Solution Focused, Assertiveness Training, and Supportive  Summary: Mary Koch is a 53 y.o. female who presents with a history of anxiety, panic disorder, agoraphobia, and depression. She appeared alert and oriented x5. She discussed her previous relationships and current dynamics with her separated husband. [name] expressed her struggle with communicating with him and how she's rather not fight with him for things. However, she agreed that it's important to get support for her children. She actively listened to [facility] MAN skill but isn't sure she will be able to use it with him.   Therapist Response: Conducted session with [name]. Began session with check-in/update since previous session. Utilized empathetic and reflective listening. Used open-ended questions to facilitate discussion and summarized thoughts/feelings. Explained and modeled DEAR MAN skill for objective effectiveness. Scheduled additional  appointment and concluded session.   Suicidal/Homicidal: No  Plan: Return again in 3 weeks.  Diagnosis: MDD (major depressive disorder), recurrent episode, moderate (HCC)  Panic disorder  Agoraphobia  Collaboration of Care: Medication Management AEB chart review  Patient/Guardian was advised Release of Information must be obtained prior to any record release in order to collaborate their care with an outside provider. Patient/Guardian was advised if they have not already done so to contact the registration department to sign all necessary forms in order for us  to release information regarding their care.   Consent: Patient/Guardian gives verbal consent for treatment and assignment of benefits for services provided during this visit. Patient/Guardian expressed understanding and agreed to proceed.   [name], [facility] 08/03/2024

## 2024-08-12 DIAGNOSIS — Z1331 Encounter for screening for depression: Secondary | ICD-10-CM | POA: Diagnosis not present

## 2024-08-12 DIAGNOSIS — G43119 Migraine with aura, intractable, without status migrainosus: Secondary | ICD-10-CM | POA: Diagnosis not present

## 2024-08-12 DIAGNOSIS — G5603 Carpal tunnel syndrome, bilateral upper limbs: Secondary | ICD-10-CM | POA: Diagnosis not present

## 2024-08-16 ENCOUNTER — Encounter: Payer: Self-pay | Admitting: Psychiatry

## 2024-08-16 ENCOUNTER — Telehealth (INDEPENDENT_AMBULATORY_CARE_PROVIDER_SITE_OTHER): Admitting: Psychiatry

## 2024-08-16 DIAGNOSIS — F17200 Nicotine dependence, unspecified, uncomplicated: Secondary | ICD-10-CM

## 2024-08-16 DIAGNOSIS — F4000 Agoraphobia, unspecified: Secondary | ICD-10-CM | POA: Diagnosis not present

## 2024-08-16 DIAGNOSIS — G4701 Insomnia due to medical condition: Secondary | ICD-10-CM | POA: Diagnosis not present

## 2024-08-16 DIAGNOSIS — F3341 Major depressive disorder, recurrent, in partial remission: Secondary | ICD-10-CM | POA: Diagnosis not present

## 2024-08-16 DIAGNOSIS — F1590 Other stimulant use, unspecified, uncomplicated: Secondary | ICD-10-CM

## 2024-08-16 DIAGNOSIS — F410 Panic disorder [episodic paroxysmal anxiety] without agoraphobia: Secondary | ICD-10-CM

## 2024-08-16 NOTE — Progress Notes (Signed)
 [facility] MD OP Progress Note Virtual Visit via Video Note  I connected with Mary Koch on 08/16/24 at  1:20 PM EDT by a video enabled telemedicine application and verified that I am speaking with the correct person using two identifiers.  Location Provider Location : [facility] Patient Location : Home  Participants: Patient , Provider   I discussed the limitations of evaluation and management by telemedicine and the availability of in person appointments. The patient expressed understanding and agreed to proceed.   I discussed the assessment and treatment plan with the patient. The patient was provided an opportunity to ask questions and all were answered. The patient agreed with the plan and demonstrated an understanding of the instructions.   The patient was advised to call back or seek an in-person evaluation if the symptoms worsen or if the condition fails to improve as anticipated.   08/16/2024 1:46 PM [name]  MRN:  [id number]  Chief Complaint:  Chief Complaint  Patient presents with   Follow-up   Depression   Anxiety   Medication Refill   Discussed the use of AI scribe software for clinical note transcription with the patient, who gave verbal consent to proceed.  History of Present Illness Mary Koch is a 53 year old Caucasian female lives in [place], has a history of MDD, panic disorder, agoraphobia, insomnia, tobacco use disorder, caffeine use disorder was evaluated by telemedicine today.  Ongoing grief related to the loss of her father nearly 6 years ago, her brother in May 2025, and the recent loss of a family dog last week continues to affect her. She describes missing her father and brother and continues to process these losses. She states that she continues to see her therapist, with her most recent session occurring at the beginning of August and she has scheduled another for later in the month.  Sleep has improved since she added Rosabella Moringa, a  supplement she learned about online, to her regimen. She now falls asleep faster and wakes up consistently at 7:30 AM. She also takes trazodone  at a higher dose and confirms she continues this. Her current medication regimen includes Wellbutrin  100 mg, Cymbalta  80 mg, Rexulti , and trazodone . She also takes gabapentin  300 mg in the morning and 600 mg at night.  She denies suicidal ideation and denies thoughts of harming herself or others when asked directly. She reports that she has not needed to use clonazepam  except for 1 occasion since her last refill.  She reports current tobacco use, smoking approximately 12 to 13 cigarettes per day. She states this is a significant reduction compared to previous use.    Visit Diagnosis:    ICD-10-CM   1. MDD (major depressive disorder), recurrent, in partial remission (HCC)  F33.41     2. Panic disorder  F41.0     3. Agoraphobia  F40.00     4. Insomnia due to medical condition  G47.01    Multifactorial including lack of sleep hygiene, pain, mood    5. Tobacco use disorder  F17.200    Severe    6. 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  Past trials of Paxil, Luvox, Klonopin , Zoloft, Seroquel , Wellbutrin , Ambien .  Past Medical History:  Past Medical History:  Diagnosis Date   Allergic rhinitis    Anemia    Anxiety    Anxiety and depression  Carpal tunnel syndrome    COVID-19 01/15/2020   Depression    Fibroids    GERD (gastroesophageal reflux disease)    Headache(784.0)    Headache(784.0)    Heavy periods    Hyperthyroidism    right portion of thyroid  removed   Muscle spasm    Neurological disorder    evaluation for ms   Painful menstrual periods    Shortness of breath    when i smoke a lot    Past Surgical History:  Procedure Laterality Date   BREAST BIOPSY Left    neg   carpel tunnel Left 2017   CESAREAN SECTION      times 2   CHOLECYSTECTOMY     THYROID  LOBECTOMY      Family Psychiatric History: I have reviewed family psychiatric history from progress note on 03/24/2018.  Family History:  Family History  Problem Relation Age of Onset   Osteoporosis Mother    Breast cancer Mother 20   Diabetes Father    Anxiety disorder Father    Depression Father    Post-traumatic stress disorder Father    Heart disease Father    Lung cancer Father    Anxiety disorder Sister    Alcohol abuse Brother    Colon cancer Neg Hx    Ovarian cancer Neg Hx     Social History: I have reviewed social history from progress note on 03/24/2018. Social History   Socioeconomic History   Marital status: Legally Separated    Spouse name: Not on file   Number of children: 2   Years of education: Not on file   Highest education level: High school graduate  Occupational History   Not on file  Tobacco Use   Smoking status: Every Day    Types: Cigarettes    Start date: 05/22/1986   Smokeless tobacco: Never   Tobacco comments:    Currently 1-2 cig per day - cut back- 03/25/23  Vaping Use   Vaping status: Never Used  Substance and Sexual Activity   Alcohol use: No    Alcohol/week: 0.0 standard drinks of alcohol   Drug use: No   Sexual activity: Yes    Partners: Male    Birth control/protection: Surgical, Injection  Other Topics Concern   Not on file  Social History Narrative   Not on file   Social Drivers of Health   Financial Resource Strain: Low Risk  (06/24/2024)   Overall Financial Resource Strain (CARDIA)    Difficulty of Paying Living Expenses: Not hard at all  Recent Concern: Financial Resource Strain - Medium Risk (05/11/2024)   Overall Financial Resource Strain (CARDIA)    Difficulty of Paying Living Expenses: Somewhat hard  Food Insecurity: No Food Insecurity (06/24/2024)   Hunger Vital Sign    Worried About Running Out of Food in the Last Year: Never true    Ran Out of Food in the Last Year:  Never true  Transportation Needs: No Transportation Needs (06/24/2024)   PRAPARE - Administrator, Civil Service (Medical): No    Lack of Transportation (Non-Medical): No  Physical Activity: Insufficiently Active (06/24/2024)   Exercise Vital Sign    Days of Exercise per Week: 3 days    Minutes of Exercise per Session: 10 min  Stress: No Stress Concern Present (06/24/2024)   [employer] of Occupational Health - Occupational Stress Questionnaire    Feeling of Stress: Only a little  Recent Concern: Stress - Stress Concern Present (05/11/2024)  [employer] of Occupational Health - Occupational Stress Questionnaire    Feeling of Stress : Very much  Social Connections: Socially Isolated (06/24/2024)   Social Connection and Isolation Panel    Frequency of Communication with Friends and Family: Twice a week    Frequency of Social Gatherings with Friends and Family: Never    Attends Religious Services: Never    Database administrator or Organizations: No    Attends Banker Meetings: Never    Marital Status: Separated    Allergies:  Allergies  Allergen Reactions   Azithromycin Other (See Comments)    headache   Codeine Other (See Comments)    unknown   Penicillins Other (See Comments)    headaches    Metabolic Disorder Labs: Lab Results  Component Value Date   HGBA1C 5.3 06/16/2024   MPG 105 06/16/2024   Lab Results  Component Value Date   PROLACTIN 4.3 03/29/2022   Lab Results  Component Value Date   CHOL 149 06/16/2024   TRIG 278 (H) 06/16/2024   HDL 28 (L) 06/16/2024   CHOLHDL 5.3 (H) 06/16/2024   LDLCALC 84 06/16/2024   LDLCALC  03/29/2022     Comment:     . LDL cholesterol not calculated. Triglyceride levels greater than 400 mg/dL invalidate calculated LDL results. . Reference range: <100 . Desirable range <100 mg/dL for primary prevention;   <70 mg/dL for patients with CHD or diabetic patients  with > or = 2 CHD risk  factors. SABRA LDL-C is now calculated using the Martin-Hopkins  calculation, which is a validated novel method providing  better accuracy than the Friedewald equation in the  estimation of LDL-C.  Gladis APPLETHWAITE et al. SANDREA. 7986;689(80): 2061-2068  (http://education.[employer].com/faq/FAQ164)    Lab Results  Component Value Date   TSH 1.16 06/16/2024   TSH 1.438 09/06/2021    Therapeutic Level Labs: No results found for: LITHIUM No results found for: VALPROATE No results found for: CBMZ  Current Medications: Current Outpatient Medications  Medication Sig Dispense Refill   albuterol  (PROVENTIL ) (2.5 MG/3ML) 0.083% nebulizer solution INHALE 3 ML BY NEBULIZATION EVERY 6 HOURS AS NEEDED FOR WHEEZING OR SHORTNESS OF BREATH 150 mL 1   albuterol  (VENTOLIN  HFA) 108 (90 Base) MCG/ACT inhaler INHALE 1 TO 2 PUFFS BY MOUTH DAILY 6.7 each 5   BREO ELLIPTA  100-25 MCG/ACT AEPB TAKE 1 PUFF BY MOUTH EVERY DAY 60 each 4   brexpiprazole  (REXULTI ) 0.25 MG TABS tablet Take 1 tablet (0.25 mg total) by mouth daily. 30 tablet 1   buPROPion  ER (WELLBUTRIN  SR) 100 MG 12 hr tablet Take 1 tablet (100 mg total) by mouth daily with breakfast. 90 tablet 1   clonazePAM  (KLONOPIN ) 0.5 MG tablet TAKE 0.5-1 TABLET BY MOUTH DAILY AS NEEDED FOR ANXIETY. SHOULD LAST 30 DAYS , PLEASE LIMIT USE. 18 tablet 0   cyclobenzaprine (FLEXERIL) 10 MG tablet Take 10 mg by mouth 3 (three) times daily as needed for muscle spasms.     DULoxetine  (CYMBALTA ) 20 MG capsule Take 1 capsule (20 mg total) by mouth daily. Take along with 60 mg daily , total of 80 mg 90 capsule 1   DULoxetine  (CYMBALTA ) 60 MG capsule TAKE 1 CAPSULE BY MOUTH EVERY DAY, take along with 20 mg daily 90 capsule 1   gabapentin  (NEURONTIN ) 300 MG capsule Take one pill (300mg ) every morning and 2 pills (600mg ) in the evening     meloxicam  (MOBIC ) 7.5 MG tablet TAKE 1 TABLET BY MOUTH  EVERY DAY (Patient not taking: Reported on 06/24/2024) 30 tablet 3   omeprazole   (PRILOSEC) 40 MG capsule Take 1 capsule (40 mg total) by mouth daily. 90 capsule 3   propranolol  ER (INDERAL  LA) 120 MG 24 hr capsule Take 1 capsule (120 mg total) by mouth daily. 90 capsule 1   rosuvastatin  (CRESTOR ) 20 MG tablet Take 1 tablet (20 mg total) by mouth daily. 90 tablet 3   topiramate  (TOPAMAX ) 100 MG tablet Take 100 mg by mouth daily.     traZODone  (DESYREL ) 50 MG tablet TAKE 0.5-1 TABLETS BY MOUTH AT BEDTIME AS NEEDED FOR SLEEP. 30 tablet 1   Ubrogepant 100 MG TABS Take by mouth.     varenicline  (CHANTIX ) 1 MG tablet TAKE 1 TABLET BY MOUTH TWICE A DAY 180 tablet 0   No current facility-administered medications for this visit.     Musculoskeletal: Strength & Muscle Tone: UTA Gait & Station: Seated Patient leans: N/A  Psychiatric Specialty Exam: Review of Systems  Psychiatric/Behavioral:  Positive for sleep disturbance.        Grief-improving    There were no vitals taken for this visit.There is no height or weight on file to calculate BMI.  General Appearance: Fairly Groomed  Eye Contact:  Fair  Speech:  Clear and Coherent  Volume:  Normal  Mood:  Grief  Affect:  Appropriate  Thought Process:  Goal Directed and Descriptions of Associations: Intact  Orientation:  Full (Time, Place, and Person)  Thought Content: Logical   Suicidal Thoughts:  No  Homicidal Thoughts:  No  Memory:  Immediate;   Fair Recent;   Fair Remote;   Fair  Judgement:  Fair  Insight:  Fair  Psychomotor Activity:  Normal  Concentration:  Concentration: Fair and Attention Span: Fair  Recall:  [employer] of Knowledge: Fair  Language: Fair  Akathisia:  No  Handed:  Right  AIMS (if indicated): not done  Assets:  Communication Skills Desire for Improvement Housing Talents/Skills Transportation  ADL's:  Intact  Cognition: WNL  Sleep:  improving   Screenings: AIMS    Flowsheet Row Video Visit from 08/22/2022 in [facility] Video Visit from  05/22/2022 in [facility]  AIMS Total Score 0 0   AUDIT    Flowsheet Row Office Visit from 05/22/2016 in [facility]  Alcohol Use Disorder Identification Test Final Score (AUDIT) 0   GAD-7    Flowsheet Row Office Visit from 06/16/2024 in [facility] from 05/11/2024 in [facility] Office Visit from 10/16/2023 in [facility] Video Visit from 08/22/2022 in [facility] Counselor from 03/05/2022 in [facility]  Total GAD-7 Score 18 14 16 11 20    Mini-Mental    Flowsheet Row Clinical Support from 09/27/2021 in [facility]  Total Score (max 30 points ) 29   PHQ2-9    Flowsheet Row Clinical Support from 06/24/2024 in [facility] Office Visit from 06/16/2024 in [facility] Counselor from 05/11/2024 in [facility] Office Visit from 10/16/2023 in [facility] Office Visit from 10/14/2023 in [facility]  PHQ-2 Total Score 2 6 5 5 2   PHQ-9 Total Score 4  18 18 19  --   Flowsheet Row Video Visit from 08/16/2024 in [facility] Video Visit from 06/04/2024 in [facility] Counselor from 05/11/2024 in [facility]  C-SSRS RISK CATEGORY Moderate Risk Moderate Risk Moderate Risk     Assessment and Plan: Jalaine Riggenbach is a 53 year old Caucasian female who has a history of MDD, panic attacks, tobacco use disorder, caffeine use disorder, insomnia was evaluated by telemedicine today.  Discussed assessment and  plan as noted below.  Panic disorder/Agoraphobia-improving Currently reports anxiety symptoms are better managed.  Continues to be motivated to stay in therapy which has been beneficial. Continue Clonazepam  0.5 mg as needed, uses it rarely. Continue Cymbalta  80 mg daily Continue psychotherapy sessions with Ms. Veva  Insomnia-improving Currently reports sleep is improving. Continue sleep hygiene techniques. Continue Trazodone  50 to 100 mg at bedtime as needed Continue over-the-counter Moringa supplement which has been beneficial for her.  MDD in partial remission Currently does report grief reaction which does cause sadness although improving. Continue psychotherapy sessions with Ms. Veva. Continue Wellbutrin  SR 100 mg daily Continue Cymbalta  80 mg daily Continue Rexulti  0.25 mg daily  Tobacco use disorder-unstable Continues to smoke cigarettes. Continue Chantix  1 mg twice a day. Will reevaluate in future sessions.  Caffeine use disorder-unstable Continues to use caffeine on a regular basis. Patient encouraged to cut back.  Follow-up Follow-up in clinic in 3 months or sooner if needed.   Collaboration of Care: Collaboration of Care: Referral or follow-up with counselor/therapist AEB patient encouraged to continue psychotherapy sessions  Patient/Guardian was advised Release of Information must be obtained prior to any record release in order to collaborate their care with an outside provider. Patient/Guardian was advised if they have not already done so to contact the registration department to sign all necessary forms in order for us  to release information regarding their care.   Consent: Patient/Guardian gives verbal consent for treatment and assignment of benefits for services provided during this visit. Patient/Guardian expressed understanding and agreed to proceed.   This note was generated in part or whole with voice recognition software. Voice recognition is usually  quite accurate but there are transcription errors that can and very often do occur. I apologize for any typographical errors that were not detected and corrected.    Mubashir Mallek, MD 08/18/2024, 8:31 AM

## 2024-08-23 ENCOUNTER — Ambulatory Visit: Payer: Self-pay

## 2024-08-23 NOTE — Telephone Encounter (Signed)
 FYI Only or Action Required?: FYI only for provider.  Patient was last seen in primary care on 11/01/2022 by Vincente Saber, NP.  Called Nurse Triage reporting Joint Swelling.  Symptoms began several days ago.  Interventions attempted: Nothing.  Symptoms are: unchanged.  Triage Disposition: See PCP When Office is Open (Within 3 Days)  Patient/caregiver understands and will follow disposition?: Yes  Copied from CRM #[id number]. Topic: Clinical - Red Word Triage >> Aug 23, 2024  4:11 PM Marissa P wrote: Red Word that prompted transfer to Nurse Triage: Left leg and ankle is swelling, started 3 days ago. Would like to get checked asap. Reason for Disposition  MODERATE ankle swelling (e.g., interferes with normal activities, can't move joint normally) (Exceptions: Itchy, localized swelling; swelling is chronic.)  Answer Assessment - Initial Assessment Questions 1. LOCATION: Which ankle is swollen? Where is the swelling?     Left ankle swelling 2. ONSET: When did the swelling start?     Three days ago 3. SWELLING: How bad is the swelling? Or, How large is it? (e.g., mild, moderate, severe; size of localized swelling)      Moderate swelling 4. PAIN: Is there any pain? If Yes, ask: How bad is it? (Scale 0-10; or none, mild, moderate, severe)     denies 5. CAUSE: What do you think caused the ankle swelling?     Fluid 6. OTHER SYMPTOMS: Do you have any other symptoms? (e.g., fever, chest pain, difficulty breathing, calf pain)     Denies all of these symptoms 7. PREGNANCY: Is there any chance you are pregnant? When was your last menstrual period?     N/a  Protocols used: Ankle Swelling-A-AH

## 2024-08-24 ENCOUNTER — Encounter: Payer: Self-pay | Admitting: Family Medicine

## 2024-08-24 ENCOUNTER — Ambulatory Visit (INDEPENDENT_AMBULATORY_CARE_PROVIDER_SITE_OTHER): Admitting: Family Medicine

## 2024-08-24 ENCOUNTER — Other Ambulatory Visit: Payer: Self-pay | Admitting: Family Medicine

## 2024-08-24 VITALS — BP 120/76 | HR 73 | Resp 16 | Ht 66.0 in | Wt 241.0 lb

## 2024-08-24 DIAGNOSIS — F4000 Agoraphobia, unspecified: Secondary | ICD-10-CM

## 2024-08-24 DIAGNOSIS — M7989 Other specified soft tissue disorders: Secondary | ICD-10-CM | POA: Diagnosis not present

## 2024-08-24 DIAGNOSIS — F410 Panic disorder [episodic paroxysmal anxiety] without agoraphobia: Secondary | ICD-10-CM | POA: Diagnosis not present

## 2024-08-24 DIAGNOSIS — Z6838 Body mass index (BMI) 38.0-38.9, adult: Secondary | ICD-10-CM | POA: Diagnosis not present

## 2024-08-24 DIAGNOSIS — E66812 Obesity, class 2: Secondary | ICD-10-CM

## 2024-08-24 DIAGNOSIS — E785 Hyperlipidemia, unspecified: Secondary | ICD-10-CM

## 2024-08-24 DIAGNOSIS — I10 Essential (primary) hypertension: Secondary | ICD-10-CM

## 2024-08-24 MED ORDER — PROPRANOLOL HCL ER 60 MG PO CP24: 60.0000 mg | ORAL_CAPSULE | Freq: Two times a day (BID) | ORAL | 1 refills | Status: AC

## 2024-08-24 MED ORDER — SEMAGLUTIDE-WEIGHT MANAGEMENT 0.25 MG/0.5ML ~~LOC~~ SOAJ: 0.2500 mg | SUBCUTANEOUS | 1 refills | Status: DC

## 2024-08-24 NOTE — Progress Notes (Signed)
 Patient ID: Mary Koch, female    DOB: [birth year], 53 y.o.   MRN: [id number]  PCP: Leavy Mole, PA-C  Chief Complaint  Patient presents with   Edema    L foot/ankle x3 days. No pain    Subjective:   Mary Koch is a 53 y.o. female, presents to clinic with CC of the following:  HPI  Discussed the use of AI scribe software for clinical note transcription with the patient, who gave verbal consent to proceed.  History of Present Illness Mary Koch is a 53 year old female who presents with unilateral left leg swelling.  Unilateral lower extremity edema - Hx of prior bilateral leg LE edema that was managinged with fluid pills but it did improve when she lost weight, returned x several days but only to left leg - Edema is pitting, leaving an indentation when pressed - Swelling decreases overnight - No associated pain - No recent changes in activity or dietary habits - History of recurrent lower extremity fluid retention, previously treated with diuretics (specific medication unknown)   Chronic lower extremity wound - Small, persistent wound on the leg -right outer shin - Treated with Neosporin without improvement - No use of hydrogen peroxide or alcohol on the wound  Antihypertensive and anxiolytic medication use - Takes propranolol  for anxiety and blood pressure management - Prefers dosing of 60 mg in the morning and 60 mg at night, rather than a single 120 mg dose - Uses leftover 60 mg tablets to achieve preferred dosing schedule - Dosing preference due to concerns about potential side effects and anxiety    Patient Active Problem List   Diagnosis Date Noted   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   Well woman exam 08/05/2023   MDD (major depressive disorder), recurrent, in full remission (HCC) 08/14/2022   Hot flashes 03/29/2022   Shortness of breath 09/25/2021   Severe episode of recurrent major depressive disorder, without psychotic features  (HCC) 09/13/2021   EKG abnormalities 09/10/2021   Insomnia due to medical condition 09/05/2021   At risk for prolonged QT interval syndrome 09/05/2021   Prediabetes 03/30/2021   Chronic pain of left knee 03/30/2021   MDD (major depressive disorder), recurrent episode, moderate (HCC) 11/13/2020   Essential hypertension 10/23/2020   Recurrent major depressive disorder, in full remission (HCC) 07/25/2020   Tick bite 07/10/2020   Blurred vision 07/10/2020   Lethargy 07/10/2020   High risk medication use 04/06/2020   Panic disorder 07/28/2019   Bereavement 07/28/2019   MDD (major depressive disorder), recurrent episode, with atypical features (HCC) 07/28/2019   Agoraphobia 07/28/2019   Insomnia due to other mental disorder 07/28/2019   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   Tobacco use disorder 12/16/2017   Anxiety 06/25/2015   Carpal tunnel syndrome 06/25/2015   Family planning 06/25/2015   Bloodgood disease 06/25/2015   Fibroid 06/25/2015   Acid reflux 06/25/2015   Excess, menstruation 06/25/2015   Obesity (BMI 30-39.9) 06/25/2015   Class 2 severe obesity with serious comorbidity and body mass index (BMI) of 38.0 to 38.9 in adult [facility]) 06/25/2015   Dysmenorrhea 06/25/2015   Disease of thyroid  gland 06/25/2015   Compulsive tobacco user syndrome 06/25/2015   Episodic tension type headache 08/23/2014   Allergic rhinitis 07/23/2013   Wheezing 07/23/2013   Lower urinary tract infectious disease 03/11/2013   Hematuria 03/11/2013   Diarrhea 01/13/2013   Vomiting 01/13/2013   Amenorrhea 12/22/2012   HLD (hyperlipidemia) 08/31/2011  Current Outpatient Medications:    albuterol  (PROVENTIL ) (2.5 MG/3ML) 0.083% nebulizer solution, INHALE 3 ML BY NEBULIZATION EVERY 6 HOURS AS NEEDED FOR WHEEZING OR SHORTNESS OF BREATH, Disp: 150 mL, Rfl: 1   albuterol  (VENTOLIN  HFA) 108 (90 Base) MCG/ACT inhaler, INHALE 1 TO 2 PUFFS BY MOUTH DAILY, Disp: 6.7 each, Rfl: 5   BREO ELLIPTA  100-25 MCG/ACT  AEPB, TAKE 1 PUFF BY MOUTH EVERY DAY, Disp: 60 each, Rfl: 4   brexpiprazole  (REXULTI ) 0.25 MG TABS tablet, Take 1 tablet (0.25 mg total) by mouth daily., Disp: 30 tablet, Rfl: 1   buPROPion  ER (WELLBUTRIN  SR) 100 MG 12 hr tablet, Take 1 tablet (100 mg total) by mouth daily with breakfast., Disp: 90 tablet, Rfl: 1   clonazePAM  (KLONOPIN ) 0.5 MG tablet, TAKE 0.5-1 TABLET BY MOUTH DAILY AS NEEDED FOR ANXIETY. SHOULD LAST 30 DAYS , PLEASE LIMIT USE., Disp: 18 tablet, Rfl: 0   cyclobenzaprine (FLEXERIL) 10 MG tablet, Take 10 mg by mouth 3 (three) times daily as needed for muscle spasms., Disp: , Rfl:    DULoxetine  (CYMBALTA ) 20 MG capsule, Take 1 capsule (20 mg total) by mouth daily. Take along with 60 mg daily , total of 80 mg, Disp: 90 capsule, Rfl: 1   DULoxetine  (CYMBALTA ) 60 MG capsule, TAKE 1 CAPSULE BY MOUTH EVERY DAY, take along with 20 mg daily, Disp: 90 capsule, Rfl: 1   gabapentin  (NEURONTIN ) 300 MG capsule, Take one pill (300mg ) every morning and 2 pills (600mg ) in the evening, Disp: , Rfl:    omeprazole  (PRILOSEC) 40 MG capsule, Take 1 capsule (40 mg total) by mouth daily., Disp: 90 capsule, Rfl: 3   propranolol  ER (INDERAL  LA) 120 MG 24 hr capsule, Take 1 capsule (120 mg total) by mouth daily., Disp: 90 capsule, Rfl: 1   rosuvastatin  (CRESTOR ) 20 MG tablet, Take 1 tablet (20 mg total) by mouth daily., Disp: 90 tablet, Rfl: 3   topiramate  (TOPAMAX ) 100 MG tablet, Take 100 mg by mouth daily., Disp: , Rfl:    traZODone  (DESYREL ) 50 MG tablet, TAKE 0.5-1 TABLETS BY MOUTH AT BEDTIME AS NEEDED FOR SLEEP., Disp: 30 tablet, Rfl: 1   Ubrogepant 100 MG TABS, Take by mouth., Disp: , Rfl:    varenicline  (CHANTIX ) 1 MG tablet, TAKE 1 TABLET BY MOUTH TWICE A DAY, Disp: 180 tablet, Rfl: 0   meloxicam  (MOBIC ) 7.5 MG tablet, TAKE 1 TABLET BY MOUTH EVERY DAY (Patient not taking: Reported on 08/24/2024), Disp: 30 tablet, Rfl: 3   Allergies  Allergen Reactions   Azithromycin Other (See Comments)    headache    Codeine Other (See Comments)    unknown   Penicillins Other (See Comments)    headaches     Social History   Tobacco Use   Smoking status: Every Day    Types: Cigarettes    Start date: 05/22/1986   Smokeless tobacco: Never   Tobacco comments:    Currently 1-2 cig per day - cut back- 03/25/23  Vaping Use   Vaping status: Never Used  Substance Use Topics   Alcohol use: No    Alcohol/week: 0.0 standard drinks of alcohol   Drug use: No      Chart Review Today: I personally reviewed active problem list, medication list, allergies, family history, social history, health maintenance, notes from last encounter, lab results, imaging with the patient/caregiver today.   Review of Systems  Constitutional: Negative.   HENT: Negative.    Eyes: Negative.   Respiratory: Negative.  Cardiovascular: Negative.   Gastrointestinal: Negative.   Endocrine: Negative.   Genitourinary: Negative.   Musculoskeletal: Negative.   Skin: Negative.   Allergic/Immunologic: Negative.   Neurological: Negative.   Hematological: Negative.   Psychiatric/Behavioral: Negative.    All other systems reviewed and are negative.      Objective:   Vitals:   08/24/24 1123  BP: 120/76  Pulse: 73  Resp: 16  SpO2: 98%  Weight: 241 lb (109.3 kg)  Height: 5' 6 (1.676 m)    Body mass index is 38.9 kg/m.  Physical Exam Vitals and nursing note reviewed.  Constitutional:      General: She is not in acute distress.    Appearance: Normal appearance. She is well-developed. She is obese. She is not ill-appearing, toxic-appearing or diaphoretic.  HENT:     Head: Normocephalic and atraumatic.     Right Ear: External ear normal.     Left Ear: External ear normal.     Nose: Nose normal.  Eyes:     General: No scleral icterus.       Right eye: No discharge.        Left eye: No discharge.     Conjunctiva/sclera: Conjunctivae normal.  Neck:     Trachea: No tracheal deviation.  Cardiovascular:     Rate  and Rhythm: Normal rate.     Comments: Calf circumference L 40 cm, R 39 cm Pulmonary:     Effort: Pulmonary effort is normal. No respiratory distress.     Breath sounds: No stridor.  Musculoskeletal:     Right lower leg: 1+ Edema present.     Left lower leg: No tenderness or bony tenderness. 2+ Edema present.  Skin:    General: Skin is warm and dry.     Findings: No rash.  Neurological:     Mental Status: She is alert.     Motor: No abnormal muscle tone.     Coordination: Coordination normal.     Gait: Gait normal.  Psychiatric:        Mood and Affect: Mood normal.        Behavior: Behavior normal.      Results for orders placed or performed in visit on 06/16/24  HgB A1c   Collection Time: 06/16/24  2:24 PM  Result Value Ref Range   Hgb A1c MFr Bld 5.3 <5.7 %   Mean Plasma Glucose 105 mg/dL   eAG (mmol/L) 5.8 mmol/L  Comprehensive Metabolic Panel (CMET)   Collection Time: 06/16/24  2:24 PM  Result Value Ref Range   Glucose, Bld 81 65 - 99 mg/dL   BUN 10 7 - 25 mg/dL   Creat 9.11 9.49 - 8.96 mg/dL   eGFR 80 > OR = 60 fO/fpw/8.26f7   BUN/Creatinine Ratio SEE NOTE: 6 - 22 (calc)   Sodium 143 135 - 146 mmol/L   Potassium 4.5 3.5 - 5.3 mmol/L   Chloride 107 98 - 110 mmol/L   CO2 28 20 - 32 mmol/L   Calcium  9.9 8.6 - 10.4 mg/dL   Total Protein 6.8 6.1 - 8.1 g/dL   Albumin 4.2 3.6 - 5.1 g/dL   Globulin 2.6 1.9 - 3.7 g/dL (calc)   AG Ratio 1.6 1.0 - 2.5 (calc)   Total Bilirubin 0.3 0.2 - 1.2 mg/dL   Alkaline phosphatase (APISO) 77 37 - 153 U/L   AST 13 10 - 35 U/L   ALT 16 6 - 29 U/L  Urine Microalbumin w/creat. ratio   Collection  Time: 06/16/24  2:24 PM  Result Value Ref Range   Creatinine, Urine 156 20 - 275 mg/dL   Microalb, Ur 1.0 mg/dL   Microalb Creat Ratio 6 <30 mg/g creat  CBC with Differential/Platelet   Collection Time: 06/16/24  2:24 PM  Result Value Ref Range   WBC 12.0 (H) 3.8 - 10.8 Thousand/uL   RBC 5.16 (H) 3.80 - 5.10 Million/uL   Hemoglobin 14.6  11.7 - 15.5 g/dL   HCT 53.8 (H) 64.9 - 54.9 %   MCV 89.3 80.0 - 100.0 fL   MCH 28.3 27.0 - 33.0 pg   MCHC 31.7 (L) 32.0 - 36.0 g/dL   RDW 86.8 88.9 - 84.9 %   Platelets 287 140 - 400 Thousand/uL   MPV 10.1 7.5 - 12.5 fL   Neutro Abs 7,020 1,500 - 7,800 cells/uL   Absolute Lymphocytes 3,360 850 - 3,900 cells/uL   Absolute Monocytes 660 200 - 950 cells/uL   Eosinophils Absolute 912 (H) 15 - 500 cells/uL   Basophils Absolute 48 0 - 200 cells/uL   Neutrophils Relative % 58.5 %   Total Lymphocyte 28.0 %   Monocytes Relative 5.5 %   Eosinophils Relative 7.6 %   Basophils Relative 0.4 %  HIV antibody (with reflex)   Collection Time: 06/16/24  2:24 PM  Result Value Ref Range   HIV 1&2 Ab, 4th Generation NON-REACTIVE NON-REACTIVE  Hepatitis C Antibody   Collection Time: 06/16/24  2:24 PM  Result Value Ref Range   Hepatitis C Ab NON-REACTIVE NON-REACTIVE  Lipid Profile   Collection Time: 06/16/24  2:24 PM  Result Value Ref Range   Cholesterol 149 <200 mg/dL   HDL 28 (L) > OR = 50 mg/dL   Triglycerides 721 (H) <150 mg/dL   LDL Cholesterol (Calc) 84 mg/dL (calc)   Total CHOL/HDL Ratio 5.3 (H) <5.0 (calc)   Non-HDL Cholesterol (Calc) 121 <130 mg/dL (calc)  Celiac Disease Panel   Collection Time: 06/16/24  2:24 PM  Result Value Ref Range   Immunoglobulin A 348 (H) 47 - 310 mg/dL   Gliadin IgA 1.4 U/mL   Gliadin IgG <1.0 U/mL   (tTG) Ab, IgG <1.0 U/mL   (tTG) Ab, IgA <1.0 U/mL  TSH   Collection Time: 06/16/24  2:24 PM  Result Value Ref Range   TSH 1.16 mIU/L       Assessment & Plan:     Assessment & Plan 1. Swelling of left lower extremity (Primary)  New onset left leg edema with pitting, no pain. Differential includes fluid retention, venous insufficiency, or DVT. No acute distress. Discussed salt intake and gravity's role in edema. - Order ultrasound of the left leg to rule out DVT. - US  Venous Img Lower Unilateral Left - Advise low-salt diet to manage fluid  retention. - Consider compression stockings if edema persists. - could restart diuretic but not today with only a few days of sx and improvement at night and in am - Refer to vascular specialist if ultrasound is negative and edema persists.  Class 2 obesity with comorbidities Obesity with prediabetes and hypertension. Discussed weight loss medications like Wegovy . Insurance coverage uncertain. Discussed checking insurance for coverage and using Wegovy  or tirzepatide if covered for other diagnoses. - Send prescription for We[facility]y  to [facility]. - Instruct her to check with insurance for coverage of weight loss medications. - previously on ozempic  with last PCP for A1c over 5.7   Prediabetes Prediabetes with hemoglobin A1c of 6.0%. Discussed potential use  of Ozempic , though insurance coverage may be limited without type 2 diabetes diagnosis. Last A1c obtained was while she was on ozempic  tx, may need to recheck labs off meds for 3+ m Lab Results  Component Value Date   HGBA1C 5.3 06/16/2024     Essential hypertension - Switch propranolol  prescription back to 60 mg twice daily dosing. - Send updated prescription to [facility].  Panic disorder Prefers BID dosing for propranolol  - Reassure her about propranolol 's extended-release formulation and its effect on anxiety. - Adjust propranolol  dosing to her preference to avoid triggering anxiety.  Recording duration: 16 minutes   2. Essential hypertension - propranolol  ER (INDERAL  LA) 60 MG 24 hr capsule; Take 1 capsule (60 mg total) by mouth in the morning and at bedtime.  Dispense: 180 capsule; Refill: 1  3. Hyperlipidemia, unspecified hyperlipidemia type - semaglutide -weight management (WEGOVY ) 0.25 MG/0.5ML SOAJ SQ injection; Inject 0.25 mg into the skin once a week.  Dispense: 2 mL; Refill: 1  4. Class 2 severe obesity with serious comorbidity and body mass index (BMI) of 38.0 to 38.9 in adult, unspecified obesity type (HCC) -  semaglutide -weight management (WEGOVY ) 0.25 MG/0.5ML SOAJ SQ injection; Inject 0.25 mg into the skin once a week.  Dispense: 2 mL; Refill: 1  5. Panic disorder - propranolol  ER (INDERAL  LA) 60 MG 24 hr capsule; Take 1 capsule (60 mg total) by mouth in the morning and at bedtime.  Dispense: 180 capsule; Refill: 1  6. Agoraphobia - propranolol  ER (INDERAL  LA) 60 MG 24 hr capsule; Take 1 capsule (60 mg total) by mouth in the morning and at bedtime.  Dispense: 180 capsule; Refill: 1   Keep Oct f/up appt  Michelene Cower, PA-C 08/24/24 11:47 AM

## 2024-08-24 NOTE — Patient Instructions (Signed)
 Call central imaging to follow up on the ultrasound on your leg Call central scheduling [phone]  Let me know what happens with the propranolol  prescription changes and let me know about wegovy 

## 2024-08-26 ENCOUNTER — Ambulatory Visit

## 2024-08-26 ENCOUNTER — Ambulatory Visit: Admitting: Professional Counselor

## 2024-08-26 NOTE — Telephone Encounter (Signed)
 Requested medications are due for refill today.  See note  Requested medications are on the active medications list.  yes  Last refill. 08/24/2024 2mL 1 rf  Future visit scheduled.   yes  Notes to clinic.  Pharmacy comment: Alternative Requested:NOT COVERED BY [employer] REQUESTING ALTERNATIVE.     Requested Prescriptions  Pending Prescriptions Disp Refills   WEGOVY  0.25 MG/0.5ML SOAJ SQ injection [Pharmacy Med Name: WEGOVY  0.25 MG/0.5 ML PEN]  1    Sig: INJECT 0.25MG  INTO THE SKIN ONE TIME PER WEEK     Endocrinology:  Diabetes - GLP-1 Receptor Agonists - semaglutide  Passed - 08/26/2024 10:15 AM      Passed - HBA1C in normal range and within 180 days    HbA1c POC (<> result, manual entry)  Date Value Ref Range Status  03/30/2021 5.2 4.0 - 5.6 % Final   Hgb A1c MFr Bld  Date Value Ref Range Status  06/16/2024 5.3 <5.7 % Final    Comment:    For the purpose of screening for the presence of diabetes: . <5.7%       Consistent with the absence of diabetes 5.7-6.4%    Consistent with increased risk for diabetes             (prediabetes) > or =6.5%  Consistent with diabetes . This assay result is consistent with a decreased risk of diabetes. . Currently, no consensus exists regarding use of hemoglobin A1c for diagnosis of diabetes in children. . According to American Diabetes Association (ADA) guidelines, hemoglobin A1c <7.0% represents optimal control in non-pregnant diabetic patients. Different metrics may apply to specific patient populations.  Standards of Medical Care in Diabetes(ADA). .          Passed - Cr in normal range and within 360 days    Creat  Date Value Ref Range Status  06/16/2024 0.88 0.50 - 1.03 mg/dL Final   Creatinine, Urine  Date Value Ref Range Status  06/16/2024 156 20 - 275 mg/dL Final         Passed - Valid encounter within last 6 months    Recent Outpatient Visits           2 days ago Swelling of left lower extremity   [facility]  [facility] Leavy Mole, PA-C   2 months ago Encounter for medical examination to establish care   [facility] Leavy Mole, [place]       Future Appointments             In 2 months Leavy Mole, PA-C [facility], [place]

## 2024-08-31 ENCOUNTER — Ambulatory Visit
Admission: RE | Admit: 2024-08-31 | Discharge: 2024-08-31 | Disposition: A | Discharge status: Home / Self Care | Source: Ambulatory Visit | Attending: Family Medicine | Admitting: Family Medicine

## 2024-08-31 DIAGNOSIS — M7989 Other specified soft tissue disorders: Secondary | ICD-10-CM | POA: Diagnosis not present

## 2024-08-31 DIAGNOSIS — R600 Localized edema: Secondary | ICD-10-CM | POA: Diagnosis not present

## 2024-09-01 ENCOUNTER — Telehealth: Payer: Self-pay | Admitting: Pharmacy Technician

## 2024-09-01 ENCOUNTER — Other Ambulatory Visit (HOSPITAL_COMMUNITY): Payer: Self-pay

## 2024-09-01 NOTE — Telephone Encounter (Signed)
 Pharmacy Patient Advocate Encounter  Received notification from [employer] that Prior Authorization for Wegovy  0.25MG /0.5ML auto-injectors  has been DENIED.  Full denial letter will be uploaded to the media tab. See denial reason below.     PA #/Case ID/Reference #: [id number]

## 2024-09-01 NOTE — Telephone Encounter (Signed)
 PA request has been Started. New Encounter has been or will be created for follow up. For additional info see Pharmacy Prior Auth telephone encounter from 09/01/24.

## 2024-09-01 NOTE — Telephone Encounter (Signed)
 Pharmacy Patient Advocate Encounter   Received notification from RX Request Messages that prior authorization for Wegovy  0.25MG /0.5ML auto-injectors  is required/requested.   Insurance verification completed.   The patient is insured through [employer] .   Per test claim: PA required; PA started via CoverMyMeds. KEY [id number] . Waiting for clinical questions to populate.

## 2024-09-06 ENCOUNTER — Ambulatory Visit: Payer: Self-pay | Admitting: Family Medicine

## 2024-09-06 ENCOUNTER — Ambulatory Visit

## 2024-09-06 VITALS — BP 137/84 | HR 83 | Ht 66.5 in | Wt 245.0 lb

## 2024-09-06 DIAGNOSIS — Z3042 Encounter for surveillance of injectable contraceptive: Secondary | ICD-10-CM

## 2024-09-06 MED ORDER — MEDROXYPROGESTERONE ACETATE 150 MG/ML IM SUSP
150.0000 mg | Freq: Once | INTRAMUSCULAR | Status: AC
  Administered 2024-09-06: 150 mg via INTRAMUSCULAR

## 2024-09-06 NOTE — Progress Notes (Signed)
    NURSE VISIT NOTE  Subjective:    Patient ID: Mary Koch, female    DOB: [birth year], 53 y.o.   MRN: [id number]  HPI  Patient is a 53 y.o. [id number] female who presents for depo provera  injection.   Objective:    BP 137/84   Pulse 83   Ht 5' 6.5 (1.689 m)   Wt 245 lb (111.1 kg)   BMI 38.95 kg/m   Last Annual: 08/05/23- Due. Last pap: 06/06/22. Last Depo-Provera : 06/14/24. Side Effects if any: N/a. Serum HCG indicated? No. Depo-Provera  150 mg IM given by: Waddell Maxim, CMA. Site: Left Deltoid   Assessment:   1. Encounter for Depo-Provera  contraception      Plan:   Next appointment due between 11/22/24 and 12/06/24.    Waddell JONELLE Maxim, CMA

## 2024-09-06 NOTE — Patient Instructions (Signed)
 Birth Control Shot: What to Expect A birth control shot prevents pregnancy. A birth control shot is put in (injected) into the skin or a muscle. The shot contains the hormone progestin. Hormones are chemicals that affect how the body works. Progestin prevents pregnancy because it: Stops the ovaries from releasing eggs. Makes cervical mucus thicker. This prevents sperm from getting into the cervix. The cervix is the lowest part of the uterus. Thins the lining of the uterus to prevent a fertilized egg from attaching to the uterus. Tell a health care provider about: Any allergies you have. All medicines you take. These include vitamins, herbs, eye drops, and creams. Any bleeding problems you have. Any medical problems you have. Whether you're pregnant or may be pregnant. What are the risks? Your health care provider will talk with you about risks. These may include: Mood changes or depression. Your bones becoming thinner or weaker. This is called loss of bone density. This can happen if you get the shots for a long period of time. This can cause your bones to break. Blood clots. These are rare. A higher risk of an egg being fertilized outside your uterus, called an ectopic pregnancy.This is rare. What happens before? Your provider may do a physical exam. You may have a test to make sure you aren't pregnant. What happens during a birth control shot?  The area where the shot will be given will be cleaned. A needle will be put into a muscle in your upper arm or butt, or into the skin of your thigh or belly. The needle will be put on a syringe with the medicine in it. The medicine will be pushed through the syringe into your body. A small bandage may be put over the place where the shot was given. What happens after? After the shot, it's common to have: Soreness around the place where the shot was given for a couple of days. Spotting or bleeding between periods. Weight gain. Tender  breasts. Headaches. Belly pain. Ask your provider if you need to use an added method of birth control, such as a condom, sponge, or spermicide. If the first shot is given 1-7 days after the start of your last period, you won't need to use an added method of birth control. If the first shot is given at any other time during your menstrual cycle, you'll need to use an added method of birth control for 7 days after you get the shot. Follow these instructions at home: General instructions Take your medicines only as told. Do not rub or massage the place where the shot was given. Track your periods. This will help you know if they become irregular. Always use a condom to protect against sexually transmitted infections (STIs). Make an appointment in time for your next shot and mark it on your calendar. You must get a shot every 3 months (12-13 weeks) to prevent pregnancy. Lifestyle Do not smoke, vape, or use nicotine or tobacco. Eat foods that are high in calcium and vitamin D, such as milk, cheese, and salmon. Calcium helps keep your bones strong and may help with any loss in bone density caused by the birth control shot. Ask your provider if you should take supplements. Contact a health care provider if you: Have discharge or bleeding from your vagina that isn't normal. Miss a period or think you might be pregnant. Have mood changes or depression. Feel dizzy or light-headed. Have leg pain. Get help right away if you: Have chest pain  or cough up blood. Have trouble breathing. Have a really bad headache that doesn't go away. Have numbness in any part of your body. Have really bad pain in your belly. Have slurred speech or vision problems. These symptoms may be an emergency. Call 911 right away. Do not wait to see if the symptoms will go away. Do not drive yourself to the hospital. This information is not intended to replace advice given to you by your health care provider. Make sure you  discuss any questions you have with your health care provider. Document Revised: 08/21/2023 Document Reviewed: 08/21/2023 Elsevier Patient Education  2024 [employer].

## 2024-09-07 ENCOUNTER — Ambulatory Visit (INDEPENDENT_AMBULATORY_CARE_PROVIDER_SITE_OTHER): Admitting: Professional Counselor

## 2024-09-07 DIAGNOSIS — F410 Panic disorder [episodic paroxysmal anxiety] without agoraphobia: Secondary | ICD-10-CM | POA: Diagnosis not present

## 2024-09-07 DIAGNOSIS — F4000 Agoraphobia, unspecified: Secondary | ICD-10-CM | POA: Diagnosis not present

## 2024-09-07 DIAGNOSIS — F331 Major depressive disorder, recurrent, moderate: Secondary | ICD-10-CM

## 2024-09-07 NOTE — Progress Notes (Signed)
  THERAPIST PROGRESS NOTE  Session Time: 2:03 PM - 2:48 PM   Participation Level: Active  Behavioral Response: Well Groomed, Alert, Anxious  Type of Therapy: Individual Therapy  Treatment Goals addressed: Active OP Anxiety  LTG: I want to be able to go out in public, take my yougins places. Anxiety and panic attacks.                Start:  06/21/24    Expected End:  06/20/25      STG: To reduce symptoms of anxiety AEB reduction in GAD7 scores by using coping mechanisms over the next 90 days.     STG: I want to go grocery shopping without worrying about someone staring or judging you. To increase daily activities AEB restructuring maladaptive patterns of thinking and exposure exercises over the next 90 days.     STG: Well you can't bring my brother and my daddy back. To reduce impact of grief/loss AEB movement through stages of grief and coming to a place of acceptance over the next 12 weeks.    ProgressTowards Goals: Progressing  Interventions: CBT, Motivational Interviewing, and Supportive  Summary: Mary Koch is a 53 y.o. female who presents with a history of anxiety, depression, panic disorder, and agoraphobia. She appeared alert and oriented x5. She stated not much is going on, citing her life is boring. She engaged in discussion about various things with her sons. She noted she wished that she was happier and upon questioning stated she would like to be in a relationship. She noted her struggle with going out makes it impossible for her to date. Mary Koch reported she attempted to go to [employer] last Saturday and immediately left when she saw how many cars were in the parking lot. She was receptive to urge surfing and gradual exposure. She took home handouts to help her practice over the next few weeks.   Therapist Response: Conducted session with [name]. Began session with check-in/update since previous session. Utilized empathetic and reflective listening. Used open-ended  questions to facilitate discussion and summarized Mary Koch's thoughts/feelings. Explained urge surfing and gradual exposure exercises to help reduce safety behaviors and anxiety. Provided handouts for [facility] to practice. Scheduled additional appointment and concluded session.   Suicidal/Homicidal: No  Plan: Return again in 3 weeks.  Diagnosis: Agoraphobia  Panic disorder  MDD (major depressive disorder), recurrent episode, moderate (HCC)  Collaboration of Care: Medication Management AEB chart review  Patient/Guardian was advised Release of Information must be obtained prior to any record release in order to collaborate their care with an outside provider. Patient/Guardian was advised if they have not already done so to contact the registration department to sign all necessary forms in order for us  to release information regarding their care.   Consent: Patient/Guardian gives verbal consent for treatment and assignment of benefits for services provided during this visit. Patient/Guardian expressed understanding and agreed to proceed.   Mary Koch, [facility] 09/07/2024

## 2024-09-21 NOTE — Progress Notes (Signed)
 Mary Koch                                          MRN: [id number]   09/21/2024   The [facility] Quality Team Specialist reviewed this patient medical record for the purposes of chart review for care gap closure. The following were reviewed: chart review for care gap closure-diabetic eye exam.    [facility] Quality Team

## 2024-09-27 ENCOUNTER — Other Ambulatory Visit: Payer: Self-pay | Admitting: Psychiatry

## 2024-09-27 DIAGNOSIS — F410 Panic disorder [episodic paroxysmal anxiety] without agoraphobia: Secondary | ICD-10-CM

## 2024-09-28 ENCOUNTER — Ambulatory Visit (INDEPENDENT_AMBULATORY_CARE_PROVIDER_SITE_OTHER): Admitting: Professional Counselor

## 2024-09-28 DIAGNOSIS — F331 Major depressive disorder, recurrent, moderate: Secondary | ICD-10-CM | POA: Diagnosis not present

## 2024-09-28 DIAGNOSIS — F4000 Agoraphobia, unspecified: Secondary | ICD-10-CM

## 2024-09-28 DIAGNOSIS — F4001 Agoraphobia with panic disorder: Secondary | ICD-10-CM | POA: Diagnosis not present

## 2024-09-28 DIAGNOSIS — F410 Panic disorder [episodic paroxysmal anxiety] without agoraphobia: Secondary | ICD-10-CM

## 2024-09-28 NOTE — Progress Notes (Signed)
 THERAPIST PROGRESS NOTE  Virtual Visit via Video Note  I connected with Mary Koch on 09/28/24 at  2:00 PM EDT by a video enabled telemedicine application and verified that I am speaking with the correct person using two identifiers.  Location: Patient: Home Provider: Office    I discussed the limitations of evaluation and management by telemedicine and the availability of in person appointments. The patient expressed understanding and agreed to proceed.   I discussed the assessment and treatment plan with the patient. The patient was provided an opportunity to ask questions and all were answered. The patient agreed with the plan and demonstrated an understanding of the instructions.   The patient was advised to call back or seek an in-person evaluation if the symptoms worsen or if the condition fails to improve as anticipated.  I provided 21 minutes of non-face-to-face time during this encounter. Mary Koch, [facility]  Session Time: 2:02 PM - 2:23 PM  Participation Level: Active  Behavioral Response: Casual, Alert, Anxious  Type of Therapy: Individual Therapy  Treatment Goals addressed: Active OP Anxiety  LTG: I want to be able to go out in public, take my yougins places. Anxiety and panic attacks. (Progressing)    Start:  06/21/24    Expected End:  06/20/25    Goal Note Continues to be relevant goal to work on  STG: To reduce symptoms of anxiety AEB reduction in GAD7 scores by using coping mechanisms over the next 90 days.  (Progressing)  Goal Note Reviewed 09/28/24 - Right now it's rough. I've been waking up around 1-3 in the morning. I feel like I can't even breath. GAD7 score reduced by 2 points but is still in severe range  STG: I want to go grocery shopping without worrying about someone staring or judging you. To increase daily activities AEB restructuring maladaptive patterns of thinking and exposure exercises over the next 90 days.  (Progressing)  Goal  Note Reviewed 09/28/24 - You've helped me, them breathing exercises, like blowing out the candles, helps a whole lot. I guess I need to do it more.  STG: Well you can't bring my brother and my daddy back. To reduce impact of grief/loss AEB movement through stages of grief and coming to a place of acceptance over the next 12 weeks.  (Not Progressing)  Goal Note Reviewed 09/28/24 - That needs more work.   ProgressTowards Goals: Progressing  Interventions: Solution Focused, Supportive, and Other: Coping skills  Summary: Mary Koch is a 53 y.o. female who presents with a history of anxiety, depression, and agoraphobia. She appeared somber but oriented x5. She stated she could be better. She stated she didn't have gas/money to get to the office for her session. She reported having a migraine and was waiting for session to take medication because it makes her sleep. Mary Koch noted progress on goals and areas for continued improvement. She has been having anxiety attacks in the middle of the night and was receptive to practicing coping skills more consistently to help.   Therapist Response: Conducted session with Mary Koch. Began session with check-in/update since previous session. Utilized empathetic and reflective listening. Reviewed treatment plan with input from Mary Koch on current strengths, needs, and progress towards goals. Reviewed coping skills for anxiety and reminded Mary Koch of importance to practice consistently. Shared butterfly hug as additional skill. Scheduled additional appointment and concluded session.   Suicidal/Homicidal: No  Plan: Return again in 3 weeks.  Diagnosis: Agoraphobia  Panic disorder  MDD (major  depressive disorder), recurrent episode, moderate (HCC)  Collaboration of Care: Medication Management AEB chart review   Patient/Guardian was advised Release of Information must be obtained prior to any record release in order to collaborate their care with an outside  provider. Patient/Guardian was advised if they have not already done so to contact the registration department to sign all necessary forms in order for us  to release information regarding their care.   Consent: Patient/Guardian gives verbal consent for treatment and assignment of benefits for services provided during this visit. Patient/Guardian expressed understanding and agreed to proceed.   Mary Koch, [facility] 09/28/2024

## 2024-10-04 ENCOUNTER — Other Ambulatory Visit: Payer: Self-pay | Admitting: Psychiatry

## 2024-10-04 DIAGNOSIS — F331 Major depressive disorder, recurrent, moderate: Secondary | ICD-10-CM

## 2024-10-04 DIAGNOSIS — G4701 Insomnia due to medical condition: Secondary | ICD-10-CM

## 2024-10-06 ENCOUNTER — Ambulatory Visit: Admitting: Obstetrics

## 2024-10-19 ENCOUNTER — Ambulatory Visit (INDEPENDENT_AMBULATORY_CARE_PROVIDER_SITE_OTHER): Admitting: Professional Counselor

## 2024-10-19 ENCOUNTER — Other Ambulatory Visit: Payer: Self-pay | Admitting: Family Medicine

## 2024-10-19 DIAGNOSIS — F17200 Nicotine dependence, unspecified, uncomplicated: Secondary | ICD-10-CM | POA: Diagnosis not present

## 2024-10-19 DIAGNOSIS — F4000 Agoraphobia, unspecified: Secondary | ICD-10-CM | POA: Diagnosis not present

## 2024-10-19 DIAGNOSIS — E785 Hyperlipidemia, unspecified: Secondary | ICD-10-CM

## 2024-10-19 DIAGNOSIS — F331 Major depressive disorder, recurrent, moderate: Secondary | ICD-10-CM

## 2024-10-19 DIAGNOSIS — Z6838 Body mass index (BMI) 38.0-38.9, adult: Secondary | ICD-10-CM

## 2024-10-19 NOTE — Progress Notes (Unsigned)
  THERAPIST PROGRESS NOTE  Session Time: 2:01 PM - 2:47 PM   Participation Level: Active  Behavioral Response: Casual, Alert, Anxious and Dysphoric  Type of Therapy: Individual Therapy  Treatment Goals addressed: Active OP Anxiety  LTG: I want to be able to go out in public, take my yougins places. Anxiety and panic attacks. (Progressing)                Start:  06/21/24    Expected End:  06/20/25    Goal Note Continues to be relevant goal to work on   STG: To reduce symptoms of anxiety AEB reduction in GAD7 scores by using coping mechanisms over the next 90 days.  (Progressing)  Goal Note Reviewed 09/28/24 - Right now it's rough. I've been waking up around 1-3 in the morning. I feel like I can't even breath. GAD7 score reduced by 2 points but is still in severe range   STG: I want to go grocery shopping without worrying about someone staring or judging you. To increase daily activities AEB restructuring maladaptive patterns of thinking and exposure exercises over the next 90 days.  (Progressing)  Goal Note Reviewed 09/28/24 - You've helped me, them breathing exercises, like blowing out the candles, helps a whole lot. I guess I need to do it more.   STG: Well you can't bring my brother and my daddy back. To reduce impact of grief/loss AEB movement through stages of grief and coming to a place of acceptance over the next 12 weeks.  (Not Progressing)  Goal Note Reviewed 09/28/24 - That needs more work.   ProgressTowards Goals: Progressing  Interventions: CBT, Motivational Interviewing, and Supportive  Summary: Mary Koch is a 53 y.o. female who presents with history of anxiety/panic, depression, and agoraphobia. She appeared somber but oriented x5. She stated their vacation was a mess. After some discussion, she was able to identify good/positive parts of their vacation. Mary Koch expressed hopelessness about finding happiness again. She discussed her lost relationship with her  son's father. She was receptive to opposite action for love and information on trauma bonds.   Therapist Response: Conducted session with [name]. Began session with check-in/update since previous session. Utilized empathetic and reflective listening. Used open-ended questions to facilitate discussion and summarized Mary Koch's thoughts/feelings. Discussed facts that support the emotion of love and shared opposite action emotion regulation skill. Explained trauma bonds and how behaviors maintain or break that bond. Scheduled additional appointment and concluded session.   Suicidal/Homicidal: No  Plan: Return again in 2 weeks.  Diagnosis: MDD (major depressive disorder), recurrent episode, moderate (HCC)  Agoraphobia  Tobacco use disorder  Collaboration of Care: Medication Management AEB chart review  Patient/Guardian was advised Release of Information must be obtained prior to any record release in order to collaborate their care with an outside provider. Patient/Guardian was advised if they have not already done so to contact the registration department to sign all necessary forms in order for us  to release information regarding their care.   Consent: Patient/Guardian gives verbal consent for treatment and assignment of benefits for services provided during this visit. Patient/Guardian expressed understanding and agreed to proceed.   Mary Koch, [facility] 10/19/2024

## 2024-10-19 NOTE — Patient Instructions (Signed)
2

## 2024-10-21 NOTE — Telephone Encounter (Signed)
 Requested medication (s) are due for refill today: Yes  Requested medication (s) are on the active medication list: Yes  Last refill:  08/24/24  Future visit scheduled: yes  Notes to clinic:  See pharmacy request.    Requested Prescriptions  Pending Prescriptions Disp Refills   WEGOVY  0.25 MG/0.5ML SOAJ SQ injection [Pharmacy Med Name: WEGOVY  0.25 MG/0.5 ML PEN]  1    Sig: INJECT 0.25MG  INTO THE SKIN ONE TIME PER WEEK     Endocrinology:  Diabetes - GLP-1 Receptor Agonists - semaglutide  Passed - 10/21/2024 12:20 PM      Passed - HBA1C in normal range and within 180 days    HbA1c POC (<> result, manual entry)  Date Value Ref Range Status  03/30/2021 5.2 4.0 - 5.6 % Final   Hgb A1c MFr Bld  Date Value Ref Range Status  06/16/2024 5.3 <5.7 % Final    Comment:    For the purpose of screening for the presence of diabetes: . <5.7%       Consistent with the absence of diabetes 5.7-6.4%    Consistent with increased risk for diabetes             (prediabetes) > or =6.5%  Consistent with diabetes . This assay result is consistent with a decreased risk of diabetes. . Currently, no consensus exists regarding use of hemoglobin A1c for diagnosis of diabetes in children. . According to American Diabetes Association (ADA) guidelines, hemoglobin A1c <7.0% represents optimal control in non-pregnant diabetic patients. Different metrics may apply to specific patient populations.  Standards of Medical Care in Diabetes(ADA). .          Passed - Cr in normal range and within 360 days    Creat  Date Value Ref Range Status  06/16/2024 0.88 0.50 - 1.03 mg/dL Final   Creatinine, Urine  Date Value Ref Range Status  06/16/2024 156 20 - 275 mg/dL Final         Passed - Valid encounter within last 6 months    Recent Outpatient Visits           1 month ago Swelling of left lower extremity   [facility] Leavy Mole, PA-C   4 months ago Encounter for medical  examination to establish care   [facility] Leavy Mole, PA-C

## 2024-10-26 ENCOUNTER — Ambulatory Visit: Admitting: Family Medicine

## 2024-10-27 ENCOUNTER — Ambulatory Visit: Admitting: Nurse Practitioner

## 2024-10-27 ENCOUNTER — Encounter: Payer: Self-pay | Admitting: Nurse Practitioner

## 2024-10-27 VITALS — BP 138/82 | HR 86 | Temp 98.3°F | Ht 66.0 in | Wt 251.0 lb

## 2024-10-27 DIAGNOSIS — F332 Major depressive disorder, recurrent severe without psychotic features: Secondary | ICD-10-CM | POA: Diagnosis not present

## 2024-10-27 DIAGNOSIS — M7989 Other specified soft tissue disorders: Secondary | ICD-10-CM

## 2024-10-27 DIAGNOSIS — Z1231 Encounter for screening mammogram for malignant neoplasm of breast: Secondary | ICD-10-CM

## 2024-10-27 DIAGNOSIS — I10 Essential (primary) hypertension: Secondary | ICD-10-CM | POA: Diagnosis not present

## 2024-10-27 DIAGNOSIS — E079 Disorder of thyroid, unspecified: Secondary | ICD-10-CM

## 2024-10-27 DIAGNOSIS — R140 Abdominal distension (gaseous): Secondary | ICD-10-CM | POA: Diagnosis not present

## 2024-10-27 DIAGNOSIS — Z6838 Body mass index (BMI) 38.0-38.9, adult: Secondary | ICD-10-CM

## 2024-10-27 DIAGNOSIS — E785 Hyperlipidemia, unspecified: Secondary | ICD-10-CM

## 2024-10-27 DIAGNOSIS — M2550 Pain in unspecified joint: Secondary | ICD-10-CM

## 2024-10-27 DIAGNOSIS — E890 Postprocedural hypothyroidism: Secondary | ICD-10-CM

## 2024-10-27 DIAGNOSIS — R7303 Prediabetes: Secondary | ICD-10-CM

## 2024-10-27 DIAGNOSIS — J42 Unspecified chronic bronchitis: Secondary | ICD-10-CM

## 2024-10-27 DIAGNOSIS — Z130 Encounter for screening for diseases of the blood and blood-forming organs and certain disorders involving the immune mechanism: Secondary | ICD-10-CM

## 2024-10-27 DIAGNOSIS — F419 Anxiety disorder, unspecified: Secondary | ICD-10-CM

## 2024-10-27 DIAGNOSIS — Z6841 Body Mass Index (BMI) 40.0 and over, adult: Secondary | ICD-10-CM

## 2024-10-27 DIAGNOSIS — K219 Gastro-esophageal reflux disease without esophagitis: Secondary | ICD-10-CM

## 2024-10-27 MED ORDER — OZEMPIC (0.25 OR 0.5 MG/DOSE) 2 MG/3ML ~~LOC~~ SOPN: 0.2500 mg | PEN_INJECTOR | SUBCUTANEOUS | 0 refills | Status: DC

## 2024-10-27 MED ORDER — OMEPRAZOLE 40 MG PO CPDR: 40.0000 mg | DELAYED_RELEASE_CAPSULE | Freq: Two times a day (BID) | ORAL | 3 refills | Status: AC

## 2024-10-27 MED ORDER — FLUTICASONE FUROATE-VILANTEROL 100-25 MCG/ACT IN AEPB: 1.0000 | INHALATION_SPRAY | Freq: Every day | RESPIRATORY_TRACT | 4 refills | Status: AC

## 2024-10-27 MED ORDER — MELOXICAM 7.5 MG PO TABS: 7.5000 mg | ORAL_TABLET | Freq: Every day | ORAL | 3 refills | Status: AC

## 2024-10-27 NOTE — Assessment & Plan Note (Signed)
 Continue taking albuterol  inhaler daily and PRN.

## 2024-10-27 NOTE — Assessment & Plan Note (Signed)
 Continue taking Rosuvastatin  20 mg daily. Labs ordered. Continue lifestyle modifications including eating a healthy diet and exercising.

## 2024-10-27 NOTE — Progress Notes (Signed)
 BP 138/82   Pulse 86   Temp 98.3 F (36.8 C)   Ht 5' 6 (1.676 m)   Wt 251 lb (113.9 kg)   SpO2 98%   BMI 40.51 kg/m    Subjective:    Patient ID: Mary Koch, female    DOB: 1971/09/22, 53 y.o.   MRN: [id number]  HPI: Mary Koch is a 53 y.o. female  Chief Complaint  Patient presents with   Medical Management of Chronic Issues    Would like to discuss starting ozempic     Hypertension: -Medications: Propranolol  60 mg BID -Patient is compliant with above medications and reports no side effects. -Does not check blood pressure regularly at home. -Denies any SOB, CP, vision changes, LE edema or symptoms of hypotension  HLD: -Medications: Rosuvastatin  20 mg daily -Patient is compliant with above medications and reports no side effects.  -Last lipid panel: 06/16/2024  Lipid Panel     Component Value Date/Time   CHOL 149 06/16/2024 1424   TRIG 278 (H) 06/16/2024 1424   HDL 28 (L) 06/16/2024 1424   CHOLHDL 5.3 (H) 06/16/2024 1424   LDLCALC 84 06/16/2024 1424   Major depressive disorder/Insomnia: -Medications: Wellbutrin  100 mg daily, Trazodone  50 mg PRN -Patient is compliant with above medications and reports no side effects. -Followed by psychiatry. -Endorses staying depressed, but verbalizes that medications are effective in managing her depression. -Has difficulty sleeping and wakes up several times throughout the night. Verbalizes that PRN Klonopin  0.5 mg is effective managing her insomnia.  Anxiety/Panic Disorder/Agoraphobia: -Medications: Cymbalta  80 mg daily, Klonopin  0.5 mg daily PRN -Patient is compliant with above medications and reports no side effects. -Followed by psychiatry. -Does endorse feeling anxious, but verbalizes that medications manage her symptoms well.  Obesity: -Patient requests if weight loss injectable medication Ozempic  could be ordered for obesity. Was previously on Ozempic  by another practice several years ago, but stopped  taking due to GI upset. Would like to start again at lower dose. Insurance would not cover Wegovy . -Patient is compliant with above medications and reports no side effects. -Diet: Tries to consume healthy diet, but endorses feeling bloated at times. -Exercise: Walks 3 to 4 times per week - Encourage continuation of lifestyle modifications, including dietary management and regular exercise. -continue to increase physical activity, getting at least 150 min of physical activity a week.  Work on including runner, broadcasting/film/video 2 days a week.  - continue eating at a calorie deficit 1600-1700 cal a day, eating a well balanced diet with whole foods, avoiding processed foods.   Patient is motivated to continue working on lifestyle modification.    History of Prediabetes: -Last A1c: 5.3 (06/16/2024) -Patient is compliant with the above medications and reports no side effects.  -Does not check blood glucose at home. -Microalbumin: Last completed 06/16/2024 -Denies symptoms of hypoglycemia, polyuria, polydipsia, numbness extremities, foot ulcers/trauma.   Partial Thyroidectomy: -Does not taken any medication. -Last TSH: 1.16 (06/16/2024) -Denies symptoms of fatigue, cold intolerance, or dry coarse skin.  GERD: -Medications: Omeprazole  40 mg daily -Patient is compliant with above medications and reports no side effects. -Denies chest discomfort, but endorses intermittent acid reflux. Discussed about changing Omeprazole  from daily to twice daily.  Tobacco Use Disorder: -Medications: Varenicline  1 mg BID -Patient is compliant with above medications and reports no side effects. -Endorses intermittent shortness of breath when smoking. -Has decreased smoking from 2 packs per day to 1/2 a pack per day.  Chronic joint pain: -Endorses chronic joint  pain in elbows, knees, and ankles. Request if Meloxicam  can be reordered.  -Patient verbalizes that was previously worked up for rheumatoid arthritis and  autoimmune disorders at previous office.  Lower extremity edema: -Endorses lower extremity edema in bilateral lower legs that is worse in left leg. An ultrasound was previously ordered and negative for DVT. Denies any pain or redness. Discussed about ordering labs and possible referral to vascular.   PHQ-9 is positive for depression. Managed well with medications.    10/27/2024    2:40 PM 06/24/2024   11:34 AM 06/16/2024    1:30 PM  Depression screen PHQ 2/9  Decreased Interest 3 0 3  Down, Depressed, Hopeless 2 2 3   PHQ - 2 Score 5 2 6   Altered sleeping 3 0 0  Tired, decreased energy 3 2 3   Change in appetite 3 0 3  Feeling bad or failure about yourself  1 0 3  Trouble concentrating 3 0 3  Moving slowly or fidgety/restless 0 0 0  Suicidal thoughts 0 0 0  PHQ-9 Score 18 4 18   Difficult doing work/chores Very difficult Somewhat difficult        09/28/2024    2:07 PM 06/16/2024    1:31 PM 05/11/2024    1:09 PM 10/16/2023   11:59 AM  GAD 7 : Generalized Anxiety Score  Nervous, Anxious, on Edge  2    Control/stop worrying  3    Worry too much - different things  3    Trouble relaxing  2    Restless  3    Easily annoyed or irritable  2    Afraid - awful might happen  3    Total GAD 7 Score  18    Anxiety Difficulty         Information is confidential and restricted. Go to Review Flowsheets to unlock data.      Relevant past medical, surgical, family and social history reviewed and updated as indicated. Interim medical history since our last visit reviewed. Allergies and medications reviewed and updated.  Review of Systems  Constitutional: Negative for fever or weight change.  Respiratory: Negative for cough. Only endorses shortness of breath when smoking. Cardiovascular: Negative for chest pain or palpitations.  Gastrointestinal: Negative for abdominal pain, no bowel changes.  Musculoskeletal: Negative for gait problem or joint swelling. Endorses chronic joint  pain. Skin: Negative for rash.  Neurological: Negative for dizziness or headache.  No other specific complaints in a complete review of systems (except as listed in HPI above).     Objective:     BP 138/82   Pulse 86   Temp 98.3 F (36.8 C)   Ht 5' 6 (1.676 m)   Wt 251 lb (113.9 kg)   SpO2 98%   BMI 40.51 kg/m    Wt Readings from Last 3 Encounters:  10/27/24 251 lb (113.9 kg)  09/06/24 245 lb (111.1 kg)  08/24/24 241 lb (109.3 kg)    Physical Exam Constitutional:      Appearance: Normal appearance.  Cardiovascular:     Rate and Rhythm: Normal rate and regular rhythm.     Heart sounds: Normal heart sounds.  Pulmonary:     Effort: Pulmonary effort is normal.     Breath sounds: Normal breath sounds.  Musculoskeletal:     Right lower leg: Edema present.     Left lower leg: 1+ Edema present.     Comments: Non-pitting edema in RLE  Skin:    General:  Skin is warm and dry.  Neurological:     General: No focal deficit present.     Mental Status: She is alert and oriented to person, place, and time. Mental status is at baseline.  Psychiatric:        Mood and Affect: Mood normal.        Behavior: Behavior normal.        Thought Content: Thought content normal.        Judgment: Judgment normal.     Results for orders placed or performed in visit on 06/16/24  HgB A1c   Collection Time: 06/16/24  2:24 PM  Result Value Ref Range   Hgb A1c MFr Bld 5.3 <5.7 %   Mean Plasma Glucose 105 mg/dL   eAG (mmol/L) 5.8 mmol/L  Comprehensive Metabolic Panel (CMET)   Collection Time: 06/16/24  2:24 PM  Result Value Ref Range   Glucose, Bld 81 65 - 99 mg/dL   BUN 10 7 - 25 mg/dL   Creat 9.11 9.49 - 8.96 mg/dL   eGFR 80 > OR = 60 fO/fpw/8.26f7   BUN/Creatinine Ratio SEE NOTE: 6 - 22 (calc)   Sodium 143 135 - 146 mmol/L   Potassium 4.5 3.5 - 5.3 mmol/L   Chloride 107 98 - 110 mmol/L   CO2 28 20 - 32 mmol/L   Calcium  9.9 8.6 - 10.4 mg/dL   Total Protein 6.8 6.1 - 8.1 g/dL    Albumin 4.2 3.6 - 5.1 g/dL   Globulin 2.6 1.9 - 3.7 g/dL (calc)   AG Ratio 1.6 1.0 - 2.5 (calc)   Total Bilirubin 0.3 0.2 - 1.2 mg/dL   Alkaline phosphatase (APISO) 77 37 - 153 U/L   AST 13 10 - 35 U/L   ALT 16 6 - 29 U/L  Urine Microalbumin w/creat. ratio   Collection Time: 06/16/24  2:24 PM  Result Value Ref Range   Creatinine, Urine 156 20 - 275 mg/dL   Microalb, Ur 1.0 mg/dL   Microalb Creat Ratio 6 <30 mg/g creat  CBC with Differential/Platelet   Collection Time: 06/16/24  2:24 PM  Result Value Ref Range   WBC 12.0 (H) 3.8 - 10.8 Thousand/uL   RBC 5.16 (H) 3.80 - 5.10 Million/uL   Hemoglobin 14.6 11.7 - 15.5 g/dL   HCT 53.8 (H) 64.9 - 54.9 %   MCV 89.3 80.0 - 100.0 fL   MCH 28.3 27.0 - 33.0 pg   MCHC 31.7 (L) 32.0 - 36.0 g/dL   RDW 86.8 88.9 - 84.9 %   Platelets 287 140 - 400 Thousand/uL   MPV 10.1 7.5 - 12.5 fL   Neutro Abs 7,020 1,500 - 7,800 cells/uL   Absolute Lymphocytes 3,360 850 - 3,900 cells/uL   Absolute Monocytes 660 200 - 950 cells/uL   Eosinophils Absolute 912 (H) 15 - 500 cells/uL   Basophils Absolute 48 0 - 200 cells/uL   Neutrophils Relative % 58.5 %   Total Lymphocyte 28.0 %   Monocytes Relative 5.5 %   Eosinophils Relative 7.6 %   Basophils Relative 0.4 %  HIV antibody (with reflex)   Collection Time: 06/16/24  2:24 PM  Result Value Ref Range   HIV 1&2 Ab, 4th Generation NON-REACTIVE NON-REACTIVE  Hepatitis C Antibody   Collection Time: 06/16/24  2:24 PM  Result Value Ref Range   Hepatitis C Ab NON-REACTIVE NON-REACTIVE  Lipid Profile   Collection Time: 06/16/24  2:24 PM  Result Value Ref Range   Cholesterol 149 <200  mg/dL   HDL 28 (L) > OR = 50 mg/dL   Triglycerides 721 (H) <150 mg/dL   LDL Cholesterol (Calc) 84 mg/dL (calc)   Total CHOL/HDL Ratio 5.3 (H) <5.0 (calc)   Non-HDL Cholesterol (Calc) 121 <130 mg/dL (calc)  Celiac Disease Panel   Collection Time: 06/16/24  2:24 PM  Result Value Ref Range   Immunoglobulin A 348 (H) 47 - 310  mg/dL   Gliadin IgA 1.4 U/mL   Gliadin IgG <1.0 U/mL   (tTG) Ab, IgG <1.0 U/mL   (tTG) Ab, IgA <1.0 U/mL  TSH   Collection Time: 06/16/24  2:24 PM  Result Value Ref Range   TSH 1.16 mIU/L          Assessment & Plan:   Problem List Items Addressed This Visit       Cardiovascular and Mediastinum   Essential hypertension   Continue taking Propranolol  for HTN. Labs ordered.        Respiratory   Chronic bronchitis, unspecified chronic bronchitis type (HCC)   Continue taking albuterol  inhaler daily and PRN.       Relevant Medications   fluticasone  furoate-vilanterol (BREO ELLIPTA ) 100-25 MCG/ACT AEPB     Digestive   Acid reflux   Endorses acid reflux. Omeprazole  40 mg changed from daily to BID.      Relevant Medications   omeprazole  (PRILOSEC) 40 MG capsule     Endocrine   Disease of thyroid  gland   Does not take any medication for thyroid . Labs ordered.        Other   Anxiety   Continue taking Cymbalta  80 mg daily and Klonopin  0.5 mg daily PRN. Followed by psych. Anxiety managed well with medication.      HLD (hyperlipidemia) - Primary   Continue taking Rosuvastatin  20 mg daily. Labs ordered. Continue lifestyle modifications including eating a healthy diet and exercising.      Relevant Orders   Lipid panel   Class 2 severe obesity with serious comorbidity and body mass index (BMI) of 38.0 to 38.9 in adult   Ozempic  ordered. Was previously on Ozempic  by another practice several years ago, but stopped taking due to GI upset. Would like to start again at lower dose. Insurance would not cover Wegovy . Continue lifestyle modifications including eating a well-balanced diet with a variety of vegetables, fruits, and whole grains. Measure serving and portion sizes. Perform at least 150 minutes of moderate intensity exercise weekly with 2 days of weight bearing exercises.      Relevant Medications   Semaglutide ,0.25 or 0.5MG /DOS, (OZEMPIC , 0.25 OR 0.5 MG/DOSE,) 2  MG/3ML SOPN   Other Relevant Orders   Cortisol   Prediabetes   Relevant Orders   Comprehensive metabolic panel with GFR   Hemoglobin A1c   Severe episode of recurrent major depressive disorder, without psychotic features (HCC)   Managed by psychiatry.  -Medications: Wellbutrin  100 mg daily, Trazodone  50 mg PRN -Patient is compliant with above medications and reports no side effects. -Followed by psychiatry. -Endorses staying depressed, but verbalizes that medications are effective in managing her depression. -Has difficulty sleeping and wakes up several times throughout the night. Verbalizes that PRN Klonopin  0.5 mg is effective managing her insomnia.      Other Visit Diagnoses       H/O partial thyroidectomy       Relevant Orders   TSH     Screening for deficiency anemia       Relevant Orders   CBC with Differential/Platelet  Screening mammogram for breast cancer       Relevant Orders   MM 3D SCREENING MAMMOGRAM BILATERAL BREAST     Arthralgia, unspecified joint       Meloxicam  ordered for chronic joint pain.   Relevant Medications   meloxicam  (MOBIC ) 7.5 MG tablet     Swelling of left lower extremity       Previoulsy received ultrasound that was negative for DVT. Continues to having bilateral swelling that is worse in left leg. Labs ordered.   Relevant Orders   Brain natriuretic peptide     Postprandial bloating       bloating discomfort and often diarrhea with eating, celiac panel with labs, f/up GI for further eval, could do elimination diets/low fodmap   Relevant Medications   omeprazole  (PRILOSEC) 40 MG capsule          Follow up plan: Return in about 3 months (around 01/27/2025) for follow up.   I have reviewed this encounter including the documentation in this note and/or discussed this patient with the provider, Alexa Everhart SNP, I am certifying that I agree with the content of this note as supervising/preceptor nurse practitioner.  Mliss Spray,  FNP-C [facility] 10/27/2024, 4:33 PM

## 2024-10-27 NOTE — Assessment & Plan Note (Signed)
 Continue taking Cymbalta  80 mg daily and Klonopin  0.5 mg daily PRN. Followed by psych. Anxiety managed well with medication.

## 2024-10-27 NOTE — Assessment & Plan Note (Signed)
 Managed by psychiatry.  -Medications: Wellbutrin  100 mg daily, Trazodone  50 mg PRN -Patient is compliant with above medications and reports no side effects. -Followed by psychiatry. -Endorses staying depressed, but verbalizes that medications are effective in managing her depression. -Has difficulty sleeping and wakes up several times throughout the night. Verbalizes that PRN Klonopin  0.5 mg is effective managing her insomnia.

## 2024-10-27 NOTE — Assessment & Plan Note (Signed)
 Does not take any medication for thyroid . Labs ordered.

## 2024-10-27 NOTE — Assessment & Plan Note (Signed)
 Ozempic  ordered. Was previously on Ozempic  by another practice several years ago, but stopped taking due to GI upset. Would like to start again at lower dose. Insurance would not cover Wegovy . Continue lifestyle modifications including eating a well-balanced diet with a variety of vegetables, fruits, and whole grains. Measure serving and portion sizes. Perform at least 150 minutes of moderate intensity exercise weekly with 2 days of weight bearing exercises.

## 2024-10-27 NOTE — Assessment & Plan Note (Signed)
 Endorses acid reflux. Omeprazole  40 mg changed from daily to BID.

## 2024-10-27 NOTE — Assessment & Plan Note (Signed)
 Continue taking Propranolol  for HTN. Labs ordered.

## 2024-10-28 LAB — COMPREHENSIVE METABOLIC PANEL WITH GFR
AG Ratio: 1.7 (calc) (ref 1.0–2.5)
ALT: 19 U/L (ref 6–29)
AST: 13 U/L (ref 10–35)
Albumin: 4.2 g/dL (ref 3.6–5.1)
Alkaline phosphatase (APISO): 85 U/L (ref 37–153)
BUN: 7 mg/dL (ref 7–25)
CO2: 29 mmol/L (ref 20–32)
Calcium: 9.9 mg/dL (ref 8.6–10.4)
Chloride: 106 mmol/L (ref 98–110)
Creat: 0.82 mg/dL (ref 0.50–1.03)
Globulin: 2.5 g/dL (ref 1.9–3.7)
Glucose, Bld: 75 mg/dL (ref 65–99)
Potassium: 4.2 mmol/L (ref 3.5–5.3)
Sodium: 145 mmol/L (ref 135–146)
Total Bilirubin: 0.4 mg/dL (ref 0.2–1.2)
Total Protein: 6.7 g/dL (ref 6.1–8.1)
eGFR: 87 mL/min/1.73m2 (ref >=60)

## 2024-10-28 LAB — CBC WITH DIFFERENTIAL/PLATELET
Absolute Lymphocytes: 3063 {cells}/uL (ref 850–3900)
Absolute Monocytes: 918 {cells}/uL (ref 200–950)
Basophils Absolute: 62 {cells}/uL (ref 0–200)
Basophils Relative: 0.5 %
Eosinophils Absolute: 595 {cells}/uL — ABNORMAL HIGH (ref 15–500)
Eosinophils Relative: 4.8 %
HCT: 45.8 % — ABNORMAL HIGH (ref 35.0–45.0)
Hemoglobin: 14.9 g/dL (ref 11.7–15.5)
MCH: 28.6 pg (ref 27.0–33.0)
MCHC: 32.5 g/dL (ref 32.0–36.0)
MCV: 87.9 fL (ref 80.0–100.0)
MPV: 10.3 fL (ref 7.5–12.5)
Monocytes Relative: 7.4 %
Neutro Abs: 7762 {cells}/uL (ref 1500–7800)
Neutrophils Relative %: 62.6 %
Platelets: 302 Thousand/uL (ref 140–400)
RBC: 5.21 Million/uL — ABNORMAL HIGH (ref 3.80–5.10)
RDW: 12.8 % (ref 11.0–15.0)
Total Lymphocyte: 24.7 %
WBC: 12.4 Thousand/uL — ABNORMAL HIGH (ref 3.8–10.8)

## 2024-10-28 LAB — HEMOGLOBIN A1C
Hgb A1c MFr Bld: 5.3 % (ref <5.7)
Mean Plasma Glucose: 105 mg/dL
eAG (mmol/L): 5.8 mmol/L

## 2024-10-28 LAB — LIPID PANEL
Cholesterol: 163 mg/dL (ref <200)
HDL: 27 mg/dL — ABNORMAL LOW (ref >=50)
LDL Cholesterol (Calc): 96 mg/dL
Non-HDL Cholesterol (Calc): 136 mg/dL — ABNORMAL HIGH (ref <130)
Total CHOL/HDL Ratio: 6 (calc) — ABNORMAL HIGH (ref <5.0)
Triglycerides: 297 mg/dL — ABNORMAL HIGH (ref <150)

## 2024-10-28 LAB — BRAIN NATRIURETIC PEPTIDE: Brain Natriuretic Peptide: 8 pg/mL (ref <100)

## 2024-10-28 LAB — TSH: TSH: 0.72 m[IU]/L

## 2024-10-28 LAB — CORTISOL: Cortisol, Plasma: 8 ug/dL

## 2024-10-29 ENCOUNTER — Ambulatory Visit: Payer: Self-pay | Admitting: Nurse Practitioner

## 2024-11-02 ENCOUNTER — Ambulatory Visit (INDEPENDENT_AMBULATORY_CARE_PROVIDER_SITE_OTHER): Admitting: Professional Counselor

## 2024-11-02 DIAGNOSIS — F410 Panic disorder [episodic paroxysmal anxiety] without agoraphobia: Secondary | ICD-10-CM | POA: Diagnosis not present

## 2024-11-02 DIAGNOSIS — F331 Major depressive disorder, recurrent, moderate: Secondary | ICD-10-CM | POA: Diagnosis not present

## 2024-11-02 DIAGNOSIS — F1590 Other stimulant use, unspecified, uncomplicated: Secondary | ICD-10-CM | POA: Diagnosis not present

## 2024-11-02 DIAGNOSIS — F4000 Agoraphobia, unspecified: Secondary | ICD-10-CM

## 2024-11-02 DIAGNOSIS — F17200 Nicotine dependence, unspecified, uncomplicated: Secondary | ICD-10-CM | POA: Diagnosis not present

## 2024-11-02 NOTE — Progress Notes (Signed)
 THERAPIST PROGRESS NOTE  Session Time: 2:00 PM - 2:45 PM   Participation Level: Active  Behavioral Response: Casual, Alert, Anxious  Type of Therapy: Individual Therapy  Treatment Goals addressed: Active OP Anxiety  LTG: I want to be able to go out in public, take my yougins places. Anxiety and panic attacks. (Progressing)                Start:  06/21/24    Expected End:  06/20/25    Goal Note Continues to be relevant goal to work on   STG: To reduce symptoms of anxiety AEB reduction in GAD7 scores by using coping mechanisms over the next 90 days.  (Progressing)  Goal Note Reviewed 09/28/24 - Right now it's rough. I've been waking up around 1-3 in the morning. I feel like I can't even breath. GAD7 score reduced by 2 points but is still in severe range   STG: I want to go grocery shopping without worrying about someone staring or judging you. To increase daily activities AEB restructuring maladaptive patterns of thinking and exposure exercises over the next 90 days.  (Progressing)  Goal Note Reviewed 09/28/24 - You've helped me, them breathing exercises, like blowing out the candles, helps a whole lot. I guess I need to do it more.   STG: Well you can't bring my brother and my daddy back. To reduce impact of grief/loss AEB movement through stages of grief and coming to a place of acceptance over the next 12 weeks.  (Not Progressing)  Goal Note Reviewed 09/28/24 - That needs more work.   ProgressTowards Goals: Progressing  Interventions: CBT, Motivational Interviewing, and Supportive  Summary: Modean Tyquasia Pant is a 53 y.o. female who presents with a history of depression, panic disorder, agoraphobia, tobacco and caffeine use disorder. She appeared alert and oriented x5. She stated she hasn't done much since last session. She wasn't able to engage in any exposure exercises. Corby reported increased anxiety around death and waking up at night in a panic. She was receptive to  increasing her practice of coping skills and tips to improving consistency. Matsue reported her cortisol levels were normal when checked but she still wonders about menopause. She engaged in discussion about caffeine use and explored ways to increase water consumption. She noted she was supposed to start Ozempic  this week and she can also try this in the morning.   Therapist Response: Conducted session with [name]. Began session with check-in/update since previous session. Utilized empathetic and reflective listening. Used open-ended questions to facilitate discussion and summarized Miriya's thoughts/feelings. Explored thoughts around death. Provided psychoeducation and reminded Christell of importance of grounding techniques. Explored ways to improve consistent practice. Discussed potential health factors contributing to nighttime panic attacks. Reinforced change talk surrounding caffeine use, water consumption, and medication changes. Scheduled additional appointment and concluded session.   Suicidal/Homicidal: No  Plan: Return again in 2 weeks.  Diagnosis: Agoraphobia  Panic disorder  MDD (major depressive disorder), recurrent episode, moderate (HCC)  Tobacco use disorder  Caffeine use disorder  Collaboration of Care: Medication Management AEB chart review and Primary Care Provider AEB chart review  Patient/Guardian was advised Release of Information must be obtained prior to any record release in order to collaborate their care with an outside provider. Patient/Guardian was advised if they have not already done so to contact the registration department to sign all necessary forms in order for us  to release information regarding their care.   Consent: Patient/Guardian gives verbal consent for treatment  and assignment of benefits for services provided during this visit. Patient/Guardian expressed understanding and agreed to proceed.   [name], [facility] 11/02/2024

## 2024-11-04 ENCOUNTER — Other Ambulatory Visit: Payer: Self-pay | Admitting: Psychiatry

## 2024-11-04 DIAGNOSIS — F410 Panic disorder [episodic paroxysmal anxiety] without agoraphobia: Secondary | ICD-10-CM

## 2024-11-17 ENCOUNTER — Ambulatory Visit (INDEPENDENT_AMBULATORY_CARE_PROVIDER_SITE_OTHER): Admitting: Professional Counselor

## 2024-11-17 DIAGNOSIS — F410 Panic disorder [episodic paroxysmal anxiety] without agoraphobia: Secondary | ICD-10-CM | POA: Diagnosis not present

## 2024-11-17 DIAGNOSIS — F17200 Nicotine dependence, unspecified, uncomplicated: Secondary | ICD-10-CM

## 2024-11-17 DIAGNOSIS — F331 Major depressive disorder, recurrent, moderate: Secondary | ICD-10-CM | POA: Diagnosis not present

## 2024-11-17 DIAGNOSIS — F4000 Agoraphobia, unspecified: Secondary | ICD-10-CM

## 2024-11-17 NOTE — Progress Notes (Signed)
  THERAPIST PROGRESS NOTE  Session Time: 2:00 PM - 2:48 PM   Participation Level: Active  Behavioral Response: Casual, Alert, Anxious and Dysphoric  Type of Therapy: Individual Therapy  Treatment Goals addressed:  Active OP Anxiety  LTG: I want to be able to go out in public, take my yougins places. Anxiety and panic attacks. (Progressing)                Start:  06/21/24    Expected End:  06/20/25    Goal Note Continues to be relevant goal to work on   STG: To reduce symptoms of anxiety AEB reduction in GAD7 scores by using coping mechanisms over the next 90 days.  (Progressing)  Goal Note Reviewed 09/28/24 - Right now it's rough. I've been waking up around 1-3 in the morning. I feel like I can't even breath. GAD7 score reduced by 2 points but is still in severe range   STG: I want to go grocery shopping without worrying about someone staring or judging you. To increase daily activities AEB restructuring maladaptive patterns of thinking and exposure exercises over the next 90 days.  (Progressing)  Goal Note Reviewed 09/28/24 - You've helped me, them breathing exercises, like blowing out the candles, helps a whole lot. I guess I need to do it more.   STG: Well you can't bring my brother and my daddy back. To reduce impact of grief/loss AEB movement through stages of grief and coming to a place of acceptance over the next 12 weeks.  (Not Progressing)  Goal Note Reviewed 09/28/24 - That needs more work.   ProgressTowards Goals: Progressing  Interventions: Motivational Interviewing, Solution Focused, and Supportive  Summary: Stephanieann Ashiah Karpowicz is a 53 y.o. female who presents with a history of anxiety, depression, agoraphobia, and panic disorder. She appeared somber but oriented x5. She reported things haven't really changed. She agreed she would like things to change, noting she would like to have a relationship and know what real love is. Takerra engaged in discussion about ways to  work towards this goal. She took note of ways to practice engaging with other or making connections. She also will research other housing options.  Therapist Response: Conducted session with [name]. Began session with check-in/update since previous session. Utilized empathetic and reflective listening. Used open-ended questions to facilitate discussion and summarized Jordana's thoughts/feelings. Explored ways to build connections and encouraged Laveyah to focus on the small action steps rather than the long-term goal. Identified potential housing options and plans for obtaining own residence. Scheduled additional appointment and concluded session.   Suicidal/Homicidal: No  Plan: Return again in 2 weeks.  Diagnosis: MDD (major depressive disorder), recurrent episode, moderate (HCC)  Agoraphobia  Panic disorder  Tobacco use disorder  Collaboration of Care: Medication Management AEB chart review  Patient/Guardian was advised Release of Information must be obtained prior to any record release in order to collaborate their care with an outside provider. Patient/Guardian was advised if they have not already done so to contact the registration department to sign all necessary forms in order for us  to release information regarding their care.   Consent: Patient/Guardian gives verbal consent for treatment and assignment of benefits for services provided during this visit. Patient/Guardian expressed understanding and agreed to proceed.   [name], [facility] 11/17/2024

## 2024-11-18 ENCOUNTER — Other Ambulatory Visit: Payer: Self-pay

## 2024-11-18 ENCOUNTER — Encounter: Payer: Self-pay | Admitting: Psychiatry

## 2024-11-18 ENCOUNTER — Ambulatory Visit (INDEPENDENT_AMBULATORY_CARE_PROVIDER_SITE_OTHER): Admitting: Psychiatry

## 2024-11-18 VITALS — BP 138/78 | HR 91 | Temp 97.6°F | Ht 66.0 in | Wt 245.2 lb

## 2024-11-18 DIAGNOSIS — F17200 Nicotine dependence, unspecified, uncomplicated: Secondary | ICD-10-CM

## 2024-11-18 DIAGNOSIS — F3341 Major depressive disorder, recurrent, in partial remission: Secondary | ICD-10-CM | POA: Insufficient documentation

## 2024-11-18 DIAGNOSIS — F3342 Major depressive disorder, recurrent, in full remission: Secondary | ICD-10-CM

## 2024-11-18 DIAGNOSIS — Z634 Disappearance and death of family member: Secondary | ICD-10-CM | POA: Diagnosis not present

## 2024-11-18 DIAGNOSIS — G4701 Insomnia due to medical condition: Secondary | ICD-10-CM

## 2024-11-18 DIAGNOSIS — F1590 Other stimulant use, unspecified, uncomplicated: Secondary | ICD-10-CM | POA: Diagnosis not present

## 2024-11-18 DIAGNOSIS — F410 Panic disorder [episodic paroxysmal anxiety] without agoraphobia: Secondary | ICD-10-CM | POA: Diagnosis not present

## 2024-11-18 DIAGNOSIS — F4000 Agoraphobia, unspecified: Secondary | ICD-10-CM

## 2024-11-18 MED ORDER — DULOXETINE HCL 60 MG PO CPEP: ORAL_CAPSULE | ORAL | 1 refills | Status: AC

## 2024-11-18 MED ORDER — DULOXETINE HCL 20 MG PO CPEP: 20.0000 mg | ORAL_CAPSULE | Freq: Every day | ORAL | 1 refills | Status: AC

## 2024-11-18 NOTE — Progress Notes (Signed)
 [facility] MD OP Progress Note  11/18/2024 4:02 PM Mary Koch  MRN:  [id number]  Chief Complaint:  Chief Complaint  Patient presents with   Follow-up   Depression   Anxiety   Medication Refill   Discussed the use of AI scribe software for clinical note transcription with the patient, who gave verbal consent to proceed.  History of Present Illness Mary Koch is a 53 year old Caucasian female, lives in [place], has a history of MDD, panic disorder, agoraphobia, insomnia, tobacco use disorder, caffeine use disorder was evaluated in office today for a follow-up appointment.  Ongoing grief related to the recent loss of her brother continues, with this Thanksgiving marking the first holiday without him. She finds it difficult to cope with his passing and expresses challenges in accepting the loss, especially during the holidays. Maintaining contact with her brother's daughter, she tries to provide emotional support. She attends therapy sessions with Ms. Almarie Ligas and describes these visits as going well.  She experiences anxiety attacks once or twice a month, typically between 1:30 and 3:30 AM. She wakes from sleep experiencing intense anxiety, shortness of breath, and heart palpitations. During these episodes, she takes clonazepam  as prescribed on an as-needed basis. She notes that her sleep remains good except for these episodes.  Persistent worries about death affect her, and she feels afraid that something awful might happen. She expresses fear of dying, particularly after observing several people she knows pass away suddenly.  She denies any significant depression symptoms although she does report she continues to grieve.  Especially with the holidays coming up the grief does cause sadness and low mood. She reports low appetite, eating only once a Mary, and notes that this has been her habit for a long time. She denies suicidal ideation and denies thoughts of harming herself or  others.  Her current medication regimen includes Cymbalta  80 mg, trazodone  50-100 mg, Wellbutrin  SR 100 mg, Rexulti  0.25 mg, and Chantix .   She reports current tobacco use, smoking approximately half a pack per Mary. She currently uses Chantix  for smoking cessation.  She lives with five indoor cats. Her son (age 88) lives with her; her older son (age 70) currently stays with her mother. She regularly cooks for family gatherings during holidays and maintains contact with her niece.     Visit Diagnosis:    ICD-10-CM   1. Recurrent major depressive disorder, in full remission  F33.42 DULoxetine  (CYMBALTA ) 20 MG capsule    2. Panic disorder  F41.0 DULoxetine  (CYMBALTA ) 60 MG capsule    3. Agoraphobia  F40.00     4. Insomnia due to medical condition  G47.01    Multifactorial including lack of sleep hygiene, pain, mood    5. Bereavement  Z63.4     6. Tobacco use disorder  F17.200    Severe    7. 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  Past trials of Paxil, Luvox, Klonopin , Zoloft, Seroquel , Wellbutrin , Ambien .  Past Medical History:  Past Medical History:  Diagnosis Date   Allergic rhinitis    Anemia    Anxiety    Anxiety and depression    Carpal tunnel syndrome    COVID-19 01/15/2020   Depression    Fibroids    GERD (gastroesophageal reflux disease)    Headache(784.0)    Headache(784.0)    Heavy periods  Hyperthyroidism    right portion of thyroid  removed   Muscle spasm    Neurological disorder    evaluation for ms   Painful menstrual periods    Shortness of breath    when i smoke a lot    Past Surgical History:  Procedure Laterality Date   BREAST BIOPSY Left    neg   carpel tunnel Left 2017   CESAREAN SECTION     times 2   CHOLECYSTECTOMY     THYROID  LOBECTOMY      Family Psychiatric History: Reviewed family psychiatric history from progress  note on 03/24/2018.  Family History:  Family History  Problem Relation Age of Onset   Osteoporosis Mother    Breast cancer Mother 61   Diabetes Father    Anxiety disorder Father    Depression Father    Post-traumatic stress disorder Father    Heart disease Father    Lung cancer Father    Anxiety disorder Sister    Alcohol abuse Brother    Colon cancer Neg Hx    Ovarian cancer Neg Hx     Social History: Reviewed social history from progress note on 03/24/2018. Social History   Socioeconomic History   Marital status: Legally Separated    Spouse name: Not on file   Number of children: 2   Years of education: Not on file   Highest education level: High school graduate  Occupational History   Not on file  Tobacco Use   Smoking status: Every Mary    Types: Cigarettes    Start date: 05/22/1986   Smokeless tobacco: Never   Tobacco comments:    Currently 1-2 cig per Mary - cut back- 03/25/23  Vaping Use   Vaping status: Never Used  Substance and Sexual Activity   Alcohol use: No    Alcohol/week: 0.0 standard drinks of alcohol   Drug use: No   Sexual activity: Yes    Partners: Male    Birth control/protection: Surgical, Injection  Other Topics Concern   Not on file  Social History Narrative   Not on file   Social Drivers of Health   Financial Resource Strain: Low Risk  (06/24/2024)   Overall Financial Resource Strain (CARDIA)    Difficulty of Paying Living Expenses: Not hard at all  Recent Concern: Financial Resource Strain - Medium Risk (05/11/2024)   Overall Financial Resource Strain (CARDIA)    Difficulty of Paying Living Expenses: Somewhat hard  Food Insecurity: No Food Insecurity (06/24/2024)   Hunger Vital Sign    Worried About Running Out of Food in the Last Year: Never true    Ran Out of Food in the Last Year: Never true  Transportation Needs: No Transportation Needs (06/24/2024)   PRAPARE - Administrator, Civil Service (Medical): No    Lack of  Transportation (Non-Medical): No  Physical Activity: Insufficiently Active (06/24/2024)   Exercise Vital Sign    Days of Exercise per Week: 3 days    Minutes of Exercise per Session: 10 min  Stress: No Stress Concern Present (06/24/2024)   [employer] of Occupational Health - Occupational Stress Questionnaire    Feeling of Stress: Only a little  Recent Concern: Stress - Stress Concern Present (05/11/2024)   [employer] of Occupational Health - Occupational Stress Questionnaire    Feeling of Stress : Very much  Social Connections: Socially Isolated (06/24/2024)   Social Connection and Isolation Panel    Frequency of Communication with Friends and  Family: Twice a week    Frequency of Social Gatherings with Friends and Family: Never    Attends Religious Services: Never    Database Administrator or Organizations: No    Attends Banker Meetings: Never    Marital Status: Separated    Allergies:  Allergies  Allergen Reactions   Azithromycin Other (See Comments)    headache   Codeine Other (See Comments)    unknown   Penicillins Other (See Comments)    headaches    Metabolic Disorder Labs: Lab Results  Component Value Date   HGBA1C 5.3 10/27/2024   MPG 105 10/27/2024   MPG 105 06/16/2024   Lab Results  Component Value Date   PROLACTIN 4.3 03/29/2022   Lab Results  Component Value Date   CHOL 163 10/27/2024   TRIG 297 (H) 10/27/2024   HDL 27 (L) 10/27/2024   CHOLHDL 6.0 (H) 10/27/2024   LDLCALC 96 10/27/2024   LDLCALC 84 06/16/2024   Lab Results  Component Value Date   TSH 0.72 10/27/2024   TSH 1.16 06/16/2024    Therapeutic Level Labs: No results found for: LITHIUM No results found for: VALPROATE No results found for: CBMZ  Current Medications: Current Outpatient Medications  Medication Sig Dispense Refill   albuterol  (PROVENTIL ) (2.5 MG/3ML) 0.083% nebulizer solution INHALE 3 ML BY NEBULIZATION EVERY 6 HOURS AS NEEDED FOR WHEEZING  OR SHORTNESS OF BREATH 150 mL 1   albuterol  (VENTOLIN  HFA) 108 (90 Base) MCG/ACT inhaler INHALE 1 TO 2 PUFFS BY MOUTH DAILY 6.7 each 5   brexpiprazole  (REXULTI ) 0.25 MG TABS tablet Take 1 tablet (0.25 mg total) by mouth daily. 30 tablet 1   buPROPion  ER (WELLBUTRIN  SR) 100 MG 12 hr tablet Take 1 tablet (100 mg total) by mouth daily with breakfast. 90 tablet 1   clonazePAM  (KLONOPIN ) 0.5 MG tablet TAKE 0.5-1 TABLET BY MOUTH DAILY AS NEEDED FOR ANXIETY. SHOULD LAST 30 DAYS , PLEASE LIMIT USE. 18 tablet 2   cyclobenzaprine (FLEXERIL) 10 MG tablet Take 10 mg by mouth 3 (three) times daily as needed for muscle spasms.     DULoxetine  (CYMBALTA ) 20 MG capsule Take 1 capsule (20 mg total) by mouth daily. Take along with 60 mg daily , total of 80 mg 90 capsule 1   DULoxetine  (CYMBALTA ) 60 MG capsule TAKE 1 CAPSULE BY MOUTH EVERY Mary, take along with 20 mg daily 90 capsule 1   fluticasone  furoate-vilanterol (BREO ELLIPTA ) 100-25 MCG/ACT AEPB Inhale 1 puff into the lungs daily. 60 each 4   gabapentin  (NEURONTIN ) 300 MG capsule Take one pill (300mg ) every morning and 2 pills (600mg ) in the evening     meloxicam  (MOBIC ) 7.5 MG tablet Take 1 tablet (7.5 mg total) by mouth daily. 90 tablet 3   omeprazole  (PRILOSEC) 40 MG capsule Take 1 capsule (40 mg total) by mouth 2 (two) times daily. 180 capsule 3   propranolol  ER (INDERAL  LA) 60 MG 24 hr capsule Take 1 capsule (60 mg total) by mouth in the morning and at bedtime. 180 capsule 1   rosuvastatin  (CRESTOR ) 20 MG tablet Take 1 tablet (20 mg total) by mouth daily. 90 tablet 3   Semaglutide ,0.25 or 0.5MG /DOS, (OZEMPIC , 0.25 OR 0.5 MG/DOSE,) 2 MG/3ML SOPN Inject 0.25 mg into the skin once a week. 3 mL 0   traZODone  (DESYREL ) 50 MG tablet TAKE 1/2 TO 1 TABLET BY MOUTH EVERY NIGHT AT BEDTIME AS NEEDED FOR SLEEP 30 tablet 5   varenicline  (CHANTIX )  1 MG tablet TAKE 1 TABLET BY MOUTH TWICE A Mary 180 tablet 0   No current facility-administered medications for this visit.      Musculoskeletal: Strength & Muscle Tone: within normal limits Gait & Station: normal Patient leans: N/A  Psychiatric Specialty Exam: Review of Systems  Psychiatric/Behavioral:  The patient is nervous/anxious.        Grief- improving    Blood pressure 138/78, pulse 91, temperature 97.6 F (36.4 C), temperature source Temporal, height 5' 6 (1.676 m), weight 245 lb 3.2 oz (111.2 kg).Body mass index is 39.58 kg/m.  General Appearance: Casual  Eye Contact:  Fair  Speech:  Clear and Coherent  Volume:  Normal  Mood:  Anxious and Grief  Affect:  Congruent  Thought Process:  Goal Directed and Descriptions of Associations: Intact  Orientation:  Full (Time, Place, and Person)  Thought Content: Logical   Suicidal Thoughts:  No  Homicidal Thoughts:  No  Memory:  Immediate;   Fair Recent;   Fair Remote;   Fair  Judgement:  Fair  Insight:  Fair  Psychomotor Activity:  Normal  Concentration:  Concentration: Fair and Attention Span: Fair  Recall:  [employer] of Knowledge: Fair  Language: Fair  Akathisia:  No  Handed:  Right  AIMS (if indicated): done  Assets:  Communication Skills Desire for Improvement Housing Social Support Transportation  ADL's:  Intact  Cognition: WNL  Sleep:  Fair   Screenings: Geneticist, Molecular Office Visit from 11/18/2024 in [facility] Video Visit from 08/22/2022 in [facility] Video Visit from 05/22/2022 in [facility]  AIMS Total Score 0 0 0   AUDIT    Flowsheet Row Office Visit from 05/22/2016 in [facility]  Alcohol Use Disorder Identification Test Final Score (AUDIT) 0   GAD-7    Flowsheet Row Office Visit from 11/18/2024 in [facility] Counselor from 09/28/2024 in [facility] Office Visit from  06/16/2024 in [facility] Counselor from 05/11/2024 in [facility] Office Visit from 10/16/2023 in [facility]  Total GAD-7 Score 15 16 18 14 16    Mini-Mental    Flowsheet Row Clinical Support from 09/27/2021 in [facility]  Total Score (max 30 points ) 29   PHQ2-9    Flowsheet Row Office Visit from 11/18/2024 in [facility] Office Visit from 10/27/2024 in [facility] Clinical Support from 06/24/2024 in [facility] Office Visit from 06/16/2024 in [facility] Counselor from 05/11/2024 in [facility]  PHQ-2 Total Score 5 5 2 6 5   PHQ-9 Total Score 17 18 4 18 18    Flowsheet Row Office Visit from 11/18/2024 in [facility] Video Visit from 08/16/2024 in [facility] Video Visit from 06/04/2024 in [facility]  C-SSRS RISK CATEGORY Moderate Risk Moderate Risk Moderate Risk     Assessment and Plan: Mary Koch is a 53 year old Caucasian female who presented for a follow-up appointment.  Discussed assessment and plan as noted below.  1. Recurrent major depressive disorder, in full remission Currently reports she feels sad mostly because of her grief with the holidays coming up.  Currently in grief therapy for the same. Continue Clonazepam  0.5 mg as needed, uses it rarely Continue Cymbalta  80 mg daily Continue Wellbutrin  SR 100 mg daily Continue Rexulti  0.25 mg daily Continue psychotherapy sessions with Ms. Veva  2. Panic disorder-improving Self panic symptoms couple of times a month only. Encouraged to continue CBT Continue Clonazepam  0.5 mg as needed. Reviewed [place] PMP  AWARxE   3. Agoraphobia  Currently reports improvement. Continue psychotherapy sessions with Ms. Veva  4. Insomnia due to medical condition stable Reports sleep is overall good. Continue Trazodone  50 to 100 mg at bedtime as needed Continue sleep hygiene techniques  5. Bereavement-unstable Grief has been getting worse with the holidays coming up. Provided brief counseling. Patient to engage in psychotherapy.  6. Tobacco use disorder-unstable Continues to smoke half pack of cigarettes per Mary Continue Chantix  1 mg twice daily  7. Caffeine use disorder-improving Reports cutting back. Encouraged to continue to work on it.  Follow-up Follow-up in clinic in 3 months or sooner if needed.    Collaboration of Care: Collaboration of Care: Referral or follow-up with counselor/therapist AEB patient encouraged to continue psychotherapy sessions I have reviewed notes per Ms. Veva dated 11/17/2024  Patient/Guardian was advised Release of Information must be obtained prior to any record release in order to collaborate their care with an outside provider. Patient/Guardian was advised if they have not already done so to contact the registration department to sign all necessary forms in order for us  to release information regarding their care.   Consent: Patient/Guardian gives verbal consent for treatment and assignment of benefits for services provided during this visit. Patient/Guardian expressed understanding and agreed to proceed.   This note was generated in part or whole with voice recognition software. Voice recognition is usually quite accurate but there are transcription errors that can and very often do occur. I apologize for any typographical errors that were not detected and corrected.    Bastian Andreoli, MD 11/21/2024, 6:46 AM

## 2024-11-22 ENCOUNTER — Other Ambulatory Visit: Payer: Self-pay | Admitting: Nurse Practitioner

## 2024-11-22 DIAGNOSIS — Z6838 Body mass index (BMI) 38.0-38.9, adult: Secondary | ICD-10-CM

## 2024-11-23 NOTE — Telephone Encounter (Signed)
 Requested Prescriptions  Pending Prescriptions Disp Refills   Semaglutide ,0.25 or 0.5MG /DOS, (OZEMPIC , 0.25 OR 0.5 MG/DOSE,) 2 MG/3ML SOPN [Pharmacy Med Name: OZEMPIC  0.25-0.5 MG/DOSE PEN] 3 mL 0    Sig: INJECT 0.25MG  INTO THE SKIN ONE TIME PER WEEK     Endocrinology:  Diabetes - GLP-1 Receptor Agonists - semaglutide  Passed - 11/23/2024  9:47 AM      Passed - HBA1C in normal range and within 180 days    HbA1c POC (<> result, manual entry)  Date Value Ref Range Status  03/30/2021 5.2 4.0 - 5.6 % Final   Hgb A1c MFr Bld  Date Value Ref Range Status  10/27/2024 5.3 <5.7 % Final    Comment:    For the purpose of screening for the presence of diabetes: . <5.7%       Consistent with the absence of diabetes 5.7-6.4%    Consistent with increased risk for diabetes             (prediabetes) > or =6.5%  Consistent with diabetes . This assay result is consistent with a decreased risk of diabetes. . Currently, no consensus exists regarding use of hemoglobin A1c for diagnosis of diabetes in children. . According to American Diabetes Association (ADA) guidelines, hemoglobin A1c <7.0% represents optimal control in non-pregnant diabetic patients. Different metrics may apply to specific patient populations.  Standards of Medical Care in Diabetes(ADA). .          Passed - Cr in normal range and within 360 days    Creat  Date Value Ref Range Status  10/27/2024 0.82 0.50 - 1.03 mg/dL Final   Creatinine, Urine  Date Value Ref Range Status  06/16/2024 156 20 - 275 mg/dL Final         Passed - Valid encounter within last 6 months    Recent Outpatient Visits           3 weeks ago Hyperlipidemia, unspecified hyperlipidemia type   [facility] Gareth Mliss FALCON, FNP   3 months ago Swelling of left lower extremity   [facility] Leavy Mole, PA-C   5 months ago Encounter for medical examination to establish care   [facility] Leavy Mole, PA-C

## 2024-11-29 ENCOUNTER — Ambulatory Visit

## 2024-11-29 VITALS — BP 179/100 | HR 93 | Wt 244.9 lb

## 2024-11-29 DIAGNOSIS — Z3042 Encounter for surveillance of injectable contraceptive: Secondary | ICD-10-CM

## 2024-11-29 MED ORDER — MEDROXYPROGESTERONE ACETATE 150 MG/ML IM SUSY
150.0000 mg | PREFILLED_SYRINGE | Freq: Once | INTRAMUSCULAR | Status: AC
  Administered 2024-11-29: 150 mg via INTRAMUSCULAR

## 2024-11-29 NOTE — Progress Notes (Addendum)
    NURSE VISIT NOTE  Subjective:    Patient ID: Mary Koch, female    DOB: [birth year], 53 y.o.   MRN: [id number]  HPI  Patient is a 53 y.o. G57P2002 female who presents for depo provera  injection.   Objective:    BP (!) 179/100   Pulse 93   Wt 244 lb 14.4 oz (111.1 kg)   BMI 39.53 kg/m   Last Annual: 08/05/23. Last pap: 06/06/22. Last Depo-Provera : 09/06/24. Side Effects if any: none. Serum HCG indicated? No . Depo-Provera  150 mg IM given by: Burnard Ro, CMA. Site: Right Deltoid     Assessment:   1. Encounter for Depo-Provera  contraception      Plan:   Next appointment due between Feb 16th and March 2nd .    Burnard LITTIE Ro, CMA

## 2024-12-02 ENCOUNTER — Ambulatory Visit: Admitting: Professional Counselor

## 2024-12-02 DIAGNOSIS — F410 Panic disorder [episodic paroxysmal anxiety] without agoraphobia: Secondary | ICD-10-CM

## 2024-12-02 DIAGNOSIS — F4000 Agoraphobia, unspecified: Secondary | ICD-10-CM

## 2024-12-02 DIAGNOSIS — F331 Major depressive disorder, recurrent, moderate: Secondary | ICD-10-CM

## 2024-12-02 NOTE — Progress Notes (Signed)
  THERAPIST PROGRESS NOTE  Session Time: 2:02 PM - 2:45 PM   Participation Level: Active  Behavioral Response: Casual, Alert, Dysphoric  Type of Therapy: Individual Therapy  Treatment Goals addressed: Active OP Anxiety  LTG: I want to be able to go out in public, take my yougins places. Anxiety and panic attacks. (Progressing)                Start:  06/21/24    Expected End:  06/20/25    Goal Note Continues to be relevant goal to work on   STG: To reduce symptoms of anxiety AEB reduction in GAD7 scores by using coping mechanisms over the next 90 days.  (Progressing)  Goal Note Reviewed 09/28/24 - Right now it's rough. I've been waking up around 1-3 in the morning. I feel like I can't even breath. GAD7 score reduced by 2 points but is still in severe range   STG: I want to go grocery shopping without worrying about someone staring or judging you. To increase daily activities AEB restructuring maladaptive patterns of thinking and exposure exercises over the next 90 days.  (Progressing)  Goal Note Reviewed 09/28/24 - You've helped me, them breathing exercises, like blowing out the candles, helps a whole lot. I guess I need to do it more.   STG: Well you can't bring my brother and my daddy back. To reduce impact of grief/loss AEB movement through stages of grief and coming to a place of acceptance over the next 12 weeks.  (Not Progressing)  Goal Note Reviewed 09/28/24 - That needs more work.   ProgressTowards Goals: Progressing  Interventions: Motivational Interviewing and Supportive  Summary: Mary Koch is a 53 y.o. female who presents with a history of panic disorder, agoraphobia, and depression. She appeared somber but oriented x5. She stated they had Thanksgiving at her ex-husband's house. She reported she hasn't looked into making connections with other people. She reported she would rather be alone forever. She engaged in discussion about these thoughts/feelings. Mary Koch  was open to looking at websites/apps to make connections with others. She also gained an opportunity for exposure exercise when her son called to ask her to stop at the store on the way home.  Therapist Response: Conducted session with Mary Koch. Began session with check-in/update since previous session. Utilized empathetic and reflective listening. Used open-ended questions to facilitate discussion and summarized Mary Koch's thoughts/feelings. Explored obstacles to trying to make connections with others and identified another step to work towards that goal. Highlighted an opportunity for exposure when Mary Koch's son called at the end of session. Scheduled additional appointment and concluded session.   Suicidal/Homicidal: No  Plan: Return again in 2 weeks.  Diagnosis: Agoraphobia  Panic disorder  MDD (major depressive disorder), recurrent episode, moderate (HCC)  Collaboration of Care: Medication Management AEB chart review  Patient/Guardian was advised Release of Information must be obtained prior to any record release in order to collaborate their care with an outside provider. Patient/Guardian was advised if they have not already done so to contact the registration department to sign all necessary forms in order for us  to release information regarding their care.   Consent: Patient/Guardian gives verbal consent for treatment and assignment of benefits for services provided during this visit. Patient/Guardian expressed understanding and agreed to proceed.   Mary Koch, [facility] 12/02/2024

## 2024-12-03 ENCOUNTER — Other Ambulatory Visit: Payer: Self-pay | Admitting: Psychiatry

## 2024-12-03 DIAGNOSIS — F331 Major depressive disorder, recurrent, moderate: Secondary | ICD-10-CM

## 2024-12-14 ENCOUNTER — Ambulatory Visit (INDEPENDENT_AMBULATORY_CARE_PROVIDER_SITE_OTHER): Admitting: Professional Counselor

## 2024-12-14 DIAGNOSIS — F331 Major depressive disorder, recurrent, moderate: Secondary | ICD-10-CM

## 2024-12-14 DIAGNOSIS — F4000 Agoraphobia, unspecified: Secondary | ICD-10-CM

## 2024-12-14 NOTE — Progress Notes (Signed)
 THERAPIST PROGRESS NOTE  Session Time: 2:01 PM - 2:46 PM   Participation Level: Active  Behavioral Response: Casual, Alert, Anxious and Dysphoric  Type of Therapy: Individual Therapy  Treatment Goals addressed: Active OP Anxiety  LTG: I want to be able to go out in public, take my yougins places. Anxiety and panic attacks. (Progressing)                Start:  06/21/24    Expected End:  06/20/25    Goal Note Continues to be relevant goal to work on   STG: To reduce symptoms of anxiety AEB reduction in GAD7 scores by using coping mechanisms over the next 90 days.  (Progressing)  Goal Note Reviewed 09/28/24 - Right now it's rough. I've been waking up around 1-3 in the morning. I feel like I can't even breath. GAD7 score reduced by 2 points but is still in severe range   STG: I want to go grocery shopping without worrying about someone staring or judging you. To increase daily activities AEB restructuring maladaptive patterns of thinking and exposure exercises over the next 90 days.  (Progressing)  Goal Note Reviewed 09/28/24 - You've helped me, them breathing exercises, like blowing out the candles, helps a whole lot. I guess I need to do it more.   STG: Well you can't bring my brother and my daddy back. To reduce impact of grief/loss AEB movement through stages of grief and coming to a place of acceptance over the next 12 weeks.  (Not Progressing)  Goal Note Reviewed 09/28/24 - That needs more work.   ProgressTowards Goals: Progressing  Interventions: CBT, Motivational Interviewing, Conservator, Museum/gallery, and Supportive  Summary: Deziya Amero is a 53 y.o. female who presents with a history of anxiety, depression, panic, and agoraphobia. She appeared somber but oriented x5. She reported she had to take her son to the emergency room a couple more times. She noted it's likely just anxiety but he won't take medication or go to therapy. She was receptive to handouts she can  provide to see if he will try some coping skills. Jermiah was also receptive to cycle of anxiety that may show up with reassurance seeking or other safety behaviors. She discussed the holidays and her dread of visiting family. She feels like she doesn't have a choice with visiting family, but was open to the idea of limiting time with them or communicating differently when they engage in problematic behavior. Melisse forgot the website provided to help with making connections. She took note of it and additional apps to look for romantic connections. She may also look into volunteering at the animal shelter.   Therapist Response: Conducted session with [name]. Began session with check-in/update since previous session. Utilized empathetic and reflective listening. Used open-ended questions to facilitate discussion and summarized Nilza's thoughts/feelings. Provided copies of coping skills and psychoeducation on cycle of anxiety. Processed Takeya's feelings about the holidays and reminded her of what she's in control over. Modeled ways to respond assertively to negative behavior. Explored ways for making connections with other including romantic connections and volunteering. Scheduled additional appointment and concluded session.   Suicidal/Homicidal: No  Plan: Return again in 3 weeks.  Diagnosis: Agoraphobia  MDD (major depressive disorder), recurrent episode, moderate (HCC)  Collaboration of Care: Medication Management AEB chart review  Patient/Guardian was advised Release of Information must be obtained prior to any record release in order to collaborate their care with an outside provider. Patient/Guardian was advised if they  have not already done so to contact the registration department to sign all necessary forms in order for us  to release information regarding their care.   Consent: Patient/Guardian gives verbal consent for treatment and assignment of benefits for services provided during this visit.  Patient/Guardian expressed understanding and agreed to proceed.   [name], [facility] 12/14/2024

## 2025-01-01 ENCOUNTER — Other Ambulatory Visit: Payer: Self-pay | Admitting: Nurse Practitioner

## 2025-01-01 ENCOUNTER — Other Ambulatory Visit: Payer: Self-pay | Admitting: Psychiatry

## 2025-01-01 DIAGNOSIS — Z6838 Body mass index (BMI) 38.0-38.9, adult: Secondary | ICD-10-CM

## 2025-01-01 DIAGNOSIS — F17200 Nicotine dependence, unspecified, uncomplicated: Secondary | ICD-10-CM

## 2025-01-03 NOTE — Telephone Encounter (Signed)
 Requested Prescriptions  Pending Prescriptions Disp Refills   OZEMPIC , 0.25 OR 0.5 MG/DOSE, 2 MG/3ML SOPN [Pharmacy Med Name: OZEMPIC  0.25-0.5 MG/DOSE PEN] 3 mL 0    Sig: INJECT 0.25 MG SUBCUTANEOUSLY ONE TIME PER WEEK     Endocrinology:  Diabetes - GLP-1 Receptor Agonists - semaglutide  Passed - 01/03/2025  4:22 PM      Passed - HBA1C in normal range and within 180 days    HbA1c POC (<> result, manual entry)  Date Value Ref Range Status  03/30/2021 5.2 4.0 - 5.6 % Final   Hgb A1c MFr Bld  Date Value Ref Range Status  10/27/2024 5.3 <5.7 % Final    Comment:    For the purpose of screening for the presence of diabetes: . <5.7%       Consistent with the absence of diabetes 5.7-6.4%    Consistent with increased risk for diabetes             (prediabetes) > or =6.5%  Consistent with diabetes . This assay result is consistent with a decreased risk of diabetes. . Currently, no consensus exists regarding use of hemoglobin A1c for diagnosis of diabetes in children. . According to American Diabetes Association (ADA) guidelines, hemoglobin A1c <7.0% represents optimal control in non-pregnant diabetic patients. Different metrics may apply to specific patient populations.  Standards of Medical Care in Diabetes(ADA). .          Passed - Cr in normal range and within 360 days    Creat  Date Value Ref Range Status  10/27/2024 0.82 0.50 - 1.03 mg/dL Final   Creatinine, Urine  Date Value Ref Range Status  06/16/2024 156 20 - 275 mg/dL Final         Passed - Valid encounter within last 6 months    Recent Outpatient Visits           2 months ago Hyperlipidemia, unspecified hyperlipidemia type   [facility] Gareth Mliss FALCON, FNP   4 months ago Swelling of left lower extremity   [facility] Leavy Mole, PA-C   6 months ago Encounter for medical examination to establish care   [facility] Leavy Mole,  PA-C

## 2025-01-04 ENCOUNTER — Ambulatory Visit: Admitting: Professional Counselor

## 2025-01-05 ENCOUNTER — Ambulatory Visit: Payer: Self-pay

## 2025-01-05 NOTE — Telephone Encounter (Signed)
 FYI Only or Action Required?: FYI only for provider: appointment scheduled on 01/07/24.  Patient was last seen in primary care on 10/27/2024 by Gareth Mliss FALCON, FNP.  Called Nurse Triage reporting Shortness of Breath.  Symptoms began several months ago.  Interventions attempted: Prescription medications: albuterol  inhaler and nebulizer.  Symptoms are: unchanged.  Triage Disposition: See PCP When Office is Open (Within 3 Days)  Patient/caregiver understands and will follow disposition?: Yes   Copied from CRM #[id number]. Topic: Clinical - Red Word Triage >> Jan 05, 2025  3:58 PM Montie POUR wrote: Red Word that prompted transfer to Nurse Triage:  She is having breathing issues; she does smoke; it feels like anxiety; when breathing, it feels like something is stuck and she is nauseated. This has been going on for a couple of months and getting worse   ----------------------------------------------------------------------- >> Jan 05, 2025  4:03 PM Montie POUR wrote: Please call her back at [phone]. She did not want to hold for triage nurse Reason for Disposition  [1] MODERATE longstanding difficulty breathing (e.g., speaks in phrases, SOB even at rest, pulse 100-120) AND [2] SAME as normal  Answer Assessment - Initial Assessment Questions Patient reports having off and SOB since 05-17-2024. She says she doesn't know if it's anxiety from the death of her brother in May 17, 2025 or something else. She says she's used her albuterol  inhaler and nebulizer, oxygen level on room air 97%, unable to lay flat (not new, never sleeps lying flat), swelling to the left lower leg (already saw provider about this). Denies CP. Reports having nausea. She says she's out of her anxiety medication and usually that helps the nausea and the SOB some. Advised TOC appointment with another provider, scheduled tomorrow.   1. RESPIRATORY STATUS: Describe your breathing? (e.g., wheezing, shortness of breath, unable to speak,  severe coughing)      SOB  2. ONSET: When did this breathing problem begin?      May 17, 2024 3. PATTERN Does the difficult breathing come and go, or has it been constant since it started?      Comes and goes  4. SEVERITY: How bad is your breathing? (e.g., mild, moderate, severe)      Mild-Moderate  5. RECURRENT SYMPTOM: Have you had difficulty breathing before? If Yes, ask: When was the last time? and What happened that time?      No  6. CARDIAC HISTORY: Do you have any history of heart disease? (e.g., heart attack, angina, bypass surgery, angioplasty)      No  7. LUNG HISTORY: Do you have any history of lung disease?  (e.g., pulmonary embolus, asthma, emphysema)     No  8. CAUSE: What do you think is causing the breathing problem?      Maybe anxiety, not sure  9. OTHER SYMPTOMS: Do you have any other symptoms? (e.g., chest pain, cough, dizziness, fever, runny nose)     Nausea  10. O2 SATURATION MONITOR:  Do you use an oxygen saturation monitor (pulse oximeter) at home? If Yes, ask: What is your reading (oxygen level) today? What is your usual oxygen saturation reading? (e.g., 95%)       97%  12. TRAVEL: Have you traveled out of the country in the last month? (e.g., travel history, exposures)       No  Protocols used: Breathing Difficulty-A-AH

## 2025-01-06 ENCOUNTER — Encounter: Payer: Self-pay | Admitting: Internal Medicine

## 2025-01-06 ENCOUNTER — Ambulatory Visit (INDEPENDENT_AMBULATORY_CARE_PROVIDER_SITE_OTHER): Admitting: Internal Medicine

## 2025-01-06 ENCOUNTER — Other Ambulatory Visit: Payer: Self-pay

## 2025-01-06 VITALS — BP 130/80 | HR 94 | Temp 98.5°F | Resp 16 | Ht 66.5 in | Wt 246.1 lb

## 2025-01-06 DIAGNOSIS — F17200 Nicotine dependence, unspecified, uncomplicated: Secondary | ICD-10-CM

## 2025-01-06 DIAGNOSIS — R0602 Shortness of breath: Secondary | ICD-10-CM

## 2025-01-06 DIAGNOSIS — Z87898 Personal history of other specified conditions: Secondary | ICD-10-CM | POA: Diagnosis not present

## 2025-01-06 DIAGNOSIS — F419 Anxiety disorder, unspecified: Secondary | ICD-10-CM

## 2025-01-06 MED ORDER — ALBUTEROL SULFATE (2.5 MG/3ML) 0.083% IN NEBU: 2.5000 mg | INHALATION_SOLUTION | RESPIRATORY_TRACT | 1 refills | Status: AC | PRN

## 2025-01-06 MED ORDER — ALBUTEROL SULFATE HFA 108 (90 BASE) MCG/ACT IN AERS: INHALATION_SPRAY | RESPIRATORY_TRACT | 5 refills | Status: AC

## 2025-01-06 NOTE — Progress Notes (Signed)
 "  Acute Office Visit  Subjective:     Patient ID: Mary Koch, female    DOB: 1971/03/14, 54 y.o.   MRN: [id number]  Chief Complaint  Patient presents with   Shortness of Breath    On and off for 6 months    Shortness of Breath Pertinent negatives include no fever, sputum production or wheezing.   Patient is in today for shortness of breath x 6 months.   Discussed the use of AI scribe software for clinical note transcription with the patient, who gave verbal consent to proceed.  History of Present Illness Mary Koch is a 54 year old female who presents with shortness of breath.  She reports 6 months of intermittent shortness of breath described as her breath getting caught, occurring with activity and at rest. Symptoms are worse in the early morning, sometimes waking her at 2-3 AM. Prior sleep apnea screening was negative.  She has used Ventolin  for the past 3 days, up to twice daily, with relief. She uses Breo Ellipta  every morning. She has increased mucus but denies wheezing or congestion. She has a smoking history and has recently cut back.  She has anxiety and is currently out of clonazepam . She feels anxiety contributes to her breathing difficulty, particularly after a recent significant life event.  She was diagnosed with prediabetes and previously used Ozempic  for glycemic control and weight loss. Her last A1c was 5.3. She notes fasting blood sugars around 148 mg/dL but has not been diagnosed with diabetes. She stopped Ozempic  due to bloating and nausea.   Review of Systems  Constitutional:  Negative for chills and fever.  HENT:  Negative for congestion and sinus pain.   Respiratory:  Positive for cough and shortness of breath. Negative for sputum production and wheezing.         Objective:    BP 130/80 (Cuff Size: Large)   Pulse 94   Temp 98.5 F (36.9 C) (Oral)   Resp 16   Ht 5' 6.5 (1.689 m)   Wt 246 lb 1.6 oz (111.6 kg)   SpO2 98%   BMI  39.13 kg/m  BP Readings from Last 3 Encounters:  01/06/25 130/80  11/29/24 (!) 179/100  10/27/24 138/82   Wt Readings from Last 3 Encounters:  01/06/25 246 lb 1.6 oz (111.6 kg)  11/29/24 244 lb 14.4 oz (111.1 kg)  10/27/24 251 lb (113.9 kg)      Physical Exam Constitutional:      Appearance: Normal appearance. She is well-developed.  HENT:     Head: Normocephalic and atraumatic.  Cardiovascular:     Rate and Rhythm: Normal rate and regular rhythm.  Pulmonary:     Effort: Pulmonary effort is normal.     Breath sounds: Normal breath sounds. No wheezing, rhonchi or rales.  Skin:    General: Skin is warm and dry.  Neurological:     General: No focal deficit present.     Mental Status: She is alert. Mental status is at baseline.  Psychiatric:        Mood and Affect: Mood normal.        Behavior: Behavior normal.     No results found for any visits on 01/06/25.      Assessment & Plan:   Assessment & Plan Chronic respiratory symptoms, possible COPD Chronic respiratory symptoms with shortness of breath for six months. Differential includes COPD, asthma, and sleep apnea. Smoking history present. Nocturnal symptoms suggest possible sleep apnea. -  Ordered pulmonary function test (PFT) to evaluate for COPD. - Refilled albuterol  inhaler and nebulizer solution. - Continue Breo Ellipta  daily and rinse mouth after use to prevent oral thrush. - Discussed potential benefits of a maintenance inhaler. - Provided information on pneumonia vaccine to reduce risk of severe pneumonia.  Anxiety disorder Anxiety symptoms potentially contributing to respiratory symptoms. Anxiety may be exacerbated by recent life events. Nocturnal symptoms may not be solely due to anxiety. - Discuss anxiety management with psychiatrist, Dr. Magda. - Consider alternative anxiety medications such as buspirone or hydroxyzine .  Hx of Prediabetes Previous A1c of 6.0. Current A1c is 5.3, indicating normal glucose  control. Reports fasting blood sugars around 148-150 mg/dL, slightly elevated but not diagnostic of diabetes. - Discontinued Ozempic  due to side effects and lack of medical necessity. - Continue monitoring blood glucose levels.  General Health Maintenance Discussed pneumonia vaccine to reduce risk of severe pneumonia, especially given frequent infections and outdoor exposure. - Provided information on pneumonia vaccine for consideration.  - albuterol  (VENTOLIN  HFA) 108 (90 Base) MCG/ACT inhaler; INHALE 1 TO 2 PUFFS BY MOUTH DAILY  Dispense: 6.7 each; Refill: 5 - albuterol  (PROVENTIL ) (2.5 MG/3ML) 0.083% nebulizer solution; Take 3 mLs (2.5 mg total) by nebulization every 4 (four) hours as needed for wheezing or shortness of breath.  Dispense: 150 mL; Refill: 1 - Pulmonary function test; Future   Return in about 4 weeks (around 02/03/2025).  Sharyle Fischer, DO   "

## 2025-01-06 NOTE — Patient Instructions (Signed)
Pneumococcal Conjugate Vaccine (PCV20) Injection What is this medication? PNEUMOCOCCAL CONJUGATE VACCINE (NEU mo KOK al kon ju gate vak SEEN) reduces the risk of pneumococcal disease, such as pneumonia. It does not treat pneumococcal disease. It is still possible to get pneumococcal disease after receiving this vaccine, but the symptoms may be less severe or not last as long. It works by helping your immune system learn how to fight off a future infection. This medicine may be used for other purposes; ask your health care provider or pharmacist if you have questions. COMMON BRAND NAME(S): Prevnar 20 What should I tell my care team before I take this medication? They need to know if you have any of these conditions: Bleeding disorder Fever Immune system problems An unusual or allergic reaction to pneumococcal vaccine, diphtheria toxoid, other vaccines, other medications, foods, dyes, or preservatives Pregnant or trying to get pregnant Breastfeeding How should I use this medication? This vaccine is injected into a muscle. It is given by your care team. A copy of Vaccine Information Statements will be given before each vaccination. Be sure to read this information carefully each time. This sheet may change often. Talk to your care team about the use of this medication in children. While it may be given to children as young as 6 weeks for selected conditions, precautions do apply. Overdosage: If you think you have taken too much of this medicine contact a poison control center or emergency room at once. NOTE: This medicine is only for you. Do not share this medicine with others. What if I miss a dose? This does not apply. This medication is not for regular use. What may interact with this medication? Medications for cancer chemotherapy Medications that suppress your immune function Steroid medications, such as prednisone or cortisone This list may not describe all possible interactions. Give  your health care provider a list of all the medicines, herbs, non-prescription drugs, or dietary supplements you use. Also tell them if you smoke, drink alcohol, or use illegal drugs. Some items may interact with your medicine. What should I watch for while using this medication? Visit your care team regularly. Report any side effects to your care team right away. This vaccine, like all vaccines, may not fully protect everyone. What side effects may I notice from receiving this medication? Side effects that you should report to your care team as soon as possible: Allergic reactions--skin rash, itching, hives, swelling of the face, lips, tongue, or throat Side effects that usually do not require medical attention (report these to your care team if they continue or are bothersome): Fatigue Fever Headache Joint pain Muscle pain Pain, redness, or irritation at injection site This list may not describe all possible side effects. Call your doctor for medical advice about side effects. You may report side effects to FDA at 1-800-FDA-1088. Where should I keep my medication? This vaccine is only given by your care team. It will not be stored at home. NOTE: This sheet is a summary. It may not cover all possible information. If you have questions about this medicine, talk to your doctor, pharmacist, or health care provider.  2024 Elsevier/Gold Standard (2022-05-29 00:00:00)

## 2025-01-18 ENCOUNTER — Ambulatory Visit (INDEPENDENT_AMBULATORY_CARE_PROVIDER_SITE_OTHER): Admitting: Professional Counselor

## 2025-01-18 DIAGNOSIS — F331 Major depressive disorder, recurrent, moderate: Secondary | ICD-10-CM

## 2025-01-18 DIAGNOSIS — F410 Panic disorder [episodic paroxysmal anxiety] without agoraphobia: Secondary | ICD-10-CM | POA: Diagnosis not present

## 2025-01-18 DIAGNOSIS — F4000 Agoraphobia, unspecified: Secondary | ICD-10-CM | POA: Diagnosis not present

## 2025-01-18 NOTE — Progress Notes (Signed)
" °  THERAPIST PROGRESS NOTE  Session Time: 11:03 AM - 11:36 AM   Participation Level: Active  Behavioral Response: Casual, Alert, Anxious  Type of Therapy: Individual Therapy  Treatment Goals addressed:  Active OP Anxiety  LTG: I want to be able to go out in public, take my yougins places. Anxiety and panic attacks. (Progressing)                Start:  06/21/24    Expected End:  06/20/25    Goal Note Continues to be relevant goal to work on   STG: To reduce symptoms of anxiety AEB reduction in GAD7 scores by using coping mechanisms over the next 90 days.  (Progressing)  Goal Note Reviewed 09/28/24 - Right now it's rough. I've been waking up around 1-3 in the morning. I feel like I can't even breath. GAD7 score reduced by 2 points but is still in severe range   STG: I want to go grocery shopping without worrying about someone staring or judging you. To increase daily activities AEB restructuring maladaptive patterns of thinking and exposure exercises over the next 90 days.  (Progressing)  Goal Note Reviewed 09/28/24 - You've helped me, them breathing exercises, like blowing out the candles, helps a whole lot. I guess I need to do it more.   STG: Well you can't bring my brother and my daddy back. To reduce impact of grief/loss AEB movement through stages of grief and coming to a place of acceptance over the next 12 weeks.  (Not Progressing)  Goal Note Reviewed 09/28/24 - That needs more work.   ProgressTowards Goals: Progressing  Interventions: CBT, Motivational Interviewing, and Supportive  Summary: Mary Koch is a 55 y.o. female who presents with a history of anxiety, depression, panic, and agoraphobia. She appeared somber but oriented x5. She stated the holidays were fine. They didn't stay at her ex-husband's family house for long. She reported no one talked to them so they left. They enjoyed the holiday at her mom's though. Mary Koch continues to have a fear of dying. She  noted she wakes up with anxiety although she is always thankful to still be alive. She was receptive to [employer] skill. Mary Koch reported she left [employer] before shopping because there were too many cars in the parking lot. She will attempt gradual exposure/urge surfing. She expressed a desire to see the monks walk for peace tomorrow.   Therapist Response: Conducted session with [name]. Began session with check-in/update since previous session. Utilized empathetic and reflective listening. Used open-ended questions to facilitate discussion and summarized Mary Koch's thoughts/feelings. Explored fear of dying. Explained TIP skill to reduce morning anxiety. Discussed gradual exposure and urge surfing for going out in public. Scheduled additional appointment and concluded session.   Suicidal/Homicidal: No  Plan: Return again in 2 weeks.  Diagnosis: Agoraphobia  MDD (major depressive disorder), recurrent episode, moderate (HCC)  Panic disorder  Collaboration of Care: Medication Management AEB chart review  Patient/Guardian was advised Release of Information must be obtained prior to any record release in order to collaborate their care with an outside provider. Patient/Guardian was advised if they have not already done so to contact the registration department to sign all necessary forms in order for us  to release information regarding their care.   Consent: Patient/Guardian gives verbal consent for treatment and assignment of benefits for services provided during this visit. Patient/Guardian expressed understanding and agreed to proceed.   Mary Koch, [facility] 01/18/2025  "

## 2025-02-03 ENCOUNTER — Ambulatory Visit: Admitting: Professional Counselor

## 2025-02-03 DIAGNOSIS — F4000 Agoraphobia, unspecified: Secondary | ICD-10-CM

## 2025-02-03 DIAGNOSIS — Z634 Disappearance and death of family member: Secondary | ICD-10-CM

## 2025-02-03 DIAGNOSIS — F410 Panic disorder [episodic paroxysmal anxiety] without agoraphobia: Secondary | ICD-10-CM

## 2025-02-03 NOTE — Progress Notes (Unsigned)
 " THERAPIST PROGRESS NOTE  Virtual Visit via Video Note  I connected with Mary Koch on 02/03/25 at  2:00 PM EST by a video enabled telemedicine application and verified that I am speaking with the correct person using two identifiers.  Location: Patient: Home Provider: Remote office   I discussed the limitations of evaluation and management by telemedicine and the availability of in person appointments. The patient expressed understanding and agreed to proceed.   I discussed the assessment and treatment plan with the patient. The patient was provided an opportunity to ask questions and all were answered. The patient agreed with the plan and demonstrated an understanding of the instructions.   The patient was advised to call back or seek an in-person evaluation if the symptoms worsen or if the condition fails to improve as anticipated.  I provided 29 minutes of non-face-to-face time during this encounter. Mary Koch, [facility]  Session Time: 2:00 PM - 2:29 PM  Participation Level: Active  Behavioral Response: Casual, Alert, Dysphoric  Type of Therapy: Individual Therapy  Treatment Goals addressed: Active OP Anxiety  LTG: I want to be able to go out in public, take my yougins places. Anxiety and panic attacks. (Progressing)                Start:  06/21/24    Expected End:  06/20/25    Goal Note Continues to be relevant goal to work on   STG: To reduce symptoms of anxiety AEB reduction in GAD7 scores by using coping mechanisms over the next 90 days.  (Progressing)  Goal Note Reviewed 09/28/24 - Right now it's rough. I've been waking up around 1-3 in the morning. I feel like I can't even breath. GAD7 score reduced by 2 points but is still in severe range   STG: I want to go grocery shopping without worrying about someone staring or judging you. To increase daily activities AEB restructuring maladaptive patterns of thinking and exposure exercises over the next 90 days.   (Progressing)  Goal Note Reviewed 09/28/24 - You've helped me, them breathing exercises, like blowing out the candles, helps a whole lot. I guess I need to do it more.   STG: Well you can't bring my brother and my daddy back. To reduce impact of grief/loss AEB movement through stages of grief and coming to a place of acceptance over the next 12 weeks.  (Not Progressing)  Goal Note Reviewed 09/28/24 - That needs more work.   ProgressTowards Goals: Progressing  Interventions: CBT, Motivational Interviewing, and Supportive  Summary: Mary Koch is a 54 y.o. female who presents with a history of depression, panic disorder, agoraphobia, and bereavement. She appeared somber but oriented x5. She .   Therapist Response: Conducted session with . Began session with check-in/update since previous session. Utilized empathetic and reflective listening. Used open-ended questions to facilitate discussion and summarized thoughts/feelings. Scheduled additional appointment and concluded session.   Suicidal/Homicidal: No  Plan: Return again in 1 week.  Diagnosis: Agoraphobia  Panic disorder  Bereavement  Collaboration of Care: Medication Management AEB chart review  Patient/Guardian was advised Release of Information must be obtained prior to any record release in order to collaborate their care with an outside provider. Patient/Guardian was advised if they have not already done so to contact the registration department to sign all necessary forms in order for us  to release information regarding their care.   Consent: Patient/Guardian gives verbal consent for treatment and assignment of benefits for services  provided during this visit. Patient/Guardian expressed understanding and agreed to proceed.   Mary Koch, [facility] 02/03/2025  "

## 2025-02-07 ENCOUNTER — Ambulatory Visit: Admitting: Professional Counselor

## 2025-02-07 ENCOUNTER — Ambulatory Visit: Admitting: Internal Medicine

## 2025-02-15 ENCOUNTER — Ambulatory Visit: Admitting: Psychiatry

## 2025-02-21 ENCOUNTER — Ambulatory Visit

## 2025-02-22 ENCOUNTER — Ambulatory Visit: Admitting: Professional Counselor

## 2025-07-07 ENCOUNTER — Ambulatory Visit
